# Patient Record
Sex: Female | Born: 1945
Health system: Southern US, Community
[De-identification: ages and names within clinical notes are randomized; demographics above are authoritative.]

## PROBLEM LIST (undated history)

## (undated) DIAGNOSIS — R519 Headache, unspecified: Secondary | ICD-10-CM

## (undated) DIAGNOSIS — M109 Gout, unspecified: Secondary | ICD-10-CM

## (undated) DIAGNOSIS — D649 Anemia, unspecified: Secondary | ICD-10-CM

## (undated) DIAGNOSIS — R51 Headache: Secondary | ICD-10-CM

## (undated) DIAGNOSIS — F319 Bipolar disorder, unspecified: Secondary | ICD-10-CM

## (undated) DIAGNOSIS — R0600 Dyspnea, unspecified: Secondary | ICD-10-CM

## (undated) DIAGNOSIS — G4700 Insomnia, unspecified: Secondary | ICD-10-CM

## (undated) DIAGNOSIS — H04123 Dry eye syndrome of bilateral lacrimal glands: Secondary | ICD-10-CM

## (undated) DIAGNOSIS — M1990 Unspecified osteoarthritis, unspecified site: Secondary | ICD-10-CM

## (undated) DIAGNOSIS — N8110 Cystocele, unspecified: Secondary | ICD-10-CM

## (undated) DIAGNOSIS — M48062 Spinal stenosis, lumbar region with neurogenic claudication: Secondary | ICD-10-CM

## (undated) DIAGNOSIS — F329 Major depressive disorder, single episode, unspecified: Secondary | ICD-10-CM

## (undated) DIAGNOSIS — J309 Allergic rhinitis, unspecified: Secondary | ICD-10-CM

## (undated) DIAGNOSIS — Z9682 Presence of neurostimulator: Secondary | ICD-10-CM

## (undated) DIAGNOSIS — I10 Essential (primary) hypertension: Secondary | ICD-10-CM

## (undated) DIAGNOSIS — J189 Pneumonia, unspecified organism: Secondary | ICD-10-CM

## (undated) DIAGNOSIS — Z8709 Personal history of other diseases of the respiratory system: Secondary | ICD-10-CM

## (undated) DIAGNOSIS — M8580 Other specified disorders of bone density and structure, unspecified site: Secondary | ICD-10-CM

## (undated) DIAGNOSIS — J45909 Unspecified asthma, uncomplicated: Secondary | ICD-10-CM

## (undated) DIAGNOSIS — I2510 Atherosclerotic heart disease of native coronary artery without angina pectoris: Secondary | ICD-10-CM

## (undated) DIAGNOSIS — Z62819 Personal history of unspecified abuse in childhood: Secondary | ICD-10-CM

## (undated) DIAGNOSIS — M47816 Spondylosis without myelopathy or radiculopathy, lumbar region: Secondary | ICD-10-CM

## (undated) DIAGNOSIS — N390 Urinary tract infection, site not specified: Secondary | ICD-10-CM

## (undated) DIAGNOSIS — M4310 Spondylolisthesis, site unspecified: Secondary | ICD-10-CM

## (undated) DIAGNOSIS — G2581 Restless legs syndrome: Secondary | ICD-10-CM

## (undated) DIAGNOSIS — N6019 Diffuse cystic mastopathy of unspecified breast: Secondary | ICD-10-CM

## (undated) DIAGNOSIS — K219 Gastro-esophageal reflux disease without esophagitis: Secondary | ICD-10-CM

## (undated) DIAGNOSIS — Z201 Contact with and (suspected) exposure to tuberculosis: Secondary | ICD-10-CM

## (undated) DIAGNOSIS — J849 Interstitial pulmonary disease, unspecified: Secondary | ICD-10-CM

## (undated) DIAGNOSIS — Z8719 Personal history of other diseases of the digestive system: Secondary | ICD-10-CM

## (undated) DIAGNOSIS — G9341 Metabolic encephalopathy: Secondary | ICD-10-CM

## (undated) DIAGNOSIS — G8918 Other acute postprocedural pain: Secondary | ICD-10-CM

## (undated) DIAGNOSIS — F419 Anxiety disorder, unspecified: Secondary | ICD-10-CM

## (undated) DIAGNOSIS — N952 Postmenopausal atrophic vaginitis: Secondary | ICD-10-CM

## (undated) DIAGNOSIS — E785 Hyperlipidemia, unspecified: Secondary | ICD-10-CM

## (undated) DIAGNOSIS — J449 Chronic obstructive pulmonary disease, unspecified: Secondary | ICD-10-CM

## (undated) DIAGNOSIS — M51369 Other intervertebral disc degeneration, lumbar region without mention of lumbar back pain or lower extremity pain: Secondary | ICD-10-CM

## (undated) DIAGNOSIS — N814 Uterovaginal prolapse, unspecified: Secondary | ICD-10-CM

## (undated) DIAGNOSIS — R1312 Dysphagia, oropharyngeal phase: Secondary | ICD-10-CM

## (undated) DIAGNOSIS — E039 Hypothyroidism, unspecified: Secondary | ICD-10-CM

## (undated) DIAGNOSIS — E46 Unspecified protein-calorie malnutrition: Secondary | ICD-10-CM

## (undated) DIAGNOSIS — C801 Malignant (primary) neoplasm, unspecified: Secondary | ICD-10-CM

## (undated) DIAGNOSIS — F32A Depression, unspecified: Secondary | ICD-10-CM

## (undated) DIAGNOSIS — E8809 Other disorders of plasma-protein metabolism, not elsewhere classified: Secondary | ICD-10-CM

## (undated) HISTORY — PX: JOINT REPLACEMENT: SHX530

## (undated) HISTORY — DX: Personal history of unspecified abuse in childhood: Z62.819

## (undated) HISTORY — DX: Personal history of other diseases of the respiratory system: Z87.09

## (undated) HISTORY — DX: Cystocele, unspecified: N81.10

## (undated) HISTORY — DX: Hyperlipidemia, unspecified: E78.5

## (undated) HISTORY — DX: Gout, unspecified: M10.9

## (undated) HISTORY — PX: SPINAL CORD STIMULATOR IMPLANT: SHX2422

## (undated) HISTORY — DX: Contact with and (suspected) exposure to tuberculosis: Z20.1

## (undated) HISTORY — PX: KNEE SURGERY: SHX244

## (undated) HISTORY — PX: BREAST RECONSTRUCTION: SHX9

## (undated) HISTORY — DX: Other acute postprocedural pain: G89.18

## (undated) HISTORY — DX: Urinary tract infection, site not specified: N39.0

## (undated) HISTORY — DX: Essential (primary) hypertension: I10

## (undated) HISTORY — DX: Restless legs syndrome: G25.81

## (undated) HISTORY — DX: Malignant (primary) neoplasm, unspecified: C80.1

---

## 1973-01-28 HISTORY — PX: VAGINAL HYSTERECTOMY: SUR661

## 1976-01-29 HISTORY — PX: AUGMENTATION MAMMAPLASTY: SUR837

## 1976-01-29 HISTORY — PX: OOPHORECTOMY: SHX86

## 1976-01-29 HISTORY — PX: MASTECTOMY PARTIAL / LUMPECTOMY: SUR851

## 1987-01-29 HISTORY — PX: CHOLECYSTECTOMY: SHX55

## 1988-01-29 HISTORY — PX: ELBOW ARTHROPLASTY: SHX928

## 1997-11-02 ENCOUNTER — Ambulatory Visit (HOSPITAL_COMMUNITY): Admission: RE | Admit: 1997-11-02 | Discharge: 1997-11-02 | Payer: Self-pay | Admitting: Internal Medicine

## 1999-01-29 HISTORY — PX: INCONTINENCE SURGERY: SHX676

## 1999-11-07 ENCOUNTER — Ambulatory Visit (HOSPITAL_COMMUNITY): Admission: RE | Admit: 1999-11-07 | Discharge: 1999-11-07 | Payer: Self-pay | Admitting: Internal Medicine

## 1999-11-07 ENCOUNTER — Encounter: Payer: Self-pay | Admitting: Internal Medicine

## 2000-11-19 ENCOUNTER — Ambulatory Visit (HOSPITAL_COMMUNITY): Admission: RE | Admit: 2000-11-19 | Discharge: 2000-11-19 | Payer: Self-pay | Admitting: Internal Medicine

## 2000-11-19 ENCOUNTER — Encounter: Payer: Self-pay | Admitting: Internal Medicine

## 2002-01-08 ENCOUNTER — Ambulatory Visit (HOSPITAL_COMMUNITY): Admission: RE | Admit: 2002-01-08 | Discharge: 2002-01-08 | Payer: Self-pay | Admitting: Internal Medicine

## 2002-01-08 ENCOUNTER — Encounter: Payer: Self-pay | Admitting: Internal Medicine

## 2003-08-11 ENCOUNTER — Ambulatory Visit (HOSPITAL_COMMUNITY): Admission: RE | Admit: 2003-08-11 | Discharge: 2003-08-11 | Payer: Self-pay | Admitting: Internal Medicine

## 2004-03-14 ENCOUNTER — Ambulatory Visit (HOSPITAL_COMMUNITY): Admission: RE | Admit: 2004-03-14 | Discharge: 2004-03-14 | Payer: Self-pay | Admitting: Internal Medicine

## 2004-03-29 ENCOUNTER — Ambulatory Visit: Payer: Self-pay | Admitting: Gastroenterology

## 2004-04-11 ENCOUNTER — Ambulatory Visit: Payer: Self-pay | Admitting: Gastroenterology

## 2005-04-05 ENCOUNTER — Ambulatory Visit (HOSPITAL_COMMUNITY): Admission: RE | Admit: 2005-04-05 | Discharge: 2005-04-05 | Payer: Self-pay | Admitting: Internal Medicine

## 2005-06-19 ENCOUNTER — Ambulatory Visit: Payer: Self-pay

## 2005-07-26 ENCOUNTER — Ambulatory Visit: Payer: Self-pay | Admitting: Family

## 2005-10-31 ENCOUNTER — Encounter: Admission: RE | Admit: 2005-10-31 | Discharge: 2005-10-31 | Payer: Self-pay | Admitting: Neurosurgery

## 2005-11-08 ENCOUNTER — Ambulatory Visit (HOSPITAL_COMMUNITY): Admission: RE | Admit: 2005-11-08 | Discharge: 2005-11-09 | Payer: Self-pay | Admitting: Neurosurgery

## 2006-03-10 ENCOUNTER — Ambulatory Visit: Payer: Self-pay | Admitting: Pain Medicine

## 2006-03-11 ENCOUNTER — Ambulatory Visit: Payer: Self-pay | Admitting: Pain Medicine

## 2006-03-25 ENCOUNTER — Ambulatory Visit: Payer: Self-pay | Admitting: Physician Assistant

## 2006-05-09 ENCOUNTER — Ambulatory Visit (HOSPITAL_COMMUNITY): Admission: RE | Admit: 2006-05-09 | Discharge: 2006-05-09 | Payer: Self-pay | Admitting: Family Medicine

## 2006-12-16 ENCOUNTER — Ambulatory Visit: Payer: Self-pay | Admitting: Pain Medicine

## 2006-12-31 ENCOUNTER — Ambulatory Visit: Payer: Self-pay | Admitting: Pain Medicine

## 2007-01-05 ENCOUNTER — Ambulatory Visit: Payer: Self-pay | Admitting: Pain Medicine

## 2007-01-08 ENCOUNTER — Ambulatory Visit: Payer: Self-pay | Admitting: Pain Medicine

## 2007-01-09 ENCOUNTER — Ambulatory Visit: Payer: Self-pay | Admitting: Pain Medicine

## 2007-01-29 HISTORY — PX: RECTOCELE REPAIR: SHX761

## 2007-02-02 ENCOUNTER — Ambulatory Visit: Payer: Self-pay | Admitting: Physician Assistant

## 2007-03-18 ENCOUNTER — Ambulatory Visit: Payer: Self-pay | Admitting: Physician Assistant

## 2007-03-31 ENCOUNTER — Ambulatory Visit: Payer: Self-pay | Admitting: Pain Medicine

## 2007-04-14 ENCOUNTER — Ambulatory Visit: Payer: Self-pay | Admitting: Physician Assistant

## 2007-08-11 ENCOUNTER — Ambulatory Visit: Payer: Self-pay | Admitting: Family Medicine

## 2007-08-17 ENCOUNTER — Emergency Department: Payer: Self-pay | Admitting: Emergency Medicine

## 2007-08-17 ENCOUNTER — Other Ambulatory Visit: Payer: Self-pay

## 2007-09-02 ENCOUNTER — Ambulatory Visit: Payer: Self-pay | Admitting: Family Medicine

## 2007-09-09 ENCOUNTER — Ambulatory Visit: Payer: Self-pay | Admitting: Obstetrics & Gynecology

## 2007-10-06 ENCOUNTER — Ambulatory Visit: Payer: Self-pay | Admitting: Gynecology

## 2007-10-21 ENCOUNTER — Ambulatory Visit: Payer: Self-pay | Admitting: Gynecology

## 2007-10-27 ENCOUNTER — Ambulatory Visit: Payer: Self-pay | Admitting: Obstetrics & Gynecology

## 2007-10-27 ENCOUNTER — Inpatient Hospital Stay (HOSPITAL_COMMUNITY): Admission: RE | Admit: 2007-10-27 | Discharge: 2007-10-28 | Payer: Self-pay | Admitting: Obstetrics & Gynecology

## 2007-11-17 ENCOUNTER — Ambulatory Visit: Payer: Self-pay | Admitting: Obstetrics & Gynecology

## 2007-12-22 ENCOUNTER — Ambulatory Visit: Payer: Self-pay | Admitting: Obstetrics & Gynecology

## 2008-04-04 ENCOUNTER — Ambulatory Visit: Payer: Self-pay | Admitting: Pain Medicine

## 2008-04-19 ENCOUNTER — Ambulatory Visit: Payer: Self-pay | Admitting: Pain Medicine

## 2008-05-05 ENCOUNTER — Ambulatory Visit: Payer: Self-pay | Admitting: Physician Assistant

## 2008-06-09 ENCOUNTER — Ambulatory Visit: Payer: Self-pay | Admitting: Physician Assistant

## 2008-06-30 ENCOUNTER — Ambulatory Visit: Payer: Self-pay | Admitting: Pain Medicine

## 2008-08-03 ENCOUNTER — Ambulatory Visit: Payer: Self-pay | Admitting: Physician Assistant

## 2008-08-16 ENCOUNTER — Ambulatory Visit: Payer: Self-pay | Admitting: Family Medicine

## 2008-08-18 ENCOUNTER — Ambulatory Visit: Payer: Self-pay | Admitting: Physician Assistant

## 2008-08-29 ENCOUNTER — Ambulatory Visit: Payer: Self-pay | Admitting: Family Medicine

## 2008-09-06 ENCOUNTER — Ambulatory Visit: Payer: Self-pay | Admitting: Physician Assistant

## 2008-09-15 ENCOUNTER — Ambulatory Visit: Payer: Self-pay | Admitting: Physician Assistant

## 2008-09-21 ENCOUNTER — Ambulatory Visit: Payer: Self-pay | Admitting: Family Medicine

## 2008-10-18 ENCOUNTER — Ambulatory Visit: Payer: Self-pay | Admitting: Pain Medicine

## 2008-11-03 ENCOUNTER — Ambulatory Visit: Payer: Self-pay | Admitting: Physician Assistant

## 2009-02-09 ENCOUNTER — Ambulatory Visit: Payer: Self-pay | Admitting: Physician Assistant

## 2009-03-03 ENCOUNTER — Encounter (INDEPENDENT_AMBULATORY_CARE_PROVIDER_SITE_OTHER): Payer: Self-pay | Admitting: *Deleted

## 2009-05-16 ENCOUNTER — Ambulatory Visit: Payer: Self-pay | Admitting: Pain Medicine

## 2009-06-09 ENCOUNTER — Ambulatory Visit: Payer: Self-pay | Admitting: Pain Medicine

## 2009-07-10 ENCOUNTER — Ambulatory Visit: Payer: Self-pay | Admitting: Pain Medicine

## 2009-07-18 ENCOUNTER — Ambulatory Visit: Payer: Self-pay | Admitting: Pain Medicine

## 2009-08-09 ENCOUNTER — Ambulatory Visit: Payer: Self-pay | Admitting: Pain Medicine

## 2009-08-22 ENCOUNTER — Ambulatory Visit: Payer: Self-pay | Admitting: Pain Medicine

## 2009-09-01 ENCOUNTER — Ambulatory Visit: Payer: Self-pay | Admitting: Family Medicine

## 2009-09-18 ENCOUNTER — Ambulatory Visit: Payer: Self-pay | Admitting: Pain Medicine

## 2009-10-26 ENCOUNTER — Ambulatory Visit: Payer: Self-pay | Admitting: Pain Medicine

## 2009-11-01 ENCOUNTER — Telehealth: Payer: Self-pay | Admitting: Gastroenterology

## 2009-11-06 ENCOUNTER — Ambulatory Visit: Payer: Self-pay | Admitting: Pain Medicine

## 2009-11-14 ENCOUNTER — Ambulatory Visit: Payer: Self-pay | Admitting: Pain Medicine

## 2009-12-25 ENCOUNTER — Ambulatory Visit: Payer: Self-pay | Admitting: Pain Medicine

## 2009-12-28 ENCOUNTER — Ambulatory Visit: Payer: Self-pay | Admitting: Pain Medicine

## 2010-02-05 ENCOUNTER — Ambulatory Visit: Payer: Self-pay | Admitting: Pain Medicine

## 2010-03-01 NOTE — Letter (Signed)
Summary: Colonoscopy Letter  [facility]  [address] [place], [place] [address]   Phone: [phone]  Fax: [phone]      March 03, 2009 MRN: [id number]   JOEE IOVINE [address] [place], [place]  [address]   Dear Ms. Grieves,   According to your medical record, it is time for you to schedule a Colonoscopy. The American Cancer Society recommends this procedure as a method to detect early colon cancer. Patients with a family history of colon cancer, or a personal history of colon polyps or inflammatory bowel disease are at increased risk.  This letter has beeen generated based on the recommendations made at the time of your procedure. If you feel that in your particular situation this may no longer apply, please contact our office.  Please call our office at [phone] to schedule this appointment or to update your records at your earliest convenience.  Thank you for cooperating with [place] to provide you with the very best care possible.   Sincerely,  Vania Rea. Jarold Motto, M.D.  [facility] [phone]

## 2010-03-01 NOTE — Progress Notes (Signed)
Summary: Schedule Colonoscopy  Phone Note Outgoing Call Call back at Home Phone [phone]   Call placed by: Harlow Mares CMA Duncan Dull),  November 01, 2009 11:12 AM Call placed to: Patient Summary of Call: patient is aware she needs a colonoscopy but she just had back surgery and she took down the number to call back and schedule her colonoscopy.  Initial call taken by: Harlow Mares CMA (AAMA),  November 01, 2009 11:13 AM

## 2010-04-23 ENCOUNTER — Ambulatory Visit: Payer: Self-pay | Admitting: Pain Medicine

## 2010-05-02 ENCOUNTER — Ambulatory Visit: Payer: Self-pay | Admitting: Pain Medicine

## 2010-05-29 ENCOUNTER — Ambulatory Visit: Payer: Self-pay | Admitting: Pain Medicine

## 2010-06-12 NOTE — Op Note (Signed)
[name], [name] NO.:  [id number]   MEDICAL RECORD NO.:  [id number]          PATIENT TYPE:  POB   LOCATION:  [facility]                          FACILITY:  [facility]   PHYSICIAN:  Norton Blizzard, MD    DATE OF BIRTH:  03-10-1945   DATE OF PROCEDURE:  10/27/2007  DATE OF DISCHARGE:                               OPERATIVE REPORT   PREOPERATIVE DIAGNOSIS:  Symptomatic rectocele.   POSTOPERATIVE DIAGNOSIS:  Symptomatic rectocele.   PROCEDURE:  Posterior colporrhaphy.   SURGEON:  Norton Blizzard, MD   ASSISTANT:  Allie Bossier, MD   ANESTHESIA:  General.   IV FLUIDS:  2000 mL of lactated Ringer's.   ESTIMATED BLOOD LOSS:  50 mL   URINE OUTPUT:  Minimal.   INDICATIONS:  The patient is a 65 year old gravida 2, para 2 status post  total hysterectomy and TVT who presented with symptomatic rectocele.  On  examination, the patient was shown to have grade 2 distal rectocele.  The patient was counseled regarding the need for posterior repair.  Before surgery, the risks of operation including bleeding, infection,  injury to rectum or surrounding organs, possible need for additional  procedures, risks of graft erosion or rejection or infection if graft  was used and possible recurrence of the rectocele were also discussed  with the patient and written informed consent was obtained.   FINDINGS:  Grade 2 rectocele, otherwise normal vaginal mucosa, well-  healed vaginal cuff, no cystocele noted, strong perineal body, which  alleviated the need for a perineorrhaphy.   SPECIMENS:  None.   COMPLICATIONS:  None.   PROCEDURE DETAILS:  The patient received preoperative IV antibiotics  prior to the procedure.  She was then taken to the operating room where  general anesthesia was administered, and the patient was placed in a  dorsal lithotomy position.  She was then prepped and draped in a sterile  manner.  Attention was then turned to the patient's vagina where Allis  clamps  were placed on both sides of the hymen.  A solution using 30 mL  of 1% lidocaine with epinephrine mixed with 30 mL of normal saline was  then injected below the vaginal mucosa to aide with hydrodissection and  hemostasis.  At this point, a transverse incision was made into the  vaginal mucosa at the level of the hymen.  Metzenbaum scissors were then  used to dissect underneath the vaginal epithelium going past the defect  of the rectocele towards the vaginal cuff apex.  The vaginal epithelium  was opened in the midline and extended to that area superior to the  defect.  The vaginal epithelium was dissected bilaterally away from the  underlying fibromuscularis layer until the levator muscles were reached  on both sides.  The fibromuscularis layer wall was then plicated in the  midline with interrupted 3-0 Vicryl sutures.  This plication was started  at the apex and progressed towards the introitus.  Rectal exam was done,  and there was no residual weakness, evidence of rectal injury or suture  in the rectal mucosa.  The vaginal epithelium was then  trimmed about 5-7  mm on both sides, and the vaginal epithelium was then reapproximated  with a running interlocking 2-0 Vicryl stitch.  The patient did not need  a peritoneopathy, so this was not done.  There was excellent hemostasis  at the end of the case.  The vagina was then irrigated copiously with  normal saline,  a vaginal packing that was infused with estradiol cream  was placed, and the  Foley catheter that was placed in preoperatively  was kept in place  and will remain in place until tomorrow.  The patient  tolerated the procedure well.  Sponge, instrument, and needle counts  were correct x 3.  She was taken to the recovery room awake, extubated,  in a stable condition.      Norton Blizzard, MD  Electronically Signed     UAD/MEDQ  D:  10/27/2007  T:  10/28/2007  Job:  [id number]
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
## 2010-06-12 NOTE — Assessment & Plan Note (Signed)
NAME:  ILAISAANE, MARTS NO.:  [id number]   MEDICAL RECORD NO.:  [id number]          PATIENT TYPE:  POB   LOCATION:  [facility]         FACILITY:  [facility]   PHYSICIAN:  Ginger Carne, MD DATE OF BIRTH:  06-24-1945   DATE OF SERVICE:  10/21/2007                                  CLINIC NOTE   Ms. Mayon returns today for followup visit.  She is scheduled to  have asymptomatic rectocele repair at in the near future.  She had  reported to my nurse that at time she has difficulty holding stool after  she eats certain types of foods including [place] food when she has pure  diarrhea.  She has difficulty in making it to the restroom in time.  Typically, she has no probable holding solid stool, flatus, or slight  diarrhea.  The patient reports no other specific symptoms related to the  lower extremities including paresthesias, weakness, or other complaints.   SALIENT PHYSICAL FINDINGS:  S2, S3, S4 reflexes intact.  There is good  rectal tone when the patient is asked to squeeze her rectal muscles and  the patient has good resting tone when the patient is asked not to  voluntarily contract her external sphincter muscle.   IMPRESSION AND PLAN:  At this point, I believe the patient does not have  primary or secondary fecal incontinence and we will proceed with the  rectocele repair as already scheduled.           ______________________________  Ginger Carne, MD     SHB/MEDQ  D:  10/21/2007  T:  10/21/2007  Job:  [id number]

## 2010-06-12 NOTE — Discharge Summary (Signed)
[name], [name] NO.:  [id number]   MEDICAL RECORD NO.:  [id number]          PATIENT TYPE:  INP   LOCATION:  9315                          [facility]ACILITY[facility]   PHYSICIAN:  Norton Blizzard, MD    DATE OF BIRTH:  1945/12/03   DATE OF ADMISSION:  10/27/2007  DATE OF DISCHARGE:  10/28/2007                               DISCHARGE SUMMARY   ADMISSION DIAGNOSIS:  Symptomatic rectocele.   DISCHARGE DIAGNOSIS:  Status post posterior colporrhaphy.   PROCEDURE:  Posterior colporrhaphy done on October 27, 2007.   BRIEF HOSPITAL COURSE:  The patient is a 65 year old gravida 2, para 2  status post total hysterectomy and bladder sling who presented with a  symptomatic rectocele.  The patient  opted for surgical management.  She  underwent an uncomplicated posterior colporrhaphy on October 27, 2007.  For further details of this operation, please refer to the separate  dictated operative report.  The patient was kept overnight for  observation.  On postoperative day #1, the patient was voiding  spontaneously and had minimal bleeding from the operative site.  She was  tolerating regular diet, passing flatus, and ambulating without  difficulty.  The patient was deemed stable for discharge to home.   DISCHARGE CONDITION:  Stable.   DISCHARGE MEDICATIONS:  1. Percocet 5/325 mg 1-2 tabs p.o. q.6h. p.r.n. pain.  2. Colace 100 mg p.o. b.i.d. p.r.n. constipation.  3. Ibuprofen 600 mg p.o. q.6h. p.r.n. pain.  The patient is also told to resume her various home medications.   DISCHARGE INSTRUCTIONS:  The patient was told to avoid anything in the  vagina for 6 weeks.  She was also told to avoid heavy lifting more than  anything heavier than 15 pounds for the next 6 weeks.  The patient is to  not drive while she is on Percocet.  She is to increase her activity  slowly.  The patient was also told to avoid constipating foods and to  take her Senna and Colace as prescribed to help  her to have soft stools  and avoid straining during bowel movements.  The patient was told that  she could shower but to avoid having baths or long soaks in the bathtub  for the next month.  She was advised to call for increased bleeding,  fevers, abnormal vaginal drainage, or any other concerning symptoms.   FOLLOWUP APPOINTMENTS:  The patient will call the [facility] to  make a followup appointment in 4 weeks.     Norton Blizzard, MD  Electronically Signed    UAD/MEDQ  D:  10/28/2007  T:  10/28/2007  Job:  [id number]

## 2010-06-12 NOTE — Assessment & Plan Note (Signed)
NAME:  [name], [name] NO.:  [id number]   MEDICAL RECORD NO.:  [id number]          PATIENT TYPE:  POB   LOCATION:  [facility]         FACILITY:  [facility]   PHYSICIAN:  Johnella Moloney, MD        DATE OF BIRTH:  Jun 12, 1945   DATE OF SERVICE:  12/22/2007                                  CLINIC NOTE   The patient is a 65 year old gravida 2, para 2 status post posterior  colporrhaphy for symptomatic rectocele on October 27, 2007.  She is  here for her postoperative evaluation.  The patient was last seen in  November 17, 2007, at which point, she reported having normal bowel  movements and no difficulty holding stool or making it to the restroom  on time.  At this visit, she reports no other symptoms.  She does want  to know if it is safe to resume sexual intercourse.  She is using the  estrogen cream as prescribed.   PHYSICAL EXAMINATION:  The patient is afebrile.  Vital signs are stable.  She is in no apparent distress.  On pelvic examination, the patient is  noted to have normal external female genitalia.  Pink, well-rugated  vagina.  No fissures were visualized on the rectocele repair site and it  is well healed.  No abnormal drain is seen.  There is not good posterior  support appreciated on exams and no defects in the repair area.   IMPRESSION AND PLAN:  The patient is a 65 year old here for  postoperative check.  She was told that based on her examination that it  is okay for her to resume sexual intercourse.  However, we recommended  continuing the Premarin cream, as it has restored rugae to her vaginal  area and caused rejuvenation of her vaginal vault.  She was also told to  use lubrication when she is having sexual intercourse.  She was told to  call or come to the emergency room if she has any uncontrollable pain or  any other symptoms for which she feels she needs evaluation.  The  patient is not scheduled to come back until July 2010 for her annual  examination, and at that visit her annual mammogram will also be  ordered.           ______________________________  Johnella Moloney, MD     UD/MEDQ  D:  12/22/2007  T:  12/23/2007  Job:  [id number]

## 2010-06-12 NOTE — Assessment & Plan Note (Signed)
NAME:  DALIS, BEERS NO.:  [id number]   MEDICAL RECORD NO.:  [id number]          PATIENT TYPE:  POB   LOCATION:  [facility]         FACILITY:  [facility]   PHYSICIAN:  Johnella Moloney, MD        DATE OF BIRTH:  May 15, 1945   DATE OF SERVICE:  11/17/2007                                  CLINIC NOTE   The patient is a 65 year old gravida 2, para 2 status post posterior  colporrhaphy in October 27, 2007, for symptomatic rectocele.  The  patient had an uncomplicated postoperative course.  Since the surgery,  she reports having normal bowel movements and no difficulty holding  stool or difficulty making it to the restroom on time.  She reports no  other symptoms apart from some brown vaginal discharge.  She is using  the estrogen cream as prescribed.  The patient has no other concerns or  complaints.   PHYSICAL EXAMINATION:  VITAL SIGNS:  Blood pressure is 127/85, pulse 82,  weight 165 pounds, height 5 feet 2 inches.  GENERAL:  No apparent distress.  ABDOMEN:  Soft, nontender, nondistended.  PELVIS:  Normal external female genitalia.  Speculum examination pink,  well-rugated vagina.  Sutures still noted to be in place on the  rectocele repair, a well-healing rectocele repair.  No abnormal drainage  seen.  Good posterior support appreciated on examination.  No defects in  the repair area palpated.   IMPRESSION AND PLAN:  The patient is a 65 year old here for her  postoperative check.  She already reports that her bowel movements are  much better than before the surgery.  She does not have any secondary  fecal incontinence.  The patient will continue to be observed and she  will come back in about 6 weeks for reevaluation of her symptoms.           ______________________________  Johnella Moloney, MD     UD/MEDQ  D:  11/17/2007  T:  11/18/2007  Job:  [id number]

## 2010-06-12 NOTE — Assessment & Plan Note (Signed)
NAME:  [name], [name] NO.:  [id number]   MEDICAL RECORD NO.:  [id number]          PATIENT TYPE:  POB   LOCATION:  [facility]         FACILITY:  [facility]   PHYSICIAN:  Ginger Carne, MD DATE OF BIRTH:  [birth year]   DATE OF SERVICE:                                  CLINIC NOTE   Ms. Mikaelian is a 65 year old multiparous female who is here following  evaluation by Dr. Silas Flood on September 09, 2007, because of the symptomatic  bulging of her vagina.  She has had a previous hysterectomy and a TVT  repair for genuine urinary stress incontinence with attendant anterior  colporrhaphy.  The patient states that she needs to splint when she has  a bowel movement.  She has been taking stool softeners, but still finds  having bowels to be difficult to express.  She complains of pain with  her bowel movements in addition to discomfort with bulging.  Her past  medical and surgical history is well documented on Dr. Jasmine December note from  September 09, 2007.   SALIENT PHYSICAL FINDINGS:  External genitalia, vulva, and vagina  reveals a third-degree distal rectocele which appears from the mid  portion of the posterior vagina to the introitus suggesting level 2 and  3 defect.  The anterior compartment is well supported as is the cuff.  The patient has good tone on voluntary contraction as well as resting  tone of the rectum.  She has no evidence for urinary stress incontinence  on coughing.  Rectovaginal exam was confirmatory of said findings.   IMPRESSION:  Third-degree rectocele.   PLAN:  The patient will undergo a posterior colporrhaphy with possible  graft insertion.  Ashby Dawes of said procedure discussed in detail including  risks involving hemorrhage, postoperative infection, graft erosion,  rejection, or infection if utilized, and possible recurrence.           ______________________________  Ginger Carne, MD     SHB/MEDQ  D:  10/06/2007  T:  10/07/2007  Job:   [id number]

## 2010-06-15 NOTE — Op Note (Signed)
NAME:  Sharon, [name] NO.:  [id number]   MEDICAL RECORD NO.:  [id number]          PATIENT TYPE:  AMB   LOCATION:  SDS                          FACILITY:  [facility]   PHYSICIAN:  Donalee Citrin, M.D.        DATE OF BIRTH:  April 13, 1945   DATE OF PROCEDURE:  11/08/2005  DATE OF DISCHARGE:                                 OPERATIVE REPORT   PREOPERATIVE DIAGNOSIS:  Right L5 radiculopathy from what appears to be  synovial cyst L4-5 right.   POSTOPERATIVE DIAGNOSIS:  Right L5 radiculopathy from what appears to be  synovial cyst L4-5 right.   OPERATION PERFORMED:  Decompressive lumbar laminectomy, L4-5 right with  microscopic dissection of a right L4 and L5 nerve roots and microscopic  resection of synovial cyst.   SURGEON:  Donalee Citrin, M.D.   ASSISTANT:  Tia Alert, MD   ANESTHESIA:  General endotracheal.   INDICATIONS FOR PROCEDURE:  The patient is a very pleasant 65 year old  female who has had longstanding back and right leg pain radiating down the  back of her thigh to top of her food and big toe consistent with L5 nerve  root pattern.  Preoperative imaging showed what appeared to be either  osteophyte or synovial cyst coming off the medial aspect of the facet  displacing the L5 up to the L4 nerve root.  The patient failed all forms of  conservative treatment and was recommended laminectomy and excision of  synovial cyst.  Risks and benefits of the operation were explained to the  patient.  She understands and agreed to proceed forward.   DESCRIPTION OF PROCEDURE:  The patient was brought to the operating room was  induced under general anesthesia, placed prone on Wilson frame, back prepped  and draped in the usual sterile fashion.  Preoperative imaging localized the  L4-5 disk space.  After infiltration of 10 mL of lidocaine with epinephrine,  a midline incision was made and Bovie electrocautery was used to take down  subcutaneous tissue.  Subperiosteal  dissection was carried out to lamina of  L4 and L5 on the right.  Intraoperative x-ray confirmed location of the  appropriate level at L4-5 and then using a high speed drill, the inferior  aspect of L4, medial facet complex superior aspect of L5 was drilled down.  Then using 2 and 3 mm Kerrison punches, virtually complete decompressive  laminectomy at L4 was performed on the right side and medial facetectomy the  cyst was immediately palpated with a 4 Penfield displacement of thecal sac  and proximal L5 nerve root medially.  Plane was developed from the dura  along the medial border of the cyst.  The L5 pedicle was identified.  The L5  nerve root was dissected off the pedicle and working from inferior  superiorly, the undersurface of the facet was underbitten and the cyst was  excised.  There was noted to be no free fluid.  It appeared to be all  degenerated cottage cheese texture.  This was marked up all the way to the  undersurface of the axilla of the 4 root  so the laminotomy had to be  extended superiorly and the proximal L4 nerve root was identified and the L4  pedicle was identified.  Marked hypertrophied ligament overlying the L4  nerve root and remnants of the superior aspect of synovial cyst were all  dissected with a blunt nerve hook overlying the L4 nerve root and the L4  nerve root was decompressed out its foramen to further allow excision of the  synovial cyst. At the end of the cystectomy there was no further pressure on  either the 4 or the 5 root.  The thecal sac was relaxed.  The epidural veins  were coagulated, the disk was noted to be in normal position.  The wound was  then copiously irrigated and meticulous hemostasis was maintained.  Gelfoam  was overlaid on top of the dura.  The wound was  closed in layers with interrupted Vicryl and the skin was closed with  running 4-0 subcuticular.  Benzoin and Steri-Strips were applied.  The  patient went to the recovery room in  stable condition.  At the end of the  case all needle counts, sponge counts correct.           ______________________________  Donalee Citrin, M.D.     GC/MEDQ  D:  11/08/2005  T:  11/11/2005  Job:  [id number]

## 2010-06-18 ENCOUNTER — Ambulatory Visit: Payer: Self-pay | Admitting: Pain Medicine

## 2010-06-19 ENCOUNTER — Ambulatory Visit: Payer: Self-pay | Admitting: Pain Medicine

## 2010-06-22 ENCOUNTER — Ambulatory Visit: Payer: Self-pay | Admitting: Family Medicine

## 2010-07-05 ENCOUNTER — Ambulatory Visit: Payer: Self-pay | Admitting: Pain Medicine

## 2010-07-16 ENCOUNTER — Ambulatory Visit: Payer: Self-pay | Admitting: Pain Medicine

## 2010-07-19 ENCOUNTER — Ambulatory Visit: Payer: Self-pay | Admitting: Pain Medicine

## 2010-08-06 ENCOUNTER — Ambulatory Visit: Payer: Self-pay | Admitting: Pain Medicine

## 2010-08-16 ENCOUNTER — Ambulatory Visit: Payer: Self-pay | Admitting: Pain Medicine

## 2010-09-11 ENCOUNTER — Ambulatory Visit: Payer: Self-pay | Admitting: Family Medicine

## 2010-09-17 ENCOUNTER — Ambulatory Visit: Payer: Self-pay | Admitting: Pain Medicine

## 2010-10-29 ENCOUNTER — Ambulatory Visit: Payer: Self-pay | Admitting: Pain Medicine

## 2010-10-29 LAB — DIFFERENTIAL
Basophils Absolute: 0
Basophils Relative: 1
Eosinophils Absolute: 0.2
Eosinophils Relative: 3
Monocytes Relative: 8

## 2010-10-29 LAB — COMPREHENSIVE METABOLIC PANEL
Alkaline Phosphatase: 72
BUN: 12
CO2: 31
Calcium: 9.1
Chloride: 99
Creatinine, Ser: 0.67
GFR calc Af Amer: >60
Glucose, Bld: 82
Potassium: 3.3 — ABNORMAL LOW
Total Bilirubin: 0.4

## 2010-10-29 LAB — CBC
HCT: 33.8 — ABNORMAL LOW
MCHC: 34
Platelets: 227
RBC: 3.78 — ABNORMAL LOW
RDW: 12.9
WBC: 12 — ABNORMAL HIGH

## 2010-11-01 ENCOUNTER — Ambulatory Visit: Payer: Self-pay | Admitting: Pain Medicine

## 2010-11-13 ENCOUNTER — Ambulatory Visit: Payer: Self-pay | Admitting: Family Medicine

## 2010-11-15 ENCOUNTER — Encounter: Payer: Self-pay | Admitting: Gastroenterology

## 2010-11-19 ENCOUNTER — Ambulatory Visit: Payer: Self-pay | Admitting: Pain Medicine

## 2010-11-26 ENCOUNTER — Ambulatory Visit (AMBULATORY_SURGERY_CENTER): Payer: No Typology Code available for payment source

## 2010-11-26 VITALS — Ht 62.0 in | Wt 175.0 lb

## 2010-11-26 DIAGNOSIS — Z1211 Encounter for screening for malignant neoplasm of colon: Secondary | ICD-10-CM

## 2010-11-26 DIAGNOSIS — Z800 Family history of malignant neoplasm of digestive organs: Secondary | ICD-10-CM

## 2010-11-26 MED ORDER — PEG-KCL-NACL-NASULF-NA ASC-C 100 G PO SOLR: 1.0000 | Freq: Once | ORAL | Status: DC

## 2010-12-10 ENCOUNTER — Other Ambulatory Visit: Payer: Self-pay | Admitting: Gastroenterology

## 2010-12-10 ENCOUNTER — Ambulatory Visit (AMBULATORY_SURGERY_CENTER): Payer: No Typology Code available for payment source | Admitting: Gastroenterology

## 2010-12-10 ENCOUNTER — Encounter: Payer: Self-pay | Admitting: Gastroenterology

## 2010-12-10 DIAGNOSIS — D126 Benign neoplasm of colon, unspecified: Secondary | ICD-10-CM

## 2010-12-10 DIAGNOSIS — D1330 Benign neoplasm of unspecified part of small intestine: Secondary | ICD-10-CM

## 2010-12-10 DIAGNOSIS — Z800 Family history of malignant neoplasm of digestive organs: Secondary | ICD-10-CM

## 2010-12-10 DIAGNOSIS — Z1211 Encounter for screening for malignant neoplasm of colon: Secondary | ICD-10-CM

## 2010-12-10 MED ORDER — SODIUM CHLORIDE 0.9 % IV SOLN: 500.0000 mL | INTRAVENOUS | Status: DC

## 2010-12-10 NOTE — Progress Notes (Signed)
Verbal order given by Dr.Patterson for patient to hold her aspirin and all aspirin products for 2 weeks. Patient notified by MD and myself before discharge. Pt and pt's husband understands.

## 2010-12-10 NOTE — Progress Notes (Signed)
All meds titrated per md with procedure. Pt very anxious prior to procedure and didn't seem to relax through out entire procedure. Vitals stable. ewm

## 2010-12-10 NOTE — Patient Instructions (Signed)
PLEASE FOLLOW DISCHARGE INSTRUCTIONS GIVEN TODAY, SEE HANDOUTS. COLON POLYPS REMOVED TODAY AND SENT TO LAB. YOU WILL RECEIVE RESULT LETTER IN YOUR MAIL IN 1-2 WEEKS. HOLD ALL ASPIRIN, ASPIRIN CONTAINING PRODUCTS, ADVIL, ALEVE, ARTHRITIS MEDICATIONS FOR 2 WEEKS.  REPEAT COLONOSCOPY IN 1 YEAR. RESUME CURRENT MEDICATIONS EXCEPT ASPIRIN. CALL us WITH ANY QUESTIONS OR CONCERNS. WE WILL CALL YOU TOMORROW TO CHECK ON YOU. THANK YOU!

## 2010-12-11 ENCOUNTER — Telehealth: Payer: Self-pay | Admitting: *Deleted

## 2010-12-11 ENCOUNTER — Telehealth: Payer: Self-pay | Admitting: Internal Medicine

## 2010-12-11 NOTE — Telephone Encounter (Signed)
Left message to call us if necessary. 

## 2010-12-11 NOTE — Telephone Encounter (Signed)
Pt's husband called to state he is concerned about her mental status after procedure yesterday. He states he has a hard time describing the change, but she seems "off" to him.  He used the word "agitated" and "not herself".    I spoke with her by phone tonight.  She is alert and oriented x 4.  Is denying pain or other complaints.  She was able to drive today (her grandchild to school).    I spoke with husband again and advised that if he is worried then he should take her to the ED for eval.  I told him the safest move would be ED eval now. He voiced understanding and may take her to [facility] tonight for evaluation.  I will alert Dr. Jarold Motto and Graciella Freer, as the office will likely check in with patient tomorrow.

## 2010-12-11 NOTE — Telephone Encounter (Signed)
Follow up Call- Patient questions:  Do you have a fever, pain , or abdominal swelling? no Pain Score  0 *  Have you tolerated food without any problems? yes  Have you been able to return to your normal activities? yes  Do you have any questions about your discharge instructions: Diet   no Medications  no Follow up visit  no  Do you have questions or concerns about your Care? yes  Actions: * If pain score is 4 or above: No action needed, pain <4.   

## 2010-12-12 NOTE — Telephone Encounter (Signed)
NOTED.WE WILL CHECK LATER TODAY AND ADVISE ER REFERRAL IF INDICATED.Marland KitchenMarland Kitchen

## 2010-12-12 NOTE — Telephone Encounter (Signed)
Late entry prior to Dr Norval Gable note at 4:33pm. Spoke with pt's husband about 4pm and mentally, he stated she was better than last night. He reported she had the stool with a small amount of blood when wiping yesterday and hasn't had any stool or blood today. She did eat today and did ok. She has a little abdominal discomfort at times above the belly button. Instructed husband to call for questions to doc on call and if the pain worsened or she had BRB in the toilet to take pt to the ER. I will call and check on the pt tomorrow; husband stated understanding.

## 2010-12-12 NOTE — Telephone Encounter (Signed)
lmom at home and mobile numbers for pt and/or her husband to call back. There is no other number listed for husband.

## 2010-12-12 NOTE — Telephone Encounter (Signed)
Pt had a COLON on 12/10/10 with multiple polyp removal; poor prep and poor sedation. Per note below, husband was concerned with her mental status. Spoke with pt who sounded a&o who c/o having abdominal pain above her belly button. She also c/o increased gas. She doesn't remember much from yesterday, but reports she had a small stool this am and blood was present. She stated she has been bleeding since the procedure and implied that it was a large amount. She gave me her husband's number and he's still not sure of her mental status. She cooked herself an egg a few minutes ago and forgot to turn the stove off. He doesn't think she's bleeding that much and stated the RN who called- [facility] post procedure- stated to expect a little bleeding. Pt reports the blood is BRB. Should pt be seen with pain and bleeding? Please advise. Thanks.

## 2010-12-13 NOTE — Telephone Encounter (Signed)
Spoke with pt's husband who stated his wife is back to normal, denies pain and had a BM today w/o any bleeding. Informed husband to call for questions or problems.

## 2010-12-14 ENCOUNTER — Encounter: Payer: Self-pay | Admitting: Gastroenterology

## 2011-01-30 ENCOUNTER — Ambulatory Visit: Payer: Self-pay | Admitting: Pain Medicine

## 2011-01-31 ENCOUNTER — Ambulatory Visit: Payer: Self-pay | Admitting: Pain Medicine

## 2011-02-11 ENCOUNTER — Ambulatory Visit: Payer: Self-pay | Admitting: Pain Medicine

## 2011-04-16 DIAGNOSIS — R946 Abnormal results of thyroid function studies: Secondary | ICD-10-CM | POA: Diagnosis not present

## 2011-04-30 ENCOUNTER — Ambulatory Visit: Payer: Self-pay

## 2011-05-09 ENCOUNTER — Ambulatory Visit: Payer: Self-pay | Admitting: Pain Medicine

## 2011-05-29 ENCOUNTER — Ambulatory Visit: Payer: Self-pay | Admitting: Family Medicine

## 2011-06-10 ENCOUNTER — Ambulatory Visit: Payer: Self-pay | Admitting: Pain Medicine

## 2011-06-13 ENCOUNTER — Ambulatory Visit: Payer: Self-pay | Admitting: Pain Medicine

## 2011-07-23 ENCOUNTER — Ambulatory Visit: Payer: Self-pay | Admitting: Pain Medicine

## 2011-07-25 ENCOUNTER — Ambulatory Visit: Payer: Self-pay | Admitting: Pain Medicine

## 2011-08-12 ENCOUNTER — Ambulatory Visit: Payer: Self-pay | Admitting: Pain Medicine

## 2011-09-13 ENCOUNTER — Ambulatory Visit: Payer: Self-pay | Admitting: Family Medicine

## 2011-09-24 ENCOUNTER — Ambulatory Visit: Payer: Self-pay | Admitting: Pain Medicine

## 2011-10-01 ENCOUNTER — Ambulatory Visit: Payer: Self-pay | Admitting: Pain Medicine

## 2011-11-22 ENCOUNTER — Encounter: Payer: Self-pay | Admitting: Gastroenterology

## 2011-12-18 ENCOUNTER — Ambulatory Visit: Payer: Self-pay | Admitting: Pain Medicine

## 2012-01-13 ENCOUNTER — Ambulatory Visit: Payer: Self-pay | Admitting: Pain Medicine

## 2012-01-25 ENCOUNTER — Emergency Department: Payer: Self-pay | Admitting: Unknown Physician Specialty

## 2012-02-25 ENCOUNTER — Telehealth: Payer: Self-pay | Admitting: Gastroenterology

## 2012-02-25 NOTE — Telephone Encounter (Signed)
Patient is transferring care once she finds someone closer to home.

## 2012-04-21 ENCOUNTER — Ambulatory Visit: Payer: Self-pay | Admitting: Pain Medicine

## 2012-04-27 ENCOUNTER — Ambulatory Visit: Payer: Self-pay | Admitting: Pain Medicine

## 2012-04-27 LAB — CBC WITH DIFFERENTIAL/PLATELET
Eosinophil #: 0 10*3/uL (ref 0.0–0.7)
HCT: 40.1 % (ref 35.0–47.0)
MCV: 89 fL (ref 80–100)
Monocyte #: 0.6 x10 3/mm (ref 0.2–0.9)
Monocyte %: 6.5 %
Neutrophil #: 6.7 10*3/uL — ABNORMAL HIGH (ref 1.4–6.5)
Neutrophil %: 68 %
Platelet: 312 10*3/uL (ref 150–440)
RBC: 4.54 10*6/uL (ref 3.80–5.20)

## 2012-04-27 LAB — SEDIMENTATION RATE: Erythrocyte Sed Rate: 5 mm/hr (ref 0–30)

## 2012-05-11 ENCOUNTER — Ambulatory Visit: Payer: Self-pay | Admitting: Pain Medicine

## 2012-05-14 ENCOUNTER — Ambulatory Visit: Payer: Self-pay | Admitting: Pain Medicine

## 2012-05-18 ENCOUNTER — Ambulatory Visit: Payer: Self-pay | Admitting: Unknown Physician Specialty

## 2012-05-18 ENCOUNTER — Ambulatory Visit: Payer: Self-pay | Admitting: Pain Medicine

## 2012-05-21 ENCOUNTER — Ambulatory Visit: Payer: Self-pay | Admitting: Pain Medicine

## 2012-06-04 ENCOUNTER — Ambulatory Visit: Payer: Self-pay | Admitting: Pain Medicine

## 2012-06-12 ENCOUNTER — Ambulatory Visit: Payer: Self-pay | Admitting: Pain Medicine

## 2012-06-12 ENCOUNTER — Other Ambulatory Visit: Payer: Self-pay | Admitting: Pain Medicine

## 2012-06-12 LAB — MAGNESIUM: Magnesium: 1.7 mg/dL — ABNORMAL LOW

## 2012-06-23 ENCOUNTER — Ambulatory Visit: Payer: Self-pay | Admitting: Pain Medicine

## 2012-07-07 ENCOUNTER — Ambulatory Visit: Payer: Self-pay | Admitting: Pain Medicine

## 2012-07-14 ENCOUNTER — Ambulatory Visit: Payer: Self-pay | Admitting: Pain Medicine

## 2012-07-21 ENCOUNTER — Ambulatory Visit: Payer: Self-pay | Admitting: Neurosurgery

## 2012-08-05 ENCOUNTER — Ambulatory Visit: Payer: Self-pay | Admitting: Neurosurgery

## 2012-08-07 ENCOUNTER — Ambulatory Visit: Payer: Self-pay | Admitting: Pain Medicine

## 2012-08-18 ENCOUNTER — Ambulatory Visit: Payer: Self-pay | Admitting: Pain Medicine

## 2012-10-05 ENCOUNTER — Ambulatory Visit: Payer: Self-pay | Admitting: Pain Medicine

## 2012-10-05 ENCOUNTER — Other Ambulatory Visit: Payer: Self-pay | Admitting: Pain Medicine

## 2012-10-05 LAB — MAGNESIUM: Magnesium: 1.9 mg/dL

## 2012-10-22 ENCOUNTER — Ambulatory Visit: Payer: Self-pay | Admitting: Pain Medicine

## 2012-10-30 ENCOUNTER — Ambulatory Visit: Payer: Self-pay | Admitting: Pain Medicine

## 2012-11-17 ENCOUNTER — Ambulatory Visit: Payer: Self-pay | Admitting: Family Medicine

## 2012-11-18 ENCOUNTER — Ambulatory Visit: Payer: Self-pay | Admitting: Family Medicine

## 2012-11-24 ENCOUNTER — Ambulatory Visit: Payer: Self-pay | Admitting: Pain Medicine

## 2012-12-09 ENCOUNTER — Ambulatory Visit: Payer: Self-pay | Admitting: Pain Medicine

## 2012-12-15 ENCOUNTER — Ambulatory Visit: Payer: Self-pay | Admitting: Pain Medicine

## 2013-01-04 ENCOUNTER — Ambulatory Visit: Payer: Self-pay | Admitting: Pain Medicine

## 2013-01-12 ENCOUNTER — Ambulatory Visit: Payer: Self-pay | Admitting: Pain Medicine

## 2013-01-28 DIAGNOSIS — S8010XA Contusion of unspecified lower leg, initial encounter: Secondary | ICD-10-CM | POA: Diagnosis not present

## 2013-01-28 DIAGNOSIS — W19XXXA Unspecified fall, initial encounter: Secondary | ICD-10-CM | POA: Diagnosis not present

## 2013-01-28 DIAGNOSIS — J209 Acute bronchitis, unspecified: Secondary | ICD-10-CM | POA: Diagnosis not present

## 2013-01-28 HISTORY — PX: OTHER SURGICAL HISTORY: SHX169

## 2013-02-01 DIAGNOSIS — J209 Acute bronchitis, unspecified: Secondary | ICD-10-CM | POA: Diagnosis not present

## 2013-02-01 DIAGNOSIS — Z6832 Body mass index (BMI) 32.0-32.9, adult: Secondary | ICD-10-CM | POA: Diagnosis not present

## 2013-02-01 DIAGNOSIS — J45909 Unspecified asthma, uncomplicated: Secondary | ICD-10-CM | POA: Diagnosis not present

## 2013-02-09 ENCOUNTER — Ambulatory Visit: Payer: Self-pay | Admitting: Pain Medicine

## 2013-02-09 DIAGNOSIS — F17200 Nicotine dependence, unspecified, uncomplicated: Secondary | ICD-10-CM | POA: Diagnosis not present

## 2013-02-09 DIAGNOSIS — K219 Gastro-esophageal reflux disease without esophagitis: Secondary | ICD-10-CM | POA: Diagnosis not present

## 2013-02-09 DIAGNOSIS — Z79899 Other long term (current) drug therapy: Secondary | ICD-10-CM | POA: Diagnosis not present

## 2013-02-09 DIAGNOSIS — Z7982 Long term (current) use of aspirin: Secondary | ICD-10-CM | POA: Diagnosis not present

## 2013-02-09 DIAGNOSIS — M25559 Pain in unspecified hip: Secondary | ICD-10-CM | POA: Diagnosis not present

## 2013-02-09 DIAGNOSIS — G894 Chronic pain syndrome: Secondary | ICD-10-CM | POA: Diagnosis not present

## 2013-02-09 DIAGNOSIS — M961 Postlaminectomy syndrome, not elsewhere classified: Secondary | ICD-10-CM | POA: Diagnosis not present

## 2013-02-09 DIAGNOSIS — IMO0002 Reserved for concepts with insufficient information to code with codable children: Secondary | ICD-10-CM | POA: Diagnosis not present

## 2013-02-09 DIAGNOSIS — F411 Generalized anxiety disorder: Secondary | ICD-10-CM | POA: Diagnosis not present

## 2013-02-09 DIAGNOSIS — M169 Osteoarthritis of hip, unspecified: Secondary | ICD-10-CM | POA: Diagnosis not present

## 2013-02-09 DIAGNOSIS — M5137 Other intervertebral disc degeneration, lumbosacral region: Secondary | ICD-10-CM | POA: Diagnosis not present

## 2013-02-09 DIAGNOSIS — M810 Age-related osteoporosis without current pathological fracture: Secondary | ICD-10-CM | POA: Diagnosis not present

## 2013-02-09 DIAGNOSIS — M1610 Unilateral primary osteoarthritis, unspecified hip: Secondary | ICD-10-CM | POA: Diagnosis not present

## 2013-02-09 DIAGNOSIS — G8929 Other chronic pain: Secondary | ICD-10-CM | POA: Diagnosis not present

## 2013-02-09 DIAGNOSIS — IMO0001 Reserved for inherently not codable concepts without codable children: Secondary | ICD-10-CM | POA: Diagnosis not present

## 2013-02-09 DIAGNOSIS — M461 Sacroiliitis, not elsewhere classified: Secondary | ICD-10-CM | POA: Diagnosis not present

## 2013-02-09 DIAGNOSIS — Z201 Contact with and (suspected) exposure to tuberculosis: Secondary | ICD-10-CM | POA: Diagnosis not present

## 2013-02-09 DIAGNOSIS — M4716 Other spondylosis with myelopathy, lumbar region: Secondary | ICD-10-CM | POA: Diagnosis not present

## 2013-02-09 DIAGNOSIS — G2581 Restless legs syndrome: Secondary | ICD-10-CM | POA: Diagnosis not present

## 2013-02-09 DIAGNOSIS — K589 Irritable bowel syndrome without diarrhea: Secondary | ICD-10-CM | POA: Diagnosis not present

## 2013-02-09 DIAGNOSIS — F3289 Other specified depressive episodes: Secondary | ICD-10-CM | POA: Diagnosis not present

## 2013-02-09 DIAGNOSIS — M76899 Other specified enthesopathies of unspecified lower limb, excluding foot: Secondary | ICD-10-CM | POA: Diagnosis not present

## 2013-02-09 DIAGNOSIS — M5126 Other intervertebral disc displacement, lumbar region: Secondary | ICD-10-CM | POA: Diagnosis not present

## 2013-02-09 DIAGNOSIS — I10 Essential (primary) hypertension: Secondary | ICD-10-CM | POA: Diagnosis not present

## 2013-02-09 DIAGNOSIS — F410 Panic disorder [episodic paroxysmal anxiety] without agoraphobia: Secondary | ICD-10-CM | POA: Diagnosis not present

## 2013-02-09 DIAGNOSIS — K449 Diaphragmatic hernia without obstruction or gangrene: Secondary | ICD-10-CM | POA: Diagnosis not present

## 2013-02-09 DIAGNOSIS — G9332 Myalgic encephalomyelitis/chronic fatigue syndrome: Secondary | ICD-10-CM | POA: Diagnosis not present

## 2013-02-09 DIAGNOSIS — G4700 Insomnia, unspecified: Secondary | ICD-10-CM | POA: Diagnosis not present

## 2013-02-09 DIAGNOSIS — F329 Major depressive disorder, single episode, unspecified: Secondary | ICD-10-CM | POA: Diagnosis not present

## 2013-02-09 DIAGNOSIS — J449 Chronic obstructive pulmonary disease, unspecified: Secondary | ICD-10-CM | POA: Diagnosis not present

## 2013-02-09 DIAGNOSIS — R5382 Chronic fatigue, unspecified: Secondary | ICD-10-CM | POA: Diagnosis not present

## 2013-02-09 DIAGNOSIS — Z9889 Other specified postprocedural states: Secondary | ICD-10-CM | POA: Diagnosis not present

## 2013-02-19 DIAGNOSIS — F4542 Pain disorder with related psychological factors: Secondary | ICD-10-CM | POA: Diagnosis not present

## 2013-02-19 DIAGNOSIS — F4323 Adjustment disorder with mixed anxiety and depressed mood: Secondary | ICD-10-CM | POA: Diagnosis not present

## 2013-02-22 ENCOUNTER — Ambulatory Visit: Payer: Self-pay | Admitting: Pain Medicine

## 2013-02-22 DIAGNOSIS — M1610 Unilateral primary osteoarthritis, unspecified hip: Secondary | ICD-10-CM | POA: Diagnosis not present

## 2013-02-22 DIAGNOSIS — M779 Enthesopathy, unspecified: Secondary | ICD-10-CM | POA: Diagnosis not present

## 2013-02-22 DIAGNOSIS — F4542 Pain disorder with related psychological factors: Secondary | ICD-10-CM | POA: Diagnosis not present

## 2013-02-22 DIAGNOSIS — G894 Chronic pain syndrome: Secondary | ICD-10-CM | POA: Diagnosis not present

## 2013-02-22 DIAGNOSIS — M109 Gout, unspecified: Secondary | ICD-10-CM | POA: Diagnosis not present

## 2013-02-22 DIAGNOSIS — M5126 Other intervertebral disc displacement, lumbar region: Secondary | ICD-10-CM | POA: Diagnosis not present

## 2013-02-22 DIAGNOSIS — M169 Osteoarthritis of hip, unspecified: Secondary | ICD-10-CM | POA: Diagnosis not present

## 2013-02-22 DIAGNOSIS — IMO0002 Reserved for concepts with insufficient information to code with codable children: Secondary | ICD-10-CM | POA: Diagnosis not present

## 2013-02-22 DIAGNOSIS — F4323 Adjustment disorder with mixed anxiety and depressed mood: Secondary | ICD-10-CM | POA: Diagnosis not present

## 2013-02-22 DIAGNOSIS — M25559 Pain in unspecified hip: Secondary | ICD-10-CM | POA: Diagnosis not present

## 2013-02-22 LAB — SYNOVIAL FLUID, CRYSTAL: Crystals, Joint Fluid: NONE SEEN

## 2013-03-03 DIAGNOSIS — M5030 Other cervical disc degeneration, unspecified cervical region: Secondary | ICD-10-CM | POA: Diagnosis not present

## 2013-03-03 DIAGNOSIS — M5137 Other intervertebral disc degeneration, lumbosacral region: Secondary | ICD-10-CM | POA: Diagnosis not present

## 2013-03-03 DIAGNOSIS — M999 Biomechanical lesion, unspecified: Secondary | ICD-10-CM | POA: Diagnosis not present

## 2013-03-03 DIAGNOSIS — M5430 Sciatica, unspecified side: Secondary | ICD-10-CM | POA: Diagnosis not present

## 2013-03-03 DIAGNOSIS — M9981 Other biomechanical lesions of cervical region: Secondary | ICD-10-CM | POA: Diagnosis not present

## 2013-03-09 DIAGNOSIS — J45909 Unspecified asthma, uncomplicated: Secondary | ICD-10-CM | POA: Diagnosis not present

## 2013-03-09 DIAGNOSIS — R059 Cough, unspecified: Secondary | ICD-10-CM | POA: Diagnosis not present

## 2013-03-09 DIAGNOSIS — R05 Cough: Secondary | ICD-10-CM | POA: Diagnosis not present

## 2013-03-31 ENCOUNTER — Ambulatory Visit: Payer: Self-pay | Admitting: Pain Medicine

## 2013-03-31 DIAGNOSIS — K219 Gastro-esophageal reflux disease without esophagitis: Secondary | ICD-10-CM | POA: Diagnosis not present

## 2013-03-31 DIAGNOSIS — Z9889 Other specified postprocedural states: Secondary | ICD-10-CM | POA: Diagnosis not present

## 2013-03-31 DIAGNOSIS — J449 Chronic obstructive pulmonary disease, unspecified: Secondary | ICD-10-CM | POA: Diagnosis not present

## 2013-03-31 DIAGNOSIS — G894 Chronic pain syndrome: Secondary | ICD-10-CM | POA: Diagnosis not present

## 2013-03-31 DIAGNOSIS — G8929 Other chronic pain: Secondary | ICD-10-CM | POA: Diagnosis not present

## 2013-03-31 DIAGNOSIS — M169 Osteoarthritis of hip, unspecified: Secondary | ICD-10-CM | POA: Diagnosis not present

## 2013-03-31 DIAGNOSIS — F3289 Other specified depressive episodes: Secondary | ICD-10-CM | POA: Diagnosis not present

## 2013-03-31 DIAGNOSIS — IMO0002 Reserved for concepts with insufficient information to code with codable children: Secondary | ICD-10-CM | POA: Diagnosis not present

## 2013-03-31 DIAGNOSIS — F411 Generalized anxiety disorder: Secondary | ICD-10-CM | POA: Diagnosis not present

## 2013-03-31 DIAGNOSIS — M5126 Other intervertebral disc displacement, lumbar region: Secondary | ICD-10-CM | POA: Diagnosis not present

## 2013-03-31 DIAGNOSIS — F329 Major depressive disorder, single episode, unspecified: Secondary | ICD-10-CM | POA: Diagnosis not present

## 2013-03-31 DIAGNOSIS — Z8709 Personal history of other diseases of the respiratory system: Secondary | ICD-10-CM | POA: Diagnosis not present

## 2013-03-31 DIAGNOSIS — Z7982 Long term (current) use of aspirin: Secondary | ICD-10-CM | POA: Diagnosis not present

## 2013-03-31 DIAGNOSIS — I10 Essential (primary) hypertension: Secondary | ICD-10-CM | POA: Diagnosis not present

## 2013-03-31 DIAGNOSIS — Z79899 Other long term (current) drug therapy: Secondary | ICD-10-CM | POA: Diagnosis not present

## 2013-03-31 DIAGNOSIS — M25559 Pain in unspecified hip: Secondary | ICD-10-CM | POA: Diagnosis not present

## 2013-03-31 DIAGNOSIS — M4716 Other spondylosis with myelopathy, lumbar region: Secondary | ICD-10-CM | POA: Diagnosis not present

## 2013-03-31 DIAGNOSIS — G4700 Insomnia, unspecified: Secondary | ICD-10-CM | POA: Diagnosis not present

## 2013-03-31 DIAGNOSIS — M461 Sacroiliitis, not elsewhere classified: Secondary | ICD-10-CM | POA: Diagnosis not present

## 2013-03-31 DIAGNOSIS — G2581 Restless legs syndrome: Secondary | ICD-10-CM | POA: Diagnosis not present

## 2013-03-31 DIAGNOSIS — M1610 Unilateral primary osteoarthritis, unspecified hip: Secondary | ICD-10-CM | POA: Diagnosis not present

## 2013-03-31 DIAGNOSIS — G9332 Myalgic encephalomyelitis/chronic fatigue syndrome: Secondary | ICD-10-CM | POA: Diagnosis not present

## 2013-03-31 DIAGNOSIS — Z87891 Personal history of nicotine dependence: Secondary | ICD-10-CM | POA: Diagnosis not present

## 2013-03-31 DIAGNOSIS — M961 Postlaminectomy syndrome, not elsewhere classified: Secondary | ICD-10-CM | POA: Diagnosis not present

## 2013-03-31 DIAGNOSIS — F410 Panic disorder [episodic paroxysmal anxiety] without agoraphobia: Secondary | ICD-10-CM | POA: Diagnosis not present

## 2013-03-31 DIAGNOSIS — Z5181 Encounter for therapeutic drug level monitoring: Secondary | ICD-10-CM | POA: Diagnosis not present

## 2013-03-31 DIAGNOSIS — K589 Irritable bowel syndrome without diarrhea: Secondary | ICD-10-CM | POA: Diagnosis not present

## 2013-03-31 DIAGNOSIS — IMO0001 Reserved for inherently not codable concepts without codable children: Secondary | ICD-10-CM | POA: Diagnosis not present

## 2013-03-31 DIAGNOSIS — M810 Age-related osteoporosis without current pathological fracture: Secondary | ICD-10-CM | POA: Diagnosis not present

## 2013-03-31 DIAGNOSIS — Z201 Contact with and (suspected) exposure to tuberculosis: Secondary | ICD-10-CM | POA: Diagnosis not present

## 2013-03-31 DIAGNOSIS — R5382 Chronic fatigue, unspecified: Secondary | ICD-10-CM | POA: Diagnosis not present

## 2013-03-31 DIAGNOSIS — M5137 Other intervertebral disc degeneration, lumbosacral region: Secondary | ICD-10-CM | POA: Diagnosis not present

## 2013-03-31 DIAGNOSIS — K449 Diaphragmatic hernia without obstruction or gangrene: Secondary | ICD-10-CM | POA: Diagnosis not present

## 2013-04-12 DIAGNOSIS — Z79899 Other long term (current) drug therapy: Secondary | ICD-10-CM | POA: Diagnosis not present

## 2013-04-12 DIAGNOSIS — E782 Mixed hyperlipidemia: Secondary | ICD-10-CM | POA: Diagnosis not present

## 2013-04-12 DIAGNOSIS — Z1331 Encounter for screening for depression: Secondary | ICD-10-CM | POA: Diagnosis not present

## 2013-04-12 DIAGNOSIS — Z9181 History of falling: Secondary | ICD-10-CM | POA: Diagnosis not present

## 2013-04-12 DIAGNOSIS — F341 Dysthymic disorder: Secondary | ICD-10-CM | POA: Diagnosis not present

## 2013-04-12 DIAGNOSIS — I10 Essential (primary) hypertension: Secondary | ICD-10-CM | POA: Diagnosis not present

## 2013-04-12 DIAGNOSIS — R609 Edema, unspecified: Secondary | ICD-10-CM | POA: Diagnosis not present

## 2013-04-20 ENCOUNTER — Ambulatory Visit: Payer: Self-pay | Admitting: Pain Medicine

## 2013-04-20 DIAGNOSIS — M779 Enthesopathy, unspecified: Secondary | ICD-10-CM | POA: Diagnosis not present

## 2013-04-20 DIAGNOSIS — IMO0002 Reserved for concepts with insufficient information to code with codable children: Secondary | ICD-10-CM | POA: Diagnosis not present

## 2013-04-20 DIAGNOSIS — G894 Chronic pain syndrome: Secondary | ICD-10-CM | POA: Diagnosis not present

## 2013-04-26 ENCOUNTER — Ambulatory Visit: Payer: Self-pay | Admitting: Pain Medicine

## 2013-04-26 DIAGNOSIS — F411 Generalized anxiety disorder: Secondary | ICD-10-CM | POA: Diagnosis not present

## 2013-04-26 DIAGNOSIS — K589 Irritable bowel syndrome without diarrhea: Secondary | ICD-10-CM | POA: Diagnosis not present

## 2013-04-26 DIAGNOSIS — M810 Age-related osteoporosis without current pathological fracture: Secondary | ICD-10-CM | POA: Diagnosis not present

## 2013-04-26 DIAGNOSIS — IMO0001 Reserved for inherently not codable concepts without codable children: Secondary | ICD-10-CM | POA: Diagnosis not present

## 2013-04-26 DIAGNOSIS — F410 Panic disorder [episodic paroxysmal anxiety] without agoraphobia: Secondary | ICD-10-CM | POA: Diagnosis not present

## 2013-04-26 DIAGNOSIS — M899 Disorder of bone, unspecified: Secondary | ICD-10-CM | POA: Diagnosis not present

## 2013-04-26 DIAGNOSIS — G2581 Restless legs syndrome: Secondary | ICD-10-CM | POA: Diagnosis not present

## 2013-04-26 DIAGNOSIS — F3289 Other specified depressive episodes: Secondary | ICD-10-CM | POA: Diagnosis not present

## 2013-04-26 DIAGNOSIS — R5382 Chronic fatigue, unspecified: Secondary | ICD-10-CM | POA: Diagnosis not present

## 2013-04-26 DIAGNOSIS — K449 Diaphragmatic hernia without obstruction or gangrene: Secondary | ICD-10-CM | POA: Diagnosis not present

## 2013-04-26 DIAGNOSIS — F17200 Nicotine dependence, unspecified, uncomplicated: Secondary | ICD-10-CM | POA: Diagnosis not present

## 2013-04-26 DIAGNOSIS — M949 Disorder of cartilage, unspecified: Secondary | ICD-10-CM | POA: Diagnosis not present

## 2013-04-26 DIAGNOSIS — G8929 Other chronic pain: Secondary | ICD-10-CM | POA: Diagnosis not present

## 2013-04-26 DIAGNOSIS — G9332 Myalgic encephalomyelitis/chronic fatigue syndrome: Secondary | ICD-10-CM | POA: Diagnosis not present

## 2013-04-26 DIAGNOSIS — I10 Essential (primary) hypertension: Secondary | ICD-10-CM | POA: Diagnosis not present

## 2013-04-26 DIAGNOSIS — Z201 Contact with and (suspected) exposure to tuberculosis: Secondary | ICD-10-CM | POA: Diagnosis not present

## 2013-04-26 DIAGNOSIS — M76899 Other specified enthesopathies of unspecified lower limb, excluding foot: Secondary | ICD-10-CM | POA: Diagnosis not present

## 2013-04-26 DIAGNOSIS — Z9889 Other specified postprocedural states: Secondary | ICD-10-CM | POA: Diagnosis not present

## 2013-04-26 DIAGNOSIS — Z7982 Long term (current) use of aspirin: Secondary | ICD-10-CM | POA: Diagnosis not present

## 2013-04-26 DIAGNOSIS — J449 Chronic obstructive pulmonary disease, unspecified: Secondary | ICD-10-CM | POA: Diagnosis not present

## 2013-04-26 DIAGNOSIS — M461 Sacroiliitis, not elsewhere classified: Secondary | ICD-10-CM | POA: Diagnosis not present

## 2013-04-26 DIAGNOSIS — Z79899 Other long term (current) drug therapy: Secondary | ICD-10-CM | POA: Diagnosis not present

## 2013-04-26 DIAGNOSIS — M169 Osteoarthritis of hip, unspecified: Secondary | ICD-10-CM | POA: Diagnosis not present

## 2013-04-26 DIAGNOSIS — M961 Postlaminectomy syndrome, not elsewhere classified: Secondary | ICD-10-CM | POA: Diagnosis not present

## 2013-04-26 DIAGNOSIS — K219 Gastro-esophageal reflux disease without esophagitis: Secondary | ICD-10-CM | POA: Diagnosis not present

## 2013-04-26 DIAGNOSIS — F329 Major depressive disorder, single episode, unspecified: Secondary | ICD-10-CM | POA: Diagnosis not present

## 2013-04-26 DIAGNOSIS — M5137 Other intervertebral disc degeneration, lumbosacral region: Secondary | ICD-10-CM | POA: Diagnosis not present

## 2013-04-26 DIAGNOSIS — M4716 Other spondylosis with myelopathy, lumbar region: Secondary | ICD-10-CM | POA: Diagnosis not present

## 2013-04-26 DIAGNOSIS — M1610 Unilateral primary osteoarthritis, unspecified hip: Secondary | ICD-10-CM | POA: Diagnosis not present

## 2013-04-27 ENCOUNTER — Ambulatory Visit: Payer: Self-pay | Admitting: Pain Medicine

## 2013-04-27 DIAGNOSIS — R5382 Chronic fatigue, unspecified: Secondary | ICD-10-CM | POA: Diagnosis not present

## 2013-04-27 DIAGNOSIS — J449 Chronic obstructive pulmonary disease, unspecified: Secondary | ICD-10-CM | POA: Diagnosis not present

## 2013-04-27 DIAGNOSIS — M5126 Other intervertebral disc displacement, lumbar region: Secondary | ICD-10-CM | POA: Diagnosis not present

## 2013-04-27 DIAGNOSIS — Z7982 Long term (current) use of aspirin: Secondary | ICD-10-CM | POA: Diagnosis not present

## 2013-04-27 DIAGNOSIS — IMO0002 Reserved for concepts with insufficient information to code with codable children: Secondary | ICD-10-CM | POA: Diagnosis not present

## 2013-04-27 DIAGNOSIS — Z87891 Personal history of nicotine dependence: Secondary | ICD-10-CM | POA: Diagnosis not present

## 2013-04-27 DIAGNOSIS — F411 Generalized anxiety disorder: Secondary | ICD-10-CM | POA: Diagnosis not present

## 2013-04-27 DIAGNOSIS — M5137 Other intervertebral disc degeneration, lumbosacral region: Secondary | ICD-10-CM | POA: Diagnosis not present

## 2013-04-27 DIAGNOSIS — K219 Gastro-esophageal reflux disease without esophagitis: Secondary | ICD-10-CM | POA: Diagnosis not present

## 2013-04-27 DIAGNOSIS — M961 Postlaminectomy syndrome, not elsewhere classified: Secondary | ICD-10-CM | POA: Diagnosis not present

## 2013-04-27 DIAGNOSIS — K449 Diaphragmatic hernia without obstruction or gangrene: Secondary | ICD-10-CM | POA: Diagnosis not present

## 2013-04-27 DIAGNOSIS — I10 Essential (primary) hypertension: Secondary | ICD-10-CM | POA: Diagnosis not present

## 2013-04-27 DIAGNOSIS — M1610 Unilateral primary osteoarthritis, unspecified hip: Secondary | ICD-10-CM | POA: Diagnosis not present

## 2013-04-27 DIAGNOSIS — F329 Major depressive disorder, single episode, unspecified: Secondary | ICD-10-CM | POA: Diagnosis not present

## 2013-04-27 DIAGNOSIS — M25559 Pain in unspecified hip: Secondary | ICD-10-CM | POA: Diagnosis not present

## 2013-04-27 DIAGNOSIS — G8929 Other chronic pain: Secondary | ICD-10-CM | POA: Diagnosis not present

## 2013-04-27 DIAGNOSIS — G4700 Insomnia, unspecified: Secondary | ICD-10-CM | POA: Diagnosis not present

## 2013-04-27 DIAGNOSIS — G9332 Myalgic encephalomyelitis/chronic fatigue syndrome: Secondary | ICD-10-CM | POA: Diagnosis not present

## 2013-04-27 DIAGNOSIS — F410 Panic disorder [episodic paroxysmal anxiety] without agoraphobia: Secondary | ICD-10-CM | POA: Diagnosis not present

## 2013-04-27 DIAGNOSIS — IMO0001 Reserved for inherently not codable concepts without codable children: Secondary | ICD-10-CM | POA: Diagnosis not present

## 2013-04-27 DIAGNOSIS — M4716 Other spondylosis with myelopathy, lumbar region: Secondary | ICD-10-CM | POA: Diagnosis not present

## 2013-04-27 DIAGNOSIS — Z8709 Personal history of other diseases of the respiratory system: Secondary | ICD-10-CM | POA: Diagnosis not present

## 2013-04-27 DIAGNOSIS — F3289 Other specified depressive episodes: Secondary | ICD-10-CM | POA: Diagnosis not present

## 2013-04-27 DIAGNOSIS — M461 Sacroiliitis, not elsewhere classified: Secondary | ICD-10-CM | POA: Diagnosis not present

## 2013-04-27 DIAGNOSIS — Z201 Contact with and (suspected) exposure to tuberculosis: Secondary | ICD-10-CM | POA: Diagnosis not present

## 2013-04-27 DIAGNOSIS — M76899 Other specified enthesopathies of unspecified lower limb, excluding foot: Secondary | ICD-10-CM | POA: Diagnosis not present

## 2013-04-27 DIAGNOSIS — K589 Irritable bowel syndrome without diarrhea: Secondary | ICD-10-CM | POA: Diagnosis not present

## 2013-04-27 DIAGNOSIS — G2581 Restless legs syndrome: Secondary | ICD-10-CM | POA: Diagnosis not present

## 2013-04-27 DIAGNOSIS — M169 Osteoarthritis of hip, unspecified: Secondary | ICD-10-CM | POA: Diagnosis not present

## 2013-04-27 DIAGNOSIS — Z9889 Other specified postprocedural states: Secondary | ICD-10-CM | POA: Diagnosis not present

## 2013-05-17 ENCOUNTER — Ambulatory Visit: Payer: Self-pay | Admitting: Pain Medicine

## 2013-05-17 DIAGNOSIS — I119 Hypertensive heart disease without heart failure: Secondary | ICD-10-CM | POA: Diagnosis not present

## 2013-05-17 DIAGNOSIS — I10 Essential (primary) hypertension: Secondary | ICD-10-CM | POA: Diagnosis not present

## 2013-05-17 DIAGNOSIS — Z01810 Encounter for preprocedural cardiovascular examination: Secondary | ICD-10-CM | POA: Diagnosis not present

## 2013-05-25 ENCOUNTER — Ambulatory Visit: Payer: Self-pay | Admitting: Pain Medicine

## 2013-05-25 DIAGNOSIS — G894 Chronic pain syndrome: Secondary | ICD-10-CM | POA: Diagnosis not present

## 2013-05-25 DIAGNOSIS — R5382 Chronic fatigue, unspecified: Secondary | ICD-10-CM | POA: Diagnosis not present

## 2013-05-25 DIAGNOSIS — M169 Osteoarthritis of hip, unspecified: Secondary | ICD-10-CM | POA: Diagnosis not present

## 2013-05-25 DIAGNOSIS — M47817 Spondylosis without myelopathy or radiculopathy, lumbosacral region: Secondary | ICD-10-CM | POA: Diagnosis not present

## 2013-05-25 DIAGNOSIS — IMO0001 Reserved for inherently not codable concepts without codable children: Secondary | ICD-10-CM | POA: Diagnosis not present

## 2013-05-25 DIAGNOSIS — Z888 Allergy status to other drugs, medicaments and biological substances status: Secondary | ICD-10-CM | POA: Diagnosis not present

## 2013-05-25 DIAGNOSIS — F3289 Other specified depressive episodes: Secondary | ICD-10-CM | POA: Diagnosis not present

## 2013-05-25 DIAGNOSIS — M779 Enthesopathy, unspecified: Secondary | ICD-10-CM | POA: Diagnosis not present

## 2013-05-25 DIAGNOSIS — G9332 Myalgic encephalomyelitis/chronic fatigue syndrome: Secondary | ICD-10-CM | POA: Diagnosis not present

## 2013-05-25 DIAGNOSIS — M810 Age-related osteoporosis without current pathological fracture: Secondary | ICD-10-CM | POA: Diagnosis not present

## 2013-05-25 DIAGNOSIS — M76899 Other specified enthesopathies of unspecified lower limb, excluding foot: Secondary | ICD-10-CM | POA: Diagnosis not present

## 2013-05-25 DIAGNOSIS — M519 Unspecified thoracic, thoracolumbar and lumbosacral intervertebral disc disorder: Secondary | ICD-10-CM | POA: Diagnosis not present

## 2013-05-25 DIAGNOSIS — K589 Irritable bowel syndrome without diarrhea: Secondary | ICD-10-CM | POA: Diagnosis not present

## 2013-05-25 DIAGNOSIS — Z7982 Long term (current) use of aspirin: Secondary | ICD-10-CM | POA: Diagnosis not present

## 2013-05-25 DIAGNOSIS — F411 Generalized anxiety disorder: Secondary | ICD-10-CM | POA: Diagnosis not present

## 2013-05-25 DIAGNOSIS — K219 Gastro-esophageal reflux disease without esophagitis: Secondary | ICD-10-CM | POA: Diagnosis not present

## 2013-05-25 DIAGNOSIS — Z79899 Other long term (current) drug therapy: Secondary | ICD-10-CM | POA: Diagnosis not present

## 2013-05-25 DIAGNOSIS — M1610 Unilateral primary osteoarthritis, unspecified hip: Secondary | ICD-10-CM | POA: Diagnosis not present

## 2013-05-25 DIAGNOSIS — M79609 Pain in unspecified limb: Secondary | ICD-10-CM | POA: Diagnosis not present

## 2013-05-25 DIAGNOSIS — Z880 Allergy status to penicillin: Secondary | ICD-10-CM | POA: Diagnosis not present

## 2013-05-25 DIAGNOSIS — IMO0002 Reserved for concepts with insufficient information to code with codable children: Secondary | ICD-10-CM | POA: Diagnosis not present

## 2013-05-25 DIAGNOSIS — Z6830 Body mass index (BMI) 30.0-30.9, adult: Secondary | ICD-10-CM | POA: Diagnosis not present

## 2013-05-25 DIAGNOSIS — M961 Postlaminectomy syndrome, not elsewhere classified: Secondary | ICD-10-CM | POA: Diagnosis not present

## 2013-05-25 DIAGNOSIS — G2581 Restless legs syndrome: Secondary | ICD-10-CM | POA: Diagnosis not present

## 2013-05-25 DIAGNOSIS — F329 Major depressive disorder, single episode, unspecified: Secondary | ICD-10-CM | POA: Diagnosis not present

## 2013-05-25 DIAGNOSIS — E669 Obesity, unspecified: Secondary | ICD-10-CM | POA: Diagnosis not present

## 2013-05-25 DIAGNOSIS — M461 Sacroiliitis, not elsewhere classified: Secondary | ICD-10-CM | POA: Diagnosis not present

## 2013-05-25 DIAGNOSIS — F410 Panic disorder [episodic paroxysmal anxiety] without agoraphobia: Secondary | ICD-10-CM | POA: Diagnosis not present

## 2013-06-03 ENCOUNTER — Ambulatory Visit: Payer: Self-pay | Admitting: Pain Medicine

## 2013-06-03 DIAGNOSIS — M461 Sacroiliitis, not elsewhere classified: Secondary | ICD-10-CM | POA: Diagnosis not present

## 2013-06-03 DIAGNOSIS — R5382 Chronic fatigue, unspecified: Secondary | ICD-10-CM | POA: Diagnosis not present

## 2013-06-03 DIAGNOSIS — J449 Chronic obstructive pulmonary disease, unspecified: Secondary | ICD-10-CM | POA: Diagnosis not present

## 2013-06-03 DIAGNOSIS — Z87891 Personal history of nicotine dependence: Secondary | ICD-10-CM | POA: Diagnosis not present

## 2013-06-03 DIAGNOSIS — M76899 Other specified enthesopathies of unspecified lower limb, excluding foot: Secondary | ICD-10-CM | POA: Diagnosis not present

## 2013-06-03 DIAGNOSIS — F3289 Other specified depressive episodes: Secondary | ICD-10-CM | POA: Diagnosis not present

## 2013-06-03 DIAGNOSIS — F410 Panic disorder [episodic paroxysmal anxiety] without agoraphobia: Secondary | ICD-10-CM | POA: Diagnosis not present

## 2013-06-03 DIAGNOSIS — IMO0001 Reserved for inherently not codable concepts without codable children: Secondary | ICD-10-CM | POA: Diagnosis not present

## 2013-06-03 DIAGNOSIS — Z9889 Other specified postprocedural states: Secondary | ICD-10-CM | POA: Diagnosis not present

## 2013-06-03 DIAGNOSIS — M1610 Unilateral primary osteoarthritis, unspecified hip: Secondary | ICD-10-CM | POA: Diagnosis not present

## 2013-06-03 DIAGNOSIS — Z79899 Other long term (current) drug therapy: Secondary | ICD-10-CM | POA: Diagnosis not present

## 2013-06-03 DIAGNOSIS — F411 Generalized anxiety disorder: Secondary | ICD-10-CM | POA: Diagnosis not present

## 2013-06-03 DIAGNOSIS — M961 Postlaminectomy syndrome, not elsewhere classified: Secondary | ICD-10-CM | POA: Diagnosis not present

## 2013-06-03 DIAGNOSIS — M5137 Other intervertebral disc degeneration, lumbosacral region: Secondary | ICD-10-CM | POA: Diagnosis not present

## 2013-06-03 DIAGNOSIS — Z8709 Personal history of other diseases of the respiratory system: Secondary | ICD-10-CM | POA: Diagnosis not present

## 2013-06-03 DIAGNOSIS — M810 Age-related osteoporosis without current pathological fracture: Secondary | ICD-10-CM | POA: Diagnosis not present

## 2013-06-03 DIAGNOSIS — G4700 Insomnia, unspecified: Secondary | ICD-10-CM | POA: Diagnosis not present

## 2013-06-03 DIAGNOSIS — I10 Essential (primary) hypertension: Secondary | ICD-10-CM | POA: Diagnosis not present

## 2013-06-03 DIAGNOSIS — K589 Irritable bowel syndrome without diarrhea: Secondary | ICD-10-CM | POA: Diagnosis not present

## 2013-06-03 DIAGNOSIS — F329 Major depressive disorder, single episode, unspecified: Secondary | ICD-10-CM | POA: Diagnosis not present

## 2013-06-03 DIAGNOSIS — M169 Osteoarthritis of hip, unspecified: Secondary | ICD-10-CM | POA: Diagnosis not present

## 2013-06-03 DIAGNOSIS — G9332 Myalgic encephalomyelitis/chronic fatigue syndrome: Secondary | ICD-10-CM | POA: Diagnosis not present

## 2013-06-03 DIAGNOSIS — M4716 Other spondylosis with myelopathy, lumbar region: Secondary | ICD-10-CM | POA: Diagnosis not present

## 2013-06-03 DIAGNOSIS — Z201 Contact with and (suspected) exposure to tuberculosis: Secondary | ICD-10-CM | POA: Diagnosis not present

## 2013-06-03 DIAGNOSIS — G2581 Restless legs syndrome: Secondary | ICD-10-CM | POA: Diagnosis not present

## 2013-06-03 DIAGNOSIS — Z7982 Long term (current) use of aspirin: Secondary | ICD-10-CM | POA: Diagnosis not present

## 2013-06-03 DIAGNOSIS — G8929 Other chronic pain: Secondary | ICD-10-CM | POA: Diagnosis not present

## 2013-06-18 ENCOUNTER — Ambulatory Visit: Payer: Self-pay | Admitting: Pain Medicine

## 2013-06-18 DIAGNOSIS — I10 Essential (primary) hypertension: Secondary | ICD-10-CM | POA: Diagnosis not present

## 2013-06-18 DIAGNOSIS — K219 Gastro-esophageal reflux disease without esophagitis: Secondary | ICD-10-CM | POA: Diagnosis not present

## 2013-06-18 DIAGNOSIS — M5137 Other intervertebral disc degeneration, lumbosacral region: Secondary | ICD-10-CM | POA: Diagnosis not present

## 2013-06-18 DIAGNOSIS — Z87891 Personal history of nicotine dependence: Secondary | ICD-10-CM | POA: Diagnosis not present

## 2013-06-18 DIAGNOSIS — IMO0002 Reserved for concepts with insufficient information to code with codable children: Secondary | ICD-10-CM | POA: Diagnosis not present

## 2013-06-18 DIAGNOSIS — G9332 Myalgic encephalomyelitis/chronic fatigue syndrome: Secondary | ICD-10-CM | POA: Diagnosis not present

## 2013-06-18 DIAGNOSIS — IMO0001 Reserved for inherently not codable concepts without codable children: Secondary | ICD-10-CM | POA: Diagnosis not present

## 2013-06-18 DIAGNOSIS — F3289 Other specified depressive episodes: Secondary | ICD-10-CM | POA: Diagnosis not present

## 2013-06-18 DIAGNOSIS — M169 Osteoarthritis of hip, unspecified: Secondary | ICD-10-CM | POA: Diagnosis not present

## 2013-06-18 DIAGNOSIS — Z7982 Long term (current) use of aspirin: Secondary | ICD-10-CM | POA: Diagnosis not present

## 2013-06-18 DIAGNOSIS — G4700 Insomnia, unspecified: Secondary | ICD-10-CM | POA: Diagnosis not present

## 2013-06-18 DIAGNOSIS — M961 Postlaminectomy syndrome, not elsewhere classified: Secondary | ICD-10-CM | POA: Diagnosis not present

## 2013-06-18 DIAGNOSIS — G8929 Other chronic pain: Secondary | ICD-10-CM | POA: Diagnosis not present

## 2013-06-18 DIAGNOSIS — G2581 Restless legs syndrome: Secondary | ICD-10-CM | POA: Diagnosis not present

## 2013-06-18 DIAGNOSIS — M4716 Other spondylosis with myelopathy, lumbar region: Secondary | ICD-10-CM | POA: Diagnosis not present

## 2013-06-18 DIAGNOSIS — F329 Major depressive disorder, single episode, unspecified: Secondary | ICD-10-CM | POA: Diagnosis not present

## 2013-06-18 DIAGNOSIS — Z201 Contact with and (suspected) exposure to tuberculosis: Secondary | ICD-10-CM | POA: Diagnosis not present

## 2013-06-18 DIAGNOSIS — J449 Chronic obstructive pulmonary disease, unspecified: Secondary | ICD-10-CM | POA: Diagnosis not present

## 2013-06-18 DIAGNOSIS — M461 Sacroiliitis, not elsewhere classified: Secondary | ICD-10-CM | POA: Diagnosis not present

## 2013-06-18 DIAGNOSIS — Z8709 Personal history of other diseases of the respiratory system: Secondary | ICD-10-CM | POA: Diagnosis not present

## 2013-06-18 DIAGNOSIS — M76899 Other specified enthesopathies of unspecified lower limb, excluding foot: Secondary | ICD-10-CM | POA: Diagnosis not present

## 2013-06-18 DIAGNOSIS — F411 Generalized anxiety disorder: Secondary | ICD-10-CM | POA: Diagnosis not present

## 2013-06-18 DIAGNOSIS — Z9889 Other specified postprocedural states: Secondary | ICD-10-CM | POA: Diagnosis not present

## 2013-06-18 DIAGNOSIS — K589 Irritable bowel syndrome without diarrhea: Secondary | ICD-10-CM | POA: Diagnosis not present

## 2013-06-18 DIAGNOSIS — R5382 Chronic fatigue, unspecified: Secondary | ICD-10-CM | POA: Diagnosis not present

## 2013-06-18 DIAGNOSIS — M1610 Unilateral primary osteoarthritis, unspecified hip: Secondary | ICD-10-CM | POA: Diagnosis not present

## 2013-06-18 DIAGNOSIS — F410 Panic disorder [episodic paroxysmal anxiety] without agoraphobia: Secondary | ICD-10-CM | POA: Diagnosis not present

## 2013-06-18 DIAGNOSIS — M25559 Pain in unspecified hip: Secondary | ICD-10-CM | POA: Diagnosis not present

## 2013-06-18 DIAGNOSIS — M5126 Other intervertebral disc displacement, lumbar region: Secondary | ICD-10-CM | POA: Diagnosis not present

## 2013-07-12 DIAGNOSIS — J45909 Unspecified asthma, uncomplicated: Secondary | ICD-10-CM | POA: Diagnosis not present

## 2013-07-23 ENCOUNTER — Ambulatory Visit: Payer: Self-pay | Admitting: Pain Medicine

## 2013-07-23 DIAGNOSIS — K449 Diaphragmatic hernia without obstruction or gangrene: Secondary | ICD-10-CM | POA: Diagnosis not present

## 2013-07-23 DIAGNOSIS — J449 Chronic obstructive pulmonary disease, unspecified: Secondary | ICD-10-CM | POA: Diagnosis not present

## 2013-07-23 DIAGNOSIS — Z79899 Other long term (current) drug therapy: Secondary | ICD-10-CM | POA: Diagnosis not present

## 2013-07-23 DIAGNOSIS — M76899 Other specified enthesopathies of unspecified lower limb, excluding foot: Secondary | ICD-10-CM | POA: Diagnosis not present

## 2013-07-23 DIAGNOSIS — F329 Major depressive disorder, single episode, unspecified: Secondary | ICD-10-CM | POA: Diagnosis not present

## 2013-07-23 DIAGNOSIS — M25559 Pain in unspecified hip: Secondary | ICD-10-CM | POA: Diagnosis not present

## 2013-07-23 DIAGNOSIS — F410 Panic disorder [episodic paroxysmal anxiety] without agoraphobia: Secondary | ICD-10-CM | POA: Diagnosis not present

## 2013-07-23 DIAGNOSIS — M810 Age-related osteoporosis without current pathological fracture: Secondary | ICD-10-CM | POA: Diagnosis not present

## 2013-07-23 DIAGNOSIS — IMO0002 Reserved for concepts with insufficient information to code with codable children: Secondary | ICD-10-CM | POA: Diagnosis not present

## 2013-07-23 DIAGNOSIS — M461 Sacroiliitis, not elsewhere classified: Secondary | ICD-10-CM | POA: Diagnosis not present

## 2013-07-23 DIAGNOSIS — M961 Postlaminectomy syndrome, not elsewhere classified: Secondary | ICD-10-CM | POA: Diagnosis not present

## 2013-07-23 DIAGNOSIS — M5137 Other intervertebral disc degeneration, lumbosacral region: Secondary | ICD-10-CM | POA: Diagnosis not present

## 2013-07-23 DIAGNOSIS — Z201 Contact with and (suspected) exposure to tuberculosis: Secondary | ICD-10-CM | POA: Diagnosis not present

## 2013-07-23 DIAGNOSIS — F411 Generalized anxiety disorder: Secondary | ICD-10-CM | POA: Diagnosis not present

## 2013-07-23 DIAGNOSIS — M1610 Unilateral primary osteoarthritis, unspecified hip: Secondary | ICD-10-CM | POA: Diagnosis not present

## 2013-07-23 DIAGNOSIS — G4700 Insomnia, unspecified: Secondary | ICD-10-CM | POA: Diagnosis not present

## 2013-07-23 DIAGNOSIS — G8929 Other chronic pain: Secondary | ICD-10-CM | POA: Diagnosis not present

## 2013-07-23 DIAGNOSIS — M169 Osteoarthritis of hip, unspecified: Secondary | ICD-10-CM | POA: Diagnosis not present

## 2013-07-23 DIAGNOSIS — K219 Gastro-esophageal reflux disease without esophagitis: Secondary | ICD-10-CM | POA: Diagnosis not present

## 2013-07-23 DIAGNOSIS — IMO0001 Reserved for inherently not codable concepts without codable children: Secondary | ICD-10-CM | POA: Diagnosis not present

## 2013-07-23 DIAGNOSIS — M5126 Other intervertebral disc displacement, lumbar region: Secondary | ICD-10-CM | POA: Diagnosis not present

## 2013-07-23 DIAGNOSIS — Z7982 Long term (current) use of aspirin: Secondary | ICD-10-CM | POA: Diagnosis not present

## 2013-07-23 DIAGNOSIS — F3289 Other specified depressive episodes: Secondary | ICD-10-CM | POA: Diagnosis not present

## 2013-07-23 DIAGNOSIS — M4716 Other spondylosis with myelopathy, lumbar region: Secondary | ICD-10-CM | POA: Diagnosis not present

## 2013-07-23 DIAGNOSIS — Z9889 Other specified postprocedural states: Secondary | ICD-10-CM | POA: Diagnosis not present

## 2013-07-23 DIAGNOSIS — G2581 Restless legs syndrome: Secondary | ICD-10-CM | POA: Diagnosis not present

## 2013-07-23 DIAGNOSIS — R5382 Chronic fatigue, unspecified: Secondary | ICD-10-CM | POA: Diagnosis not present

## 2013-07-23 DIAGNOSIS — I10 Essential (primary) hypertension: Secondary | ICD-10-CM | POA: Diagnosis not present

## 2013-07-23 DIAGNOSIS — G9332 Myalgic encephalomyelitis/chronic fatigue syndrome: Secondary | ICD-10-CM | POA: Diagnosis not present

## 2013-07-27 ENCOUNTER — Ambulatory Visit: Payer: Self-pay | Admitting: Pain Medicine

## 2013-07-27 DIAGNOSIS — M76899 Other specified enthesopathies of unspecified lower limb, excluding foot: Secondary | ICD-10-CM | POA: Diagnosis not present

## 2013-07-27 DIAGNOSIS — IMO0001 Reserved for inherently not codable concepts without codable children: Secondary | ICD-10-CM | POA: Diagnosis not present

## 2013-08-09 ENCOUNTER — Ambulatory Visit: Payer: Self-pay | Admitting: Pain Medicine

## 2013-08-09 DIAGNOSIS — Z201 Contact with and (suspected) exposure to tuberculosis: Secondary | ICD-10-CM | POA: Diagnosis not present

## 2013-08-09 DIAGNOSIS — IMO0002 Reserved for concepts with insufficient information to code with codable children: Secondary | ICD-10-CM | POA: Diagnosis not present

## 2013-08-09 DIAGNOSIS — G2581 Restless legs syndrome: Secondary | ICD-10-CM | POA: Diagnosis not present

## 2013-08-09 DIAGNOSIS — J449 Chronic obstructive pulmonary disease, unspecified: Secondary | ICD-10-CM | POA: Diagnosis not present

## 2013-08-09 DIAGNOSIS — F411 Generalized anxiety disorder: Secondary | ICD-10-CM | POA: Diagnosis not present

## 2013-08-09 DIAGNOSIS — Z7982 Long term (current) use of aspirin: Secondary | ICD-10-CM | POA: Diagnosis not present

## 2013-08-09 DIAGNOSIS — F410 Panic disorder [episodic paroxysmal anxiety] without agoraphobia: Secondary | ICD-10-CM | POA: Diagnosis not present

## 2013-08-09 DIAGNOSIS — Z9889 Other specified postprocedural states: Secondary | ICD-10-CM | POA: Diagnosis not present

## 2013-08-09 DIAGNOSIS — K219 Gastro-esophageal reflux disease without esophagitis: Secondary | ICD-10-CM | POA: Diagnosis not present

## 2013-08-09 DIAGNOSIS — F3289 Other specified depressive episodes: Secondary | ICD-10-CM | POA: Diagnosis not present

## 2013-08-09 DIAGNOSIS — M47817 Spondylosis without myelopathy or radiculopathy, lumbosacral region: Secondary | ICD-10-CM | POA: Diagnosis not present

## 2013-08-09 DIAGNOSIS — R5382 Chronic fatigue, unspecified: Secondary | ICD-10-CM | POA: Diagnosis not present

## 2013-08-09 DIAGNOSIS — M1610 Unilateral primary osteoarthritis, unspecified hip: Secondary | ICD-10-CM | POA: Diagnosis not present

## 2013-08-09 DIAGNOSIS — G9332 Myalgic encephalomyelitis/chronic fatigue syndrome: Secondary | ICD-10-CM | POA: Diagnosis not present

## 2013-08-09 DIAGNOSIS — IMO0001 Reserved for inherently not codable concepts without codable children: Secondary | ICD-10-CM | POA: Diagnosis not present

## 2013-08-09 DIAGNOSIS — M76899 Other specified enthesopathies of unspecified lower limb, excluding foot: Secondary | ICD-10-CM | POA: Diagnosis not present

## 2013-08-09 DIAGNOSIS — M5137 Other intervertebral disc degeneration, lumbosacral region: Secondary | ICD-10-CM | POA: Diagnosis not present

## 2013-08-09 DIAGNOSIS — G4700 Insomnia, unspecified: Secondary | ICD-10-CM | POA: Diagnosis not present

## 2013-08-09 DIAGNOSIS — G8929 Other chronic pain: Secondary | ICD-10-CM | POA: Diagnosis not present

## 2013-08-09 DIAGNOSIS — M169 Osteoarthritis of hip, unspecified: Secondary | ICD-10-CM | POA: Diagnosis not present

## 2013-08-09 DIAGNOSIS — M810 Age-related osteoporosis without current pathological fracture: Secondary | ICD-10-CM | POA: Diagnosis not present

## 2013-08-09 DIAGNOSIS — M4716 Other spondylosis with myelopathy, lumbar region: Secondary | ICD-10-CM | POA: Diagnosis not present

## 2013-08-09 DIAGNOSIS — M961 Postlaminectomy syndrome, not elsewhere classified: Secondary | ICD-10-CM | POA: Diagnosis not present

## 2013-08-09 DIAGNOSIS — F329 Major depressive disorder, single episode, unspecified: Secondary | ICD-10-CM | POA: Diagnosis not present

## 2013-08-09 DIAGNOSIS — Z79899 Other long term (current) drug therapy: Secondary | ICD-10-CM | POA: Diagnosis not present

## 2013-08-09 DIAGNOSIS — G894 Chronic pain syndrome: Secondary | ICD-10-CM | POA: Diagnosis not present

## 2013-08-09 DIAGNOSIS — K449 Diaphragmatic hernia without obstruction or gangrene: Secondary | ICD-10-CM | POA: Diagnosis not present

## 2013-08-09 DIAGNOSIS — M461 Sacroiliitis, not elsewhere classified: Secondary | ICD-10-CM | POA: Diagnosis not present

## 2013-08-09 DIAGNOSIS — I10 Essential (primary) hypertension: Secondary | ICD-10-CM | POA: Diagnosis not present

## 2013-08-09 DIAGNOSIS — F17200 Nicotine dependence, unspecified, uncomplicated: Secondary | ICD-10-CM | POA: Diagnosis not present

## 2013-09-08 ENCOUNTER — Encounter: Payer: Self-pay | Admitting: Obstetrics & Gynecology

## 2013-09-08 ENCOUNTER — Ambulatory Visit (INDEPENDENT_AMBULATORY_CARE_PROVIDER_SITE_OTHER): Payer: Medicare Other | Admitting: Obstetrics & Gynecology

## 2013-09-08 VITALS — BP 136/71 | HR 60 | Ht 61.0 in | Wt 157.6 lb

## 2013-09-08 DIAGNOSIS — N9489 Other specified conditions associated with female genital organs and menstrual cycle: Secondary | ICD-10-CM

## 2013-09-08 DIAGNOSIS — N816 Rectocele: Secondary | ICD-10-CM

## 2013-09-08 DIAGNOSIS — N898 Other specified noninflammatory disorders of vagina: Secondary | ICD-10-CM

## 2013-09-08 MED ORDER — ESTROGENS, CONJUGATED 0.625 MG/GM VA CREA: 1.0000 | TOPICAL_CREAM | Freq: Every day | VAGINAL | Status: DC

## 2013-09-08 NOTE — Patient Instructions (Signed)
Return to clinic for any scheduled appointments or for any gynecologic concerns as needed.   

## 2013-09-08 NOTE — Progress Notes (Signed)
   CLINIC ENCOUNTER NOTE  History:  68 y.o. F here today for evaluation of rectocele, vulvovaginal atrophy and vaginal discharge. Patient underwent TVH several years ago, but had TVT in 2001 and rectocele repair in 2009.  Reports having impacted stool especially when constipated, worries that her rectocele may have returned.  No pressure or other symptoms.  Also reports vaginal dryness and pain with intercourse, has used Premarin cream for several months but recently ran out. She desires refill.  Also reports vaginal discharge, worried about TVT mesh erosion.  The following portions of the patient's history were reviewed and updated as appropriate: allergies, current medications, past family history, past medical history, past social history, past surgical history and problem list. Normal mammograms every October at [facility].  Review of Systems:  Pertinent items are noted in HPI.  Objective:  Physical Exam BP 136/71  Pulse 60  Ht 5\' 1"  (1.549 m)  Wt 157 lb 9.6 oz (71.487 kg)  BMI 29.79 kg/m2 Gen: NAD Abd: Soft, nontender and nondistended Pelvic: Normal appearing external genitalia. Mild-moderate vulvovaginal atrophy. Good bladder support, no TVT mesh erosion noted.  No significant cystocele or rectocele noted even on Valsalva; hard stool palpated in rectum. Scant clear vaginal discharge seen, wet prep sample obtained.  Assessment & Plan:  Premarin prescribed Recommended OTC stool softners Follow up wet prep  Proper vulvar hygiene emphasized: discussed avoidance of perfumed soaps, detergents, lotions and any type of douches; in addition to wearing cotton underwear and no underwear at night.   If rectocele syumptoms persist, will need referral to [facility]. Patient will call if she desires this referrral.   Verita Schneiders, MD, [facility] Attending [place] for [place], [place]

## 2013-09-13 LAB — WET PREP, GENITAL
Clue Cells Wet Prep HPF POC: NONE SEEN
Trich, Wet Prep: NONE SEEN
WBC WET PREP: NONE SEEN
YEAST WET PREP: NONE SEEN

## 2013-09-23 ENCOUNTER — Ambulatory Visit: Payer: Self-pay | Admitting: Pain Medicine

## 2013-09-23 DIAGNOSIS — M76899 Other specified enthesopathies of unspecified lower limb, excluding foot: Secondary | ICD-10-CM | POA: Diagnosis not present

## 2013-09-23 DIAGNOSIS — M25559 Pain in unspecified hip: Secondary | ICD-10-CM | POA: Diagnosis not present

## 2013-10-12 DIAGNOSIS — H2510 Age-related nuclear cataract, unspecified eye: Secondary | ICD-10-CM | POA: Diagnosis not present

## 2013-10-12 DIAGNOSIS — H353190 Nonexudative age-related macular degeneration, unspecified eye, stage unspecified: Secondary | ICD-10-CM | POA: Diagnosis not present

## 2013-10-13 ENCOUNTER — Ambulatory Visit: Payer: Self-pay | Admitting: Pain Medicine

## 2013-10-13 DIAGNOSIS — M47817 Spondylosis without myelopathy or radiculopathy, lumbosacral region: Secondary | ICD-10-CM | POA: Diagnosis not present

## 2013-10-13 DIAGNOSIS — G894 Chronic pain syndrome: Secondary | ICD-10-CM | POA: Diagnosis not present

## 2013-10-13 DIAGNOSIS — M76899 Other specified enthesopathies of unspecified lower limb, excluding foot: Secondary | ICD-10-CM | POA: Diagnosis not present

## 2013-10-13 DIAGNOSIS — IMO0002 Reserved for concepts with insufficient information to code with codable children: Secondary | ICD-10-CM | POA: Diagnosis not present

## 2013-10-14 DIAGNOSIS — F341 Dysthymic disorder: Secondary | ICD-10-CM | POA: Diagnosis not present

## 2013-10-14 DIAGNOSIS — E782 Mixed hyperlipidemia: Secondary | ICD-10-CM | POA: Diagnosis not present

## 2013-10-14 DIAGNOSIS — Z1331 Encounter for screening for depression: Secondary | ICD-10-CM | POA: Diagnosis not present

## 2013-10-14 DIAGNOSIS — R609 Edema, unspecified: Secondary | ICD-10-CM | POA: Diagnosis not present

## 2013-10-14 DIAGNOSIS — I10 Essential (primary) hypertension: Secondary | ICD-10-CM | POA: Diagnosis not present

## 2013-10-14 DIAGNOSIS — G2581 Restless legs syndrome: Secondary | ICD-10-CM | POA: Diagnosis not present

## 2013-10-14 DIAGNOSIS — Z79899 Other long term (current) drug therapy: Secondary | ICD-10-CM | POA: Diagnosis not present

## 2013-10-20 DIAGNOSIS — L821 Other seborrheic keratosis: Secondary | ICD-10-CM | POA: Diagnosis not present

## 2013-10-29 DIAGNOSIS — L821 Other seborrheic keratosis: Secondary | ICD-10-CM | POA: Diagnosis not present

## 2013-10-29 DIAGNOSIS — Z4802 Encounter for removal of sutures: Secondary | ICD-10-CM | POA: Diagnosis not present

## 2013-10-29 DIAGNOSIS — J45901 Unspecified asthma with (acute) exacerbation: Secondary | ICD-10-CM | POA: Diagnosis not present

## 2013-11-12 DIAGNOSIS — J4520 Mild intermittent asthma, uncomplicated: Secondary | ICD-10-CM | POA: Diagnosis not present

## 2013-11-12 DIAGNOSIS — Z23 Encounter for immunization: Secondary | ICD-10-CM | POA: Diagnosis not present

## 2013-11-15 DIAGNOSIS — Z886 Allergy status to analgesic agent status: Secondary | ICD-10-CM | POA: Diagnosis not present

## 2013-11-15 DIAGNOSIS — I10 Essential (primary) hypertension: Secondary | ICD-10-CM | POA: Diagnosis not present

## 2013-11-15 DIAGNOSIS — S61216A Laceration without foreign body of right little finger without damage to nail, initial encounter: Secondary | ICD-10-CM | POA: Diagnosis not present

## 2013-11-15 DIAGNOSIS — J449 Chronic obstructive pulmonary disease, unspecified: Secondary | ICD-10-CM | POA: Diagnosis not present

## 2013-11-15 DIAGNOSIS — Z885 Allergy status to narcotic agent status: Secondary | ICD-10-CM | POA: Diagnosis not present

## 2013-11-15 DIAGNOSIS — Z87891 Personal history of nicotine dependence: Secondary | ICD-10-CM | POA: Diagnosis not present

## 2013-12-08 ENCOUNTER — Ambulatory Visit: Payer: Self-pay | Admitting: Pain Medicine

## 2013-12-08 DIAGNOSIS — M545 Low back pain: Secondary | ICD-10-CM | POA: Diagnosis not present

## 2013-12-08 DIAGNOSIS — G894 Chronic pain syndrome: Secondary | ICD-10-CM | POA: Diagnosis not present

## 2013-12-08 DIAGNOSIS — M5416 Radiculopathy, lumbar region: Secondary | ICD-10-CM | POA: Diagnosis not present

## 2013-12-08 DIAGNOSIS — M25552 Pain in left hip: Secondary | ICD-10-CM | POA: Diagnosis not present

## 2013-12-08 DIAGNOSIS — M47817 Spondylosis without myelopathy or radiculopathy, lumbosacral region: Secondary | ICD-10-CM | POA: Diagnosis not present

## 2013-12-08 DIAGNOSIS — M79606 Pain in leg, unspecified: Secondary | ICD-10-CM | POA: Diagnosis not present

## 2014-01-05 DIAGNOSIS — J069 Acute upper respiratory infection, unspecified: Secondary | ICD-10-CM | POA: Diagnosis not present

## 2014-01-12 DIAGNOSIS — Z79891 Long term (current) use of opiate analgesic: Secondary | ICD-10-CM | POA: Diagnosis not present

## 2014-01-12 DIAGNOSIS — M79605 Pain in left leg: Secondary | ICD-10-CM | POA: Diagnosis not present

## 2014-01-12 DIAGNOSIS — M5410 Radiculopathy, site unspecified: Secondary | ICD-10-CM | POA: Diagnosis not present

## 2014-01-12 DIAGNOSIS — G894 Chronic pain syndrome: Secondary | ICD-10-CM | POA: Diagnosis not present

## 2014-01-12 DIAGNOSIS — M47896 Other spondylosis, lumbar region: Secondary | ICD-10-CM | POA: Diagnosis not present

## 2014-01-12 DIAGNOSIS — G5792 Unspecified mononeuropathy of left lower limb: Secondary | ICD-10-CM | POA: Diagnosis not present

## 2014-01-12 DIAGNOSIS — M545 Low back pain: Secondary | ICD-10-CM | POA: Diagnosis not present

## 2014-01-12 DIAGNOSIS — M5416 Radiculopathy, lumbar region: Secondary | ICD-10-CM | POA: Diagnosis not present

## 2014-01-14 ENCOUNTER — Ambulatory Visit: Payer: Self-pay | Admitting: Pain Medicine

## 2014-01-14 DIAGNOSIS — M5416 Radiculopathy, lumbar region: Secondary | ICD-10-CM | POA: Diagnosis not present

## 2014-01-14 DIAGNOSIS — M545 Low back pain: Secondary | ICD-10-CM | POA: Diagnosis not present

## 2014-01-14 DIAGNOSIS — M5136 Other intervertebral disc degeneration, lumbar region: Secondary | ICD-10-CM | POA: Diagnosis not present

## 2014-01-14 DIAGNOSIS — M5116 Intervertebral disc disorders with radiculopathy, lumbar region: Secondary | ICD-10-CM | POA: Diagnosis not present

## 2014-02-03 DIAGNOSIS — J449 Chronic obstructive pulmonary disease, unspecified: Secondary | ICD-10-CM | POA: Diagnosis not present

## 2014-02-03 DIAGNOSIS — J019 Acute sinusitis, unspecified: Secondary | ICD-10-CM | POA: Diagnosis not present

## 2014-02-16 ENCOUNTER — Ambulatory Visit: Payer: Self-pay | Admitting: Family Medicine

## 2014-02-16 DIAGNOSIS — Z1231 Encounter for screening mammogram for malignant neoplasm of breast: Secondary | ICD-10-CM | POA: Diagnosis not present

## 2014-03-04 ENCOUNTER — Ambulatory Visit: Payer: Self-pay | Admitting: Pain Medicine

## 2014-03-04 DIAGNOSIS — M5416 Radiculopathy, lumbar region: Secondary | ICD-10-CM | POA: Diagnosis not present

## 2014-03-04 DIAGNOSIS — M545 Low back pain: Secondary | ICD-10-CM | POA: Diagnosis not present

## 2014-04-11 DIAGNOSIS — Z6828 Body mass index (BMI) 28.0-28.9, adult: Secondary | ICD-10-CM | POA: Diagnosis not present

## 2014-04-11 DIAGNOSIS — J309 Allergic rhinitis, unspecified: Secondary | ICD-10-CM | POA: Diagnosis not present

## 2014-04-14 DIAGNOSIS — M545 Low back pain: Secondary | ICD-10-CM | POA: Diagnosis not present

## 2014-04-14 DIAGNOSIS — M79605 Pain in left leg: Secondary | ICD-10-CM | POA: Diagnosis not present

## 2014-04-14 DIAGNOSIS — G894 Chronic pain syndrome: Secondary | ICD-10-CM | POA: Diagnosis not present

## 2014-04-14 DIAGNOSIS — Z79899 Other long term (current) drug therapy: Secondary | ICD-10-CM | POA: Diagnosis not present

## 2014-04-14 DIAGNOSIS — F419 Anxiety disorder, unspecified: Secondary | ICD-10-CM | POA: Diagnosis not present

## 2014-04-18 DIAGNOSIS — Z23 Encounter for immunization: Secondary | ICD-10-CM | POA: Diagnosis not present

## 2014-04-18 DIAGNOSIS — F418 Other specified anxiety disorders: Secondary | ICD-10-CM | POA: Diagnosis not present

## 2014-04-18 DIAGNOSIS — J019 Acute sinusitis, unspecified: Secondary | ICD-10-CM | POA: Diagnosis not present

## 2014-04-18 DIAGNOSIS — Z79899 Other long term (current) drug therapy: Secondary | ICD-10-CM | POA: Diagnosis not present

## 2014-04-18 DIAGNOSIS — R609 Edema, unspecified: Secondary | ICD-10-CM | POA: Diagnosis not present

## 2014-04-18 DIAGNOSIS — I10 Essential (primary) hypertension: Secondary | ICD-10-CM | POA: Diagnosis not present

## 2014-04-18 DIAGNOSIS — G4700 Insomnia, unspecified: Secondary | ICD-10-CM | POA: Diagnosis not present

## 2014-04-18 DIAGNOSIS — E782 Mixed hyperlipidemia: Secondary | ICD-10-CM | POA: Diagnosis not present

## 2014-04-25 DIAGNOSIS — J310 Chronic rhinitis: Secondary | ICD-10-CM | POA: Diagnosis not present

## 2014-04-25 DIAGNOSIS — J4520 Mild intermittent asthma, uncomplicated: Secondary | ICD-10-CM | POA: Diagnosis not present

## 2014-04-25 DIAGNOSIS — J209 Acute bronchitis, unspecified: Secondary | ICD-10-CM | POA: Diagnosis not present

## 2014-05-21 NOTE — Op Note (Signed)
PATIENT NAME:  [name], [name] MR#:  [id number] DATE OF BIRTH:  02-16-1945  DATE OF PROCEDURE:  05/25/2013  Location: Operating Room Referring Physician:  Kary Kos, M.D. Procedure by: Kathlen Brunswick. Dossie Arbour, M.D.  Note: This is the case of a 69 year old white female patient who comes into the same-day surgery today for placement of a bilateral lumbar spinal cord stimulator under fluoroscopic guidance and IV sedation. 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    Procedure(s):  1. Permanent implantation of Lumbar Epidural, Double Percutaneous Neurostimulator Leads (16 electrode array). 2. Implantation of Epidural Neurostimulator Generator. 3. Fluoroscopic Needle Guidance 4. Intraoperative Analysis and Programming. 5. Postoperative Analysis and Programming. 6. Moderate Conscious Sedation by the [facility] Anesthesia Team.  Surgeon: Kathlen Brunswick. Dossie Arbour, M.D. Side of implant: Bilateral Top electrode tip level: Upper endplate of T8, bilaterally. Diagnostic Indications: Chronic Lumbosacral Radiculopathy/Radiculitis. Failed Back Surgery Syndrome.  Position: Prone.  Prepping solution: DuraPrep Area prepped: The thoracolumbar and sacral areas, were prepped with a broad-spectrum topical antiseptic microbicide. Target area: Lumbar Epidural Space, around the T8-9 vertebral body, for the electrode tip. Insertion site is the L2-3 intervertebral space. Level entered: L2-3 Number of attempts: one  Infection Control: Standard Universal Precautions taken (Respiratory Hygiene/Cough Etiquette; Mouth, nose, eye protection; Hand Hygiene; Personal protective equipment (PPE); safe injection practices; and use of masks and disposable sterile surgical gloves) as recommended by the Department of [place] for Disease Control and Prevention (CDC).  Safety Measures: Allergies were reviewed. Appropriate  site, procedure, and patient were confirmed by following the Joint Commission's Universal Protocol (UP.01.01.01). The patient was asked to confirm marked site and procedure, before commencing. The patient was asked about blood thinners, or active infections, both of which were denied. No attempt was made at seeking any paresthesias. Aspiration looking for blood return was conducted prior to injecting. At no point did we inject any substances, as a needle was being advanced.  Pre-procedure Assessment:  A medical history and physical exam were obtained. Relevant documentation was reviewed and verified. Prior to the procedure, the patient was provided with an Audio CD, as well as written information on the procedure, including side-effects, and possible complications. Under the influence of no sedatives, a verbal, as well as a written informed consent were obtained, after having provided information on the risks and possible complications. To fulfill our ethical and legal obligations, as recommended by the American Medical Association's Code of Ethics, we have provided information to the patient about our clinical impression; the nature and purpose of an available treatment or procedure; the risks and benefits of an available treatment or procedure; alternatives; the risk and benefits of the alternative treatment or procedure; and the risks and benefits of not receiving or undergoing a treatment or procedure. The patient was provided information about the risks and possible complications associated with the procedure. These include, but not limited to, failure to achieve desired goals, infection, bleeding, organ or nerve damage, allergic reactions, paralysis, and death. In addition, the patient was informed that Medicine is not an exact science; therefore, there is also the possibility of unforeseen risks and possible complications that may result in a catastrophic outcome. The patient indicated having understood  very clearly.  We have given the patient no guarantees and we have made no promises. Ample time was given to the patient to ask questions, all of which were answered, to the patient's satisfaction, before proceeding. The  patient understands that by signing our informed consent form, they understand and accept the risks and the fact that it is impossible to predict all possible complications. Baseline vital signs were taken and the medical assessment was completed. Verification of the correct person, correct site (including marking of site), and correct procedure were performed and confirmed by the patient. Baseline vital signs were taken and the initial assessment was completed. Verification of the correct person, correct site (including marking of site), and correct procedure were performed and confirmed by the patient, in the form of a "Time Out".  Monitoring: The patient was monitored in the usual manner, using NIBPM, ECG, and pulse oximetry.  IV Access:  An IV access was obtained and secured.  Analgesia:  Moderate (Conscious) Intravenous sedation: Consent was obtained before administering any sedation. Availability of a responsible, adult driver, and NPO status confirmed. Meaningful verbal contact was maintained, with the patient at all times during the procedure. ASA Sedation Guidelines followed. For specifics on pharmacological type and quantity of sedation, please see nursing chart.  Prophylactic Antibiotics:  Clindamycin 600 mg IVPB.  Local Anesthesia: Lidocaine 1%. The skin over the procedure site were infiltrated using a 3 ml Luer-Lok syringe with a 0.5 inch, 25-G needle. Deeper tissues were infiltrated using a 3.0 inch, 22-G spinal needle, under fluoroscopic guidance.  Fluoroscopy: The patient was taken to the operative suite, where the patient was placed in position for the procedure, over the fluoroscopy compatible table. Fluoroscopy was manipulated, using "Tunnel Vision Technique", to  obtain the best possible view of the target area, on the affected side. Parallax error was corrected before commencing the procedure. Gabor Racz's "Direction-Depth-Direction" technique was used to introduce the procedural needle under continuous pulsed fluoroscopic guidance. Once the target was reached, antero-posterior and lateral fluoroscopic views were taken to confirm needle placement in two planes. Fluoroscopy time: Please see the patient's chart for details.  Description of the procedure: The procedure site was prepped using a broad-spectrum topical antiseptic. The area was then draped in the usual and standard manner. "Time-out" was performed as per JC Universal Protocol (UP.01.01.01).   A midline incision was made over the spinous processes, and hemostasis attained. The procedural needle was then introduced through the incision using Gabor Racz's "Direction-Depth-Direction" technique, under pulsed fluoroscopic guidance. No attempt was made at seeking a paresthesia. The paramidline approach was used to enter the posterior lumbar epidural space at a 30 degree angle, using "Loss-of-resistance Technique" with 3 ml of PF-NaCl (0.9% NSS) + 0.5 ml of air, in a 5 ml glass syringe, using a "loss-of-bounce technique", at the desired level. Correct needle placement was confirmed in the  antero-posterior and lateral fluoroscopic views. The epidural lead was gently introduced under real-time fluoroscopy, constantly assessing for pain or paresthesias, until the tip was observed to be at the target level, on the side ipsilateral to the pain. The placement of the second lead was accomplished in a similar fashion. Once the target was thought to have been reached, antero-posterior and lateral fluoroscopic views were taken to confirm electrode placement in two planes. Placement was tested until a comfortable stimulation pattern was observed over the usual painful area. Once the patient had assured [place] that the stimulation  was in the correct pattern, and distribution, we proceeded to remove the 15-G "Tuohy" epidural needle(s). This was done while observing the electrode tip(s) under real-time fluoroscopy to prevent movement. The patient was sedated again, and 1% lidocaine was infiltrated into the buttocks area, in order to  create the generator pocket. The extension was tunneled and connected to the generator. An impedance check was conducted after connecting all sections of the system. Once hemostasis was confirmed, both wounds were closed with Vicryl 2-0 after cleaning them with a solution containing 50:50 hydrogen peroxide and Betadine. Surgical staples were used to close the skin. The wounds were covered with sterile transparent bio-occlusive dressings, to easily assess any evidence of infection in the future.  The patient tolerated the entire procedure well. A repeat set of vitals were taken after the procedure and the patient was kept under observation until discharge criteria was met. The patient was provided with discharge instructions, including a section on how to identify potential problems. Should any problems arise concerning this procedure, the patient was given instructions to immediately contact us, without hesitation. The neurostimulator representative and I, both provided the patient with our Business cards containing our contact telephone numbers, and instructed the patient to contact either one of [place], at any time, should there be any problems or questions. In any case, we plan to contact the patient by telephone for a follow-up status report regarding this interventional procedure.  EBL: 20 ml  Complications: No heme; no paresthesias.  Disposition: Return to clinics in 10-11 days for removal of staples and postoperative evaluation.  Additional Comments/Plan: None.  Equipment used:   1.  Medtronic, Model Number [id number]; Lot Number [id number] (Lead Kit). 2.  Medtronic Lead Kit, Model Number [id number]; Lot  Number [id number]. 3.  Medtronic Generator, Model Number [id number]; Serial Number [id number].  Disclaimer: Medicine is not an Chief Strategy Officer. The only guarantee in medicine is that nothing is guaranteed. It is important to note that the decision to proceed with this intervention was based on the information collected from the patient. The Data and conclusions were drawn from the patient's questionnaire, the interview, and the physical examination. Because the information was provided in large part by the patient, it cannot be guaranteed that it has not been purposely or unconsciously manipulated. Every effort has been made to obtain as much relevant data as possible for this evaluation. It is important to note that the conclusions that lead to this procedure are derived in large part from the available data. Always take into account that the treatment will also be dependent on availability of resources and existing treatment guidelines, considered by other Pain Management Practitioners as being common knowledge and practice, at this time. For Medico-Legal purposes, it is also important to point out that variations in procedural techniques and pharmacological choices are the acceptable norm. The indications, contraindications, technique, and results of the above procedure should only be interpreted and judged by a Board-Certified Interventional Pain Specialist with extensive familiarity and expertise in the same exact procedure and technique, doing otherwise would be inappropriate and unethical.     ____________________________ Kathlen Brunswick. Dossie Arbour, MD fan:dmm D: 05/25/2013 19:22:25 ET T: 05/25/2013 20:14:12 ET JOB#: [id number]  cc: Shila Kruczek A. Dossie Arbour, MD, <Dictator> Gaspar Cola MD ELECTRONICALLY SIGNED 05/26/2013 12:36

## 2014-05-25 DIAGNOSIS — M47816 Spondylosis without myelopathy or radiculopathy, lumbar region: Secondary | ICD-10-CM | POA: Diagnosis not present

## 2014-05-25 DIAGNOSIS — Z79891 Long term (current) use of opiate analgesic: Secondary | ICD-10-CM | POA: Diagnosis not present

## 2014-05-25 DIAGNOSIS — G8929 Other chronic pain: Secondary | ICD-10-CM | POA: Diagnosis not present

## 2014-05-25 DIAGNOSIS — F329 Major depressive disorder, single episode, unspecified: Secondary | ICD-10-CM | POA: Diagnosis not present

## 2014-05-25 DIAGNOSIS — M488X6 Other specified spondylopathies, lumbar region: Secondary | ICD-10-CM | POA: Diagnosis not present

## 2014-05-25 DIAGNOSIS — M545 Low back pain: Secondary | ICD-10-CM | POA: Diagnosis not present

## 2014-06-01 DIAGNOSIS — R252 Cramp and spasm: Secondary | ICD-10-CM | POA: Diagnosis not present

## 2014-06-01 DIAGNOSIS — S9002XA Contusion of left ankle, initial encounter: Secondary | ICD-10-CM | POA: Diagnosis not present

## 2014-06-01 DIAGNOSIS — R300 Dysuria: Secondary | ICD-10-CM | POA: Diagnosis not present

## 2014-06-01 DIAGNOSIS — J309 Allergic rhinitis, unspecified: Secondary | ICD-10-CM | POA: Diagnosis not present

## 2014-06-01 DIAGNOSIS — Z6828 Body mass index (BMI) 28.0-28.9, adult: Secondary | ICD-10-CM | POA: Diagnosis not present

## 2014-07-08 DIAGNOSIS — Z6828 Body mass index (BMI) 28.0-28.9, adult: Secondary | ICD-10-CM | POA: Diagnosis not present

## 2014-07-08 DIAGNOSIS — L988 Other specified disorders of the skin and subcutaneous tissue: Secondary | ICD-10-CM | POA: Diagnosis not present

## 2014-07-08 DIAGNOSIS — F419 Anxiety disorder, unspecified: Secondary | ICD-10-CM | POA: Diagnosis not present

## 2014-08-18 DIAGNOSIS — L820 Inflamed seborrheic keratosis: Secondary | ICD-10-CM | POA: Diagnosis not present

## 2014-08-18 DIAGNOSIS — L821 Other seborrheic keratosis: Secondary | ICD-10-CM | POA: Diagnosis not present

## 2014-08-18 DIAGNOSIS — L578 Other skin changes due to chronic exposure to nonionizing radiation: Secondary | ICD-10-CM | POA: Diagnosis not present

## 2014-08-18 DIAGNOSIS — R208 Other disturbances of skin sensation: Secondary | ICD-10-CM | POA: Diagnosis not present

## 2014-08-24 DIAGNOSIS — Z79891 Long term (current) use of opiate analgesic: Secondary | ICD-10-CM | POA: Diagnosis not present

## 2014-08-26 ENCOUNTER — Ambulatory Visit: Payer: Self-pay | Admitting: Family Medicine

## 2014-08-30 DIAGNOSIS — J4520 Mild intermittent asthma, uncomplicated: Secondary | ICD-10-CM | POA: Diagnosis not present

## 2014-08-30 DIAGNOSIS — R0602 Shortness of breath: Secondary | ICD-10-CM | POA: Diagnosis not present

## 2014-09-15 DIAGNOSIS — Z9181 History of falling: Secondary | ICD-10-CM | POA: Diagnosis not present

## 2014-09-15 DIAGNOSIS — S99921A Unspecified injury of right foot, initial encounter: Secondary | ICD-10-CM | POA: Diagnosis not present

## 2014-09-15 DIAGNOSIS — M2580 Other specified joint disorders, unspecified joint: Secondary | ICD-10-CM | POA: Diagnosis not present

## 2014-09-15 DIAGNOSIS — M79674 Pain in right toe(s): Secondary | ICD-10-CM | POA: Diagnosis not present

## 2014-09-15 DIAGNOSIS — M7989 Other specified soft tissue disorders: Secondary | ICD-10-CM | POA: Diagnosis not present

## 2014-09-15 DIAGNOSIS — Z6826 Body mass index (BMI) 26.0-26.9, adult: Secondary | ICD-10-CM | POA: Diagnosis not present

## 2014-10-07 DIAGNOSIS — Z23 Encounter for immunization: Secondary | ICD-10-CM | POA: Diagnosis not present

## 2014-10-11 DIAGNOSIS — Z6826 Body mass index (BMI) 26.0-26.9, adult: Secondary | ICD-10-CM | POA: Diagnosis not present

## 2014-10-11 DIAGNOSIS — G43909 Migraine, unspecified, not intractable, without status migrainosus: Secondary | ICD-10-CM | POA: Diagnosis not present

## 2014-10-11 DIAGNOSIS — J309 Allergic rhinitis, unspecified: Secondary | ICD-10-CM | POA: Diagnosis not present

## 2014-10-13 ENCOUNTER — Encounter: Payer: Self-pay | Admitting: Podiatry

## 2014-10-13 ENCOUNTER — Telehealth: Payer: Self-pay | Admitting: *Deleted

## 2014-10-13 ENCOUNTER — Ambulatory Visit (INDEPENDENT_AMBULATORY_CARE_PROVIDER_SITE_OTHER): Payer: Medicare Other

## 2014-10-13 ENCOUNTER — Ambulatory Visit (INDEPENDENT_AMBULATORY_CARE_PROVIDER_SITE_OTHER): Payer: Medicare Other | Admitting: Podiatry

## 2014-10-13 DIAGNOSIS — M2580 Other specified joint disorders, unspecified joint: Secondary | ICD-10-CM | POA: Diagnosis not present

## 2014-10-13 DIAGNOSIS — R52 Pain, unspecified: Secondary | ICD-10-CM

## 2014-10-13 DIAGNOSIS — M779 Enthesopathy, unspecified: Secondary | ICD-10-CM

## 2014-10-13 NOTE — Telephone Encounter (Signed)
Pt states the pad Dr. Jacqualyn Posey cut, was cut upside down and won't stick.  I told pt the [place] office would be open 10/17/2014 and an assistant would be available during business hours to cut the pads.  Pt agreed.

## 2014-10-13 NOTE — Progress Notes (Signed)
   Subjective:    Patient ID: [name], female    DOB: 04/02/1945, 69 y.o.   MRN: [id number]  HPI  69 year old female presents the office today with pain to the ball of her right foot which has been ongoing for approximately 2 weeks. She states that she has noticed a swelling to the bottom of her foot in the ball of the big toe joint. She denies any redness. She gets some occasional numbness to her big toe and possibly to her second toe as well. She denies any history of injury or trauma. The pain came on gradually. She has seen her primary care physician for this and was referred to [place] or further evaluation. No other complaints at this time.   Review of Systems  Respiratory: Positive for cough, shortness of breath and wheezing.   Musculoskeletal: Positive for back pain.       Muscle pain  Skin:       Thick scars  Neurological: Positive for weakness.  Hematological: Bruises/bleeds easily.       Slow to heal  All other systems reviewed and are negative.      Objective:   Physical Exam AAO x3, NAD DP/PT pulses palpable bilaterally, CRT less than 3 seconds Protective sensation intact with Simms Weinstein monofilament, vibratory sensation intact, Achilles tendon reflex intact. There is some subjective numbness of the hallux and second toe however overall sensation intact. There is tenderness palpation on the medial sesamoid of the right foot. There is a small fluid-filled bursa type mass underlying the medial sesamoid. There is no scissoring erythema or increase in warmth. There is no pain with MTPJ range of motion. There is no specific area pinpoint bony tenderness. No pain along the second digit or with MTPJ range of motion.  No  other areas of tenderness to bilateral lower extremities. MMT 5/5, ROM WNL.  No open lesions or pre-ulcerative lesions.  No overlying edema, erythema, increase in warmth to bilateral lower extremities.  No pain with calf compression, swelling, warmth,  erythema bilaterally.      Assessment & Plan:  69 year old female with sesamoiditis, bursitis right foot -X-rays were obtained and reviewed with the patient.  -Treatment options discussed including all alternatives, risks, and complications -I discussed steroid injection on area of tenderness including risks and applications for which she understands verbally consents to. Under sterile conditions a total 1.2 mL of a mixture of Kenalog 10, 0.5% Marcaine plain and 2% lidocaine plain was infiltrated into the area of maximal tenderness on the plantar aspect of the right medial sesamoid. She tolerated injection well without any couple occasions. -Offloading pads were dispensed. -Anti-inflammatories as needed -Discussed with her that she'll likely benefit from custom orthotics. We'll discuss this further next appointment. -Follow-up in 4 weeks or sooner if any problems arise. In the meantime, encouraged to call the office with any questions, concerns, change in symptoms. Follow-up with PCP for other issues mentioned in the ROS.   Celesta Gentile, DPM

## 2014-10-17 DIAGNOSIS — H2513 Age-related nuclear cataract, bilateral: Secondary | ICD-10-CM | POA: Diagnosis not present

## 2014-10-17 DIAGNOSIS — H3531 Nonexudative age-related macular degeneration: Secondary | ICD-10-CM | POA: Diagnosis not present

## 2014-10-19 DIAGNOSIS — G4700 Insomnia, unspecified: Secondary | ICD-10-CM | POA: Diagnosis not present

## 2014-10-19 DIAGNOSIS — M8580 Other specified disorders of bone density and structure, unspecified site: Secondary | ICD-10-CM | POA: Diagnosis not present

## 2014-10-19 DIAGNOSIS — I10 Essential (primary) hypertension: Secondary | ICD-10-CM | POA: Diagnosis not present

## 2014-10-19 DIAGNOSIS — R946 Abnormal results of thyroid function studies: Secondary | ICD-10-CM | POA: Diagnosis not present

## 2014-10-19 DIAGNOSIS — R5383 Other fatigue: Secondary | ICD-10-CM | POA: Diagnosis not present

## 2014-10-19 DIAGNOSIS — E782 Mixed hyperlipidemia: Secondary | ICD-10-CM | POA: Diagnosis not present

## 2014-10-19 DIAGNOSIS — F418 Other specified anxiety disorders: Secondary | ICD-10-CM | POA: Diagnosis not present

## 2014-10-19 DIAGNOSIS — R609 Edema, unspecified: Secondary | ICD-10-CM | POA: Diagnosis not present

## 2014-10-19 DIAGNOSIS — R7989 Other specified abnormal findings of blood chemistry: Secondary | ICD-10-CM | POA: Diagnosis not present

## 2014-10-19 DIAGNOSIS — Z6826 Body mass index (BMI) 26.0-26.9, adult: Secondary | ICD-10-CM | POA: Diagnosis not present

## 2014-10-20 DIAGNOSIS — L820 Inflamed seborrheic keratosis: Secondary | ICD-10-CM | POA: Diagnosis not present

## 2014-10-20 DIAGNOSIS — L821 Other seborrheic keratosis: Secondary | ICD-10-CM | POA: Diagnosis not present

## 2014-10-21 DIAGNOSIS — M79673 Pain in unspecified foot: Secondary | ICD-10-CM

## 2014-10-31 ENCOUNTER — Other Ambulatory Visit: Payer: Self-pay | Admitting: Family Medicine

## 2014-10-31 DIAGNOSIS — E2839 Other primary ovarian failure: Secondary | ICD-10-CM

## 2014-11-10 ENCOUNTER — Ambulatory Visit: Payer: Medicare Other | Admitting: Podiatry

## 2014-11-15 ENCOUNTER — Ambulatory Visit (INDEPENDENT_AMBULATORY_CARE_PROVIDER_SITE_OTHER): Payer: Medicare Other | Admitting: Podiatry

## 2014-11-15 ENCOUNTER — Encounter: Payer: Self-pay | Admitting: Podiatry

## 2014-11-15 VITALS — BP 107/51 | HR 59 | Resp 18

## 2014-11-15 DIAGNOSIS — M779 Enthesopathy, unspecified: Secondary | ICD-10-CM | POA: Diagnosis not present

## 2014-11-15 DIAGNOSIS — M2580 Other specified joint disorders, unspecified joint: Secondary | ICD-10-CM | POA: Diagnosis not present

## 2014-11-19 NOTE — Progress Notes (Signed)
Patient ID: [name], female   DOB: 1945/04/19, 69 y.o.   MRN: [id number]  Subjective: Patient presents the office they for follow-up evaluation of right foot pain, sesamoiditis, bursitis. She states that after last appointment injection helped but did not last long. She states the swelling has decreased underlying the big toe joint. She denies any redness. She continues to have pain when she walks on a daily basis although she does feels an improvement. No recent injury or trauma. No other complaints at this time.   Objective: AAO 3, NAD DP/PT pulses palpable 2/4, CRT less than 3 seconds Protective sensation intact with Derrel Nip monofilament There is continuation of tenderness to the sesamoid complex and the right foot, mostly over the medial sesamoid. There is a faint amount of edema underlying this area although it appears to be significant improved compared to last a pointed. There is no overlying erythema or increase in warmth. There is no pain with MPJ range of motion. There is no pedal the metatarsal. There is no other areas of tenderness of bilateral lower extremities. No other areas of edema, erythema, increase in warmth. No pain with calf compression, swelling, warmth, erythema.  Assessment: 69 year old female resolving sesamoiditis right foot  Plan: -Treatment options discussed including all alternatives, risks, and complications -At this point she would like to have another steroid injection. I discussed risks, complications of repeated injection for which she understands verbally consents. Under sterile conditions a total 1.5 mL of a mixture of Kenalog 10, 0.5% Marcaine plain, and 2% lidocaine plain was infiltrated around the area of maximal alternatives without Complications. Post injection care was discussed. -Offloading pads dispensed -I discussed with her possible custom orthotics. She wishes to hold off on that at this time. -Follow-up if symptoms are not  resolving in 4 weeks or sooner if any problems are to arise. Call any questions or concerns in the meantime.  Celesta Gentile, DPM

## 2014-11-22 ENCOUNTER — Ambulatory Visit
Admission: RE | Admit: 2014-11-22 | Discharge: 2014-11-22 | Disposition: A | Discharge status: Home / Self Care | Payer: Medicare Other | Source: Ambulatory Visit | Attending: Family Medicine | Admitting: Family Medicine

## 2014-11-22 DIAGNOSIS — E2839 Other primary ovarian failure: Secondary | ICD-10-CM | POA: Diagnosis not present

## 2014-11-22 DIAGNOSIS — M85852 Other specified disorders of bone density and structure, left thigh: Secondary | ICD-10-CM | POA: Insufficient documentation

## 2014-11-22 DIAGNOSIS — M85831 Other specified disorders of bone density and structure, right forearm: Secondary | ICD-10-CM | POA: Insufficient documentation

## 2014-11-22 DIAGNOSIS — Z780 Asymptomatic menopausal state: Secondary | ICD-10-CM | POA: Diagnosis not present

## 2014-11-28 ENCOUNTER — Other Ambulatory Visit: Payer: Self-pay | Admitting: Pain Medicine

## 2014-11-28 ENCOUNTER — Ambulatory Visit: Payer: Medicare Other | Attending: Pain Medicine | Admitting: Pain Medicine

## 2014-11-28 ENCOUNTER — Encounter: Payer: Self-pay | Admitting: Pain Medicine

## 2014-11-28 VITALS — BP 125/92 | HR 64 | Temp 98.1°F | Resp 18 | Ht 61.0 in | Wt 135.0 lb

## 2014-11-28 DIAGNOSIS — K589 Irritable bowel syndrome without diarrhea: Secondary | ICD-10-CM

## 2014-11-28 DIAGNOSIS — M549 Dorsalgia, unspecified: Secondary | ICD-10-CM | POA: Insufficient documentation

## 2014-11-28 DIAGNOSIS — Z79891 Long term (current) use of opiate analgesic: Secondary | ICD-10-CM | POA: Diagnosis not present

## 2014-11-28 DIAGNOSIS — E669 Obesity, unspecified: Secondary | ICD-10-CM | POA: Diagnosis not present

## 2014-11-28 DIAGNOSIS — Z79899 Other long term (current) drug therapy: Secondary | ICD-10-CM | POA: Diagnosis not present

## 2014-11-28 DIAGNOSIS — M1288 Other specific arthropathies, not elsewhere classified, other specified site: Secondary | ICD-10-CM | POA: Insufficient documentation

## 2014-11-28 DIAGNOSIS — M545 Low back pain, unspecified: Secondary | ICD-10-CM

## 2014-11-28 DIAGNOSIS — M533 Sacrococcygeal disorders, not elsewhere classified: Secondary | ICD-10-CM

## 2014-11-28 DIAGNOSIS — K219 Gastro-esophageal reflux disease without esophagitis: Secondary | ICD-10-CM | POA: Diagnosis not present

## 2014-11-28 DIAGNOSIS — M810 Age-related osteoporosis without current pathological fracture: Secondary | ICD-10-CM

## 2014-11-28 DIAGNOSIS — M7062 Trochanteric bursitis, left hip: Secondary | ICD-10-CM | POA: Diagnosis not present

## 2014-11-28 DIAGNOSIS — J45909 Unspecified asthma, uncomplicated: Secondary | ICD-10-CM | POA: Insufficient documentation

## 2014-11-28 DIAGNOSIS — M961 Postlaminectomy syndrome, not elsewhere classified: Secondary | ICD-10-CM | POA: Diagnosis not present

## 2014-11-28 DIAGNOSIS — M539 Dorsopathy, unspecified: Secondary | ICD-10-CM

## 2014-11-28 DIAGNOSIS — Z8659 Personal history of other mental and behavioral disorders: Secondary | ICD-10-CM

## 2014-11-28 DIAGNOSIS — M47816 Spondylosis without myelopathy or radiculopathy, lumbar region: Secondary | ICD-10-CM

## 2014-11-28 DIAGNOSIS — I10 Essential (primary) hypertension: Secondary | ICD-10-CM | POA: Diagnosis not present

## 2014-11-28 DIAGNOSIS — Z87898 Personal history of other specified conditions: Secondary | ICD-10-CM

## 2014-11-28 DIAGNOSIS — Z969 Presence of functional implant, unspecified: Secondary | ICD-10-CM

## 2014-11-28 DIAGNOSIS — M797 Fibromyalgia: Secondary | ICD-10-CM

## 2014-11-28 DIAGNOSIS — J4520 Mild intermittent asthma, uncomplicated: Secondary | ICD-10-CM

## 2014-11-28 DIAGNOSIS — Z8669 Personal history of other diseases of the nervous system and sense organs: Secondary | ICD-10-CM

## 2014-11-28 DIAGNOSIS — M4726 Other spondylosis with radiculopathy, lumbar region: Secondary | ICD-10-CM

## 2014-11-28 DIAGNOSIS — M5416 Radiculopathy, lumbar region: Secondary | ICD-10-CM

## 2014-11-28 DIAGNOSIS — Z8709 Personal history of other diseases of the respiratory system: Secondary | ICD-10-CM

## 2014-11-28 DIAGNOSIS — K449 Diaphragmatic hernia without obstruction or gangrene: Secondary | ICD-10-CM

## 2014-11-28 DIAGNOSIS — G8929 Other chronic pain: Secondary | ICD-10-CM

## 2014-11-28 DIAGNOSIS — J449 Chronic obstructive pulmonary disease, unspecified: Secondary | ICD-10-CM | POA: Diagnosis not present

## 2014-11-28 DIAGNOSIS — M8580 Other specified disorders of bone density and structure, unspecified site: Secondary | ICD-10-CM | POA: Diagnosis not present

## 2014-11-28 DIAGNOSIS — Z201 Contact with and (suspected) exposure to tuberculosis: Secondary | ICD-10-CM

## 2014-11-28 DIAGNOSIS — F411 Generalized anxiety disorder: Secondary | ICD-10-CM

## 2014-11-28 DIAGNOSIS — G894 Chronic pain syndrome: Secondary | ICD-10-CM

## 2014-11-28 DIAGNOSIS — Z62819 Personal history of unspecified abuse in childhood: Secondary | ICD-10-CM

## 2014-11-28 DIAGNOSIS — M48061 Spinal stenosis, lumbar region without neurogenic claudication: Secondary | ICD-10-CM

## 2014-11-28 DIAGNOSIS — M47896 Other spondylosis, lumbar region: Secondary | ICD-10-CM

## 2014-11-28 DIAGNOSIS — G4700 Insomnia, unspecified: Secondary | ICD-10-CM | POA: Diagnosis not present

## 2014-11-28 DIAGNOSIS — F1120 Opioid dependence, uncomplicated: Secondary | ICD-10-CM

## 2014-11-28 DIAGNOSIS — M25559 Pain in unspecified hip: Secondary | ICD-10-CM

## 2014-11-28 DIAGNOSIS — M4806 Spinal stenosis, lumbar region: Secondary | ICD-10-CM

## 2014-11-28 DIAGNOSIS — G2581 Restless legs syndrome: Secondary | ICD-10-CM | POA: Diagnosis not present

## 2014-11-28 DIAGNOSIS — Z5181 Encounter for therapeutic drug level monitoring: Secondary | ICD-10-CM

## 2014-11-28 DIAGNOSIS — F1190 Opioid use, unspecified, uncomplicated: Secondary | ICD-10-CM | POA: Diagnosis not present

## 2014-11-28 DIAGNOSIS — M79605 Pain in left leg: Secondary | ICD-10-CM

## 2014-11-28 MED ORDER — OXYCODONE HCL 5 MG PO TABS: 5.0000 mg | ORAL_TABLET | Freq: Three times a day (TID) | ORAL | Status: DC | PRN

## 2014-11-28 MED ORDER — GABAPENTIN 300 MG PO CAPS: 300.0000 mg | ORAL_CAPSULE | Freq: Two times a day (BID) | ORAL | Status: DC

## 2014-11-28 MED ORDER — TIZANIDINE HCL 4 MG PO TABS: 4.0000 mg | ORAL_TABLET | Freq: Three times a day (TID) | ORAL | Status: DC | PRN

## 2014-11-28 NOTE — Progress Notes (Signed)
Safety precautions to be maintained throughout the outpatient stay will include: orient to surroundings, keep bed in low position, maintain call bell within reach at all times, provide assistance with transfer out of bed and ambulation.  Tizanidine 1/90 filled 10/02/14 Gabapentin 180/180/ filled 11/26/14 Oxycodone 5mg    39/90 filled 11/06/14
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
## 2014-11-29 ENCOUNTER — Encounter: Payer: Self-pay | Admitting: Pain Medicine

## 2014-11-29 DIAGNOSIS — K449 Diaphragmatic hernia without obstruction or gangrene: Secondary | ICD-10-CM | POA: Insufficient documentation

## 2014-11-29 DIAGNOSIS — K219 Gastro-esophageal reflux disease without esophagitis: Secondary | ICD-10-CM | POA: Insufficient documentation

## 2014-11-29 DIAGNOSIS — I10 Essential (primary) hypertension: Secondary | ICD-10-CM | POA: Insufficient documentation

## 2014-11-29 DIAGNOSIS — J45909 Unspecified asthma, uncomplicated: Secondary | ICD-10-CM | POA: Insufficient documentation

## 2014-11-29 DIAGNOSIS — Z201 Contact with and (suspected) exposure to tuberculosis: Secondary | ICD-10-CM | POA: Insufficient documentation

## 2014-11-29 DIAGNOSIS — G8929 Other chronic pain: Secondary | ICD-10-CM | POA: Insufficient documentation

## 2014-11-29 DIAGNOSIS — Z8669 Personal history of other diseases of the nervous system and sense organs: Secondary | ICD-10-CM | POA: Insufficient documentation

## 2014-11-29 DIAGNOSIS — M797 Fibromyalgia: Secondary | ICD-10-CM | POA: Insufficient documentation

## 2014-11-29 DIAGNOSIS — M47816 Spondylosis without myelopathy or radiculopathy, lumbar region: Secondary | ICD-10-CM | POA: Insufficient documentation

## 2014-11-29 DIAGNOSIS — M5416 Radiculopathy, lumbar region: Secondary | ICD-10-CM

## 2014-11-29 DIAGNOSIS — M25559 Pain in unspecified hip: Secondary | ICD-10-CM

## 2014-11-29 DIAGNOSIS — Z8659 Personal history of other mental and behavioral disorders: Secondary | ICD-10-CM | POA: Insufficient documentation

## 2014-11-29 DIAGNOSIS — Z62819 Personal history of unspecified abuse in childhood: Secondary | ICD-10-CM

## 2014-11-29 DIAGNOSIS — Z969 Presence of functional implant, unspecified: Secondary | ICD-10-CM | POA: Insufficient documentation

## 2014-11-29 DIAGNOSIS — M8580 Other specified disorders of bone density and structure, unspecified site: Secondary | ICD-10-CM | POA: Insufficient documentation

## 2014-11-29 DIAGNOSIS — Z8709 Personal history of other diseases of the respiratory system: Secondary | ICD-10-CM

## 2014-11-29 DIAGNOSIS — E669 Obesity, unspecified: Secondary | ICD-10-CM | POA: Insufficient documentation

## 2014-11-29 DIAGNOSIS — M533 Sacrococcygeal disorders, not elsewhere classified: Secondary | ICD-10-CM

## 2014-11-29 DIAGNOSIS — M79605 Pain in left leg: Secondary | ICD-10-CM | POA: Insufficient documentation

## 2014-11-29 DIAGNOSIS — M7062 Trochanteric bursitis, left hip: Secondary | ICD-10-CM | POA: Insufficient documentation

## 2014-11-29 DIAGNOSIS — M545 Low back pain: Secondary | ICD-10-CM

## 2014-11-29 DIAGNOSIS — K589 Irritable bowel syndrome without diarrhea: Secondary | ICD-10-CM | POA: Insufficient documentation

## 2014-11-29 DIAGNOSIS — M810 Age-related osteoporosis without current pathological fracture: Secondary | ICD-10-CM | POA: Insufficient documentation

## 2014-11-29 DIAGNOSIS — F1120 Opioid dependence, uncomplicated: Secondary | ICD-10-CM | POA: Insufficient documentation

## 2014-11-29 DIAGNOSIS — G2581 Restless legs syndrome: Secondary | ICD-10-CM | POA: Insufficient documentation

## 2014-11-29 DIAGNOSIS — M961 Postlaminectomy syndrome, not elsewhere classified: Secondary | ICD-10-CM | POA: Insufficient documentation

## 2014-11-29 DIAGNOSIS — Z5181 Encounter for therapeutic drug level monitoring: Secondary | ICD-10-CM | POA: Insufficient documentation

## 2014-11-29 DIAGNOSIS — M48061 Spinal stenosis, lumbar region without neurogenic claudication: Secondary | ICD-10-CM | POA: Insufficient documentation

## 2014-11-29 DIAGNOSIS — J449 Chronic obstructive pulmonary disease, unspecified: Secondary | ICD-10-CM | POA: Insufficient documentation

## 2014-11-29 DIAGNOSIS — F411 Generalized anxiety disorder: Secondary | ICD-10-CM | POA: Insufficient documentation

## 2014-11-29 DIAGNOSIS — Z87898 Personal history of other specified conditions: Secondary | ICD-10-CM | POA: Insufficient documentation

## 2014-11-29 DIAGNOSIS — G4700 Insomnia, unspecified: Secondary | ICD-10-CM | POA: Insufficient documentation

## 2014-11-29 HISTORY — DX: Personal history of other diseases of the respiratory system: Z87.09

## 2014-11-29 HISTORY — DX: Contact with and (suspected) exposure to tuberculosis: Z20.1

## 2014-11-29 HISTORY — DX: Personal history of unspecified abuse in childhood: Z62.819

## 2014-12-03 LAB — TOXASSURE SELECT 13 (MW), URINE: PDF: 0

## 2014-12-13 ENCOUNTER — Ambulatory Visit (INDEPENDENT_AMBULATORY_CARE_PROVIDER_SITE_OTHER): Payer: Medicare Other | Admitting: Podiatry

## 2014-12-13 ENCOUNTER — Encounter: Payer: Self-pay | Admitting: Podiatry

## 2014-12-13 VITALS — BP 105/44 | HR 81 | Resp 18

## 2014-12-13 DIAGNOSIS — M2580 Other specified joint disorders, unspecified joint: Secondary | ICD-10-CM | POA: Diagnosis not present

## 2014-12-13 DIAGNOSIS — M79673 Pain in unspecified foot: Secondary | ICD-10-CM

## 2014-12-13 NOTE — Progress Notes (Signed)
Patient ID: [name], female   DOB: [birth year], 69 y.o.   MRN: [id number]  Subjective: 69 year old female presents the office for follow up with vibration sesamoiditis of right foot. She states that her symptoms are greatly improved. She gets occasional discomfort at times although it does appear to be improved. She denies any swelling or redness from the area and the swelling has resolved. No recent injury or trauma. No redness overlying the area. No other complaints at this time in no acute changes since last appointment.  Objective:  AAO 3, NAD DP/PT pulses palpable 2/4, CRT less than 3 seconds Protective sensation appears to be intact with Derrel Nip monofilament The cuspid be prominences sesamoids on the right foot however this time there is no overlying edema, erythema, increase in warmth. There is very mild tenderness to palpation overlying the sesamoids however despaired be greatly improved compared to what it was. There is no pain with MPJ range of motion. There is no other areas of tenderness to bilateral lower extremities. There is no pain with calf compression, swelling, warmth, erythema.  Assessment: 69 year old female with resolving sesamoiditis right foot  Plan: -Treatment options discussed including all alternatives, risks, and complications -At this time the swelling has greatly improved and therefore we'll hold off on any further steroid injections. Discussed with her orthotics and shoe gear changes. Continue offloading pads. -Follow up with symptoms recur worsen. Meantime call the office with any questions, concerns, change in symptoms.  Celesta Gentile, DPM

## 2014-12-19 ENCOUNTER — Other Ambulatory Visit: Payer: Self-pay | Admitting: Pain Medicine

## 2014-12-27 ENCOUNTER — Other Ambulatory Visit: Payer: Self-pay | Admitting: Pain Medicine

## 2014-12-30 ENCOUNTER — Ambulatory Visit: Payer: Self-pay | Admitting: Family Medicine

## 2015-01-03 NOTE — Telephone Encounter (Signed)
Please call and assess to see what the issue is. Come back to me and report of findings. 

## 2015-01-13 DIAGNOSIS — S2020XA Contusion of thorax, unspecified, initial encounter: Secondary | ICD-10-CM | POA: Diagnosis not present

## 2015-01-13 DIAGNOSIS — Z6825 Body mass index (BMI) 25.0-25.9, adult: Secondary | ICD-10-CM | POA: Diagnosis not present

## 2015-01-13 DIAGNOSIS — J45901 Unspecified asthma with (acute) exacerbation: Secondary | ICD-10-CM | POA: Diagnosis not present

## 2015-01-27 DIAGNOSIS — R05 Cough: Secondary | ICD-10-CM | POA: Diagnosis not present

## 2015-01-27 DIAGNOSIS — J4520 Mild intermittent asthma, uncomplicated: Secondary | ICD-10-CM | POA: Diagnosis not present

## 2015-02-27 ENCOUNTER — Encounter (INDEPENDENT_AMBULATORY_CARE_PROVIDER_SITE_OTHER): Payer: Self-pay

## 2015-02-27 ENCOUNTER — Ambulatory Visit: Payer: Medicare Other | Attending: Pain Medicine | Admitting: Pain Medicine

## 2015-02-27 ENCOUNTER — Encounter: Payer: Self-pay | Admitting: Pain Medicine

## 2015-02-27 ENCOUNTER — Other Ambulatory Visit: Payer: Self-pay

## 2015-02-27 VITALS — BP 118/61 | HR 63 | Temp 97.7°F | Resp 16 | Ht 61.0 in | Wt 134.0 lb

## 2015-02-27 DIAGNOSIS — Z87440 Personal history of urinary (tract) infections: Secondary | ICD-10-CM | POA: Diagnosis not present

## 2015-02-27 DIAGNOSIS — I10 Essential (primary) hypertension: Secondary | ICD-10-CM | POA: Insufficient documentation

## 2015-02-27 DIAGNOSIS — K219 Gastro-esophageal reflux disease without esophagitis: Secondary | ICD-10-CM | POA: Insufficient documentation

## 2015-02-27 DIAGNOSIS — G8929 Other chronic pain: Secondary | ICD-10-CM | POA: Insufficient documentation

## 2015-02-27 DIAGNOSIS — M961 Postlaminectomy syndrome, not elsewhere classified: Secondary | ICD-10-CM

## 2015-02-27 DIAGNOSIS — F1120 Opioid dependence, uncomplicated: Secondary | ICD-10-CM | POA: Insufficient documentation

## 2015-02-27 DIAGNOSIS — M539 Dorsopathy, unspecified: Secondary | ICD-10-CM | POA: Diagnosis not present

## 2015-02-27 DIAGNOSIS — M8580 Other specified disorders of bone density and structure, unspecified site: Secondary | ICD-10-CM | POA: Insufficient documentation

## 2015-02-27 DIAGNOSIS — M47816 Spondylosis without myelopathy or radiculopathy, lumbar region: Secondary | ICD-10-CM

## 2015-02-27 DIAGNOSIS — E669 Obesity, unspecified: Secondary | ICD-10-CM | POA: Diagnosis not present

## 2015-02-27 DIAGNOSIS — J45909 Unspecified asthma, uncomplicated: Secondary | ICD-10-CM | POA: Diagnosis not present

## 2015-02-27 DIAGNOSIS — Z87891 Personal history of nicotine dependence: Secondary | ICD-10-CM | POA: Insufficient documentation

## 2015-02-27 DIAGNOSIS — M4806 Spinal stenosis, lumbar region: Secondary | ICD-10-CM | POA: Insufficient documentation

## 2015-02-27 DIAGNOSIS — Z5181 Encounter for therapeutic drug level monitoring: Secondary | ICD-10-CM

## 2015-02-27 DIAGNOSIS — J449 Chronic obstructive pulmonary disease, unspecified: Secondary | ICD-10-CM | POA: Insufficient documentation

## 2015-02-27 DIAGNOSIS — F411 Generalized anxiety disorder: Secondary | ICD-10-CM | POA: Diagnosis not present

## 2015-02-27 DIAGNOSIS — M810 Age-related osteoporosis without current pathological fracture: Secondary | ICD-10-CM | POA: Diagnosis not present

## 2015-02-27 DIAGNOSIS — M797 Fibromyalgia: Secondary | ICD-10-CM | POA: Insufficient documentation

## 2015-02-27 DIAGNOSIS — G2581 Restless legs syndrome: Secondary | ICD-10-CM | POA: Insufficient documentation

## 2015-02-27 DIAGNOSIS — M7062 Trochanteric bursitis, left hip: Secondary | ICD-10-CM | POA: Insufficient documentation

## 2015-02-27 DIAGNOSIS — Z79891 Long term (current) use of opiate analgesic: Secondary | ICD-10-CM | POA: Diagnosis not present

## 2015-02-27 DIAGNOSIS — Z201 Contact with and (suspected) exposure to tuberculosis: Secondary | ICD-10-CM | POA: Diagnosis not present

## 2015-02-27 DIAGNOSIS — M545 Low back pain: Secondary | ICD-10-CM

## 2015-02-27 DIAGNOSIS — M549 Dorsalgia, unspecified: Secondary | ICD-10-CM | POA: Diagnosis present

## 2015-02-27 MED ORDER — OXYCODONE HCL 5 MG PO TABS: 5.0000 mg | ORAL_TABLET | Freq: Three times a day (TID) | ORAL | Status: DC | PRN

## 2015-02-27 MED ORDER — GABAPENTIN 300 MG PO CAPS: 300.0000 mg | ORAL_CAPSULE | Freq: Two times a day (BID) | ORAL | Status: DC

## 2015-02-27 MED ORDER — TIZANIDINE HCL 4 MG PO TABS: 4.0000 mg | ORAL_TABLET | Freq: Three times a day (TID) | ORAL | Status: DC | PRN
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
## 2015-02-27 NOTE — Progress Notes (Signed)
Safety precautions to be maintained throughout the outpatient stay will include: orient to surroundings, keep bed in low position, maintain call bell within reach at all times, provide assistance with transfer out of bed and ambulation.  Pills remaining  27/90 Tizanidine 4mg  27/90 filled 12/27/14                           56/90 Oxycodone 5mg  56/90 filled 02/09/15 Here today for Med refill/Eval

## 2015-03-01 LAB — MICROSCOPIC EXAMINATION: Casts: NONE SEEN /lpf

## 2015-03-05 LAB — UA/M W/RFLX CULTURE, ROUTINE

## 2015-03-05 LAB — URINE CULTURE, REFLEX

## 2015-03-06 LAB — TOXASSURE SELECT 13 (MW), URINE: PDF: 0

## 2015-03-07 ENCOUNTER — Ambulatory Visit: Payer: Medicare Other | Attending: Pain Medicine | Admitting: Pain Medicine

## 2015-03-07 ENCOUNTER — Encounter: Payer: Self-pay | Admitting: Pain Medicine

## 2015-03-07 VITALS — BP 111/64 | HR 62 | Temp 98.3°F | Resp 20 | Ht 61.0 in | Wt 134.0 lb

## 2015-03-07 DIAGNOSIS — M4806 Spinal stenosis, lumbar region: Secondary | ICD-10-CM | POA: Diagnosis not present

## 2015-03-07 DIAGNOSIS — M79604 Pain in right leg: Secondary | ICD-10-CM | POA: Insufficient documentation

## 2015-03-07 DIAGNOSIS — M8580 Other specified disorders of bone density and structure, unspecified site: Secondary | ICD-10-CM | POA: Diagnosis not present

## 2015-03-07 DIAGNOSIS — I10 Essential (primary) hypertension: Secondary | ICD-10-CM | POA: Insufficient documentation

## 2015-03-07 DIAGNOSIS — K449 Diaphragmatic hernia without obstruction or gangrene: Secondary | ICD-10-CM | POA: Insufficient documentation

## 2015-03-07 DIAGNOSIS — M79605 Pain in left leg: Secondary | ICD-10-CM | POA: Diagnosis not present

## 2015-03-07 DIAGNOSIS — Z9889 Other specified postprocedural states: Secondary | ICD-10-CM | POA: Diagnosis not present

## 2015-03-07 DIAGNOSIS — M7062 Trochanteric bursitis, left hip: Secondary | ICD-10-CM | POA: Insufficient documentation

## 2015-03-07 DIAGNOSIS — K589 Irritable bowel syndrome without diarrhea: Secondary | ICD-10-CM | POA: Insufficient documentation

## 2015-03-07 DIAGNOSIS — K219 Gastro-esophageal reflux disease without esophagitis: Secondary | ICD-10-CM | POA: Diagnosis not present

## 2015-03-07 DIAGNOSIS — J449 Chronic obstructive pulmonary disease, unspecified: Secondary | ICD-10-CM | POA: Diagnosis not present

## 2015-03-07 DIAGNOSIS — J45909 Unspecified asthma, uncomplicated: Secondary | ICD-10-CM | POA: Diagnosis not present

## 2015-03-07 DIAGNOSIS — M47816 Spondylosis without myelopathy or radiculopathy, lumbar region: Secondary | ICD-10-CM | POA: Diagnosis not present

## 2015-03-07 DIAGNOSIS — M545 Low back pain, unspecified: Secondary | ICD-10-CM

## 2015-03-07 DIAGNOSIS — G8929 Other chronic pain: Secondary | ICD-10-CM | POA: Diagnosis not present

## 2015-03-07 DIAGNOSIS — M961 Postlaminectomy syndrome, not elsewhere classified: Secondary | ICD-10-CM | POA: Insufficient documentation

## 2015-03-07 DIAGNOSIS — E669 Obesity, unspecified: Secondary | ICD-10-CM | POA: Diagnosis not present

## 2015-03-07 DIAGNOSIS — F411 Generalized anxiety disorder: Secondary | ICD-10-CM | POA: Diagnosis not present

## 2015-03-07 DIAGNOSIS — F1190 Opioid use, unspecified, uncomplicated: Secondary | ICD-10-CM | POA: Insufficient documentation

## 2015-03-07 DIAGNOSIS — G2581 Restless legs syndrome: Secondary | ICD-10-CM | POA: Diagnosis not present

## 2015-03-07 DIAGNOSIS — M797 Fibromyalgia: Secondary | ICD-10-CM | POA: Diagnosis not present

## 2015-03-07 DIAGNOSIS — M549 Dorsalgia, unspecified: Secondary | ICD-10-CM | POA: Diagnosis present

## 2015-03-07 MED ORDER — FENTANYL CITRATE (PF) 100 MCG/2ML IJ SOLN
INTRAMUSCULAR | Status: AC
  Filled 2015-03-07: qty 2

## 2015-03-07 MED ORDER — TRIAMCINOLONE ACETONIDE 40 MG/ML IJ SUSP
INTRAMUSCULAR | Status: AC
  Administered 2015-03-07: 09:00:00
  Filled 2015-03-07: qty 1

## 2015-03-07 MED ORDER — MIDAZOLAM HCL 5 MG/5ML IJ SOLN
INTRAMUSCULAR | Status: AC
  Filled 2015-03-07: qty 5

## 2015-03-07 MED ORDER — TRIAMCINOLONE ACETONIDE 40 MG/ML IJ SUSP
40.0000 mg | Freq: Once | INTRAMUSCULAR | Status: DC
Start: 2015-03-07 — End: 2015-03-27

## 2015-03-07 MED ORDER — ROPIVACAINE HCL 2 MG/ML IJ SOLN: 9.0000 mL | Freq: Once | INTRAMUSCULAR | Status: DC

## 2015-03-07 MED ORDER — ROPIVACAINE HCL 2 MG/ML IJ SOLN
INTRAMUSCULAR | Status: AC
  Administered 2015-03-07: 09:00:00
  Filled 2015-03-07: qty 10

## 2015-03-07 MED ORDER — LIDOCAINE HCL (PF) 1 % IJ SOLN: 10.0000 mL | Freq: Once | INTRAMUSCULAR | Status: DC

## 2015-03-07 MED ORDER — TRIAMCINOLONE ACETONIDE 40 MG/ML IJ SUSP
INTRAMUSCULAR | Status: AC
Start: 2015-03-07 — End: 2015-03-07
  Administered 2015-03-07: 09:00:00
  Filled 2015-03-07: qty 1

## 2015-03-07 NOTE — Patient Instructions (Signed)
Pain Management Discharge Instructions  General Discharge Instructions :  If you need to reach your doctor call: Monday-Friday 8:00 am - 4:00 pm at [phone] or toll free [phone].  After clinic hours [phone] to have operator reach doctor.  Bring all of your medication bottles to all your appointments in the pain clinic.  To cancel or reschedule your appointment with Pain Management please remember to call 24 hours in advance to avoid a fee.  Refer to the educational materials which you have been given on: General Risks, I had my Procedure. Discharge Instructions, Post Sedation.  Post Procedure Instructions:  The drugs you were given will stay in your system until tomorrow, so for the next 24 hours you should not drive, make any legal decisions or drink any alcoholic beverages.  You may eat anything you prefer, but it is better to start with liquids then soups and crackers, and gradually work up to solid foods.  Please notify your doctor immediately if you have any unusual bleeding, trouble breathing or pain that is not related to your normal pain.  Depending on the type of procedure that was done, some parts of your body may feel week and/or numb.  This usually clears up by tonight or the next day.  Walk with the use of an assistive device or accompanied by an adult for the 24 hours.  You may use ice on the affected area for the first 24 hours.  Put ice in a Ziploc bag and cover with a towel and place against area 15 minutes on 15 minutes off.  You may switch to heat after 24 hours.Facet Joint Block The facet joints connect the bones of the spine (vertebrae). They make it possible for you to bend, twist, and make other movements with your spine. They also prevent you from overbending, overtwisting, and making other excessive movements.  A facet joint block is a procedure where a numbing medicine (anesthetic) is injected into a facet joint. Often, a type of anti-inflammatory  medicine called a steroid is also injected. A facet joint block may be done for two reasons:   Diagnosis. A facet joint block may be done as a test to see whether neck or back pain is caused by a worn-down or infected facet joint. If the pain gets better after a facet joint block, it means the pain is probably coming from the facet joint. If the pain does not get better, it means the pain is probably not coming from the facet joint.   Therapy. A facet joint block may be done to relieve neck or back pain caused by a facet joint. A facet joint block is only done as a therapy if the pain does not improve with medicine, exercise programs, physical therapy, and other forms of pain management. LET [facility] CARE PROVIDER KNOW ABOUT:   Any allergies you have.   All medicines you are taking, including vitamins, herbs, eyedrops, and over-the-counter medicines and creams.   Previous problems you or members of your family have had with the use of anesthetics.   Any blood disorders you have had.   Other health problems you have. RISKS AND COMPLICATIONS Generally, having a facet joint block is safe. However, as with any procedure, complications can occur. Possible complications associated with having a facet joint block include:   Bleeding.   Injury to a nerve near the injection site.   Pain at the injection site.   Weakness or numbness in areas controlled by nerves near  the injection site.   Infection.   Temporary fluid retention.   Allergic reaction to anesthetics or medicines used during the procedure. BEFORE THE PROCEDURE   Follow your health care provider's instructions if you are taking dietary supplements or medicines. You may need to stop taking them or reduce your dosage.   Do not take any new dietary supplements or medicines without asking your health care provider first.   Follow your health care provider's instructions about eating and drinking before the  procedure. You may need to stop eating and drinking several hours before the procedure.   Arrange to have an adult drive you home after the procedure. PROCEDURE  You may need to remove your clothing and dress in an open-back gown so that your health care provider can access your spine.   The procedure will be done while you are lying on an X-ray table. Most of the time you will be asked to lie on your stomach, but you may be asked to lie in a different position if an injection will be made in your neck.   Special machines will be used to monitor your oxygen levels, heart rate, and blood pressure.   If an injection will be made in your neck, an intravenous (IV) tube will be inserted into one of your veins. Fluids and medicine will flow directly into your body through the IV tube.   The area over the facet joint where the injection will be made will be cleaned with an antiseptic soap. The surrounding skin will be covered with sterile drapes.   An anesthetic will be applied to your skin to make the injection area numb. You may feel a temporary stinging or burning sensation.   A video X-ray machine will be used to locate the joint. A contrast dye may be injected into the facet joint area to help with locating the joint.   When the joint is located, an anesthetic medicine will be injected into the joint through the needle.   Your health care provider will ask you whether you feel pain relief. If you do feel relief, a steroid may be injected to provide pain relief for a longer period of time. If you do not feel relief or feel only partial relief, additional injections of an anesthetic may be made in other facet joints.   The needle will be removed, the skin will be cleansed, and bandages will be applied.  AFTER THE PROCEDURE   You will be observed for 15-30 minutes before being allowed to go home. Do not drive. Have an adult drive you or take a taxi or public transportation instead.    If you feel pain relief, the pain will return in several hours or days when the anesthetic wears off.   You may feel pain relief 2-14 days after the procedure. The amount of time this relief lasts varies from person to person.   It is normal to feel some tenderness over the injected area(s) for 2 days following the procedure.   If you have diabetes, you may have a temporary increase in blood sugar.   This information is not intended to replace advice given to you by your health care provider. Make sure you discuss any questions you have with your health care provider.   Document Released: 06/05/2006 Document Revised: 02/04/2014 Document Reviewed: 11/04/2011 Elsevier Interactive Patient Education 2016 [employer] GENERAL RISKS AND COMPLICATIONS  What are the risk, side effects and possible complications? Generally speaking, most procedures are   safe.  However, with any procedure there are risks, side effects, and the possibility of complications.  The risks and complications are dependent upon the sites that are lesioned, or the type of nerve block to be performed.  The closer the procedure is to the spine, the more serious the risks are.  Great care is taken when placing the radio frequency needles, block needles or lesioning probes, but sometimes complications can occur.  Infection: Any time there is an injection through the skin, there is a risk of infection.  This is why sterile conditions are used for these blocks.  There are four possible types of infection.  Localized skin infection.  Central Nervous System Infection-This can be in the form of Meningitis, which can be deadly.  Epidural Infections-This can be in the form of an epidural abscess, which can cause pressure inside of the spine, causing compression of the spinal cord with subsequent paralysis. This would require an emergency surgery to decompress, and there are no guarantees that the patient would recover from the  paralysis.  Discitis-This is an infection of the intervertebral discs.  It occurs in about 1% of discography procedures.  It is difficult to treat and it may lead to surgery.        2. Pain: the needles have to go through skin and soft tissues, will cause soreness.       3. Damage to internal structures:  The nerves to be lesioned may be near blood vessels or    other nerves which can be potentially damaged.       4. Bleeding: Bleeding is more common if the patient is taking blood thinners such as  aspirin, Coumadin, Ticiid, Plavix, etc., or if he/she have some genetic predisposition  such as hemophilia. Bleeding into the spinal canal can cause compression of the spinal  cord with subsequent paralysis.  This would require an emergency surgery to  decompress and there are no guarantees that the patient would recover from the  paralysis.       5. Pneumothorax:  Puncturing of a lung is a possibility, every time a needle is introduced in  the area of the chest or upper back.  Pneumothorax refers to free air around the  collapsed lung(s), inside of the thoracic cavity (chest cavity).  Another two possible  complications related to a similar event would include: Hemothorax and Chylothorax.   These are variations of the Pneumothorax, where instead of air around the collapsed  lung(s), you may have blood or chyle, respectively.       6. Spinal headaches: They may occur with any procedures in the area of the spine.       7. Persistent CSF (Cerebro-Spinal Fluid) leakage: This is a rare problem, but may occur  with prolonged intrathecal or epidural catheters either due to the formation of a fistulous  track or a dural tear.       8. Nerve damage: By working so close to the spinal cord, there is always a possibility of  nerve damage, which could be as serious as a permanent spinal cord injury with  paralysis.       9. Death:  Although rare, severe deadly allergic reactions known as "Anaphylactic  reaction" can occur  to any of the medications used.      10. Worsening of the symptoms:  We can always make thing worse.  What are the chances of something like this happening? Chances of any of this occuring are extremely   low.  By statistics, you have more of a chance of getting killed in a motor vehicle accident: while driving to the hospital than any of the above occurring .  Nevertheless, you should be aware that they are possibilities.  In general, it is similar to taking a shower.  Everybody knows that you can slip, hit your head and get killed.  Does that mean that you should not shower again?  Nevertheless always keep in mind that statistics do not mean anything if you happen to be on the wrong side of them.  Even if a procedure has a 1 (one) in a 1,000,000 (million) chance of going wrong, it you happen to be that one..Also, keep in mind that by statistics, you have more of a chance of having something go wrong when taking medications.  Who should not have this procedure? If you are on a blood thinning medication (e.g. Coumadin, Plavix, see list of "Blood Thinners"), or if you have an active infection going on, you should not have the procedure.  If you are taking any blood thinners, please inform your physician.  How should I prepare for this procedure?  Do not eat or drink anything at least six hours prior to the procedure.  Bring a driver with you .  It cannot be a taxi.  Come accompanied by an adult that can drive you back, and that is strong enough to help you if your legs get weak or numb from the local anesthetic.  Take all of your medicines the morning of the procedure with just enough water to swallow them.  If you have diabetes, make sure that you are scheduled to have your procedure done first thing in the morning, whenever possible.  If you have diabetes, take only half of your insulin dose and notify our nurse that you have done so as soon as you arrive at the clinic.  If you are diabetic,  but only take blood sugar pills (oral hypoglycemic), then do not take them on the morning of your procedure.  You may take them after you have had the procedure.  Do not take aspirin or any aspirin-containing medications, at least eleven (11) days prior to the procedure.  They may prolong bleeding.  Wear loose fitting clothing that may be easy to take off and that you would not mind if it got stained with Betadine or blood.  Do not wear any jewelry or perfume  Remove any nail coloring.  It will interfere with some of our monitoring equipment.  NOTE: Remember that this is not meant to be interpreted as a complete list of all possible complications.  Unforeseen problems may occur.  BLOOD THINNERS The following drugs contain aspirin or other products, which can cause increased bleeding during surgery and should not be taken for 2 weeks prior to and 1 week after surgery.  If you should need take something for relief of minor pain, you may take acetaminophen which is found in Tylenol,m Datril, Anacin-3 and Panadol. It is not blood thinner. The products listed below are.  Do not take any of the products listed below in addition to any listed on your instruction sheet.  A.P.C or A.P.C with Codeine Codeine Phosphate Capsules #3 Ibuprofen Ridaura  ABC compound Congesprin Imuran rimadil  Advil Cope Indocin Robaxisal  Alka-Seltzer Effervescent Pain Reliever and Antacid Coricidin or Coricidin-D  Indomethacin Rufen  Alka-Seltzer plus Cold Medicine Cosprin Ketoprofen S-A-C Tablets  Anacin Analgesic Tablets or Capsules Coumadin Korlgesic Salflex    Anacin Extra Strength Analgesic tablets or capsules CP-2 Tablets Lanoril Salicylate  Anaprox Cuprimine Capsules Levenox Salocol  Anexsia-D Dalteparin Magan Salsalate  Anodynos Darvon compound Magnesium Salicylate Sine-off  Ansaid Dasin Capsules Magsal Sodium Salicylate  Anturane Depen Capsules Marnal Soma  APF Arthritis pain formula Dewitt's Pills Measurin  Stanback  Argesic Dia-Gesic Meclofenamic Sulfinpyrazone  Arthritis Bayer Timed Release Aspirin Diclofenac Meclomen Sulindac  Arthritis pain formula Anacin Dicumarol Medipren Supac  Analgesic (Safety coated) Arthralgen Diffunasal Mefanamic Suprofen  Arthritis Strength Bufferin Dihydrocodeine Mepro Compound Suprol  Arthropan liquid Dopirydamole Methcarbomol with Aspirin Synalgos  ASA tablets/Enseals Disalcid Micrainin Tagament  Ascriptin Doan's Midol Talwin  Ascriptin A/D Dolene Mobidin Tanderil  Ascriptin Extra Strength Dolobid Moblgesic Ticlid  Ascriptin with Codeine Doloprin or Doloprin with Codeine Momentum Tolectin  Asperbuf Duoprin Mono-gesic Trendar  Aspergum Duradyne Motrin or Motrin IB Triminicin  Aspirin plain, buffered or enteric coated Durasal Myochrisine Trigesic  Aspirin Suppositories Easprin Nalfon Trillsate  Aspirin with Codeine Ecotrin Regular or Extra Strength Naprosyn Uracel  Atromid-S Efficin Naproxen Ursinus  Auranofin Capsules Elmiron Neocylate Vanquish  Axotal Emagrin Norgesic Verin  Azathioprine Empirin or Empirin with Codeine Normiflo Vitamin E  Azolid Emprazil Nuprin Voltaren  Bayer Aspirin plain, buffered or children's or timed BC Tablets or powders Encaprin Orgaran Warfarin Sodium  Buff-a-Comp Enoxaparin Orudis Zorpin  Buff-a-Comp with Codeine Equegesic Os-Cal-Gesic   Buffaprin Excedrin plain, buffered or Extra Strength Oxalid   Bufferin Arthritis Strength Feldene Oxphenbutazone   Bufferin plain or Extra Strength Feldene Capsules Oxycodone with Aspirin   Bufferin with Codeine Fenoprofen Fenoprofen Pabalate or Pabalate-SF   Buffets II Flogesic Panagesic   Buffinol plain or Extra Strength Florinal or Florinal with Codeine Panwarfarin   Buf-Tabs Flurbiprofen Penicillamine   Butalbital Compound Four-way cold tablets Penicillin   Butazolidin Fragmin Pepto-Bismol   Carbenicillin Geminisyn Percodan   Carna Arthritis Reliever Geopen Persantine   Carprofen Gold's  salt Persistin   Chloramphenicol Goody's Phenylbutazone   Chloromycetin Haltrain Piroxlcam   Clmetidine heparin Plaquenil   Cllnoril Hyco-pap Ponstel   Clofibrate Hydroxy chloroquine Propoxyphen         Before stopping any of these medications, be sure to consult the physician who ordered them.  Some, such as Coumadin (Warfarin) are ordered to prevent or treat serious conditions such as "deep thrombosis", "pumonary embolisms", and other heart problems.  The amount of time that you may need off of the medication may also vary with the medication and the reason for which you were taking it.  If you are taking any of these medications, please make sure you notify your pain physician before you undergo any procedures.          
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
## 2015-03-07 NOTE — Progress Notes (Signed)
Safety precautions to be maintained throughout the outpatient stay will include: orient to surroundings, keep bed in low position, maintain call bell within reach at all times, provide assistance with transfer out of bed and ambulation.  

## 2015-03-08 ENCOUNTER — Telehealth: Payer: Self-pay | Admitting: *Deleted

## 2015-03-08 NOTE — Telephone Encounter (Signed)
Spoke with patient verbalizes no complications from procedure on yesterday.

## 2015-03-27 ENCOUNTER — Encounter: Payer: Self-pay | Admitting: Pain Medicine

## 2015-03-27 ENCOUNTER — Ambulatory Visit: Payer: Medicare Other | Attending: Pain Medicine | Admitting: Pain Medicine

## 2015-03-27 VITALS — BP 113/95 | HR 61 | Temp 98.1°F | Resp 16 | Ht 61.0 in | Wt 134.0 lb

## 2015-03-27 DIAGNOSIS — J449 Chronic obstructive pulmonary disease, unspecified: Secondary | ICD-10-CM | POA: Insufficient documentation

## 2015-03-27 DIAGNOSIS — M545 Low back pain, unspecified: Secondary | ICD-10-CM

## 2015-03-27 DIAGNOSIS — G2581 Restless legs syndrome: Secondary | ICD-10-CM | POA: Insufficient documentation

## 2015-03-27 DIAGNOSIS — F1190 Opioid use, unspecified, uncomplicated: Secondary | ICD-10-CM | POA: Diagnosis not present

## 2015-03-27 DIAGNOSIS — Z79891 Long term (current) use of opiate analgesic: Secondary | ICD-10-CM | POA: Diagnosis not present

## 2015-03-27 DIAGNOSIS — G8929 Other chronic pain: Secondary | ICD-10-CM | POA: Diagnosis not present

## 2015-03-27 DIAGNOSIS — J45909 Unspecified asthma, uncomplicated: Secondary | ICD-10-CM | POA: Diagnosis not present

## 2015-03-27 DIAGNOSIS — M961 Postlaminectomy syndrome, not elsewhere classified: Secondary | ICD-10-CM | POA: Insufficient documentation

## 2015-03-27 DIAGNOSIS — M533 Sacrococcygeal disorders, not elsewhere classified: Secondary | ICD-10-CM | POA: Diagnosis not present

## 2015-03-27 DIAGNOSIS — K589 Irritable bowel syndrome without diarrhea: Secondary | ICD-10-CM | POA: Insufficient documentation

## 2015-03-27 DIAGNOSIS — F411 Generalized anxiety disorder: Secondary | ICD-10-CM | POA: Diagnosis not present

## 2015-03-27 DIAGNOSIS — M8580 Other specified disorders of bone density and structure, unspecified site: Secondary | ICD-10-CM | POA: Diagnosis not present

## 2015-03-27 DIAGNOSIS — E669 Obesity, unspecified: Secondary | ICD-10-CM | POA: Insufficient documentation

## 2015-03-27 DIAGNOSIS — M7062 Trochanteric bursitis, left hip: Secondary | ICD-10-CM | POA: Diagnosis not present

## 2015-03-27 DIAGNOSIS — K219 Gastro-esophageal reflux disease without esophagitis: Secondary | ICD-10-CM | POA: Insufficient documentation

## 2015-03-27 DIAGNOSIS — M4806 Spinal stenosis, lumbar region: Secondary | ICD-10-CM | POA: Insufficient documentation

## 2015-03-27 DIAGNOSIS — I10 Essential (primary) hypertension: Secondary | ICD-10-CM | POA: Diagnosis not present

## 2015-03-27 DIAGNOSIS — M47816 Spondylosis without myelopathy or radiculopathy, lumbar region: Secondary | ICD-10-CM

## 2015-03-27 DIAGNOSIS — M797 Fibromyalgia: Secondary | ICD-10-CM | POA: Diagnosis not present

## 2015-03-27 DIAGNOSIS — Z201 Contact with and (suspected) exposure to tuberculosis: Secondary | ICD-10-CM | POA: Insufficient documentation

## 2015-03-27 DIAGNOSIS — E785 Hyperlipidemia, unspecified: Secondary | ICD-10-CM | POA: Diagnosis not present

## 2015-03-27 DIAGNOSIS — Z87891 Personal history of nicotine dependence: Secondary | ICD-10-CM | POA: Diagnosis not present
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
## 2015-03-27 NOTE — Progress Notes (Signed)
Safety precautions to be maintained throughout the outpatient stay will include: orient to surroundings, keep bed in low position, maintain call bell within reach at all times, provide assistance with transfer out of bed and ambulation.  Here today for follow up from procedure- lower back pain is better- pt is having trouble with mid back pain when bending

## 2015-04-10 DIAGNOSIS — M47814 Spondylosis without myelopathy or radiculopathy, thoracic region: Secondary | ICD-10-CM | POA: Diagnosis not present

## 2015-04-10 DIAGNOSIS — N390 Urinary tract infection, site not specified: Secondary | ICD-10-CM | POA: Diagnosis not present

## 2015-04-10 DIAGNOSIS — R05 Cough: Secondary | ICD-10-CM | POA: Diagnosis not present

## 2015-04-10 DIAGNOSIS — R0602 Shortness of breath: Secondary | ICD-10-CM | POA: Diagnosis not present

## 2015-04-10 DIAGNOSIS — Z6826 Body mass index (BMI) 26.0-26.9, adult: Secondary | ICD-10-CM | POA: Diagnosis not present

## 2015-04-10 DIAGNOSIS — M549 Dorsalgia, unspecified: Secondary | ICD-10-CM | POA: Diagnosis not present

## 2015-04-10 DIAGNOSIS — D72829 Elevated white blood cell count, unspecified: Secondary | ICD-10-CM | POA: Diagnosis not present

## 2015-04-20 DIAGNOSIS — F419 Anxiety disorder, unspecified: Secondary | ICD-10-CM | POA: Diagnosis not present

## 2015-04-20 DIAGNOSIS — I10 Essential (primary) hypertension: Secondary | ICD-10-CM | POA: Diagnosis not present

## 2015-04-20 DIAGNOSIS — G2581 Restless legs syndrome: Secondary | ICD-10-CM | POA: Diagnosis not present

## 2015-04-20 DIAGNOSIS — E782 Mixed hyperlipidemia: Secondary | ICD-10-CM | POA: Diagnosis not present

## 2015-04-20 DIAGNOSIS — Z6826 Body mass index (BMI) 26.0-26.9, adult: Secondary | ICD-10-CM | POA: Diagnosis not present

## 2015-04-20 DIAGNOSIS — E0590 Thyrotoxicosis, unspecified without thyrotoxic crisis or storm: Secondary | ICD-10-CM | POA: Diagnosis not present

## 2015-04-20 DIAGNOSIS — N390 Urinary tract infection, site not specified: Secondary | ICD-10-CM | POA: Diagnosis not present

## 2015-04-20 DIAGNOSIS — E663 Overweight: Secondary | ICD-10-CM | POA: Diagnosis not present

## 2015-04-20 DIAGNOSIS — G4700 Insomnia, unspecified: Secondary | ICD-10-CM | POA: Diagnosis not present

## 2015-04-20 DIAGNOSIS — Z1231 Encounter for screening mammogram for malignant neoplasm of breast: Secondary | ICD-10-CM | POA: Diagnosis not present

## 2015-04-20 DIAGNOSIS — R609 Edema, unspecified: Secondary | ICD-10-CM | POA: Diagnosis not present

## 2015-04-21 ENCOUNTER — Other Ambulatory Visit: Payer: Self-pay | Admitting: Family Medicine

## 2015-04-21 DIAGNOSIS — Z1231 Encounter for screening mammogram for malignant neoplasm of breast: Secondary | ICD-10-CM

## 2015-05-04 ENCOUNTER — Ambulatory Visit: Payer: Medicare Other | Attending: Pain Medicine | Admitting: Pain Medicine

## 2015-05-04 ENCOUNTER — Encounter: Payer: Self-pay | Admitting: Pain Medicine

## 2015-05-04 VITALS — BP 109/65 | HR 57 | Temp 96.2°F | Resp 20 | Ht 61.0 in | Wt 140.0 lb

## 2015-05-04 DIAGNOSIS — M961 Postlaminectomy syndrome, not elsewhere classified: Secondary | ICD-10-CM | POA: Insufficient documentation

## 2015-05-04 DIAGNOSIS — R5382 Chronic fatigue, unspecified: Secondary | ICD-10-CM | POA: Diagnosis not present

## 2015-05-04 DIAGNOSIS — E669 Obesity, unspecified: Secondary | ICD-10-CM | POA: Diagnosis not present

## 2015-05-04 DIAGNOSIS — K219 Gastro-esophageal reflux disease without esophagitis: Secondary | ICD-10-CM | POA: Diagnosis not present

## 2015-05-04 DIAGNOSIS — M47816 Spondylosis without myelopathy or radiculopathy, lumbar region: Secondary | ICD-10-CM | POA: Diagnosis not present

## 2015-05-04 DIAGNOSIS — I10 Essential (primary) hypertension: Secondary | ICD-10-CM | POA: Insufficient documentation

## 2015-05-04 DIAGNOSIS — Z79891 Long term (current) use of opiate analgesic: Secondary | ICD-10-CM | POA: Insufficient documentation

## 2015-05-04 DIAGNOSIS — J449 Chronic obstructive pulmonary disease, unspecified: Secondary | ICD-10-CM | POA: Diagnosis not present

## 2015-05-04 DIAGNOSIS — M545 Low back pain, unspecified: Secondary | ICD-10-CM

## 2015-05-04 DIAGNOSIS — K589 Irritable bowel syndrome without diarrhea: Secondary | ICD-10-CM | POA: Diagnosis not present

## 2015-05-04 DIAGNOSIS — Z9689 Presence of other specified functional implants: Secondary | ICD-10-CM | POA: Diagnosis not present

## 2015-05-04 DIAGNOSIS — M7062 Trochanteric bursitis, left hip: Secondary | ICD-10-CM | POA: Diagnosis not present

## 2015-05-04 DIAGNOSIS — M47896 Other spondylosis, lumbar region: Secondary | ICD-10-CM | POA: Insufficient documentation

## 2015-05-04 DIAGNOSIS — M4806 Spinal stenosis, lumbar region: Secondary | ICD-10-CM | POA: Diagnosis not present

## 2015-05-04 DIAGNOSIS — M533 Sacrococcygeal disorders, not elsewhere classified: Secondary | ICD-10-CM | POA: Insufficient documentation

## 2015-05-04 DIAGNOSIS — G8929 Other chronic pain: Secondary | ICD-10-CM | POA: Diagnosis not present

## 2015-05-04 DIAGNOSIS — F411 Generalized anxiety disorder: Secondary | ICD-10-CM | POA: Diagnosis not present

## 2015-05-04 DIAGNOSIS — M8580 Other specified disorders of bone density and structure, unspecified site: Secondary | ICD-10-CM | POA: Insufficient documentation

## 2015-05-04 DIAGNOSIS — M25551 Pain in right hip: Secondary | ICD-10-CM | POA: Diagnosis not present

## 2015-05-04 DIAGNOSIS — M25552 Pain in left hip: Secondary | ICD-10-CM | POA: Insufficient documentation

## 2015-05-04 DIAGNOSIS — K449 Diaphragmatic hernia without obstruction or gangrene: Secondary | ICD-10-CM | POA: Insufficient documentation

## 2015-05-04 DIAGNOSIS — M810 Age-related osteoporosis without current pathological fracture: Secondary | ICD-10-CM | POA: Insufficient documentation

## 2015-05-04 DIAGNOSIS — G2581 Restless legs syndrome: Secondary | ICD-10-CM | POA: Diagnosis not present

## 2015-05-04 DIAGNOSIS — M797 Fibromyalgia: Secondary | ICD-10-CM | POA: Insufficient documentation

## 2015-05-04 DIAGNOSIS — J45909 Unspecified asthma, uncomplicated: Secondary | ICD-10-CM | POA: Insufficient documentation

## 2015-05-04 DIAGNOSIS — M549 Dorsalgia, unspecified: Secondary | ICD-10-CM | POA: Diagnosis present

## 2015-05-04 MED ORDER — ROPIVACAINE HCL 2 MG/ML IJ SOLN: 9.0000 mL | Freq: Once | INTRAMUSCULAR | Status: DC

## 2015-05-04 MED ORDER — TRIAMCINOLONE ACETONIDE 40 MG/ML IJ SUSP
INTRAMUSCULAR | Status: AC
  Administered 2015-05-04: 10:00:00
  Filled 2015-05-04: qty 2

## 2015-05-04 MED ORDER — LIDOCAINE HCL (PF) 1 % IJ SOLN: 10.0000 mL | Freq: Once | INTRAMUSCULAR | Status: DC

## 2015-05-04 MED ORDER — FENTANYL CITRATE (PF) 100 MCG/2ML IJ SOLN
INTRAMUSCULAR | Status: AC
  Administered 2015-05-04: 50 ug via INTRAVENOUS
  Filled 2015-05-04: qty 2

## 2015-05-04 MED ORDER — ROPIVACAINE HCL 2 MG/ML IJ SOLN
INTRAMUSCULAR | Status: AC
  Administered 2015-05-04: 10:00:00
  Filled 2015-05-04: qty 10

## 2015-05-04 MED ORDER — ROPIVACAINE HCL 2 MG/ML IJ SOLN
INTRAMUSCULAR | Status: AC
  Administered 2015-05-04: 10:00:00
  Filled 2015-05-04: qty 20

## 2015-05-04 MED ORDER — TRIAMCINOLONE ACETONIDE 40 MG/ML IJ SUSP: 40.0000 mg | Freq: Once | INTRAMUSCULAR | Status: DC

## 2015-05-04 MED ORDER — LACTATED RINGERS IV SOLN: 1000.0000 mL | INTRAVENOUS | Status: AC

## 2015-05-04 MED ORDER — MIDAZOLAM HCL 5 MG/5ML IJ SOLN: 5.0000 mg | INTRAMUSCULAR | Status: DC

## 2015-05-04 MED ORDER — TRIAMCINOLONE ACETONIDE 40 MG/ML IJ SUSP
INTRAMUSCULAR | Status: AC
  Administered 2015-05-04: 10:00:00
  Filled 2015-05-04: qty 1

## 2015-05-04 MED ORDER — FENTANYL CITRATE (PF) 100 MCG/2ML IJ SOLN: 100.0000 ug | INTRAMUSCULAR | Status: DC

## 2015-05-04 MED ORDER — MIDAZOLAM HCL 5 MG/5ML IJ SOLN
INTRAMUSCULAR | Status: AC
  Administered 2015-05-04: 2 mg via INTRAVENOUS
  Filled 2015-05-04: qty 5

## 2015-05-04 NOTE — Patient Instructions (Signed)
Pain Management Discharge Instructions  General Discharge Instructions :  If you need to reach your doctor call: Monday-Friday 8:00 am - 4:00 pm at [phone] or toll free [phone].  After clinic hours [phone] to have operator reach doctor.  Bring all of your medication bottles to all your appointments in the pain clinic.  To cancel or reschedule your appointment with Pain Management please remember to call 24 hours in advance to avoid a fee.  Refer to the educational materials which you have been given on: General Risks, I had my Procedure. Discharge Instructions, Post Sedation.  Post Procedure Instructions:  The drugs you were given will stay in your system until tomorrow, so for the next 24 hours you should not drive, make any legal decisions or drink any alcoholic beverages.  You may eat anything you prefer, but it is better to start with liquids then soups and crackers, and gradually work up to solid foods.  Please notify your doctor immediately if you have any unusual bleeding, trouble breathing or pain that is not related to your normal pain.  Depending on the type of procedure that was done, some parts of your body may feel week and/or numb.  This usually clears up by tonight or the next day.  Walk with the use of an assistive device or accompanied by an adult for the 24 hours.  You may use ice on the affected area for the first 24 hours.  Put ice in a Ziploc bag and cover with a towel and place against area 15 minutes on 15 minutes off.  You may switch to heat after 24 hours.GENERAL RISKS AND COMPLICATIONS  What are the risk, side effects and possible complications? Generally speaking, most procedures are safe.  However, with any procedure there are risks, side effects, and the possibility of complications.  The risks and complications are dependent upon the sites that are lesioned, or the type of nerve block to be performed.  The closer the procedure is to the spine,  the more serious the risks are.  Great care is taken when placing the radio frequency needles, block needles or lesioning probes, but sometimes complications can occur. 1. Infection: Any time there is an injection through the skin, there is a risk of infection.  This is why sterile conditions are used for these blocks.  There are four possible types of infection. 1. Localized skin infection. 2. Central Nervous System Infection-This can be in the form of Meningitis, which can be deadly. 3. Epidural Infections-This can be in the form of an epidural abscess, which can cause pressure inside of the spine, causing compression of the spinal cord with subsequent paralysis. This would require an emergency surgery to decompress, and there are no guarantees that the patient would recover from the paralysis. 4. Discitis-This is an infection of the intervertebral discs.  It occurs in about 1% of discography procedures.  It is difficult to treat and it may lead to surgery.        2. Pain: the needles have to go through skin and soft tissues, will cause soreness.       3. Damage to internal structures:  The nerves to be lesioned may be near blood vessels or    other nerves which can be potentially damaged.       4. Bleeding: Bleeding is more common if the patient is taking blood thinners such as  aspirin, Coumadin, Ticiid, Plavix, etc., or if he/she have some genetic predisposition  such as   hemophilia. Bleeding into the spinal canal can cause compression of the spinal  cord with subsequent paralysis.  This would require an emergency surgery to  decompress and there are no guarantees that the patient would recover from the  paralysis.       5. Pneumothorax:  Puncturing of a lung is a possibility, every time a needle is introduced in  the area of the chest or upper back.  Pneumothorax refers to free air around the  collapsed lung(s), inside of the thoracic cavity (chest cavity).  Another two possible  complications  related to a similar event would include: Hemothorax and Chylothorax.   These are variations of the Pneumothorax, where instead of air around the collapsed  lung(s), you may have blood or chyle, respectively.       6. Spinal headaches: They may occur with any procedures in the area of the spine.       7. Persistent CSF (Cerebro-Spinal Fluid) leakage: This is a rare problem, but may occur  with prolonged intrathecal or epidural catheters either due to the formation of a fistulous  track or a dural tear.       8. Nerve damage: By working so close to the spinal cord, there is always a possibility of  nerve damage, which could be as serious as a permanent spinal cord injury with  paralysis.       9. Death:  Although rare, severe deadly allergic reactions known as "Anaphylactic  reaction" can occur to any of the medications used.      10. Worsening of the symptoms:  We can always make thing worse.  What are the chances of something like this happening? Chances of any of this occuring are extremely low.  By statistics, you have more of a chance of getting killed in a motor vehicle accident: while driving to the hospital than any of the above occurring .  Nevertheless, you should be aware that they are possibilities.  In general, it is similar to taking a shower.  Everybody knows that you can slip, hit your head and get killed.  Does that mean that you should not shower again?  Nevertheless always keep in mind that statistics do not mean anything if you happen to be on the wrong side of them.  Even if a procedure has a 1 (one) in a 1,000,000 (million) chance of going wrong, it you happen to be that one..Also, keep in mind that by statistics, you have more of a chance of having something go wrong when taking medications.  Who should not have this procedure? If you are on a blood thinning medication (e.g. Coumadin, Plavix, see list of "Blood Thinners"), or if you have an active infection going on, you should not  have the procedure.  If you are taking any blood thinners, please inform your physician.  How should I prepare for this procedure?  Do not eat or drink anything at least six hours prior to the procedure.  Bring a driver with you .  It cannot be a taxi.  Come accompanied by an adult that can drive you back, and that is strong enough to help you if your legs get weak or numb from the local anesthetic.  Take all of your medicines the morning of the procedure with just enough water to swallow them.  If you have diabetes, make sure that you are scheduled to have your procedure done first thing in the morning, whenever possible.  If you have diabetes,   take only half of your insulin dose and notify our nurse that you have done so as soon as you arrive at the clinic.  If you are diabetic, but only take blood sugar pills (oral hypoglycemic), then do not take them on the morning of your procedure.  You may take them after you have had the procedure.  Do not take aspirin or any aspirin-containing medications, at least eleven (11) days prior to the procedure.  They may prolong bleeding.  Wear loose fitting clothing that may be easy to take off and that you would not mind if it got stained with Betadine or blood.  Do not wear any jewelry or perfume  Remove any nail coloring.  It will interfere with some of our monitoring equipment.  NOTE: Remember that this is not meant to be interpreted as a complete list of all possible complications.  Unforeseen problems may occur.  BLOOD THINNERS The following drugs contain aspirin or other products, which can cause increased bleeding during surgery and should not be taken for 2 weeks prior to and 1 week after surgery.  If you should need take something for relief of minor pain, you may take acetaminophen which is found in Tylenol,m Datril, Anacin-3 and Panadol. It is not blood thinner. The products listed below are.  Do not take any of the products listed below  in addition to any listed on your instruction sheet.  A.P.C or A.P.C with Codeine Codeine Phosphate Capsules #3 Ibuprofen Ridaura  ABC compound Congesprin Imuran rimadil  Advil Cope Indocin Robaxisal  Alka-Seltzer Effervescent Pain Reliever and Antacid Coricidin or Coricidin-D  Indomethacin Rufen  Alka-Seltzer plus Cold Medicine Cosprin Ketoprofen S-A-C Tablets  Anacin Analgesic Tablets or Capsules Coumadin Korlgesic Salflex  Anacin Extra Strength Analgesic tablets or capsules CP-2 Tablets Lanoril Salicylate  Anaprox Cuprimine Capsules Levenox Salocol  Anexsia-D Dalteparin Magan Salsalate  Anodynos Darvon compound Magnesium Salicylate Sine-off  Ansaid Dasin Capsules Magsal Sodium Salicylate  Anturane Depen Capsules Marnal Soma  APF Arthritis pain formula Dewitt's Pills Measurin Stanback  Argesic Dia-Gesic Meclofenamic Sulfinpyrazone  Arthritis Bayer Timed Release Aspirin Diclofenac Meclomen Sulindac  Arthritis pain formula Anacin Dicumarol Medipren Supac  Analgesic (Safety coated) Arthralgen Diffunasal Mefanamic Suprofen  Arthritis Strength Bufferin Dihydrocodeine Mepro Compound Suprol  Arthropan liquid Dopirydamole Methcarbomol with Aspirin Synalgos  ASA tablets/Enseals Disalcid Micrainin Tagament  Ascriptin Doan's Midol Talwin  Ascriptin A/D Dolene Mobidin Tanderil  Ascriptin Extra Strength Dolobid Moblgesic Ticlid  Ascriptin with Codeine Doloprin or Doloprin with Codeine Momentum Tolectin  Asperbuf Duoprin Mono-gesic Trendar  Aspergum Duradyne Motrin or Motrin IB Triminicin  Aspirin plain, buffered or enteric coated Durasal Myochrisine Trigesic  Aspirin Suppositories Easprin Nalfon Trillsate  Aspirin with Codeine Ecotrin Regular or Extra Strength Naprosyn Uracel  Atromid-S Efficin Naproxen Ursinus  Auranofin Capsules Elmiron Neocylate Vanquish  Axotal Emagrin Norgesic Verin  Azathioprine Empirin or Empirin with Codeine Normiflo Vitamin E  Azolid Emprazil Nuprin Voltaren  Bayer  Aspirin plain, buffered or children's or timed BC Tablets or powders Encaprin Orgaran Warfarin Sodium  Buff-a-Comp Enoxaparin Orudis Zorpin  Buff-a-Comp with Codeine Equegesic Os-Cal-Gesic   Buffaprin Excedrin plain, buffered or Extra Strength Oxalid   Bufferin Arthritis Strength Feldene Oxphenbutazone   Bufferin plain or Extra Strength Feldene Capsules Oxycodone with Aspirin   Bufferin with Codeine Fenoprofen Fenoprofen Pabalate or Pabalate-SF   Buffets II Flogesic Panagesic   Buffinol plain or Extra Strength Florinal or Florinal with Codeine Panwarfarin   Buf-Tabs Flurbiprofen Penicillamine   Butalbital Compound Four-way cold tablets   Penicillin   Butazolidin Fragmin Pepto-Bismol   Carbenicillin Geminisyn Percodan   Carna Arthritis Reliever Geopen Persantine   Carprofen Gold's salt Persistin   Chloramphenicol Goody's Phenylbutazone   Chloromycetin Haltrain Piroxlcam   Clmetidine heparin Plaquenil   Cllnoril Hyco-pap Ponstel   Clofibrate Hydroxy chloroquine Propoxyphen         Before stopping any of these medications, be sure to consult the physician who ordered them.  Some, such as Coumadin (Warfarin) are ordered to prevent or treat serious conditions such as "deep thrombosis", "pumonary embolisms", and other heart problems.  The amount of time that you may need off of the medication may also vary with the medication and the reason for which you were taking it.  If you are taking any of these medications, please make sure you notify your pain physician before you undergo any procedures.         Facet Joint Block, Care After Refer to this sheet in the next few weeks. These instructions provide you with information on caring for yourself after your procedure. Your health care provider may also give you more specific instructions. Your treatment has been planned according to current medical practices, but problems sometimes occur. Call your health care provider if you have any problems  or questions after your procedure. HOME CARE INSTRUCTIONS  2. Keep track of the amount of pain relief you feel and how long it lasts. 3. Limit pain medicine within the first 4-6 hours after the procedure as directed by your health care provider. 4. Resume taking dietary supplements and medicines as directed by your health care provider. 5. You may resume your regular diet. 6. Do not apply heat near or over the injection site(s) for 24 hours.  7. Do not take a bath or soak in water (such as a pool or lake) for 24 hours. 8. Do not drive for 24 hours unless approved by your health care provider. 9. Avoid strenuous activity for 24 hours. 10. Remove your bandages the morning after the procedure.  11. If the injection site is tender, applying an ice pack may relieve some tenderness. To do this: 1. Put ice in a bag. 2. Place a towel between your skin and the bag. 3. Leave the ice on for 15-20 minutes, 3-4 times a day. 12. Keep follow-up appointments as directed by your health care provider. SEEK MEDICAL CARE IF:   Your pain is not controlled by your medicines.   There is drainage from the injection site.   There is significant bleeding or swelling at the injection site.  You have diabetes and your blood sugar is above 180 mg/dL. SEEK IMMEDIATE MEDICAL CARE IF:   You develop a fever of 101F (38.3C) or greater.   You have worsening pain or swelling around the injection site.   You have red streaking around the injection site.   You develop severe pain that is not controlled by your medicines.   You develop a headache, stiff neck, nausea, or vomiting.   Your eyes become very sensitive to light.   You have weakness, paralysis, or tingling in your arms or legs that was not present before the procedure.   You develop difficulty urinating or breathing.    This information is not intended to replace advice given to you by your health care provider. Make sure you discuss any  questions you have with your health care provider.   Document Released: 01/01/2012 Document Revised: 02/04/2014 Document Reviewed: 01/01/2012 Elsevier Interactive Patient Education 2016   [employer] Facet Joint Block The facet joints connect the bones of the spine (vertebrae). They make it possible for you to bend, twist, and make other movements with your spine. They also prevent you from overbending, overtwisting, and making other excessive movements.  A facet joint block is a procedure where a numbing medicine (anesthetic) is injected into a facet joint. Often, a type of anti-inflammatory medicine called a steroid is also injected. A facet joint block may be done for two reasons:  13. Diagnosis. A facet joint block may be done as a test to see whether neck or back pain is caused by a worn-down or infected facet joint. If the pain gets better after a facet joint block, it means the pain is probably coming from the facet joint. If the pain does not get better, it means the pain is probably not coming from the facet joint.  14. Therapy. A facet joint block may be done to relieve neck or back pain caused by a facet joint. A facet joint block is only done as a therapy if the pain does not improve with medicine, exercise programs, physical therapy, and other forms of pain management. LET YOUR HEALTH CARE PROVIDER KNOW ABOUT:   Any allergies you have.   All medicines you are taking, including vitamins, herbs, eyedrops, and over-the-counter medicines and creams.   Previous problems you or members of your family have had with the use of anesthetics.   Any blood disorders you have had.   Other health problems you have. RISKS AND COMPLICATIONS Generally, having a facet joint block is safe. However, as with any procedure, complications can occur. Possible complications associated with having a facet joint block include:   Bleeding.   Injury to a nerve near the injection site.   Pain at the  injection site.   Weakness or numbness in areas controlled by nerves near the injection site.   Infection.   Temporary fluid retention.   Allergic reaction to anesthetics or medicines used during the procedure. BEFORE THE PROCEDURE   Follow your health care provider's instructions if you are taking dietary supplements or medicines. You may need to stop taking them or reduce your dosage.   Do not take any new dietary supplements or medicines without asking your health care provider first.   Follow your health care provider's instructions about eating and drinking before the procedure. You may need to stop eating and drinking several hours before the procedure.   Arrange to have an adult drive you home after the procedure. PROCEDURE 12. You may need to remove your clothing and dress in an open-back gown so that your health care provider can access your spine.  13. The procedure will be done while you are lying on an X-ray table. Most of the time you will be asked to lie on your stomach, but you may be asked to lie in a different position if an injection will be made in your neck.  14. Special machines will be used to monitor your oxygen levels, heart rate, and blood pressure.  15. If an injection will be made in your neck, an intravenous (IV) tube will be inserted into one of your veins. Fluids and medicine will flow directly into your body through the IV tube.  16. The area over the facet joint where the injection will be made will be cleaned with an antiseptic soap. The surrounding skin will be covered with sterile drapes.  17. An anesthetic will be applied to   your skin to make the injection area numb. You may feel a temporary stinging or burning sensation.  18. A video X-ray machine will be used to locate the joint. A contrast dye may be injected into the facet joint area to help with locating the joint.  19. When the joint is located, an anesthetic medicine will be injected  into the joint through the needle.  20. Your health care provider will ask you whether you feel pain relief. If you do feel relief, a steroid may be injected to provide pain relief for a longer period of time. If you do not feel relief or feel only partial relief, additional injections of an anesthetic may be made in other facet joints.  21. The needle will be removed, the skin will be cleansed, and bandages will be applied.  AFTER THE PROCEDURE   You will be observed for 15-30 minutes before being allowed to go home. Do not drive. Have an adult drive you or take a taxi or public transportation instead.   If you feel pain relief, the pain will return in several hours or days when the anesthetic wears off.   You may feel pain relief 2-14 days after the procedure. The amount of time this relief lasts varies from person to person.   It is normal to feel some tenderness over the injected area(s) for 2 days following the procedure.   If you have diabetes, you may have a temporary increase in blood sugar.   This information is not intended to replace advice given to you by your health care provider. Make sure you discuss any questions you have with your health care provider.   Document Released: 06/05/2006 Document Revised: 02/04/2014 Document Reviewed: 11/04/2011 Elsevier Interactive Patient Education 2016 [employer]  
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
## 2015-05-05 ENCOUNTER — Telehealth: Payer: Self-pay | Admitting: *Deleted

## 2015-05-05 NOTE — Telephone Encounter (Signed)
Spoke with patient re; procedure on yesterday, verbalizes no complications or concerns.  

## 2015-05-11 ENCOUNTER — Ambulatory Visit
Admission: RE | Admit: 2015-05-11 | Discharge: 2015-05-11 | Disposition: A | Discharge status: Home / Self Care | Payer: Medicare Other | Source: Ambulatory Visit | Attending: Family Medicine | Admitting: Family Medicine

## 2015-05-11 ENCOUNTER — Other Ambulatory Visit: Payer: Self-pay | Admitting: Family Medicine

## 2015-05-11 DIAGNOSIS — Z9882 Breast implant status: Secondary | ICD-10-CM | POA: Diagnosis not present

## 2015-05-11 DIAGNOSIS — Z1231 Encounter for screening mammogram for malignant neoplasm of breast: Secondary | ICD-10-CM

## 2015-05-22 ENCOUNTER — Encounter: Payer: Self-pay | Admitting: Pain Medicine

## 2015-05-22 ENCOUNTER — Ambulatory Visit: Payer: Medicare Other | Attending: Pain Medicine | Admitting: Pain Medicine

## 2015-05-22 VITALS — BP 118/58 | HR 66 | Temp 98.1°F | Resp 16 | Ht 61.0 in | Wt 140.0 lb

## 2015-05-22 DIAGNOSIS — G8929 Other chronic pain: Secondary | ICD-10-CM | POA: Insufficient documentation

## 2015-05-22 DIAGNOSIS — M549 Dorsalgia, unspecified: Secondary | ICD-10-CM | POA: Diagnosis present

## 2015-05-22 DIAGNOSIS — E669 Obesity, unspecified: Secondary | ICD-10-CM | POA: Diagnosis not present

## 2015-05-22 DIAGNOSIS — M545 Low back pain, unspecified: Secondary | ICD-10-CM

## 2015-05-22 DIAGNOSIS — I10 Essential (primary) hypertension: Secondary | ICD-10-CM | POA: Diagnosis not present

## 2015-05-22 DIAGNOSIS — J449 Chronic obstructive pulmonary disease, unspecified: Secondary | ICD-10-CM | POA: Insufficient documentation

## 2015-05-22 DIAGNOSIS — Z87891 Personal history of nicotine dependence: Secondary | ICD-10-CM | POA: Insufficient documentation

## 2015-05-22 DIAGNOSIS — Z5181 Encounter for therapeutic drug level monitoring: Secondary | ICD-10-CM | POA: Diagnosis not present

## 2015-05-22 DIAGNOSIS — R5382 Chronic fatigue, unspecified: Secondary | ICD-10-CM | POA: Diagnosis not present

## 2015-05-22 DIAGNOSIS — M4806 Spinal stenosis, lumbar region: Secondary | ICD-10-CM | POA: Insufficient documentation

## 2015-05-22 DIAGNOSIS — Z9689 Presence of other specified functional implants: Secondary | ICD-10-CM | POA: Insufficient documentation

## 2015-05-22 DIAGNOSIS — K219 Gastro-esophageal reflux disease without esophagitis: Secondary | ICD-10-CM | POA: Insufficient documentation

## 2015-05-22 DIAGNOSIS — M25552 Pain in left hip: Secondary | ICD-10-CM | POA: Diagnosis not present

## 2015-05-22 DIAGNOSIS — M961 Postlaminectomy syndrome, not elsewhere classified: Secondary | ICD-10-CM | POA: Diagnosis not present

## 2015-05-22 DIAGNOSIS — K449 Diaphragmatic hernia without obstruction or gangrene: Secondary | ICD-10-CM | POA: Diagnosis not present

## 2015-05-22 DIAGNOSIS — G2581 Restless legs syndrome: Secondary | ICD-10-CM | POA: Insufficient documentation

## 2015-05-22 DIAGNOSIS — M8580 Other specified disorders of bone density and structure, unspecified site: Secondary | ICD-10-CM | POA: Diagnosis not present

## 2015-05-22 DIAGNOSIS — M7918 Myalgia, other site: Secondary | ICD-10-CM

## 2015-05-22 DIAGNOSIS — J45909 Unspecified asthma, uncomplicated: Secondary | ICD-10-CM | POA: Diagnosis not present

## 2015-05-22 DIAGNOSIS — M25551 Pain in right hip: Secondary | ICD-10-CM | POA: Insufficient documentation

## 2015-05-22 DIAGNOSIS — Z201 Contact with and (suspected) exposure to tuberculosis: Secondary | ICD-10-CM | POA: Insufficient documentation

## 2015-05-22 DIAGNOSIS — M797 Fibromyalgia: Secondary | ICD-10-CM | POA: Diagnosis not present

## 2015-05-22 DIAGNOSIS — K589 Irritable bowel syndrome without diarrhea: Secondary | ICD-10-CM | POA: Insufficient documentation

## 2015-05-22 DIAGNOSIS — G4700 Insomnia, unspecified: Secondary | ICD-10-CM | POA: Insufficient documentation

## 2015-05-22 DIAGNOSIS — Z79891 Long term (current) use of opiate analgesic: Secondary | ICD-10-CM | POA: Diagnosis not present

## 2015-05-22 DIAGNOSIS — M62830 Muscle spasm of back: Secondary | ICD-10-CM | POA: Diagnosis not present

## 2015-05-22 DIAGNOSIS — M47816 Spondylosis without myelopathy or radiculopathy, lumbar region: Secondary | ICD-10-CM

## 2015-05-22 DIAGNOSIS — M791 Myalgia: Secondary | ICD-10-CM

## 2015-05-22 DIAGNOSIS — M7062 Trochanteric bursitis, left hip: Secondary | ICD-10-CM | POA: Diagnosis not present

## 2015-05-22 DIAGNOSIS — M79606 Pain in leg, unspecified: Secondary | ICD-10-CM | POA: Diagnosis present

## 2015-05-22 DIAGNOSIS — F410 Panic disorder [episodic paroxysmal anxiety] without agoraphobia: Secondary | ICD-10-CM | POA: Insufficient documentation

## 2015-05-22 DIAGNOSIS — F411 Generalized anxiety disorder: Secondary | ICD-10-CM | POA: Insufficient documentation

## 2015-05-22 DIAGNOSIS — M792 Neuralgia and neuritis, unspecified: Secondary | ICD-10-CM

## 2015-05-22 MED ORDER — OXYCODONE HCL 5 MG PO TABS: 5.0000 mg | ORAL_TABLET | Freq: Three times a day (TID) | ORAL | Status: DC | PRN

## 2015-05-22 MED ORDER — GABAPENTIN 300 MG PO CAPS: 300.0000 mg | ORAL_CAPSULE | Freq: Two times a day (BID) | ORAL | Status: DC

## 2015-05-22 MED ORDER — TIZANIDINE HCL 4 MG PO TABS: 4.0000 mg | ORAL_TABLET | Freq: Three times a day (TID) | ORAL | Status: DC | PRN

## 2015-05-22 NOTE — Progress Notes (Signed)
Oxycodone pill count # 48/90   Filled 04-10-15

## 2015-05-22 NOTE — Assessment & Plan Note (Signed)
This has been confirmed to come from the lumbar facet joints through diagnostic bilateral lumbar facet blocks.

## 2015-05-22 NOTE — Assessment & Plan Note (Signed)
That a bilateral lumbar facet syndrome has been confirmed through diagnostic lumbar facet blocks. This last one a can provided the patient with 100% relief of the pain for the duration of local anesthetics. At this point, we will go ahead and plan on doing radiofrequency ablation of the lumbar facets. We'll start with the left side first since this is the worse.

## 2015-05-22 NOTE — Progress Notes (Signed)
Safety precautions to be maintained throughout the outpatient stay will include: orient to surroundings, keep bed in low position, maintain call bell within reach at all times, provide assistance with transfer out of bed and ambulation. Pill count is oxycodone 5 mg tablets filled 04/10/2015
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
## 2015-05-22 NOTE — Patient Instructions (Signed)
GENERAL RISKS AND COMPLICATIONS  What are the risk, side effects and possible complications? Generally speaking, most procedures are safe.  However, with any procedure there are risks, side effects, and the possibility of complications.  The risks and complications are dependent upon the sites that are lesioned, or the type of nerve block to be performed.  The closer the procedure is to the spine, the more serious the risks are.  Great care is taken when placing the radio frequency needles, block needles or lesioning probes, but sometimes complications can occur. 1. Infection: Any time there is an injection through the skin, there is a risk of infection.  This is why sterile conditions are used for these blocks.  There are four possible types of infection. 1. Localized skin infection. 2. Central Nervous System Infection-This can be in the form of Meningitis, which can be deadly. 3. Epidural Infections-This can be in the form of an epidural abscess, which can cause pressure inside of the spine, causing compression of the spinal cord with subsequent paralysis. This would require an emergency surgery to decompress, and there are no guarantees that the patient would recover from the paralysis. 4. Discitis-This is an infection of the intervertebral discs.  It occurs in about 1% of discography procedures.  It is difficult to treat and it may lead to surgery.        2. Pain: the needles have to go through skin and soft tissues, will cause soreness.       3. Damage to internal structures:  The nerves to be lesioned may be near blood vessels or    other nerves which can be potentially damaged.       4. Bleeding: Bleeding is more common if the patient is taking blood thinners such as  aspirin, Coumadin, Ticiid, Plavix, etc., or if he/she have some genetic predisposition  such as hemophilia. Bleeding into the spinal canal can cause compression of the spinal  cord with subsequent paralysis.  This would require an  emergency surgery to  decompress and there are no guarantees that the patient would recover from the  paralysis.       5. Pneumothorax:  Puncturing of a lung is a possibility, every time a needle is introduced in  the area of the chest or upper back.  Pneumothorax refers to free air around the  collapsed lung(s), inside of the thoracic cavity (chest cavity).  Another two possible  complications related to a similar event would include: Hemothorax and Chylothorax.   These are variations of the Pneumothorax, where instead of air around the collapsed  lung(s), you may have blood or chyle, respectively.       6. Spinal headaches: They may occur with any procedures in the area of the spine.       7. Persistent CSF (Cerebro-Spinal Fluid) leakage: This is a rare problem, but may occur  with prolonged intrathecal or epidural catheters either due to the formation of a fistulous  track or a dural tear.       8. Nerve damage: By working so close to the spinal cord, there is always a possibility of  nerve damage, which could be as serious as a permanent spinal cord injury with  paralysis.       9. Death:  Although rare, severe deadly allergic reactions known as "Anaphylactic  reaction" can occur to any of the medications used.      10. Worsening of the symptoms:  We can always make thing worse.    What are the chances of something like this happening? Chances of any of this occuring are extremely low.  By statistics, you have more of a chance of getting killed in a motor vehicle accident: while driving to the hospital than any of the above occurring .  Nevertheless, you should be aware that they are possibilities.  In general, it is similar to taking a shower.  Everybody knows that you can slip, hit your head and get killed.  Does that mean that you should not shower again?  Nevertheless always keep in mind that statistics do not mean anything if you happen to be on the wrong side of them.  Even if a procedure has a 1  (one) in a 1,000,000 (million) chance of going wrong, it you happen to be that one..Also, keep in mind that by statistics, you have more of a chance of having something go wrong when taking medications.  Who should not have this procedure? If you are on a blood thinning medication (e.g. Coumadin, Plavix, see list of "Blood Thinners"), or if you have an active infection going on, you should not have the procedure.  If you are taking any blood thinners, please inform your physician.  How should I prepare for this procedure?  Do not eat or drink anything at least six hours prior to the procedure.  Bring a driver with you .  It cannot be a taxi.  Come accompanied by an adult that can drive you back, and that is strong enough to help you if your legs get weak or numb from the local anesthetic.  Take all of your medicines the morning of the procedure with just enough water to swallow them.  If you have diabetes, make sure that you are scheduled to have your procedure done first thing in the morning, whenever possible.  If you have diabetes, take only half of your insulin dose and notify our nurse that you have done so as soon as you arrive at the clinic.  If you are diabetic, but only take blood sugar pills (oral hypoglycemic), then do not take them on the morning of your procedure.  You may take them after you have had the procedure.  Do not take aspirin or any aspirin-containing medications, at least eleven (11) days prior to the procedure.  They may prolong bleeding.  Wear loose fitting clothing that may be easy to take off and that you would not mind if it got stained with Betadine or blood.  Do not wear any jewelry or perfume  Remove any nail coloring.  It will interfere with some of our monitoring equipment.  NOTE: Remember that this is not meant to be interpreted as a complete list of all possible complications.  Unforeseen problems may occur.  BLOOD THINNERS The following drugs  contain aspirin or other products, which can cause increased bleeding during surgery and should not be taken for 2 weeks prior to and 1 week after surgery.  If you should need take something for relief of minor pain, you may take acetaminophen which is found in Tylenol,m Datril, Anacin-3 and Panadol. It is not blood thinner. The products listed below are.  Do not take any of the products listed below in addition to any listed on your instruction sheet.  A.P.C or A.P.C with Codeine Codeine Phosphate Capsules #3 Ibuprofen Ridaura  ABC compound Congesprin Imuran rimadil  Advil Cope Indocin Robaxisal  Alka-Seltzer Effervescent Pain Reliever and Antacid Coricidin or Coricidin-D  Indomethacin Rufen    Alka-Seltzer plus Cold Medicine Cosprin Ketoprofen S-A-C Tablets  Anacin Analgesic Tablets or Capsules Coumadin Korlgesic Salflex  Anacin Extra Strength Analgesic tablets or capsules CP-2 Tablets Lanoril Salicylate  Anaprox Cuprimine Capsules Levenox Salocol  Anexsia-D Dalteparin Magan Salsalate  Anodynos Darvon compound Magnesium Salicylate Sine-off  Ansaid Dasin Capsules Magsal Sodium Salicylate  Anturane Depen Capsules Marnal Soma  APF Arthritis pain formula Dewitt's Pills Measurin Stanback  Argesic Dia-Gesic Meclofenamic Sulfinpyrazone  Arthritis Bayer Timed Release Aspirin Diclofenac Meclomen Sulindac  Arthritis pain formula Anacin Dicumarol Medipren Supac  Analgesic (Safety coated) Arthralgen Diffunasal Mefanamic Suprofen  Arthritis Strength Bufferin Dihydrocodeine Mepro Compound Suprol  Arthropan liquid Dopirydamole Methcarbomol with Aspirin Synalgos  ASA tablets/Enseals Disalcid Micrainin Tagament  Ascriptin Doan's Midol Talwin  Ascriptin A/D Dolene Mobidin Tanderil  Ascriptin Extra Strength Dolobid Moblgesic Ticlid  Ascriptin with Codeine Doloprin or Doloprin with Codeine Momentum Tolectin  Asperbuf Duoprin Mono-gesic Trendar  Aspergum Duradyne Motrin or Motrin IB Triminicin  Aspirin  plain, buffered or enteric coated Durasal Myochrisine Trigesic  Aspirin Suppositories Easprin Nalfon Trillsate  Aspirin with Codeine Ecotrin Regular or Extra Strength Naprosyn Uracel  Atromid-S Efficin Naproxen Ursinus  Auranofin Capsules Elmiron Neocylate Vanquish  Axotal Emagrin Norgesic Verin  Azathioprine Empirin or Empirin with Codeine Normiflo Vitamin E  Azolid Emprazil Nuprin Voltaren  Bayer Aspirin plain, buffered or children's or timed BC Tablets or powders Encaprin Orgaran Warfarin Sodium  Buff-a-Comp Enoxaparin Orudis Zorpin  Buff-a-Comp with Codeine Equegesic Os-Cal-Gesic   Buffaprin Excedrin plain, buffered or Extra Strength Oxalid   Bufferin Arthritis Strength Feldene Oxphenbutazone   Bufferin plain or Extra Strength Feldene Capsules Oxycodone with Aspirin   Bufferin with Codeine Fenoprofen Fenoprofen Pabalate or Pabalate-SF   Buffets II Flogesic Panagesic   Buffinol plain or Extra Strength Florinal or Florinal with Codeine Panwarfarin   Buf-Tabs Flurbiprofen Penicillamine   Butalbital Compound Four-way cold tablets Penicillin   Butazolidin Fragmin Pepto-Bismol   Carbenicillin Geminisyn Percodan   Carna Arthritis Reliever Geopen Persantine   Carprofen Gold's salt Persistin   Chloramphenicol Goody's Phenylbutazone   Chloromycetin Haltrain Piroxlcam   Clmetidine heparin Plaquenil   Cllnoril Hyco-pap Ponstel   Clofibrate Hydroxy chloroquine Propoxyphen         Before stopping any of these medications, be sure to consult the physician who ordered them.  Some, such as Coumadin (Warfarin) are ordered to prevent or treat serious conditions such as "deep thrombosis", "pumonary embolisms", and other heart problems.  The amount of time that you may need off of the medication may also vary with the medication and the reason for which you were taking it.  If you are taking any of these medications, please make sure you notify your pain physician before you undergo any  procedures.         Radiofrequency Lesioning Radiofrequency lesioning is a procedure that is performed to relieve pain. The procedure is often used for back, neck, or arm pain. Radiofrequency lesioning involves the use of a machine that creates radio waves to make heat. During the procedure, the heat is applied to the nerve that carries the pain signal. The heat damages the nerve and interferes with the pain signal. Pain relief usually lasts for 6 months to 1 year. LET YOUR HEALTH CARE PROVIDER KNOW ABOUT: 2. Any allergies you have. 3. All medicines you are taking, including vitamins, herbs, eye drops, creams, and over-the-counter medicines. 4. Previous problems you or members of your family have had with the use of anesthetics. 5. Any blood disorders you have.   6. Previous surgeries you have had. 7. Any medical conditions you have. 8. Whether you are pregnant or may be pregnant. RISKS AND COMPLICATIONS Generally, this is a safe procedure. However, problems may occur, including:  Pain or soreness at the injection site.  Infection at the injection site.  Damage to nerves or blood vessels. BEFORE THE PROCEDURE  Ask your health care provider about:  Changing or stopping your regular medicines. This is especially important if you are taking diabetes medicines or blood thinners.  Taking medicines such as aspirin and ibuprofen. These medicines can thin your blood. Do not take these medicines before your procedure if your health care provider instructs you not to.  Follow instructions from your health care provider about eating or drinking restrictions.  Plan to have someone take you home after the procedure.  If you go home right after the procedure, plan to have someone with you for 24 hours. PROCEDURE  You will be given one or more of the following:  A medicine to help you relax (sedative).  A medicine to numb the area (local anesthetic).  You will be awake during the  procedure. You will need to be able to talk with the health care provider during the procedure.  With the help of a type of X-ray (fluoroscopy), the health care provider will insert a radiofrequency needle into the area to be treated.  Next, a wire that carries the radio waves (electrode) will be put through the radiofrequency needle. An electrical pulse will be sent through the electrode to verify the correct nerve. You will feel a tingling sensation, and you may have muscle twitching.  Then, the tissue that is around the needle tip will be heated by an electric current that is passed using the radiofrequency machine. This will numb the nerves.  A bandage (dressing) will be put on the insertion area after the procedure is done. The procedure may vary among health care providers and hospitals. AFTER THE PROCEDURE 12. Your blood pressure, heart rate, breathing rate, and blood oxygen level will be monitored often until the medicines you were given have worn off. 13. Return to your normal activities as directed by your health care provider.   This information is not intended to replace advice given to you by your health care provider. Make sure you discuss any questions you have with your health care provider.   Document Released: 09/12/2010 Document Revised: 10/05/2014 Document Reviewed: 02/21/2014 [employer] Interactive Patient Education 2016 [employer]  

## 2015-05-27 LAB — TOXASSURE SELECT 13 (MW), URINE: PDF: 0

## 2015-06-28 DIAGNOSIS — Z6827 Body mass index (BMI) 27.0-27.9, adult: Secondary | ICD-10-CM | POA: Diagnosis not present

## 2015-06-28 DIAGNOSIS — F419 Anxiety disorder, unspecified: Secondary | ICD-10-CM | POA: Diagnosis not present

## 2015-06-28 DIAGNOSIS — J181 Lobar pneumonia, unspecified organism: Secondary | ICD-10-CM | POA: Diagnosis not present

## 2015-06-30 ENCOUNTER — Emergency Department: Payer: Medicare Other

## 2015-06-30 ENCOUNTER — Emergency Department
Admission: EM | Admit: 2015-06-30 | Discharge: 2015-06-30 | Disposition: A | Discharge status: Home / Self Care | Payer: Medicare Other | Attending: Emergency Medicine | Admitting: Emergency Medicine

## 2015-06-30 ENCOUNTER — Encounter: Payer: Self-pay | Admitting: Emergency Medicine

## 2015-06-30 DIAGNOSIS — Z79899 Other long term (current) drug therapy: Secondary | ICD-10-CM | POA: Diagnosis not present

## 2015-06-30 DIAGNOSIS — J45909 Unspecified asthma, uncomplicated: Secondary | ICD-10-CM | POA: Diagnosis not present

## 2015-06-30 DIAGNOSIS — E785 Hyperlipidemia, unspecified: Secondary | ICD-10-CM | POA: Diagnosis not present

## 2015-06-30 DIAGNOSIS — E669 Obesity, unspecified: Secondary | ICD-10-CM | POA: Insufficient documentation

## 2015-06-30 DIAGNOSIS — J189 Pneumonia, unspecified organism: Secondary | ICD-10-CM | POA: Insufficient documentation

## 2015-06-30 DIAGNOSIS — Z7982 Long term (current) use of aspirin: Secondary | ICD-10-CM | POA: Insufficient documentation

## 2015-06-30 DIAGNOSIS — Z87891 Personal history of nicotine dependence: Secondary | ICD-10-CM | POA: Insufficient documentation

## 2015-06-30 DIAGNOSIS — M810 Age-related osteoporosis without current pathological fracture: Secondary | ICD-10-CM | POA: Diagnosis not present

## 2015-06-30 DIAGNOSIS — R079 Chest pain, unspecified: Secondary | ICD-10-CM | POA: Diagnosis not present

## 2015-06-30 DIAGNOSIS — R0602 Shortness of breath: Secondary | ICD-10-CM | POA: Diagnosis present

## 2015-06-30 DIAGNOSIS — I10 Essential (primary) hypertension: Secondary | ICD-10-CM | POA: Diagnosis not present

## 2015-06-30 DIAGNOSIS — J449 Chronic obstructive pulmonary disease, unspecified: Secondary | ICD-10-CM | POA: Diagnosis not present

## 2015-06-30 LAB — CBC
HEMATOCRIT: 38.7 % (ref 35.0–47.0)
HEMOGLOBIN: 12.8 g/dL (ref 12.0–16.0)
MCH: 29.6 pg (ref 26.0–34.0)
MCHC: 33.1 g/dL (ref 32.0–36.0)
MCV: 89.7 fL (ref 80.0–100.0)
Platelets: 232 10*3/uL (ref 150–440)
RBC: 4.32 MIL/uL (ref 3.80–5.20)
RDW: 13.6 % (ref 11.5–14.5)
WBC: 8.1 10*3/uL (ref 3.6–11.0)

## 2015-06-30 LAB — BASIC METABOLIC PANEL
ANION GAP: 10 (ref 5–15)
BUN: 16 mg/dL (ref 6–20)
CHLORIDE: 99 mmol/L — AB (ref 101–111)
CO2: 30 mmol/L (ref 22–32)
Calcium: 9.2 mg/dL (ref 8.9–10.3)
Creatinine, Ser: 0.96 mg/dL (ref 0.44–1.00)
GFR calc Af Amer: >60 mL/min (ref >60)
GFR calc non Af Amer: 59 mL/min — ABNORMAL LOW (ref >60)
GLUCOSE: 94 mg/dL (ref 65–99)
POTASSIUM: 3.8 mmol/L (ref 3.5–5.1)
Sodium: 139 mmol/L (ref 135–145)

## 2015-06-30 LAB — TROPONIN I: Troponin I: <0.03 ng/mL (ref <0.031)
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
## 2015-06-30 NOTE — ED Notes (Signed)
Patient comes from home via [place] with her husband c/o chest pain with inspiration with weakness. Seen by PCP on Wednesday morning and placed on doxycycline 100mg  bid for 10 days without resolution of her symptoms.

## 2015-06-30 NOTE — Discharge Instructions (Signed)
We believe that your symptoms are caused today by pneumonia, an infection in your lung(s).  Please take the full course of antibiotics as prescribed and drink plenty of fluids.    Follow up with your doctor next week.  If you develop any new or worsening symptoms, including but not limited to fever in spite of taking over-the-counter ibuprofen and/or Tylenol, persistent vomiting, worsening shortness of breath, or other symptoms that concern you, please return to the Emergency Department immediately.    Pneumonia Pneumonia is an infection of the lungs.  CAUSES Pneumonia may be caused by bacteria or a virus. Usually, these infections are caused by breathing infectious particles into the lungs (respiratory tract). SIGNS AND SYMPTOMS   Cough.  Fever.  Chest pain.  Increased rate of breathing.  Wheezing.  Mucus production. DIAGNOSIS  If you have the common symptoms of pneumonia, your health care provider will typically confirm the diagnosis with a chest X-ray. The X-ray will show an abnormality in the lung (pulmonary infiltrate) if you have pneumonia. Other tests of your blood, urine, or sputum may be done to find the specific cause of your pneumonia. Your health care provider may also do tests (blood gases or pulse oximetry) to see how well your lungs are working. TREATMENT  Some forms of pneumonia may be spread to other people when you cough or sneeze. You may be asked to wear a mask before and during your exam. Pneumonia that is caused by bacteria is treated with antibiotic medicine. Pneumonia that is caused by the influenza virus may be treated with an antiviral medicine. Most other viral infections must run their course. These infections will not respond to antibiotics.  HOME CARE INSTRUCTIONS   Cough suppressants may be used if you are losing too much rest. However, coughing protects you by clearing your lungs. You should avoid using cough suppressants if you can.  Your health care  provider may have prescribed medicine if he or she thinks your pneumonia is caused by bacteria or influenza. Finish your medicine even if you start to feel better.  Your health care provider may also prescribe an expectorant. This loosens the mucus to be coughed up.  Take medicines only as directed by your health care provider.  Do not smoke. Smoking is a common cause of bronchitis and can contribute to pneumonia. If you are a smoker and continue to smoke, your cough may last several weeks after your pneumonia has cleared.  A cold steam vaporizer or humidifier in your room or home may help loosen mucus.  Coughing is often worse at night. Sleeping in a semi-upright position in a recliner or using a couple pillows under your head will help with this.  Get rest as you feel it is needed. Your body will usually let you know when you need to rest. PREVENTION A pneumococcal shot (vaccine) is available to prevent a common bacterial cause of pneumonia. This is usually suggested for:  People over 69 years old.  Patients on chemotherapy.  People with chronic lung problems, such as bronchitis or emphysema.  People with immune system problems. If you are over 65 or have a high risk condition, you may receive the pneumococcal vaccine if you have not received it before. In some countries, a routine influenza vaccine is also recommended. This vaccine can help prevent some cases of pneumonia.You may be offered the influenza vaccine as part of your care. If you smoke, it is time to quit. You may receive instructions on how  to stop smoking. Your health care provider can provide medicines and counseling to help you quit. SEEK MEDICAL CARE IF: You have a fever. SEEK IMMEDIATE MEDICAL CARE IF:   Your illness becomes worse. This is especially true if you are elderly or weakened from any other disease.  You cannot control your cough with suppressants and are losing sleep.  You begin coughing up  blood.  You develop pain which is getting worse or is uncontrolled with medicines.  Any of the symptoms which initially brought you in for treatment are getting worse rather than better.  You develop shortness of breath or chest pain. MAKE SURE YOU:   Understand these instructions.  Will watch your condition.  Will get help right away if you are not doing well or get worse. Document Released: 01/14/2005 Document Revised: 05/31/2013 Document Reviewed: 04/05/2010 [facility] Patient Information 2015 [place], [place]. This information is not intended to replace advice given to you by your health care provider. Make sure you discuss any questions you have with your health care provider.

## 2015-07-13 ENCOUNTER — Encounter: Payer: Self-pay | Admitting: Podiatry

## 2015-07-13 ENCOUNTER — Ambulatory Visit (INDEPENDENT_AMBULATORY_CARE_PROVIDER_SITE_OTHER): Payer: Medicare Other | Admitting: Podiatry

## 2015-07-13 VITALS — BP 122/63 | HR 63 | Resp 18

## 2015-07-13 DIAGNOSIS — M779 Enthesopathy, unspecified: Secondary | ICD-10-CM | POA: Diagnosis not present

## 2015-07-13 DIAGNOSIS — M2580 Other specified joint disorders, unspecified joint: Secondary | ICD-10-CM

## 2015-07-13 DIAGNOSIS — R52 Pain, unspecified: Secondary | ICD-10-CM

## 2015-07-16 NOTE — Progress Notes (Signed)
Patient ID: [name], female   DOB: 08-08-1945, 70 y.o.   MRN: [id number]  Subjective: 70 year old female presents the office today for concerns of reoccurring pain to the right foot on the same area that she had previously. The pain is on the bottom of the big toe joint for which she points to around the sesamoid area. She states that over the last couple days and started become swollen and red and very painful with pressure. After last appointment with me the pain completely resolved his been worsening over the last month or 2. No recent injury or trauma. Denies any systemic complaints such as fevers, chills, nausea, vomiting. No acute changes since last appointment, and no other complaints at this time.   Objective: AAO x3, NAD DP/PT pulses palpable bilaterally, CRT less than 3 seconds There is tenderness, right foot second metatarsal 1 on the sesamoid complex. There is no pain vibratory sensation. There is also discomfort just proximal to the sesamoids on the medial band of plantar fascia. There is no pain within the arch of the foot. No other areas of tenderness. There is no pain with MPJ range of motion. There is no edema or increase in warmth to bilateral lower extremities otherwise.  No open lesions or pre-ulcerative lesions.  No pain with calf compression, swelling, warmth, erythema  Assessment: Reoccurrence of sesamoiditis, tendinitis right foot   Plan: -All treatment options discussed with the patient including all alternatives, risks, complications.  -At this time she is requesting steroid injection. Under sterile conditions a mixture of Kenalog as well as local anesthetic was infiltrated into the area of maximal tenderness without complications. Post injection care was discussed. Offloading pads were dispensed. Ice. Anti-inflammatories. -Follow-up as scheduled or sooner if needed. -Patient encouraged to call the office with any questions, concerns, change in symptoms.    Celesta Gentile, DPM

## 2015-07-24 DIAGNOSIS — H8110 Benign paroxysmal vertigo, unspecified ear: Secondary | ICD-10-CM | POA: Diagnosis not present

## 2015-07-24 DIAGNOSIS — Z6827 Body mass index (BMI) 27.0-27.9, adult: Secondary | ICD-10-CM | POA: Diagnosis not present

## 2015-07-24 DIAGNOSIS — L282 Other prurigo: Secondary | ICD-10-CM | POA: Diagnosis not present

## 2015-07-31 ENCOUNTER — Encounter: Payer: Medicare Other | Admitting: Pain Medicine

## 2015-08-03 ENCOUNTER — Encounter: Payer: Self-pay | Admitting: Pain Medicine

## 2015-08-03 ENCOUNTER — Ambulatory Visit: Payer: Medicare Other | Attending: Pain Medicine | Admitting: Pain Medicine

## 2015-08-03 ENCOUNTER — Encounter (INDEPENDENT_AMBULATORY_CARE_PROVIDER_SITE_OTHER): Payer: Self-pay

## 2015-08-03 VITALS — BP 131/54 | HR 62 | Temp 98.0°F | Resp 18 | Ht 61.0 in | Wt 146.0 lb

## 2015-08-03 DIAGNOSIS — Z5181 Encounter for therapeutic drug level monitoring: Secondary | ICD-10-CM | POA: Diagnosis not present

## 2015-08-03 DIAGNOSIS — E669 Obesity, unspecified: Secondary | ICD-10-CM | POA: Diagnosis not present

## 2015-08-03 DIAGNOSIS — M5126 Other intervertebral disc displacement, lumbar region: Secondary | ICD-10-CM | POA: Diagnosis not present

## 2015-08-03 DIAGNOSIS — F1190 Opioid use, unspecified, uncomplicated: Secondary | ICD-10-CM | POA: Diagnosis not present

## 2015-08-03 DIAGNOSIS — Z87891 Personal history of nicotine dependence: Secondary | ICD-10-CM | POA: Insufficient documentation

## 2015-08-03 DIAGNOSIS — M79605 Pain in left leg: Secondary | ICD-10-CM

## 2015-08-03 DIAGNOSIS — F411 Generalized anxiety disorder: Secondary | ICD-10-CM | POA: Insufficient documentation

## 2015-08-03 DIAGNOSIS — M5127 Other intervertebral disc displacement, lumbosacral region: Secondary | ICD-10-CM | POA: Insufficient documentation

## 2015-08-03 DIAGNOSIS — M792 Neuralgia and neuritis, unspecified: Secondary | ICD-10-CM

## 2015-08-03 DIAGNOSIS — M791 Myalgia: Secondary | ICD-10-CM

## 2015-08-03 DIAGNOSIS — M62830 Muscle spasm of back: Secondary | ICD-10-CM | POA: Diagnosis not present

## 2015-08-03 DIAGNOSIS — M4806 Spinal stenosis, lumbar region: Secondary | ICD-10-CM | POA: Diagnosis not present

## 2015-08-03 DIAGNOSIS — M7918 Myalgia, other site: Secondary | ICD-10-CM

## 2015-08-03 DIAGNOSIS — K219 Gastro-esophageal reflux disease without esophagitis: Secondary | ICD-10-CM | POA: Insufficient documentation

## 2015-08-03 DIAGNOSIS — G8929 Other chronic pain: Secondary | ICD-10-CM | POA: Diagnosis not present

## 2015-08-03 DIAGNOSIS — M48061 Spinal stenosis, lumbar region without neurogenic claudication: Secondary | ICD-10-CM

## 2015-08-03 DIAGNOSIS — M533 Sacrococcygeal disorders, not elsewhere classified: Secondary | ICD-10-CM | POA: Diagnosis not present

## 2015-08-03 DIAGNOSIS — M8580 Other specified disorders of bone density and structure, unspecified site: Secondary | ICD-10-CM | POA: Diagnosis not present

## 2015-08-03 DIAGNOSIS — J449 Chronic obstructive pulmonary disease, unspecified: Secondary | ICD-10-CM | POA: Diagnosis not present

## 2015-08-03 DIAGNOSIS — M961 Postlaminectomy syndrome, not elsewhere classified: Secondary | ICD-10-CM | POA: Insufficient documentation

## 2015-08-03 DIAGNOSIS — I700 Atherosclerosis of aorta: Secondary | ICD-10-CM | POA: Diagnosis not present

## 2015-08-03 DIAGNOSIS — M545 Low back pain, unspecified: Secondary | ICD-10-CM

## 2015-08-03 DIAGNOSIS — K589 Irritable bowel syndrome without diarrhea: Secondary | ICD-10-CM | POA: Diagnosis not present

## 2015-08-03 DIAGNOSIS — M5416 Radiculopathy, lumbar region: Secondary | ICD-10-CM

## 2015-08-03 DIAGNOSIS — K449 Diaphragmatic hernia without obstruction or gangrene: Secondary | ICD-10-CM | POA: Diagnosis not present

## 2015-08-03 DIAGNOSIS — M797 Fibromyalgia: Secondary | ICD-10-CM

## 2015-08-03 DIAGNOSIS — Z7982 Long term (current) use of aspirin: Secondary | ICD-10-CM | POA: Insufficient documentation

## 2015-08-03 DIAGNOSIS — M25559 Pain in unspecified hip: Secondary | ICD-10-CM | POA: Insufficient documentation

## 2015-08-03 DIAGNOSIS — Z79891 Long term (current) use of opiate analgesic: Secondary | ICD-10-CM | POA: Diagnosis not present

## 2015-08-03 DIAGNOSIS — G2581 Restless legs syndrome: Secondary | ICD-10-CM | POA: Insufficient documentation

## 2015-08-03 DIAGNOSIS — M47896 Other spondylosis, lumbar region: Secondary | ICD-10-CM | POA: Diagnosis not present

## 2015-08-03 DIAGNOSIS — I10 Essential (primary) hypertension: Secondary | ICD-10-CM | POA: Diagnosis not present

## 2015-08-03 DIAGNOSIS — M549 Dorsalgia, unspecified: Secondary | ICD-10-CM | POA: Diagnosis present

## 2015-08-03 DIAGNOSIS — M47816 Spondylosis without myelopathy or radiculopathy, lumbar region: Secondary | ICD-10-CM

## 2015-08-03 MED ORDER — OXYCODONE HCL 5 MG PO TABS: 5.0000 mg | ORAL_TABLET | Freq: Three times a day (TID) | ORAL | Status: DC | PRN

## 2015-08-03 MED ORDER — TIZANIDINE HCL 4 MG PO TABS: 4.0000 mg | ORAL_TABLET | Freq: Three times a day (TID) | ORAL | Status: DC | PRN

## 2015-08-03 MED ORDER — GABAPENTIN 300 MG PO CAPS: 300.0000 mg | ORAL_CAPSULE | Freq: Two times a day (BID) | ORAL | Status: DC

## 2015-08-03 NOTE — Progress Notes (Signed)
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 Transforaminal epidural without steroid   Chronic sacroiliac joint pain (Left) (Chronic)   Relevant Medications   tiZANidine (  ZANAFLEX) 4 MG tablet   oxyCODONE (OXY IR/ROXICODONE) 5 MG immediate release tablet   oxyCODONE (OXY IR/ROXICODONE) 5 MG immediate release tablet   oxyCODONE (OXY IR/ROXICODONE) 5 MG immediate release tablet   Other Relevant Orders   SACROILIAC JOINT INJECTINS   Fibromyalgia (Chronic)   Relevant Medications   gabapentin (NEURONTIN) 300 MG capsule   Lower extremity pain (Left) (Chronic)   Relevant Orders   LUMBAR EPIDURAL STEROID INJECTION   Lumbar Transforaminal epidural without steroid   Lumbar facet syndrome (Location of Primary Source of Pain) (Bilateral) (L>R) (Chronic)   Relevant Medications   tiZANidine (ZANAFLEX) 4 MG tablet   oxyCODONE (OXY IR/ROXICODONE) 5 MG immediate release tablet   oxyCODONE (OXY IR/ROXICODONE) 5 MG immediate release tablet   oxyCODONE (OXY IR/ROXICODONE) 5 MG  immediate release tablet   Other Relevant Orders   Radiofrequency,Lumbar   Lumbar foraminal stenosis (bilateral L4-5) (Chronic)   Relevant Orders   Lumbar Transforaminal epidural without steroid   Lumbar spinal stenosis (L4-5 and L1-2) (Chronic)   Relevant Orders   LUMBAR EPIDURAL STEROID INJECTION   Lumbar spondylosis (Chronic)   Relevant Medications   tiZANidine (ZANAFLEX) 4 MG tablet   oxyCODONE (OXY IR/ROXICODONE) 5 MG immediate release tablet   oxyCODONE (OXY IR/ROXICODONE) 5 MG immediate release tablet   oxyCODONE (OXY IR/ROXICODONE) 5 MG immediate release tablet   Other Relevant Orders   Radiofrequency,Lumbar   Muscle spasm of back (Chronic)   Relevant Medications   tiZANidine (ZANAFLEX) 4 MG tablet   Musculoskeletal pain (Chronic)   Relevant Medications   tiZANidine (ZANAFLEX) 4 MG tablet   Neurogenic pain (Chronic)   Relevant Medications   gabapentin (NEURONTIN) 300 MG capsule     Medium   Encounter for therapeutic drug level monitoring   Long term current use of opiate analgesic (Chronic)   Opiate use (22.5 MME/day) (Chronic)       Pharmacotherapy (Medications Ordered): Meds ordered this encounter  Medications  . gabapentin (NEURONTIN) 300 MG capsule    Sig: Take 1 capsule (300 mg total) by mouth 2 (two) times daily.    Dispense:  60 capsule    Refill:  2    Do not place this medication, or any other prescription from our practice, on "Automatic Refill". Patient may have prescription filled one day early if pharmacy is closed on scheduled refill date.  Marland Kitchen tiZANidine (ZANAFLEX) 4 MG tablet    Sig: Take 1 tablet (4 mg total) by mouth every 8 (eight) hours as needed for muscle spasms.    Dispense:  90 tablet    Refill:  2  . oxyCODONE (OXY IR/ROXICODONE) 5 MG immediate release tablet    Sig: Take 1 tablet (5 mg total) by mouth every 8 (eight) hours as needed for severe pain.    Dispense:  90 tablet    Refill:  0    Do not place this medication, or any other  prescription from our practice, on "Automatic Refill". Patient may have prescription filled one day early if pharmacy is closed on scheduled refill date. Do not fill until: 08/27/15 To last until: 09/26/15  . oxyCODONE (OXY IR/ROXICODONE) 5 MG immediate release tablet    Sig: Take 1 tablet (5 mg total) by mouth every 8 (eight) hours as needed for severe pain.    Dispense:  90 tablet    Refill:  0    Do not place this medication, or any other prescription from our practice, on "Automatic Refill". Patient may have prescription filled  one day early if pharmacy is closed on scheduled refill date. Do not fill until: 09/26/15 To last until: 10/26/15  . oxyCODONE (OXY IR/ROXICODONE) 5 MG immediate release tablet    Sig: Take 1 tablet (5 mg total) by mouth every 8 (eight) hours as needed for severe pain.    Dispense:  90 tablet    Refill:  0    Do not place this medication, or any other prescription from our practice, on "Automatic Refill". Patient may have prescription filled one day early if pharmacy is closed on scheduled refill date. Do not fill until: 10/26/15 To last until: 11/25/15    [facility] & Procedure Ordered: Orders Placed This Encounter  Procedures  . HIP INJECTION  . Radiofrequency,Lumbar  . SACROILIAC JOINT INJECTINS  . LUMBAR EPIDURAL STEROID INJECTION  . Lumbar Transforaminal epidural without steroid    Imaging Ordered: None  Interventional Therapies: Scheduled: None at this point.   Considering:  1. Diagnostic bilateral intra-articular hip injection. 2. Bilateral lumbar facet radiofrequency ablation.  3. Palliative bilateral lumbar facet block. 4. Diagnostic left sacroiliac joint block. 5. Palliative L1-2 vs L4-4 LESI. 6. Palliative bilateral L4-5 TFESI.   PRN Procedures:  1. Diagnostic bilateral intra-articular hip injection. 2. Bilateral lumbar facet radiofrequency ablation.  3. Palliative bilateral lumbar facet block. 4. Diagnostic left sacroiliac  joint block. 5. Palliative L1-2 vs L4-4 LESI. 6. Palliative bilateral L4-5 TFESI.       Referral(s) or Consult(s): None at this time.  New Prescriptions   No medications on file    Medications administered during this visit: Ms. Stakes had no medications administered during this visit.  Requested PM Follow-up: Return in 3 months (on 11/01/2015) for (3-Mo), Med-Mgmt, (PRN) Procedure.  Future Appointments Date Time Provider [place]  08/10/2015 9:00 AM Trula Slade, DPM [facility]  11/01/2015 8:40 AM Milinda Pointer, MD [facility] None    Primary Care Physician: Leonides Sake, MD Location: [facility] Note by: Kathlen Brunswick. Dossie Arbour, M.D, DABA, DABAPM, DABPM, DABIPP, FIPP  Pain Score Disclaimer: We use the NRS-11 scale. This is a self-reported, subjective measurement of pain severity with only modest accuracy. It is used primarily to identify changes within a particular patient. It must be understood that outpatient pain scales are significantly less accurate that those used for research, where they can be applied under ideal controlled circumstances with minimal exposure to variables. In reality, the score is likely to be a combination of pain intensity and pain affect, where pain affect describes the degree of emotional arousal or changes in action readiness caused by the sensory experience of pain. Factors such as social and work situation, setting, emotional state, anxiety levels, expectation, and prior pain experience may influence pain perception and show large inter-individual differences that may also be affected by time variables.  Patient instructions provided during this appointment: There are no Patient Instructions on file for this visit.

## 2015-08-03 NOTE — Progress Notes (Signed)
Safety precautions to be maintained throughout the outpatient stay will include: orient to surroundings, keep bed in low position, maintain call bell within reach at all times, provide assistance with transfer out of bed and ambulation.  Oxycodone 5 mg #13 out of 90 remaining. Filled 07-08-15. States has 10-12 Oxycodone in bathroom at home.

## 2015-08-07 DIAGNOSIS — J4520 Mild intermittent asthma, uncomplicated: Secondary | ICD-10-CM | POA: Diagnosis not present

## 2015-08-07 DIAGNOSIS — R918 Other nonspecific abnormal finding of lung field: Secondary | ICD-10-CM | POA: Diagnosis not present

## 2015-08-10 ENCOUNTER — Ambulatory Visit: Payer: Medicare Other | Admitting: Podiatry

## 2015-08-15 ENCOUNTER — Encounter: Payer: Self-pay | Admitting: Podiatry

## 2015-08-15 ENCOUNTER — Ambulatory Visit (INDEPENDENT_AMBULATORY_CARE_PROVIDER_SITE_OTHER): Payer: Medicare Other | Admitting: Podiatry

## 2015-08-15 DIAGNOSIS — M205X1 Other deformities of toe(s) (acquired), right foot: Secondary | ICD-10-CM

## 2015-08-15 DIAGNOSIS — M2041 Other hammer toe(s) (acquired), right foot: Secondary | ICD-10-CM | POA: Diagnosis not present

## 2015-08-15 DIAGNOSIS — L988 Other specified disorders of the skin and subcutaneous tissue: Secondary | ICD-10-CM

## 2015-08-15 DIAGNOSIS — M2580 Other specified joint disorders, unspecified joint: Secondary | ICD-10-CM | POA: Diagnosis not present

## 2015-08-15 DIAGNOSIS — R238 Other skin changes: Secondary | ICD-10-CM

## 2015-08-15 DIAGNOSIS — M779 Enthesopathy, unspecified: Secondary | ICD-10-CM | POA: Diagnosis not present

## 2015-08-24 NOTE — Progress Notes (Signed)
Patient ID: [name], female   DOB: 01-19-1946, 70 y.o.   MRN: [id number]  Subjective: 70 year old female presents the office today for follow-up evaluation of right foot sesamoiditis. She feels that the pain has improved although it does continue somewhat. The pain has moved somewhat and she points just proximal to the area of the sesamoids. She also states that she has a blister on her right fifth toe and third toe has a bump on it as well. She feels this is new. No other concerns this time in no acute changes otherwise.   Objective: AAO x3, NAD DP/PT pulses palpable bilaterally, CRT less than 3 seconds There is tenderness along the medial band of the plantar fascia proximal sesamoids. Tendon appears to be intact. No area pinpoint bony tenderness and vibratory sensation. His atrophy of the fat pad within the submetatarsal area. Dried bulla on the right dorsal fifth toe which is starting to callus over. There is no drainage. No surrounding erythema, ascending saline disc, flexors, crepitus, malodor. 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 No other areas of tenderness bilaterally. No pain with calf compression, swelling, warmth, erythema  Assessment: Somewhat as/tendinitis right foot, resolving bulla fifth toe, mallet toe right third toe  Plan: -All treatment options discussed with the patient including all alternatives, risks, complications.  -At this time her pain has migrated somewhat approximate. Discuss repeat steroid injection to this area as her symptoms are improving. I understand risks and complications of the repeat steroid injection she wishes to proceed. Under sterile conditions a small amount of Kenalog as well as local anesthetic was infiltrated into the area of maximal tenderness without couple complications. Post injection care was discussed. Continue offloading pads. Discussed shoe gear modifications. -Continue Neosporin along the bulla. Monitor for  infection. -Offloading pads for mallet toe. -Follow-up as scheduled or sooner if needed. Call if questions concerns meantime.  Celesta Gentile, DPM

## 2015-09-26 ENCOUNTER — Ambulatory Visit: Payer: Medicare Other | Admitting: Podiatry

## 2015-09-29 DIAGNOSIS — N390 Urinary tract infection, site not specified: Secondary | ICD-10-CM | POA: Diagnosis not present

## 2015-09-29 DIAGNOSIS — Z6826 Body mass index (BMI) 26.0-26.9, adult: Secondary | ICD-10-CM | POA: Diagnosis not present

## 2015-10-24 DIAGNOSIS — I10 Essential (primary) hypertension: Secondary | ICD-10-CM | POA: Diagnosis not present

## 2015-10-24 DIAGNOSIS — G2581 Restless legs syndrome: Secondary | ICD-10-CM | POA: Diagnosis not present

## 2015-10-24 DIAGNOSIS — Z23 Encounter for immunization: Secondary | ICD-10-CM | POA: Diagnosis not present

## 2015-10-24 DIAGNOSIS — E782 Mixed hyperlipidemia: Secondary | ICD-10-CM | POA: Diagnosis not present

## 2015-10-24 DIAGNOSIS — H8110 Benign paroxysmal vertigo, unspecified ear: Secondary | ICD-10-CM | POA: Diagnosis not present

## 2015-10-24 DIAGNOSIS — G4700 Insomnia, unspecified: Secondary | ICD-10-CM | POA: Diagnosis not present

## 2015-10-24 DIAGNOSIS — F418 Other specified anxiety disorders: Secondary | ICD-10-CM | POA: Diagnosis not present

## 2015-10-24 DIAGNOSIS — E0590 Thyrotoxicosis, unspecified without thyrotoxic crisis or storm: Secondary | ICD-10-CM | POA: Diagnosis not present

## 2015-10-24 DIAGNOSIS — Z6826 Body mass index (BMI) 26.0-26.9, adult: Secondary | ICD-10-CM | POA: Diagnosis not present

## 2015-10-24 DIAGNOSIS — R609 Edema, unspecified: Secondary | ICD-10-CM | POA: Diagnosis not present

## 2015-11-01 ENCOUNTER — Encounter: Payer: Self-pay | Admitting: Pain Medicine

## 2015-11-01 ENCOUNTER — Ambulatory Visit: Payer: Medicare Other | Attending: Pain Medicine | Admitting: Pain Medicine

## 2015-11-01 VITALS — BP 107/92 | HR 66 | Temp 98.0°F | Resp 18 | Ht 61.0 in | Wt 140.0 lb

## 2015-11-01 DIAGNOSIS — Z7951 Long term (current) use of inhaled steroids: Secondary | ICD-10-CM | POA: Diagnosis not present

## 2015-11-01 DIAGNOSIS — Z888 Allergy status to other drugs, medicaments and biological substances status: Secondary | ICD-10-CM | POA: Diagnosis not present

## 2015-11-01 DIAGNOSIS — R5382 Chronic fatigue, unspecified: Secondary | ICD-10-CM | POA: Diagnosis not present

## 2015-11-01 DIAGNOSIS — Z8249 Family history of ischemic heart disease and other diseases of the circulatory system: Secondary | ICD-10-CM | POA: Diagnosis not present

## 2015-11-01 DIAGNOSIS — Z7982 Long term (current) use of aspirin: Secondary | ICD-10-CM | POA: Diagnosis not present

## 2015-11-01 DIAGNOSIS — Z87891 Personal history of nicotine dependence: Secondary | ICD-10-CM | POA: Diagnosis not present

## 2015-11-01 DIAGNOSIS — G894 Chronic pain syndrome: Secondary | ICD-10-CM | POA: Diagnosis not present

## 2015-11-01 DIAGNOSIS — I10 Essential (primary) hypertension: Secondary | ICD-10-CM | POA: Insufficient documentation

## 2015-11-01 DIAGNOSIS — M1288 Other specific arthropathies, not elsewhere classified, other specified site: Secondary | ICD-10-CM | POA: Diagnosis not present

## 2015-11-01 DIAGNOSIS — G2581 Restless legs syndrome: Secondary | ICD-10-CM | POA: Diagnosis not present

## 2015-11-01 DIAGNOSIS — Z79891 Long term (current) use of opiate analgesic: Secondary | ICD-10-CM | POA: Diagnosis not present

## 2015-11-01 DIAGNOSIS — Z885 Allergy status to narcotic agent status: Secondary | ICD-10-CM | POA: Diagnosis not present

## 2015-11-01 DIAGNOSIS — M810 Age-related osteoporosis without current pathological fracture: Secondary | ICD-10-CM | POA: Insufficient documentation

## 2015-11-01 DIAGNOSIS — Z79899 Other long term (current) drug therapy: Secondary | ICD-10-CM | POA: Diagnosis not present

## 2015-11-01 DIAGNOSIS — M47816 Spondylosis without myelopathy or radiculopathy, lumbar region: Secondary | ICD-10-CM | POA: Insufficient documentation

## 2015-11-01 DIAGNOSIS — Z880 Allergy status to penicillin: Secondary | ICD-10-CM | POA: Diagnosis not present

## 2015-11-01 DIAGNOSIS — G8929 Other chronic pain: Secondary | ICD-10-CM | POA: Insufficient documentation

## 2015-11-01 DIAGNOSIS — G4700 Insomnia, unspecified: Secondary | ICD-10-CM | POA: Insufficient documentation

## 2015-11-01 DIAGNOSIS — F1190 Opioid use, unspecified, uncomplicated: Secondary | ICD-10-CM

## 2015-11-01 DIAGNOSIS — M62830 Muscle spasm of back: Secondary | ICD-10-CM | POA: Insufficient documentation

## 2015-11-01 DIAGNOSIS — F411 Generalized anxiety disorder: Secondary | ICD-10-CM | POA: Insufficient documentation

## 2015-11-01 DIAGNOSIS — M961 Postlaminectomy syndrome, not elsewhere classified: Secondary | ICD-10-CM | POA: Diagnosis not present

## 2015-11-01 DIAGNOSIS — K589 Irritable bowel syndrome without diarrhea: Secondary | ICD-10-CM | POA: Insufficient documentation

## 2015-11-01 DIAGNOSIS — M7918 Myalgia, other site: Secondary | ICD-10-CM

## 2015-11-01 DIAGNOSIS — J449 Chronic obstructive pulmonary disease, unspecified: Secondary | ICD-10-CM | POA: Diagnosis not present

## 2015-11-01 DIAGNOSIS — M792 Neuralgia and neuritis, unspecified: Secondary | ICD-10-CM

## 2015-11-01 DIAGNOSIS — E785 Hyperlipidemia, unspecified: Secondary | ICD-10-CM | POA: Insufficient documentation

## 2015-11-01 DIAGNOSIS — Z7983 Long term (current) use of bisphosphonates: Secondary | ICD-10-CM | POA: Insufficient documentation

## 2015-11-01 DIAGNOSIS — M545 Low back pain, unspecified: Secondary | ICD-10-CM

## 2015-11-01 DIAGNOSIS — K219 Gastro-esophageal reflux disease without esophagitis: Secondary | ICD-10-CM | POA: Insufficient documentation

## 2015-11-01 DIAGNOSIS — M797 Fibromyalgia: Secondary | ICD-10-CM | POA: Diagnosis not present

## 2015-11-01 DIAGNOSIS — M791 Myalgia: Secondary | ICD-10-CM

## 2015-11-01 MED ORDER — OXYCODONE HCL 5 MG PO TABS: 5.0000 mg | ORAL_TABLET | Freq: Three times a day (TID) | ORAL | 0 refills | Status: DC | PRN

## 2015-11-01 MED ORDER — TIZANIDINE HCL 4 MG PO TABS: 4.0000 mg | ORAL_TABLET | Freq: Three times a day (TID) | ORAL | 2 refills | Status: DC | PRN

## 2015-11-01 MED ORDER — GABAPENTIN 300 MG PO CAPS: 300.0000 mg | ORAL_CAPSULE | Freq: Two times a day (BID) | ORAL | 2 refills | Status: DC

## 2015-11-01 NOTE — Progress Notes (Signed)
Safety precautions to be maintained throughout the outpatient stay will include: orient to surroundings, keep bed in low position, maintain call bell within reach at all times, provide assistance with transfer out of bed and ambulation.  Pills remaining oxycodone 5mg  10/90 remaining filled 10/05/15  Medication refill today
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
## 2015-11-01 NOTE — Patient Instructions (Signed)
Radiofrequency Lesioning Radiofrequency lesioning is a procedure that is performed to relieve pain. The procedure is often used for back, neck, or arm pain. Radiofrequency lesioning involves the use of a machine that creates radio waves to make heat. During the procedure, the heat is applied to the nerve that carries the pain signal. The heat damages the nerve and interferes with the pain signal. Pain relief usually lasts for 6 months to 1 year. LET YOUR HEALTH CARE PROVIDER KNOW ABOUT:  Any allergies you have.  All medicines you are taking, including vitamins, herbs, eye drops, creams, and over-the-counter medicines.  Previous problems you or members of your family have had with the use of anesthetics.  Any blood disorders you have.  Previous surgeries you have had.  Any medical conditions you have.  Whether you are pregnant or may be pregnant. RISKS AND COMPLICATIONS Generally, this is a safe procedure. However, problems may occur, including:  Pain or soreness at the injection site.  Infection at the injection site.  Damage to nerves or blood vessels. BEFORE THE PROCEDURE  Ask your health care provider about:  Changing or stopping your regular medicines. This is especially important if you are taking diabetes medicines or blood thinners.  Taking medicines such as aspirin and ibuprofen. These medicines can thin your blood. Do not take these medicines before your procedure if your health care provider instructs you not to.  Follow instructions from your health care provider about eating or drinking restrictions.  Plan to have someone take you home after the procedure.  If you go home right after the procedure, plan to have someone with you for 24 hours. PROCEDURE  You will be given one or more of the following:  A medicine to help you relax (sedative).  A medicine to numb the area (local anesthetic).  You will be awake during the procedure. You will need to be able to  talk with the health care provider during the procedure.  With the help of a type of X-ray (fluoroscopy), the health care provider will insert a radiofrequency needle into the area to be treated.  Next, a wire that carries the radio waves (electrode) will be put through the radiofrequency needle. An electrical pulse will be sent through the electrode to verify the correct nerve. You will feel a tingling sensation, and you may have muscle twitching.  Then, the tissue that is around the needle tip will be heated by an electric current that is passed using the radiofrequency machine. This will numb the nerves.  A bandage (dressing) will be put on the insertion area after the procedure is done. The procedure may vary among health care providers and hospitals. AFTER THE PROCEDURE  Your blood pressure, heart rate, breathing rate, and blood oxygen level will be monitored often until the medicines you were given have worn off.  Return to your normal activities as directed by your health care provider.   This information is not intended to replace advice given to you by your health care provider. Make sure you discuss any questions you have with your health care provider.   Document Released: 09/12/2010 Document Revised: 10/05/2014 Document Reviewed: 02/21/2014 Elsevier Interactive Patient Education 2016 [employer]  

## 2015-11-14 ENCOUNTER — Telehealth: Payer: Self-pay

## 2015-11-14 NOTE — Telephone Encounter (Signed)
Patient wants to speak to Angie regarding her RF pre auth

## 2015-11-16 ENCOUNTER — Ambulatory Visit: Payer: Medicare Other | Admitting: Pain Medicine

## 2015-11-23 ENCOUNTER — Ambulatory Visit
Admission: RE | Admit: 2015-11-23 | Discharge: 2015-11-23 | Disposition: A | Discharge status: Home / Self Care | Payer: Medicare Other | Source: Ambulatory Visit | Attending: Pain Medicine | Admitting: Pain Medicine

## 2015-11-23 ENCOUNTER — Encounter: Payer: Self-pay | Admitting: Pain Medicine

## 2015-11-23 ENCOUNTER — Ambulatory Visit (HOSPITAL_BASED_OUTPATIENT_CLINIC_OR_DEPARTMENT_OTHER): Payer: Medicare Other | Admitting: Pain Medicine

## 2015-11-23 VITALS — BP 161/75 | HR 62 | Temp 98.0°F | Resp 16 | Ht 61.0 in | Wt 137.0 lb

## 2015-11-23 DIAGNOSIS — G8929 Other chronic pain: Secondary | ICD-10-CM

## 2015-11-23 DIAGNOSIS — M47816 Spondylosis without myelopathy or radiculopathy, lumbar region: Secondary | ICD-10-CM

## 2015-11-23 DIAGNOSIS — G8918 Other acute postprocedural pain: Secondary | ICD-10-CM

## 2015-11-23 DIAGNOSIS — M1288 Other specific arthropathies, not elsewhere classified, other specified site: Secondary | ICD-10-CM | POA: Diagnosis not present

## 2015-11-23 DIAGNOSIS — M545 Low back pain, unspecified: Secondary | ICD-10-CM

## 2015-11-23 HISTORY — DX: Other acute postprocedural pain: G89.18

## 2015-11-23 MED ORDER — LIDOCAINE HCL (PF) 1 % IJ SOLN: 10.0000 mL | Freq: Once | INTRAMUSCULAR | Status: DC

## 2015-11-23 MED ORDER — DIPHENHYDRAMINE HCL 50 MG/ML IJ SOLN
INTRAMUSCULAR | Status: AC
  Filled 2015-11-23: qty 1

## 2015-11-23 MED ORDER — TRIAMCINOLONE ACETONIDE 40 MG/ML IJ SUSP: 40.0000 mg | Freq: Once | INTRAMUSCULAR | Status: DC

## 2015-11-23 MED ORDER — ROPIVACAINE HCL 2 MG/ML IJ SOLN: 9.0000 mL | Freq: Once | INTRAMUSCULAR | Status: DC

## 2015-11-23 MED ORDER — OXYCODONE HCL 5 MG PO TABS: 5.0000 mg | ORAL_TABLET | Freq: Four times a day (QID) | ORAL | 0 refills | Status: DC | PRN

## 2015-11-23 MED ORDER — LIDOCAINE HCL (PF) 1 % IJ SOLN
INTRAMUSCULAR | Status: AC
  Administered 2015-11-23: 13:00:00
  Filled 2015-11-23: qty 5

## 2015-11-23 MED ORDER — MIDAZOLAM HCL 5 MG/5ML IJ SOLN
INTRAMUSCULAR | Status: AC
  Filled 2015-11-23: qty 5

## 2015-11-23 MED ORDER — LACTATED RINGERS IV SOLN: 1000.0000 mL | Freq: Once | INTRAVENOUS | Status: DC

## 2015-11-23 MED ORDER — DIPHENHYDRAMINE HCL 50 MG/ML IJ SOLN
12.5000 mg | INTRAMUSCULAR | Status: DC | PRN
  Administered 2015-11-23: 12.5 mg via INTRAVENOUS

## 2015-11-23 MED ORDER — ROPIVACAINE HCL 2 MG/ML IJ SOLN
INTRAMUSCULAR | Status: AC
  Administered 2015-11-23: 13:00:00
  Filled 2015-11-23: qty 10

## 2015-11-23 MED ORDER — TRIAMCINOLONE ACETONIDE 40 MG/ML IJ SUSP
INTRAMUSCULAR | Status: AC
  Administered 2015-11-23: 13:00:00
  Filled 2015-11-23: qty 1

## 2015-11-23 MED ORDER — FENTANYL CITRATE (PF) 100 MCG/2ML IJ SOLN
INTRAMUSCULAR | Status: AC
  Administered 2015-11-23: 100 ug
  Filled 2015-11-23: qty 2

## 2015-11-23 NOTE — Patient Instructions (Signed)
Facet Joint Block The facet joints connect the bones of the spine (vertebrae). They make it possible for you to bend, twist, and make other movements with your spine. They also prevent you from overbending, overtwisting, and making other excessive movements.  A facet joint block is a procedure where a numbing medicine (anesthetic) is injected into a facet joint. Often, a type of anti-inflammatory medicine called a steroid is also injected. A facet joint block may be done for two reasons:   Diagnosis. A facet joint block may be done as a test to see whether neck or back pain is caused by a worn-down or infected facet joint. If the pain gets better after a facet joint block, it means the pain is probably coming from the facet joint. If the pain does not get better, it means the pain is probably not coming from the facet joint.   Therapy. A facet joint block may be done to relieve neck or back pain caused by a facet joint. A facet joint block is only done as a therapy if the pain does not improve with medicine, exercise programs, physical therapy, and other forms of pain management. LET YOUR HEALTH CARE PROVIDER KNOW ABOUT:   Any allergies you have.   All medicines you are taking, including vitamins, herbs, eyedrops, and over-the-counter medicines and creams.   Previous problems you or members of your family have had with the use of anesthetics.   Any blood disorders you have had.   Other health problems you have. RISKS AND COMPLICATIONS Generally, having a facet joint block is safe. However, as with any procedure, complications can occur. Possible complications associated with having a facet joint block include:   Bleeding.   Injury to a nerve near the injection site.   Pain at the injection site.   Weakness or numbness in areas controlled by nerves near the injection site.   Infection.   Temporary fluid retention.   Allergic reaction to anesthetics or medicines used during  the procedure. BEFORE THE PROCEDURE   Follow your health care provider's instructions if you are taking dietary supplements or medicines. You may need to stop taking them or reduce your dosage.   Do not take any new dietary supplements or medicines without asking your health care provider first.   Follow your health care provider's instructions about eating and drinking before the procedure. You may need to stop eating and drinking several hours before the procedure.   Arrange to have an adult drive you home after the procedure. PROCEDURE  You may need to remove your clothing and dress in an open-back gown so that your health care provider can access your spine.   The procedure will be done while you are lying on an X-ray table. Most of the time you will be asked to lie on your stomach, but you may be asked to lie in a different position if an injection will be made in your neck.   Special machines will be used to monitor your oxygen levels, heart rate, and blood pressure.   If an injection will be made in your neck, an intravenous (IV) tube will be inserted into one of your veins. Fluids and medicine will flow directly into your body through the IV tube.   The area over the facet joint where the injection will be made will be cleaned with an antiseptic soap. The surrounding skin will be covered with sterile drapes.   An anesthetic will be applied to your skin   to make the injection area numb. You may feel a temporary stinging or burning sensation.   A video X-ray machine will be used to locate the joint. A contrast dye may be injected into the facet joint area to help with locating the joint.   When the joint is located, an anesthetic medicine will be injected into the joint through the needle.   Your health care provider will ask you whether you feel pain relief. If you do feel relief, a steroid may be injected to provide pain relief for a longer period of time. If you do not  feel relief or feel only partial relief, additional injections of an anesthetic may be made in other facet joints.   The needle will be removed, the skin will be cleansed, and bandages will be applied.  AFTER THE PROCEDURE   You will be observed for 15-30 minutes before being allowed to go home. Do not drive. Have an adult drive you or take a taxi or public transportation instead.   If you feel pain relief, the pain will return in several hours or days when the anesthetic wears off.   You may feel pain relief 2-14 days after the procedure. The amount of time this relief lasts varies from person to person.   It is normal to feel some tenderness over the injected area(s) for 2 days following the procedure.   If you have diabetes, you may have a temporary increase in blood sugar.   This information is not intended to replace advice given to you by your health care provider. Make sure you discuss any questions you have with your health care provider.   Document Released: 06/05/2006 Document Revised: 02/04/2014 Document Reviewed: 11/04/2011 Elsevier Interactive Patient Education 2016 [employer]. Radiofrequency Lesioning Radiofrequency lesioning is a procedure that is performed to relieve pain. The procedure is often used for back, neck, or arm pain. Radiofrequency lesioning involves the use of a machine that creates radio waves to make heat. During the procedure, the heat is applied to the nerve that carries the pain signal. The heat damages the nerve and interferes with the pain signal. Pain relief usually lasts for 6 months to 1 year. LET [facility] CARE PROVIDER KNOW ABOUT:  Any allergies you have.  All medicines you are taking, including vitamins, herbs, eye drops, creams, and over-the-counter medicines.  Previous problems you or members of your family have had with the use of anesthetics.  Any blood disorders you have.  Previous surgeries you have had.  Any medical  conditions you have.  Whether you are pregnant or may be pregnant. RISKS AND COMPLICATIONS Generally, this is a safe procedure. However, problems may occur, including:  Pain or soreness at the injection site.  Infection at the injection site.  Damage to nerves or blood vessels. BEFORE THE PROCEDURE  Ask your health care provider about:  Changing or stopping your regular medicines. This is especially important if you are taking diabetes medicines or blood thinners.  Taking medicines such as aspirin and ibuprofen. These medicines can thin your blood. Do not take these medicines before your procedure if your health care provider instructs you not to.  Follow instructions from your health care provider about eating or drinking restrictions.  Plan to have someone take you home after the procedure.  If you go home right after the procedure, plan to have someone with you for 24 hours. PROCEDURE  You will be given one or more of the following:  A  medicine to help you relax (sedative).  A medicine to numb the area (local anesthetic).  You will be awake during the procedure. You will need to be able to talk with the health care provider during the procedure.  With the help of a type of X-ray (fluoroscopy), the health care provider will insert a radiofrequency needle into the area to be treated.  Next, a wire that carries the radio waves (electrode) will be put through the radiofrequency needle. An electrical pulse will be sent through the electrode to verify the correct nerve. You will feel a tingling sensation, and you may have muscle twitching.  Then, the tissue that is around the needle tip will be heated by an electric current that is passed using the radiofrequency machine. This will numb the nerves.  A bandage (dressing) will be put on the insertion area after the procedure is done. The procedure may vary among health care providers and hospitals. AFTER THE PROCEDURE  Your  blood pressure, heart rate, breathing rate, and blood oxygen level will be monitored often until the medicines you were given have worn off.  Return to your normal activities as directed by your health care provider.   This information is not intended to replace advice given to you by your health care provider. Make sure you discuss any questions you have with your health care provider.   Document Released: 09/12/2010 Document Revised: 10/05/2014 Document Reviewed: 02/21/2014 Elsevier Interactive Patient Education 2016 [employer] Pain Management Discharge Instructions  General Discharge Instructions :  If you need to reach your doctor call: Monday-Friday 8:00 am - 4:00 pm at [phone] or toll free [phone].  After clinic hours [phone] to have operator reach doctor.  Bring all of your medication bottles to all your appointments in the pain clinic.  To cancel or reschedule your appointment with Pain Management please remember to call 24 hours in advance to avoid a fee.  Refer to the educational materials which you have been given on: General Risks, I had my Procedure. Discharge Instructions, Post Sedation.  Post Procedure Instructions:  The drugs you were given will stay in your system until tomorrow, so for the next 24 hours you should not drive, make any legal decisions or drink any alcoholic beverages.  You may eat anything you prefer, but it is better to start with liquids then soups and crackers, and gradually work up to solid foods.  Please notify your doctor immediately if you have any unusual bleeding, trouble breathing or pain that is not related to your normal pain.  Depending on the type of procedure that was done, some parts of your body may feel week and/or numb.  This usually clears up by tonight or the next day.  Walk with the use of an assistive device or accompanied by an adult for the 24 hours.  You may use ice on the affected area for the first 24  hours.  Put ice in a Ziploc bag and cover with a towel and place against area 15 minutes on 15 minutes off.  You may switch to heat after 24 hours.

## 2015-11-23 NOTE — Progress Notes (Signed)
Patient's Name: [name]  MRN: [id number]  Referring Provider: Milinda Pointer, MD  DOB: [birth year]  PCP: Leonides Sake, MD  DOS: 11/23/2015  Note by: Kathlen Brunswick. Dossie Arbour, MD  Service setting: Ambulatory outpatient  Location: [facility] (AMB) Pain Management Facility  Visit type: Procedure  Specialty: Interventional Pain Management  Patient type: Established   Primary Reason for Visit: Interventional Pain Management Treatment. CC: Back Pain (low, left is worse than right)  Procedure:  Anesthesia, Analgesia, Anxiolysis:  Type: Therapeutic Medial Branch Facet Radiofrequency Ablation Region: Lumbar Level: L2, L3, L4, L5, & S1 Medial Branch Level(s) Laterality: Left-Sided  Type: Local Anesthesia with Moderate (Conscious) Sedation Local Anesthetic: Lidocaine 1% Route: Intravenous (IV) IV Access: Secured Sedation: Meaningful verbal contact was maintained at all times during the procedure  Indication(s): Analgesia and Anxiety  Indications: 1. Lumbar facet syndrome (Location of Primary Source of Pain) (Bilateral) (L>R)   2. Chronic low back pain (Location of Primary Source of Pain) (Bilateral) (L>R)   3. Lumbar spondylosis   4. Acute postoperative pain    [name] has either failed to respond, was unable to tolerate, or simply did not get enough benefit from other more conservative therapies including, but not limited to: 1. Over-the-counter medications 2. Anti-inflammatory medications 3. Muscle relaxants 4. Membrane stabilizers 5. Opioids 6. Physical therapy 7. Modalities (Heat, ice, etc.) 8. Invasive techniques such as nerve blocks. [name] has attained more than 50% relief of the pain from a series of diagnostic injections conducted in separate occasions.  Pain Score: Pre-procedure: 2 /10 Post-procedure: 0-No pain/10  Pre-Procedure Assessment:  [name] is a 70 y.o. (year old), female patient, seen today for interventional treatment. She  has a past  surgical history that includes Vaginal hysterectomy (1975); Incontinence surgery (2001); Oophorectomy (1978); Elbow Arthroplasty (1990); Cholecystectomy (1989); Rectocele repair (2009); back implant (2015); Augmentation mammaplasty (Bilateral, 1978); and Mastectomy partial / lumpectomy (Bilateral, 1978 ).. Her primarily concern today is the Back Pain (low, left is worse than right) The primary encounter diagnosis was Lumbar facet syndrome (Location of Primary Source of Pain) (Bilateral) (L>R). Diagnoses of Chronic low back pain (Location of Primary Source of Pain) (Bilateral) (L>R), Lumbar spondylosis, and Acute postoperative pain were also pertinent to this visit.  Pain Location: Back Pain Descriptors / Indicators: Aching, Constant, Dull Pain Frequency: Constant  Date of Last Visit: 11/01/15 Service Provided on Last Visit: Med Refill, Evaluation  Coagulation Parameters Lab Results  Component Value Date   PLT 232 06/30/2015   Verification of the correct person, correct site (including marking of site), and correct procedure were performed and confirmed by the patient.  Consent: Before the procedure and under the influence of no sedative(s), amnesic(s), or anxiolytics, the patient was informed of the treatment options, risks and possible complications. To fulfill our ethical and legal obligations, as recommended by the American Medical Association's Code of Ethics, I have informed the patient of my clinical impression; the nature and purpose of the treatment or procedure; the risks, benefits, and possible complications of the intervention; the alternatives, including doing nothing; the risk(s) and benefit(s) of the alternative treatment(s) or procedure(s); and the risk(s) and benefit(s) of doing nothing. The patient was provided information about the general risks and possible complications associated with the procedure. These may include, but are not limited to: failure to achieve desired goals,  infection, bleeding, organ or nerve damage, allergic reactions, paralysis, and death. In addition, the patient was informed of those risks and complications associated to Spine-related procedures,  such as failure to decrease pain; infection (i.e.: Meningitis, epidural or intraspinal abscess); bleeding (i.e.: epidural hematoma, subarachnoid hemorrhage, or any other type of intraspinal or peri-dural bleeding); organ or nerve damage (i.e.: Any type of peripheral nerve, nerve root, or spinal cord injury) with subsequent damage to sensory, motor, and/or autonomic systems, resulting in permanent pain, numbness, and/or weakness of one or several areas of the body; allergic reactions; (i.e.: anaphylactic reaction); and/or death. Furthermore, the patient was informed of those risks and complications associated with the medications. These include, but are not limited to: allergic reactions (i.e.: anaphylactic or anaphylactoid reaction(s)); adrenal axis suppression; blood sugar elevation that in diabetics may result in ketoacidosis or comma; water retention that in patients with history of congestive heart failure may result in shortness of breath, pulmonary edema, and decompensation with resultant heart failure; weight gain; swelling or edema; medication-induced neural toxicity; particulate matter embolism and blood vessel occlusion with resultant organ, and/or nervous system infarction; and/or aseptic necrosis of one or more joints. Finally, the patient was informed that Medicine is not an exact science; therefore, there is also the possibility of unforeseen or unpredictable risks and/or possible complications that may result in a catastrophic outcome. The patient indicated having understood very clearly. We have given the patient no guarantees and we have made no promises. Enough time was given to the patient to ask questions, all of which were answered to the patient's satisfaction. [name] has indicated that she  wanted to continue with the procedure.  Consent Attestation: I, the ordering provider, attest that I have discussed with the patient the benefits, risks, side-effects, alternatives, likelihood of achieving goals, and potential problems during recovery for the procedure that I have provided informed consent.  Pre-Procedure Preparation:  Safety Precautions: Allergies reviewed. The patient was asked about blood thinners, or active infections, both of which were denied. The patient was asked to confirm the procedure and laterality, before marking the site, and again before commencing the procedure. Appropriate site, procedure, and patient were confirmed by following the Joint Commission's Universal Protocol (UP.01.01.01), in the form of a "Time Out". The patient was asked to participate by confirming the accuracy of the "Time Out" information. Patient was assessed for positional comfort and pressure points before starting the procedure. Allergies: She is allergic to fentanyl; midazolam hcl; morphine; niacin and related; and penicillins. Allergy Precautions: None required Infection Control Precautions: Sterile technique used. Standard Universal Precautions were taken as recommended by the Department of [facility] for Disease Control and Prevention (CDC). Standard pre-surgical skin prep was conducted. Respiratory hygiene and cough etiquette was practiced. Hand hygiene observed. Safe injection practices and needle disposal techniques followed. SDV (single dose vial) medications used. Medications properly checked for expiration dates and contaminants. Personal protective equipment (PPE) used as per protocol. Monitoring:  As per clinic protocol. Vitals:   11/23/15 1300 11/23/15 1310 11/23/15 1320 11/23/15 1329  BP: (!) 149/66 (!) 155/71 (!) 170/82 (!) 161/75  Pulse: 62 62 (!) 58 62  Resp: 10 13 14 16   Temp:      TempSrc:      SpO2: 100% 99% 99% 100%  Weight:      Height:      Calculated BMI:  Body mass index is 25.89 kg/m. Time-out: "Time-out" completed before starting procedure, as per protocol.  Description of Procedure Process:   Time-out: "Time-out" completed before starting procedure, as per protocol. Position: Prone Target Area: For Lumbar Facet blocks, the target is the groove formed by the  junction of the transverse process and superior articular process. For the L5 dorsal ramus, the target is the notch between superior articular process and sacral ala. For the S1 dorsal ramus, the target is the superior and lateral edge of the posterior S1 Sacral foramen. Approach: Paraspinal approach. Area Prepped: Entire Posterior Lumbosacral Region Prepping solution: ChloraPrep (2% chlorhexidine gluconate and 70% isopropyl alcohol) Safety Precautions: Aspiration looking for blood return was conducted prior to all injections. At no point did we inject any substances, as a needle was being advanced. No attempts were made at seeking any paresthesias. Safe injection practices and needle disposal techniques used. Medications properly checked for expiration dates. SDV (single dose vial) medications used. Description of the Procedure: Protocol guidelines were followed. The patient was placed in position over the fluoroscopy table. The target area was identified and the area prepped in the usual manner. Skin desensitized using vapocoolant spray. Skin & deeper tissues infiltrated with local anesthetic. Appropriate amount of time allowed to pass for local anesthetics to take effect. Radiofrequency needles were introduced to the area of the medial branch at the junction of the superior articular process and transverse process using fluoroscopy. Using the [employer], sensory stimulation using 50 Hz was used to locate & identify the nerve, making sure that the needle was positioned such that there was no sensory stimulation below 0.3 V or above 0.7 V. Stimulation using 2 Hz was used to  evaluate the motor component. Care was taken not to lesion any nerves that demonstrated motor stimulation of the lower extremities at an output of less than 2.5 times that of the sensory threshold, or a maximum of 2.0 V. Once satisfactory placement of the needles was achieved, the above solution was slowly injected after negative aspiration. After waiting for at least 2 minutes, the ablation was performed at 80 degrees C for 60 seconds.The needles were then removed and the area cleansed, making sure to leave some of the prepping solution back to take advantage of its long term bactericidal properties. EBL: None Materials & Medications Used:  Needle(s) Used: Teflon-Coated Radiofrequency Needles  Imaging Guidance (Spinal):  Type of Imaging Technique: Fluoroscopy Guidance (Spinal) Indication(s): Assistance in needle guidance and placement for procedures requiring needle placement in or near specific anatomical locations not easily accessible without such assistance. Exposure Time: Please see nurses notes. Contrast: None used. Fluoroscopic Guidance: I was personally present during the use of fluoroscopy. "Tunnel Vision Technique" used to obtain the best possible view of the target area. Parallax error corrected before commencing the procedure. "Direction-depth-direction" technique used to introduce the needle under continuous pulsed fluoroscopy. Once target was reached, antero-posterior, oblique, and lateral fluoroscopic projection used confirm needle placement in all planes. Images permanently stored in EMR. Interpretation: No contrast injected. I personally interpreted the imaging intraoperatively. Adequate needle placement confirmed in multiple planes. Permanent images saved into the patient's record.  Antibiotic Prophylaxis:  Indication(s): No indications identified. Type:  Antibiotics Given (last 72 hours)    None      Post-operative Assessment:  Complications: No immediate post-treatment  complications observed by team, or reported by patient. Disposition: The patient tolerated the entire procedure well. A repeat set of vitals were taken after the procedure and the patient was kept under observation following institutional policy, for this type of procedure. Post-procedural neurological assessment was performed, showing return to baseline, prior to discharge. The patient was provided with post-procedure discharge instructions, including a section on how to identify potential problems. Should any problems arise  concerning this procedure, the patient was given instructions to immediately contact us, at any time, without hesitation. In any case, we plan to contact the patient by telephone for a follow-up status report regarding this interventional procedure. Comments:  No additional relevant information.  Plan of Care  Discharge to: Discharge home  Medications ordered for procedure: Meds ordered this encounter  Medications  . fentaNYL (SUBLIMAZE) 100 MCG/2ML injection    Sharlett Iles, Delores: cabinet override  . lactated ringers infusion 1,000 mL  . triamcinolone acetonide (KENALOG-40) injection 40 mg  . lidocaine (PF) (XYLOCAINE) 1 % injection 10 mL  . ropivacaine (PF) 2 mg/ml (0.2%) (NAROPIN) epidural 9 mL  . diphenhydrAMINE (BENADRYL) injection 12.5 mg  . ropivacaine (PF) 2 mg/ml (0.2%) (NAROPIN) 2 MG/ML epidural    TICE, KORI: cabinet override  . triamcinolone acetonide (KENALOG-40) 40 MG/ML injection    TICE, KORI: cabinet override  . midazolam (VERSED) 5 MG/5ML injection    TICE, KORI: cabinet override  . lidocaine (PF) (XYLOCAINE) 1 % injection    TICE, KORI: cabinet override  . diphenhydrAMINE (BENADRYL) 50 MG/ML injection    TICE, KORI: cabinet override  . oxyCODONE (OXY IR/ROXICODONE) 5 MG immediate release tablet    Sig: Take 1 tablet (5 mg total) by mouth every 6 (six) hours as needed for severe pain (For post-radiofrequency pain only).    Dispense:  60 tablet     Refill:  0    Do not place this medication, or any other prescription from our practice, on "Automatic Refill". Patient may have prescription filled one day early if pharmacy is closed on scheduled refill date. Do not fill until: 11/23/15 To last until: 12/08/15   Medications administered: (For more details, see medical record) We administered fentaNYL, diphenhydrAMINE, ropivacaine (PF) 2 mg/ml (0.2%), triamcinolone acetonide, and lidocaine (PF).  Imaging Ordered: No results found for this or any previous visit. New Prescriptions   OXYCODONE (OXY IR/ROXICODONE) 5 MG IMMEDIATE RELEASE TABLET    Take 1 tablet (5 mg total) by mouth every 6 (six) hours as needed for severe pain (For post-radiofrequency pain only).   Physician-requested Follow-up:  Return in about 6 weeks (around 01/04/2016) for Post-Procedure evaluation, In addition, opposite side RFA (2-6wks from now).  Future Appointments Date Time Provider [place]  01/03/2016 1:00 PM Milinda Pointer, MD [facility] None  02/13/2016 8:00 AM Milinda Pointer, MD [facility] None   Primary Care Physician: Leonides Sake, MD Location: [facility] Note by: Kathlen Brunswick. Dossie Arbour, M.D, DABA, DABAPM, DABPM, DABIPP, FIPP  Disclaimer:  Medicine is not an exact science. The only guarantee in medicine is that nothing is guaranteed. It is important to note that the decision to proceed with this intervention was based on the information collected from the patient. The Data and conclusions were drawn from the patient's questionnaire, the interview, and the physical examination. Because the information was provided in large part by the patient, it cannot be guaranteed that it has not been purposely or unconsciously manipulated. Every effort has been made to obtain as much relevant data as possible for this evaluation. It is important to note that the conclusions that lead to this procedure are derived in large part from  the available data. Always take into account that the treatment will also be dependent on availability of resources and existing treatment guidelines, considered by other Pain Management Practitioners as being common knowledge and practice, at the time of the intervention. For Medico-Legal purposes, it is also important to point  out that variation in procedural techniques and pharmacological choices are the acceptable norm. The indications, contraindications, technique, and results of the above procedure should only be interpreted and judged by a Board-Certified Interventional Pain Specialist with extensive familiarity and expertise in the same exact procedure and technique. Attempts at providing opinions without similar or greater experience and expertise than that of the treating physician will be considered as inappropriate and unethical, and shall result in a formal complaint to the state medical board and applicable specialty societies.  Instructions provided at this appointment: Patient Instructions  Facet Joint Block The facet joints connect the bones of the spine (vertebrae). They make it possible for you to bend, twist, and make other movements with your spine. They also prevent you from overbending, overtwisting, and making other excessive movements.  A facet joint block is a procedure where a numbing medicine (anesthetic) is injected into a facet joint. Often, a type of anti-inflammatory medicine called a steroid is also injected. A facet joint block may be done for two reasons:   Diagnosis. A facet joint block may be done as a test to see whether neck or back pain is caused by a worn-down or infected facet joint. If the pain gets better after a facet joint block, it means the pain is probably coming from the facet joint. If the pain does not get better, it means the pain is probably not coming from the facet joint.   Therapy. A facet joint block may be done to relieve neck or back pain caused  by a facet joint. A facet joint block is only done as a therapy if the pain does not improve with medicine, exercise programs, physical therapy, and other forms of pain management. LET [facility] CARE PROVIDER KNOW ABOUT:   Any allergies you have.   All medicines you are taking, including vitamins, herbs, eyedrops, and over-the-counter medicines and creams.   Previous problems you or members of your family have had with the use of anesthetics.   Any blood disorders you have had.   Other health problems you have. RISKS AND COMPLICATIONS Generally, having a facet joint block is safe. However, as with any procedure, complications can occur. Possible complications associated with having a facet joint block include:   Bleeding.   Injury to a nerve near the injection site.   Pain at the injection site.   Weakness or numbness in areas controlled by nerves near the injection site.   Infection.   Temporary fluid retention.   Allergic reaction to anesthetics or medicines used during the procedure. BEFORE THE PROCEDURE   Follow your health care provider's instructions if you are taking dietary supplements or medicines. You may need to stop taking them or reduce your dosage.   Do not take any new dietary supplements or medicines without asking your health care provider first.   Follow your health care provider's instructions about eating and drinking before the procedure. You may need to stop eating and drinking several hours before the procedure.   Arrange to have an adult drive you home after the procedure. PROCEDURE  You may need to remove your clothing and dress in an open-back gown so that your health care provider can access your spine.   The procedure will be done while you are lying on an X-ray table. Most of the time you will be asked to lie on your stomach, but you may be asked to lie in a different position if an injection will be made  in your neck.   Special  machines will be used to monitor your oxygen levels, heart rate, and blood pressure.   If an injection will be made in your neck, an intravenous (IV) tube will be inserted into one of your veins. Fluids and medicine will flow directly into your body through the IV tube.   The area over the facet joint where the injection will be made will be cleaned with an antiseptic soap. The surrounding skin will be covered with sterile drapes.   An anesthetic will be applied to your skin to make the injection area numb. You may feel a temporary stinging or burning sensation.   A video X-ray machine will be used to locate the joint. A contrast dye may be injected into the facet joint area to help with locating the joint.   When the joint is located, an anesthetic medicine will be injected into the joint through the needle.   Your health care provider will ask you whether you feel pain relief. If you do feel relief, a steroid may be injected to provide pain relief for a longer period of time. If you do not feel relief or feel only partial relief, additional injections of an anesthetic may be made in other facet joints.   The needle will be removed, the skin will be cleansed, and bandages will be applied.  AFTER THE PROCEDURE   You will be observed for 15-30 minutes before being allowed to go home. Do not drive. Have an adult drive you or take a taxi or public transportation instead.   If you feel pain relief, the pain will return in several hours or days when the anesthetic wears off.   You may feel pain relief 2-14 days after the procedure. The amount of time this relief lasts varies from person to person.   It is normal to feel some tenderness over the injected area(s) for 2 days following the procedure.   If you have diabetes, you may have a temporary increase in blood sugar.   This information is not intended to replace advice given to you by your health care provider. Make sure you  discuss any questions you have with your health care provider.   Document Released: 06/05/2006 Document Revised: 02/04/2014 Document Reviewed: 11/04/2011 Elsevier Interactive Patient Education 2016 [place]. Radiofrequency Lesioning Radiofrequency lesioning is a procedure that is performed to relieve pain. The procedure is often used for back, neck, or arm pain. Radiofrequency lesioning involves the use of a machine that creates radio waves to make heat. During the procedure, the heat is applied to the nerve that carries the pain signal. The heat damages the nerve and interferes with the pain signal. Pain relief usually lasts for 6 months to 1 year. LET [facility] CARE PROVIDER KNOW ABOUT:  Any allergies you have.  All medicines you are taking, including vitamins, herbs, eye drops, creams, and over-the-counter medicines.  Previous problems you or members of your family have had with the use of anesthetics.  Any blood disorders you have.  Previous surgeries you have had.  Any medical conditions you have.  Whether you are pregnant or may be pregnant. RISKS AND COMPLICATIONS Generally, this is a safe procedure. However, problems may occur, including:  Pain or soreness at the injection site.  Infection at the injection site.  Damage to nerves or blood vessels. BEFORE THE PROCEDURE  Ask your health care provider about:  Changing or stopping your regular medicines. This is especially important  if you are taking diabetes medicines or blood thinners.  Taking medicines such as aspirin and ibuprofen. These medicines can thin your blood. Do not take these medicines before your procedure if your health care provider instructs you not to.  Follow instructions from your health care provider about eating or drinking restrictions.  Plan to have someone take you home after the procedure.  If you go home right after the procedure, plan to have someone with you for 24  hours. PROCEDURE  You will be given one or more of the following:  A medicine to help you relax (sedative).  A medicine to numb the area (local anesthetic).  You will be awake during the procedure. You will need to be able to talk with the health care provider during the procedure.  With the help of a type of X-ray (fluoroscopy), the health care provider will insert a radiofrequency needle into the area to be treated.  Next, a wire that carries the radio waves (electrode) will be put through the radiofrequency needle. An electrical pulse will be sent through the electrode to verify the correct nerve. You will feel a tingling sensation, and you may have muscle twitching.  Then, the tissue that is around the needle tip will be heated by an electric current that is passed using the radiofrequency machine. This will numb the nerves.  A bandage (dressing) will be put on the insertion area after the procedure is done. The procedure may vary among health care providers and hospitals. AFTER THE PROCEDURE  Your blood pressure, heart rate, breathing rate, and blood oxygen level will be monitored often until the medicines you were given have worn off.  Return to your normal activities as directed by your health care provider.   This information is not intended to replace advice given to you by your health care provider. Make sure you discuss any questions you have with your health care provider.   Document Released: 09/12/2010 Document Revised: 10/05/2014 Document Reviewed: 02/21/2014 Elsevier Interactive Patient Education 2016 [employer] Pain Management Discharge Instructions  General Discharge Instructions :  If you need to reach your doctor call: Monday-Friday 8:00 am - 4:00 pm at [phone] or toll free [phone].  After clinic hours [phone] to have operator reach doctor.  Bring all of your medication bottles to all your appointments in the pain clinic.  To cancel or  reschedule your appointment with Pain Management please remember to call 24 hours in advance to avoid a fee.  Refer to the educational materials which you have been given on: General Risks, I had my Procedure. Discharge Instructions, Post Sedation.  Post Procedure Instructions:  The drugs you were given will stay in your system until tomorrow, so for the next 24 hours you should not drive, make any legal decisions or drink any alcoholic beverages.  You may eat anything you prefer, but it is better to start with liquids then soups and crackers, and gradually work up to solid foods.  Please notify your doctor immediately if you have any unusual bleeding, trouble breathing or pain that is not related to your normal pain.  Depending on the type of procedure that was done, some parts of your body may feel week and/or numb.  This usually clears up by tonight or the next day.  Walk with the use of an assistive device or accompanied by an adult for the 24 hours.  You may use ice on the affected area for the first 24 hours.  Put  ice in a Ziploc bag and cover with a towel and place against area 15 minutes on 15 minutes off.  You may switch to heat after 24 hours.

## 2015-11-23 NOTE — Progress Notes (Signed)
Safety precautions to be maintained throughout the outpatient stay will include: orient to surroundings, keep bed in low position, maintain call bell within reach at all times, provide assistance with transfer out of bed and ambulation.  

## 2015-11-24 NOTE — Telephone Encounter (Signed)
States had a rough night after procedure, but expects it to be sore for a while- pt is using ice packs- 15 on 15 off. Instructed to switch to warm today- informed of the extra meds to take if needed.

## 2015-11-30 DIAGNOSIS — Z6826 Body mass index (BMI) 26.0-26.9, adult: Secondary | ICD-10-CM | POA: Diagnosis not present

## 2015-11-30 DIAGNOSIS — I959 Hypotension, unspecified: Secondary | ICD-10-CM | POA: Diagnosis not present

## 2015-11-30 DIAGNOSIS — F419 Anxiety disorder, unspecified: Secondary | ICD-10-CM | POA: Diagnosis not present

## 2015-12-01 ENCOUNTER — Telehealth: Payer: Self-pay | Admitting: *Deleted

## 2015-12-07 DIAGNOSIS — R509 Fever, unspecified: Secondary | ICD-10-CM | POA: Diagnosis not present

## 2015-12-07 DIAGNOSIS — R6889 Other general symptoms and signs: Secondary | ICD-10-CM | POA: Diagnosis not present

## 2015-12-10 DIAGNOSIS — N390 Urinary tract infection, site not specified: Secondary | ICD-10-CM | POA: Diagnosis not present

## 2015-12-10 DIAGNOSIS — S00511A Abrasion of lip, initial encounter: Secondary | ICD-10-CM | POA: Diagnosis present

## 2015-12-10 DIAGNOSIS — M549 Dorsalgia, unspecified: Secondary | ICD-10-CM | POA: Diagnosis present

## 2015-12-10 DIAGNOSIS — N3000 Acute cystitis without hematuria: Secondary | ICD-10-CM | POA: Diagnosis not present

## 2015-12-10 DIAGNOSIS — I10 Essential (primary) hypertension: Secondary | ICD-10-CM | POA: Diagnosis present

## 2015-12-10 DIAGNOSIS — N12 Tubulo-interstitial nephritis, not specified as acute or chronic: Secondary | ICD-10-CM | POA: Diagnosis present

## 2015-12-10 DIAGNOSIS — Z9181 History of falling: Secondary | ICD-10-CM | POA: Diagnosis not present

## 2015-12-10 DIAGNOSIS — M545 Low back pain: Secondary | ICD-10-CM | POA: Diagnosis not present

## 2015-12-10 DIAGNOSIS — G8929 Other chronic pain: Secondary | ICD-10-CM | POA: Diagnosis present

## 2015-12-10 DIAGNOSIS — R509 Fever, unspecified: Secondary | ICD-10-CM | POA: Diagnosis not present

## 2015-12-10 DIAGNOSIS — B9689 Other specified bacterial agents as the cause of diseases classified elsewhere: Secondary | ICD-10-CM | POA: Diagnosis not present

## 2015-12-10 DIAGNOSIS — Z8249 Family history of ischemic heart disease and other diseases of the circulatory system: Secondary | ICD-10-CM | POA: Diagnosis not present

## 2015-12-10 DIAGNOSIS — R7881 Bacteremia: Secondary | ICD-10-CM | POA: Diagnosis not present

## 2015-12-10 DIAGNOSIS — E876 Hypokalemia: Secondary | ICD-10-CM | POA: Diagnosis present

## 2015-12-10 DIAGNOSIS — S0081XA Abrasion of other part of head, initial encounter: Secondary | ICD-10-CM | POA: Diagnosis present

## 2015-12-10 DIAGNOSIS — R531 Weakness: Secondary | ICD-10-CM | POA: Diagnosis present

## 2015-12-10 DIAGNOSIS — J45909 Unspecified asthma, uncomplicated: Secondary | ICD-10-CM | POA: Diagnosis present

## 2015-12-10 DIAGNOSIS — G4700 Insomnia, unspecified: Secondary | ICD-10-CM | POA: Diagnosis present

## 2015-12-10 DIAGNOSIS — Z880 Allergy status to penicillin: Secondary | ICD-10-CM | POA: Diagnosis not present

## 2015-12-10 DIAGNOSIS — N16 Renal tubulo-interstitial disorders in diseases classified elsewhere: Secondary | ICD-10-CM | POA: Diagnosis present

## 2015-12-10 DIAGNOSIS — Z7982 Long term (current) use of aspirin: Secondary | ICD-10-CM | POA: Diagnosis not present

## 2015-12-10 DIAGNOSIS — Z452 Encounter for adjustment and management of vascular access device: Secondary | ICD-10-CM | POA: Diagnosis not present

## 2015-12-10 DIAGNOSIS — R296 Repeated falls: Secondary | ICD-10-CM | POA: Diagnosis not present

## 2015-12-10 DIAGNOSIS — A4151 Sepsis due to Escherichia coli [E. coli]: Secondary | ICD-10-CM | POA: Diagnosis present

## 2015-12-10 DIAGNOSIS — Z87891 Personal history of nicotine dependence: Secondary | ICD-10-CM | POA: Diagnosis not present

## 2015-12-15 DIAGNOSIS — A4151 Sepsis due to Escherichia coli [E. coli]: Secondary | ICD-10-CM | POA: Diagnosis not present

## 2015-12-15 DIAGNOSIS — J449 Chronic obstructive pulmonary disease, unspecified: Secondary | ICD-10-CM | POA: Diagnosis not present

## 2015-12-15 DIAGNOSIS — N12 Tubulo-interstitial nephritis, not specified as acute or chronic: Secondary | ICD-10-CM | POA: Diagnosis not present

## 2015-12-15 DIAGNOSIS — G8929 Other chronic pain: Secondary | ICD-10-CM | POA: Diagnosis not present

## 2015-12-15 DIAGNOSIS — I10 Essential (primary) hypertension: Secondary | ICD-10-CM | POA: Diagnosis not present

## 2015-12-15 DIAGNOSIS — N3000 Acute cystitis without hematuria: Secondary | ICD-10-CM | POA: Diagnosis not present

## 2015-12-16 DIAGNOSIS — J449 Chronic obstructive pulmonary disease, unspecified: Secondary | ICD-10-CM | POA: Diagnosis not present

## 2015-12-16 DIAGNOSIS — N12 Tubulo-interstitial nephritis, not specified as acute or chronic: Secondary | ICD-10-CM | POA: Diagnosis not present

## 2015-12-16 DIAGNOSIS — I10 Essential (primary) hypertension: Secondary | ICD-10-CM | POA: Diagnosis not present

## 2015-12-16 DIAGNOSIS — G8929 Other chronic pain: Secondary | ICD-10-CM | POA: Diagnosis not present

## 2015-12-16 DIAGNOSIS — N3000 Acute cystitis without hematuria: Secondary | ICD-10-CM | POA: Diagnosis not present

## 2015-12-16 DIAGNOSIS — A4151 Sepsis due to Escherichia coli [E. coli]: Secondary | ICD-10-CM | POA: Diagnosis not present

## 2015-12-18 DIAGNOSIS — A4151 Sepsis due to Escherichia coli [E. coli]: Secondary | ICD-10-CM | POA: Diagnosis not present

## 2015-12-18 DIAGNOSIS — B009 Herpesviral infection, unspecified: Secondary | ICD-10-CM | POA: Diagnosis not present

## 2015-12-18 DIAGNOSIS — Z6825 Body mass index (BMI) 25.0-25.9, adult: Secondary | ICD-10-CM | POA: Diagnosis not present

## 2015-12-20 DIAGNOSIS — N3000 Acute cystitis without hematuria: Secondary | ICD-10-CM | POA: Diagnosis not present

## 2015-12-20 DIAGNOSIS — G8929 Other chronic pain: Secondary | ICD-10-CM | POA: Diagnosis not present

## 2015-12-20 DIAGNOSIS — N12 Tubulo-interstitial nephritis, not specified as acute or chronic: Secondary | ICD-10-CM | POA: Diagnosis not present

## 2015-12-20 DIAGNOSIS — A4151 Sepsis due to Escherichia coli [E. coli]: Secondary | ICD-10-CM | POA: Diagnosis not present

## 2015-12-20 DIAGNOSIS — I10 Essential (primary) hypertension: Secondary | ICD-10-CM | POA: Diagnosis not present

## 2015-12-20 DIAGNOSIS — J449 Chronic obstructive pulmonary disease, unspecified: Secondary | ICD-10-CM | POA: Diagnosis not present

## 2015-12-27 ENCOUNTER — Ambulatory Visit: Payer: Medicare Other | Admitting: Pain Medicine

## 2015-12-27 DIAGNOSIS — N3000 Acute cystitis without hematuria: Secondary | ICD-10-CM | POA: Diagnosis not present

## 2015-12-27 DIAGNOSIS — J449 Chronic obstructive pulmonary disease, unspecified: Secondary | ICD-10-CM | POA: Diagnosis not present

## 2015-12-27 DIAGNOSIS — G8929 Other chronic pain: Secondary | ICD-10-CM | POA: Diagnosis not present

## 2015-12-27 DIAGNOSIS — N12 Tubulo-interstitial nephritis, not specified as acute or chronic: Secondary | ICD-10-CM | POA: Diagnosis not present

## 2015-12-27 DIAGNOSIS — A4151 Sepsis due to Escherichia coli [E. coli]: Secondary | ICD-10-CM | POA: Diagnosis not present

## 2015-12-27 DIAGNOSIS — I10 Essential (primary) hypertension: Secondary | ICD-10-CM | POA: Diagnosis not present

## 2016-01-01 DIAGNOSIS — E782 Mixed hyperlipidemia: Secondary | ICD-10-CM | POA: Diagnosis not present

## 2016-01-01 DIAGNOSIS — I10 Essential (primary) hypertension: Secondary | ICD-10-CM | POA: Diagnosis not present

## 2016-01-01 DIAGNOSIS — R609 Edema, unspecified: Secondary | ICD-10-CM | POA: Diagnosis not present

## 2016-01-01 DIAGNOSIS — Z09 Encounter for follow-up examination after completed treatment for conditions other than malignant neoplasm: Secondary | ICD-10-CM | POA: Diagnosis not present

## 2016-01-01 DIAGNOSIS — F419 Anxiety disorder, unspecified: Secondary | ICD-10-CM | POA: Diagnosis not present

## 2016-01-01 DIAGNOSIS — N952 Postmenopausal atrophic vaginitis: Secondary | ICD-10-CM | POA: Diagnosis not present

## 2016-01-01 DIAGNOSIS — Z6825 Body mass index (BMI) 25.0-25.9, adult: Secondary | ICD-10-CM | POA: Diagnosis not present

## 2016-01-03 ENCOUNTER — Encounter: Payer: Self-pay | Admitting: Pain Medicine

## 2016-01-03 ENCOUNTER — Ambulatory Visit: Payer: Medicare Other | Attending: Pain Medicine | Admitting: Pain Medicine

## 2016-01-03 VITALS — BP 113/62 | HR 70 | Temp 97.7°F | Resp 16 | Ht 61.0 in | Wt 139.0 lb

## 2016-01-03 DIAGNOSIS — M47816 Spondylosis without myelopathy or radiculopathy, lumbar region: Secondary | ICD-10-CM

## 2016-01-03 DIAGNOSIS — M961 Postlaminectomy syndrome, not elsewhere classified: Secondary | ICD-10-CM | POA: Insufficient documentation

## 2016-01-03 DIAGNOSIS — J449 Chronic obstructive pulmonary disease, unspecified: Secondary | ICD-10-CM | POA: Insufficient documentation

## 2016-01-03 DIAGNOSIS — Z7982 Long term (current) use of aspirin: Secondary | ICD-10-CM | POA: Insufficient documentation

## 2016-01-03 DIAGNOSIS — M545 Low back pain, unspecified: Secondary | ICD-10-CM

## 2016-01-03 DIAGNOSIS — E669 Obesity, unspecified: Secondary | ICD-10-CM | POA: Insufficient documentation

## 2016-01-03 DIAGNOSIS — M9983 Other biomechanical lesions of lumbar region: Secondary | ICD-10-CM | POA: Diagnosis not present

## 2016-01-03 DIAGNOSIS — G2581 Restless legs syndrome: Secondary | ICD-10-CM | POA: Diagnosis not present

## 2016-01-03 DIAGNOSIS — M25552 Pain in left hip: Secondary | ICD-10-CM | POA: Diagnosis not present

## 2016-01-03 DIAGNOSIS — G4700 Insomnia, unspecified: Secondary | ICD-10-CM | POA: Insufficient documentation

## 2016-01-03 DIAGNOSIS — K219 Gastro-esophageal reflux disease without esophagitis: Secondary | ICD-10-CM | POA: Insufficient documentation

## 2016-01-03 DIAGNOSIS — R5382 Chronic fatigue, unspecified: Secondary | ICD-10-CM | POA: Diagnosis not present

## 2016-01-03 DIAGNOSIS — G894 Chronic pain syndrome: Secondary | ICD-10-CM | POA: Diagnosis not present

## 2016-01-03 DIAGNOSIS — M79604 Pain in right leg: Secondary | ICD-10-CM | POA: Diagnosis not present

## 2016-01-03 DIAGNOSIS — F411 Generalized anxiety disorder: Secondary | ICD-10-CM | POA: Insufficient documentation

## 2016-01-03 DIAGNOSIS — K449 Diaphragmatic hernia without obstruction or gangrene: Secondary | ICD-10-CM | POA: Diagnosis not present

## 2016-01-03 DIAGNOSIS — K589 Irritable bowel syndrome without diarrhea: Secondary | ICD-10-CM | POA: Diagnosis not present

## 2016-01-03 DIAGNOSIS — Z79891 Long term (current) use of opiate analgesic: Secondary | ICD-10-CM | POA: Diagnosis not present

## 2016-01-03 DIAGNOSIS — M25551 Pain in right hip: Secondary | ICD-10-CM | POA: Insufficient documentation

## 2016-01-03 DIAGNOSIS — M1288 Other specific arthropathies, not elsewhere classified, other specified site: Secondary | ICD-10-CM | POA: Diagnosis not present

## 2016-01-03 DIAGNOSIS — M48061 Spinal stenosis, lumbar region without neurogenic claudication: Secondary | ICD-10-CM

## 2016-01-03 DIAGNOSIS — M79605 Pain in left leg: Secondary | ICD-10-CM | POA: Diagnosis not present

## 2016-01-03 DIAGNOSIS — M542 Cervicalgia: Secondary | ICD-10-CM | POA: Diagnosis not present

## 2016-01-03 DIAGNOSIS — G8929 Other chronic pain: Secondary | ICD-10-CM | POA: Diagnosis not present

## 2016-01-03 DIAGNOSIS — I10 Essential (primary) hypertension: Secondary | ICD-10-CM | POA: Diagnosis not present

## 2016-01-03 DIAGNOSIS — M797 Fibromyalgia: Secondary | ICD-10-CM | POA: Diagnosis not present
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
## 2016-01-03 NOTE — Progress Notes (Signed)
Safety precautions to be maintained throughout the outpatient stay will include: orient to surroundings, keep bed in low position, maintain call bell within reach at all times, provide assistance with transfer out of bed and ambulation.  

## 2016-01-16 DIAGNOSIS — J4520 Mild intermittent asthma, uncomplicated: Secondary | ICD-10-CM | POA: Diagnosis not present

## 2016-01-24 DIAGNOSIS — Z6826 Body mass index (BMI) 26.0-26.9, adult: Secondary | ICD-10-CM | POA: Diagnosis not present

## 2016-01-24 DIAGNOSIS — R350 Frequency of micturition: Secondary | ICD-10-CM | POA: Diagnosis not present

## 2016-02-07 DIAGNOSIS — J069 Acute upper respiratory infection, unspecified: Secondary | ICD-10-CM | POA: Diagnosis not present

## 2016-02-07 DIAGNOSIS — E663 Overweight: Secondary | ICD-10-CM | POA: Diagnosis not present

## 2016-02-07 DIAGNOSIS — Z6827 Body mass index (BMI) 27.0-27.9, adult: Secondary | ICD-10-CM | POA: Diagnosis not present

## 2016-02-13 ENCOUNTER — Other Ambulatory Visit: Payer: Self-pay | Admitting: Pain Medicine

## 2016-02-13 ENCOUNTER — Encounter: Payer: Self-pay | Admitting: Pain Medicine

## 2016-02-13 ENCOUNTER — Ambulatory Visit: Payer: Medicare Other | Attending: Pain Medicine | Admitting: Pain Medicine

## 2016-02-13 VITALS — BP 141/93 | HR 59 | Temp 97.8°F | Resp 18 | Ht 61.0 in | Wt 134.0 lb

## 2016-02-13 DIAGNOSIS — Z79891 Long term (current) use of opiate analgesic: Secondary | ICD-10-CM | POA: Diagnosis not present

## 2016-02-13 DIAGNOSIS — F411 Generalized anxiety disorder: Secondary | ICD-10-CM | POA: Insufficient documentation

## 2016-02-13 DIAGNOSIS — G4700 Insomnia, unspecified: Secondary | ICD-10-CM | POA: Insufficient documentation

## 2016-02-13 DIAGNOSIS — F1190 Opioid use, unspecified, uncomplicated: Secondary | ICD-10-CM | POA: Diagnosis not present

## 2016-02-13 DIAGNOSIS — M79605 Pain in left leg: Secondary | ICD-10-CM | POA: Diagnosis not present

## 2016-02-13 DIAGNOSIS — R5382 Chronic fatigue, unspecified: Secondary | ICD-10-CM | POA: Diagnosis not present

## 2016-02-13 DIAGNOSIS — M48061 Spinal stenosis, lumbar region without neurogenic claudication: Secondary | ICD-10-CM | POA: Insufficient documentation

## 2016-02-13 DIAGNOSIS — M8580 Other specified disorders of bone density and structure, unspecified site: Secondary | ICD-10-CM | POA: Insufficient documentation

## 2016-02-13 DIAGNOSIS — E669 Obesity, unspecified: Secondary | ICD-10-CM | POA: Diagnosis not present

## 2016-02-13 DIAGNOSIS — M79604 Pain in right leg: Secondary | ICD-10-CM | POA: Insufficient documentation

## 2016-02-13 DIAGNOSIS — K219 Gastro-esophageal reflux disease without esophagitis: Secondary | ICD-10-CM | POA: Diagnosis not present

## 2016-02-13 DIAGNOSIS — K589 Irritable bowel syndrome without diarrhea: Secondary | ICD-10-CM | POA: Diagnosis not present

## 2016-02-13 DIAGNOSIS — G2581 Restless legs syndrome: Secondary | ICD-10-CM | POA: Diagnosis not present

## 2016-02-13 DIAGNOSIS — F410 Panic disorder [episodic paroxysmal anxiety] without agoraphobia: Secondary | ICD-10-CM | POA: Diagnosis not present

## 2016-02-13 DIAGNOSIS — M25551 Pain in right hip: Secondary | ICD-10-CM | POA: Diagnosis not present

## 2016-02-13 DIAGNOSIS — G894 Chronic pain syndrome: Secondary | ICD-10-CM

## 2016-02-13 DIAGNOSIS — M47816 Spondylosis without myelopathy or radiculopathy, lumbar region: Secondary | ICD-10-CM | POA: Diagnosis not present

## 2016-02-13 DIAGNOSIS — M797 Fibromyalgia: Secondary | ICD-10-CM | POA: Diagnosis not present

## 2016-02-13 DIAGNOSIS — I10 Essential (primary) hypertension: Secondary | ICD-10-CM | POA: Diagnosis not present

## 2016-02-13 DIAGNOSIS — M545 Low back pain: Secondary | ICD-10-CM | POA: Insufficient documentation

## 2016-02-13 DIAGNOSIS — M961 Postlaminectomy syndrome, not elsewhere classified: Secondary | ICD-10-CM | POA: Diagnosis not present

## 2016-02-13 DIAGNOSIS — M792 Neuralgia and neuritis, unspecified: Secondary | ICD-10-CM | POA: Diagnosis not present

## 2016-02-13 DIAGNOSIS — J449 Chronic obstructive pulmonary disease, unspecified: Secondary | ICD-10-CM | POA: Insufficient documentation

## 2016-02-13 DIAGNOSIS — M25552 Pain in left hip: Secondary | ICD-10-CM | POA: Insufficient documentation

## 2016-02-13 DIAGNOSIS — K449 Diaphragmatic hernia without obstruction or gangrene: Secondary | ICD-10-CM | POA: Diagnosis not present

## 2016-02-13 DIAGNOSIS — M7918 Myalgia, other site: Secondary | ICD-10-CM

## 2016-02-13 DIAGNOSIS — M810 Age-related osteoporosis without current pathological fracture: Secondary | ICD-10-CM | POA: Diagnosis not present

## 2016-02-13 DIAGNOSIS — M791 Myalgia: Secondary | ICD-10-CM | POA: Diagnosis not present

## 2016-02-13 DIAGNOSIS — M62830 Muscle spasm of back: Secondary | ICD-10-CM

## 2016-02-13 MED ORDER — OXYCODONE HCL 5 MG PO TABS: 5.0000 mg | ORAL_TABLET | Freq: Three times a day (TID) | ORAL | 0 refills | Status: DC | PRN

## 2016-02-13 MED ORDER — TIZANIDINE HCL 4 MG PO TABS: 4.0000 mg | ORAL_TABLET | Freq: Three times a day (TID) | ORAL | 2 refills | Status: DC | PRN

## 2016-02-13 MED ORDER — GABAPENTIN 300 MG PO CAPS: 300.0000 mg | ORAL_CAPSULE | Freq: Two times a day (BID) | ORAL | 2 refills | Status: DC

## 2016-02-13 NOTE — Progress Notes (Signed)
Nursing Pain Medication Assessment:  Safety precautions to be maintained throughout the outpatient stay will include: orient to surroundings, keep bed in low position, maintain call bell within reach at all times, provide assistance with transfer out of bed and ambulation.  Medication Inspection Compliance: Pill count conducted under aseptic conditions, in front of the patient. Neither the pills nor the bottle was removed from the patient's sight at any time. Once count was completed pills were immediately returned to the patient in their original bottle.  Medication: Oxycodone IR Pill Count: 43 of 90 pills remain Bottle Appearance: Standard pharmacy container. Clearly labeled. Filled Date: 01 / 02 / 2017 Medication last intake 02-13-16 at 0500  Has 15 tabs at home.
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
## 2016-02-13 NOTE — Patient Instructions (Addendum)
You were given 3 prescriptions for Oxycodone today. A prescripiton for Gabapentin and Tizanidine was sent to your pharmacy.

## 2016-02-21 ENCOUNTER — Ambulatory Visit: Payer: Medicare Other | Admitting: Pain Medicine

## 2016-02-21 LAB — TOXASSURE SELECT 13 (MW), URINE

## 2016-02-22 DIAGNOSIS — H2513 Age-related nuclear cataract, bilateral: Secondary | ICD-10-CM | POA: Diagnosis not present

## 2016-02-22 DIAGNOSIS — H353131 Nonexudative age-related macular degeneration, bilateral, early dry stage: Secondary | ICD-10-CM | POA: Diagnosis not present

## 2016-02-26 ENCOUNTER — Other Ambulatory Visit: Payer: Self-pay | Admitting: Family Medicine

## 2016-02-26 DIAGNOSIS — J069 Acute upper respiratory infection, unspecified: Secondary | ICD-10-CM | POA: Diagnosis not present

## 2016-02-26 DIAGNOSIS — Z1231 Encounter for screening mammogram for malignant neoplasm of breast: Secondary | ICD-10-CM | POA: Diagnosis not present

## 2016-02-26 DIAGNOSIS — E663 Overweight: Secondary | ICD-10-CM | POA: Diagnosis not present

## 2016-02-26 DIAGNOSIS — F418 Other specified anxiety disorders: Secondary | ICD-10-CM | POA: Diagnosis not present

## 2016-02-26 DIAGNOSIS — Z6827 Body mass index (BMI) 27.0-27.9, adult: Secondary | ICD-10-CM | POA: Diagnosis not present

## 2016-03-08 DIAGNOSIS — Z6828 Body mass index (BMI) 28.0-28.9, adult: Secondary | ICD-10-CM | POA: Diagnosis not present

## 2016-03-08 DIAGNOSIS — J209 Acute bronchitis, unspecified: Secondary | ICD-10-CM | POA: Diagnosis not present

## 2016-04-22 DIAGNOSIS — E663 Overweight: Secondary | ICD-10-CM | POA: Diagnosis not present

## 2016-04-22 DIAGNOSIS — Z6828 Body mass index (BMI) 28.0-28.9, adult: Secondary | ICD-10-CM | POA: Diagnosis not present

## 2016-04-22 DIAGNOSIS — R002 Palpitations: Secondary | ICD-10-CM | POA: Diagnosis not present

## 2016-04-22 DIAGNOSIS — F419 Anxiety disorder, unspecified: Secondary | ICD-10-CM | POA: Diagnosis not present

## 2016-04-22 DIAGNOSIS — I10 Essential (primary) hypertension: Secondary | ICD-10-CM | POA: Diagnosis not present

## 2016-04-22 DIAGNOSIS — E782 Mixed hyperlipidemia: Secondary | ICD-10-CM | POA: Diagnosis not present

## 2016-04-30 ENCOUNTER — Ambulatory Visit: Payer: Medicare Other | Attending: Pain Medicine | Admitting: Pain Medicine

## 2016-04-30 ENCOUNTER — Encounter: Payer: Self-pay | Admitting: Pain Medicine

## 2016-04-30 VITALS — BP 141/59 | HR 55 | Temp 97.6°F | Resp 16 | Ht 61.0 in | Wt 147.0 lb

## 2016-04-30 DIAGNOSIS — G2581 Restless legs syndrome: Secondary | ICD-10-CM | POA: Insufficient documentation

## 2016-04-30 DIAGNOSIS — M62830 Muscle spasm of back: Secondary | ICD-10-CM

## 2016-04-30 DIAGNOSIS — G4700 Insomnia, unspecified: Secondary | ICD-10-CM | POA: Insufficient documentation

## 2016-04-30 DIAGNOSIS — K589 Irritable bowel syndrome without diarrhea: Secondary | ICD-10-CM | POA: Insufficient documentation

## 2016-04-30 DIAGNOSIS — M791 Myalgia: Secondary | ICD-10-CM

## 2016-04-30 DIAGNOSIS — G894 Chronic pain syndrome: Secondary | ICD-10-CM | POA: Diagnosis present

## 2016-04-30 DIAGNOSIS — G8929 Other chronic pain: Secondary | ICD-10-CM | POA: Diagnosis not present

## 2016-04-30 DIAGNOSIS — M545 Low back pain, unspecified: Secondary | ICD-10-CM

## 2016-04-30 DIAGNOSIS — M25551 Pain in right hip: Secondary | ICD-10-CM | POA: Diagnosis not present

## 2016-04-30 DIAGNOSIS — M797 Fibromyalgia: Secondary | ICD-10-CM | POA: Diagnosis not present

## 2016-04-30 DIAGNOSIS — R5382 Chronic fatigue, unspecified: Secondary | ICD-10-CM | POA: Insufficient documentation

## 2016-04-30 DIAGNOSIS — F1120 Opioid dependence, uncomplicated: Secondary | ICD-10-CM | POA: Insufficient documentation

## 2016-04-30 DIAGNOSIS — F1190 Opioid use, unspecified, uncomplicated: Secondary | ICD-10-CM

## 2016-04-30 DIAGNOSIS — M1288 Other specific arthropathies, not elsewhere classified, other specified site: Secondary | ICD-10-CM | POA: Diagnosis not present

## 2016-04-30 DIAGNOSIS — Z79891 Long term (current) use of opiate analgesic: Secondary | ICD-10-CM | POA: Diagnosis not present

## 2016-04-30 DIAGNOSIS — E785 Hyperlipidemia, unspecified: Secondary | ICD-10-CM | POA: Diagnosis not present

## 2016-04-30 DIAGNOSIS — K219 Gastro-esophageal reflux disease without esophagitis: Secondary | ICD-10-CM | POA: Insufficient documentation

## 2016-04-30 DIAGNOSIS — N390 Urinary tract infection, site not specified: Secondary | ICD-10-CM | POA: Diagnosis not present

## 2016-04-30 DIAGNOSIS — M961 Postlaminectomy syndrome, not elsewhere classified: Secondary | ICD-10-CM | POA: Diagnosis not present

## 2016-04-30 DIAGNOSIS — G8918 Other acute postprocedural pain: Secondary | ICD-10-CM | POA: Insufficient documentation

## 2016-04-30 DIAGNOSIS — K449 Diaphragmatic hernia without obstruction or gangrene: Secondary | ICD-10-CM | POA: Diagnosis not present

## 2016-04-30 DIAGNOSIS — F411 Generalized anxiety disorder: Secondary | ICD-10-CM | POA: Insufficient documentation

## 2016-04-30 DIAGNOSIS — J449 Chronic obstructive pulmonary disease, unspecified: Secondary | ICD-10-CM | POA: Diagnosis not present

## 2016-04-30 DIAGNOSIS — Z7982 Long term (current) use of aspirin: Secondary | ICD-10-CM | POA: Insufficient documentation

## 2016-04-30 DIAGNOSIS — Z760 Encounter for issue of repeat prescription: Secondary | ICD-10-CM | POA: Insufficient documentation

## 2016-04-30 DIAGNOSIS — M47816 Spondylosis without myelopathy or radiculopathy, lumbar region: Secondary | ICD-10-CM

## 2016-04-30 DIAGNOSIS — M7918 Myalgia, other site: Secondary | ICD-10-CM

## 2016-04-30 DIAGNOSIS — E669 Obesity, unspecified: Secondary | ICD-10-CM | POA: Diagnosis not present

## 2016-04-30 DIAGNOSIS — M48061 Spinal stenosis, lumbar region without neurogenic claudication: Secondary | ICD-10-CM | POA: Diagnosis not present

## 2016-04-30 DIAGNOSIS — M48062 Spinal stenosis, lumbar region with neurogenic claudication: Secondary | ICD-10-CM | POA: Diagnosis not present

## 2016-04-30 DIAGNOSIS — I10 Essential (primary) hypertension: Secondary | ICD-10-CM | POA: Insufficient documentation

## 2016-04-30 DIAGNOSIS — M792 Neuralgia and neuritis, unspecified: Secondary | ICD-10-CM

## 2016-04-30 DIAGNOSIS — M25552 Pain in left hip: Secondary | ICD-10-CM | POA: Insufficient documentation

## 2016-04-30 DIAGNOSIS — M79605 Pain in left leg: Secondary | ICD-10-CM

## 2016-04-30 MED ORDER — OXYCODONE HCL 5 MG PO TABS: 5.0000 mg | ORAL_TABLET | Freq: Three times a day (TID) | ORAL | 0 refills | Status: DC | PRN

## 2016-04-30 MED ORDER — GABAPENTIN 300 MG PO CAPS: 300.0000 mg | ORAL_CAPSULE | Freq: Two times a day (BID) | ORAL | 2 refills | Status: DC

## 2016-04-30 MED ORDER — TIZANIDINE HCL 4 MG PO TABS: 4.0000 mg | ORAL_TABLET | Freq: Three times a day (TID) | ORAL | 2 refills | Status: DC | PRN

## 2016-04-30 NOTE — Progress Notes (Signed)
Nursing Pain Medication Assessment:  Safety precautions to be maintained throughout the outpatient stay will include: orient to surroundings, keep bed in low position, maintain call bell within reach at all times, provide assistance with transfer out of bed and ambulation.  Medication Inspection Compliance: Pill count conducted under aseptic conditions, in front of the patient. Neither the pills nor the bottle was removed from the patient's sight at any time. Once count was completed pills were immediately returned to the patient in their original bottle.  Medication: Oxycodone IR Pill/Patch Count: 80 of 90 pills remain03 Pill/Patch Appearance: Markings consistent with prescribed medication Bottle Appearance: Standard pharmacy container. Clearly labeled. Filled Date 03 / 23 / 2018 Last Medication intake:  Today

## 2016-04-30 NOTE — Progress Notes (Signed)
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 days.  Step 3: After 7 (seven) days of taking 2 (two) tablets at bedtime, increase it to 3 (three) tablets at bedtime. Stay on this dose x another 7 (seven) days.  Step 4: After 7 (seven) days of taking 3 (three) tablet at bedtime, begin taking 1 (one) tablet at noon with lunch. Stay on this dose x another 7 (seven) days.  Step 5: After 7 (seven) days of taking 3 (three) tablet at bedtime, and 1 (one) tablet at noon, then begin taking 1 (one) tablet in the afternoon with  dinner. Stay on this dose x another 7 (seven) days.  Step 6: After 7 (seven) days of taking 3 (three) tablet at bedtime, 1 (one) tablet at noon, and 1 (one) tablet in the afternoon, then begin taking 1 (one) tablet in the morning with breakfast. Stay on this dose x another 7 (seven) days. At this point you should be taking the medicine 4 (four) times a day, or about every 6 (six) hours. This daily regimen of taking the medicine 4 (four) times a day, will be maintained  from now on. You should not take any doses any sooner than every 6 (six) hours.  Step 7: After 7 (seven) days of taking 3 (three) tablet at bedtime, 1 (one) tablet at noon, 1 (one) tablet in the afternoon, and 1 (one) tablet in the morning, begin taking 2 (two) tablets at noon with lunch. Stay on this dose x another 7 (seven) days.   Step 8: After 7 (seven) days of taking 3 (three) tablet at bedtime, 2 (two) tablets at noon, 1 (one) tablet in the afternoon, and 1 (one) tablet in the morning, begin taking 2 (two) tablets in the afternoon with dinner. Stay on this dose x another 7 (seven) days.   Step 9: After 7 (seven) days of taking 3 (three) tablet at bedtime, 2 (two) tablets at noon, 2 (two) tablets in the afternoon, and 1 (one) tablet in the morning, begin taking 2 (two) tablets in the morning with breakfast. Stay on this dose x another 7 (seven) days. At this point you should be taking the medicine 4 (four) times a day, or about every 6 (six) hours. This daily regimen of taking the medicine 4 (four) times a day, will be maintained from now on. You should not take any doses any sooner than every 6 (six) hours.  Step 10: After 7 (seven) days of taking 3 (three) tablet at bedtime, 2 (two) tablets at noon, 2 (two) tablets in the afternoon, and 2 (two) tablets in the morning, begin taking 3 (three) tablets at noon with lunch. Stay on this dose x another 7 (seven) days.   Step 11: After 7 (seven) days of taking 3 (three) tablet at bedtime, 3 (three) tablets at noon, 2 (two) tablets in the afternoon, and 2 (two) tablets in the morning, begin taking 3 (three) tablets in the afternoon with dinner. Stay on this dose x another 7 (seven) days.   Step 12: After 7 (seven) days of taking 3 (three) tablet at bedtime, 3 (three) tablets at noon, 3 (three) tablets in the afternoon, and 2 (two) tablet in the morning, begin taking 3 (three) tablets in the morning with breakfast. Stay on this dose x another 7 (seven)  days. At this point you should be taking the medicine 4 (four) times a day, or about every 6 (six) hours. This daily regimen of taking the medicine 4 (four) times a day, will be maintained from now on.   Endpoint: Once you have reached the maximum dose you can tolerate without side-effects, contact your physician so as to evaluate the results of the regimen.   Questions: Feel free to contact us for any questions or problems at [phone]

## 2016-04-30 NOTE — Patient Instructions (Addendum)
Gabapentin Titration  Medication used: Gabapentin (Generic Name) or Neurontin (Brand Name) 300 mg tablets/capsules  Reasons to stop increasing the dose:  Reason 1: You get good relief of symptoms, in which case there is no need to increase the daily dose any further.    Reason 2: You develop some side effects, such as sleeping all of the time, difficulty concentrating, or becoming disoriented, in which case you need to go down on the dose, to the prior level, where you were not experiencing any side effects. Stay on that dose longer, to allow more time for your body to get use it, before attempting to increase it again.   Reasons to stop increasing the dose: Reason 1: You get good relief of symptoms, in which case there is no need to increase the daily dose any further.  Reason 2: You develop some side effects, such as sleeping all of the time, difficulty concentrating, or becoming disoriented, in which case you need to go down on the dose, to the prior level, where you were not experiencing any side effects. Stay on that dose longer, to allow more time for your body to get use it, before attempting to increase it again.  Steps to increase medication: Step 1: Start by taking 1 (one) tablet at bedtime x 7 (seven) days.  Step 2: After 7 (seven) days of taking 1 (one) tablet at bedtime, increase it to 2 (two) tablets at bedtime. Stay on this dose x 7 (seven) days.  Step 3: After 7 (seven) days of taking 2 (two) tablets at bedtime, increase it to 3 (three) tablets at bedtime. Stay on this dose x another 7 (seven) days.  Step 4: After 7 (seven) days of taking 3 (three) tablet at bedtime, begin taking 1 (one) tablet at noon with lunch. Stay on this dose x another 7 (seven) days.  Step 5: After 7 (seven) days of taking 3 (three) tablet at bedtime, and 1 (one) tablet at noon, then begin taking 1 (one) tablet in the afternoon with dinner. Stay on this dose x another 7 (seven) days.  Step 6: After 7  (seven) days of taking 3 (three) tablet at bedtime, 1 (one) tablet at noon, and 1 (one) tablet in the afternoon, then begin taking 1 (one) tablet in the morning with breakfast. Stay on this dose x another 7 (seven) days. At this point you should be taking the medicine 4 (four) times a day, or about every 6 (six) hours. This daily regimen of taking the medicine 4 (four) times a day, will be maintained from now on. You should not take any doses any sooner than every 6 (six) hours.  Step 7: After 7 (seven) days of taking 3 (three) tablet at bedtime, 1 (one) tablet at noon, 1 (one) tablet in the afternoon, and 1 (one) tablet in the morning, begin taking 2 (two) tablets at noon with lunch. Stay on this dose x another 7 (seven) days.   Step 8: After 7 (seven) days of taking 3 (three) tablet at bedtime, 2 (two) tablets at noon, 1 (one) tablet in the afternoon, and 1 (one) tablet in the morning, begin taking 2 (two) tablets in the afternoon with dinner. Stay on this dose x another 7 (seven) days.   Step 9: After 7 (seven) days of taking 3 (three) tablet at bedtime, 2 (two) tablets at noon, 2 (two) tablets in the afternoon, and 1 (one) tablet in the morning, begin taking 2 (two) tablets in the morning  with breakfast. Stay on this dose x another 7 (seven) days. At this point you should be taking the medicine 4 (four) times a day, or about every 6 (six) hours. This daily regimen of taking the medicine 4 (four) times a day, will be maintained from now on. You should not take any doses any sooner than every 6 (six) hours.  Step 10: After 7 (seven) days of taking 3 (three) tablet at bedtime, 2 (two) tablets at noon, 2 (two) tablets in the afternoon, and 2 (two) tablets in the morning, begin taking 3 (three) tablets at noon with lunch. Stay on this dose x another 7 (seven) days.   Step 11: After 7 (seven) days of taking 3 (three) tablet at bedtime, 3 (three) tablets at noon, 2 (two) tablets in the afternoon, and 2 (two)  tablets in the morning, begin taking 3 (three) tablets in the afternoon with dinner. Stay on this dose x another 7 (seven) days.   Step 12: After 7 (seven) days of taking 3 (three) tablet at bedtime, 3 (three) tablets at noon, 3 (three) tablets in the afternoon, and 2 (two) tablet in the morning, begin taking 3 (three) tablets in the morning with breakfast. Stay on this dose x another 7 (seven) days. At this point you should be taking the medicine 4 (four) times a day, or about every 6 (six) hours. This daily regimen of taking the medicine 4 (four) times a day, will be maintained from now on.   Endpoint: Once you have reached the maximum dose you can tolerate without side-effects, contact your physician so as to evaluate the results of the regimen.   Questions: Feel free to contact us for any questions or problems at [phone] RISKS AND COMPLICATIONS  What are the risk, side effects and possible complications? Generally speaking, most procedures are safe.  However, with any procedure there are risks, side effects, and the possibility of complications.  The risks and complications are dependent upon the sites that are lesioned, or the type of nerve block to be performed.  The closer the procedure is to the spine, the more serious the risks are.  Great care is taken when placing the radio frequency needles, block needles or lesioning probes, but sometimes complications can occur. 1. Infection: Any time there is an injection through the skin, there is a risk of infection.  This is why sterile conditions are used for these blocks.  There are four possible types of infection. 1. Localized skin infection. 2. Central Nervous System Infection-This can be in the form of Meningitis, which can be deadly. 3. Epidural Infections-This can be in the form of an epidural abscess, which can cause pressure inside of the spine, causing compression of the spinal cord with subsequent paralysis. This would require  an emergency surgery to decompress, and there are no guarantees that the patient would recover from the paralysis. 4. Discitis-This is an infection of the intervertebral discs.  It occurs in about 1% of discography procedures.  It is difficult to treat and it may lead to surgery.        2. Pain: the needles have to go through skin and soft tissues, will cause soreness.       3. Damage to internal structures:  The nerves to be lesioned may be near blood vessels or    other nerves which can be potentially damaged.       4. Bleeding: Bleeding is more common if the patient is taking blood  thinners such as  aspirin, Coumadin, Ticiid, Plavix, etc., or if he/she have some genetic predisposition  such as hemophilia. Bleeding into the spinal canal can cause compression of the spinal  cord with subsequent paralysis.  This would require an emergency surgery to  decompress and there are no guarantees that the patient would recover from the  paralysis.       5. Pneumothorax:  Puncturing of a lung is a possibility, every time a needle is introduced in  the area of the chest or upper back.  Pneumothorax refers to free air around the  collapsed lung(s), inside of the thoracic cavity (chest cavity).  Another two possible  complications related to a similar event would include: Hemothorax and Chylothorax.   These are variations of the Pneumothorax, where instead of air around the collapsed  lung(s), you may have blood or chyle, respectively.       6. Spinal headaches: They may occur with any procedures in the area of the spine.       7. Persistent CSF (Cerebro-Spinal Fluid) leakage: This is a rare problem, but may occur  with prolonged intrathecal or epidural catheters either due to the formation of a fistulous  track or a dural tear.       8. Nerve damage: By working so close to the spinal cord, there is always a possibility of  nerve damage, which could be as serious as a permanent spinal cord injury with  paralysis.        9. Death:  Although rare, severe deadly allergic reactions known as "Anaphylactic  reaction" can occur to any of the medications used.      10. Worsening of the symptoms:  We can always make thing worse.  What are the chances of something like this happening? Chances of any of this occuring are extremely low.  By statistics, you have more of a chance of getting killed in a motor vehicle accident: while driving to the hospital than any of the above occurring .  Nevertheless, you should be aware that they are possibilities.  In general, it is similar to taking a shower.  Everybody knows that you can slip, hit your head and get killed.  Does that mean that you should not shower again?  Nevertheless always keep in mind that statistics do not mean anything if you happen to be on the wrong side of them.  Even if a procedure has a 1 (one) in a 1,000,000 (million) chance of going wrong, it you happen to be that one..Also, keep in mind that by statistics, you have more of a chance of having something go wrong when taking medications.  Who should not have this procedure? If you are on a blood thinning medication (e.g. Coumadin, Plavix, see list of "Blood Thinners"), or if you have an active infection going on, you should not have the procedure.  If you are taking any blood thinners, please inform your physician.  How should I prepare for this procedure?  Do not eat or drink anything at least six hours prior to the procedure.  Bring a driver with you .  It cannot be a taxi.  Come accompanied by an adult that can drive you back, and that is strong enough to help you if your legs get weak or numb from the local anesthetic.  Take all of your medicines the morning of the procedure with just enough water to swallow them.  If you have diabetes, make sure that you are  scheduled to have your procedure done first thing in the morning, whenever possible.  If you have diabetes, take only half of your insulin dose  and notify our nurse that you have done so as soon as you arrive at the clinic.  If you are diabetic, but only take blood sugar pills (oral hypoglycemic), then do not take them on the morning of your procedure.  You may take them after you have had the procedure.  Do not take aspirin or any aspirin-containing medications, at least eleven (11) days prior to the procedure.  They may prolong bleeding.  Wear loose fitting clothing that may be easy to take off and that you would not mind if it got stained with Betadine or blood.  Do not wear any jewelry or perfume  Remove any nail coloring.  It will interfere with some of our monitoring equipment.  NOTE: Remember that this is not meant to be interpreted as a complete list of all possible complications.  Unforeseen problems may occur.  BLOOD THINNERS The following drugs contain aspirin or other products, which can cause increased bleeding during surgery and should not be taken for 2 weeks prior to and 1 week after surgery.  If you should need take something for relief of minor pain, you may take acetaminophen which is found in Tylenol,m Datril, Anacin-3 and Panadol. It is not blood thinner. The products listed below are.  Do not take any of the products listed below in addition to any listed on your instruction sheet.  A.P.C or A.P.C with Codeine Codeine Phosphate Capsules #3 Ibuprofen Ridaura  ABC compound Congesprin Imuran rimadil  Advil Cope Indocin Robaxisal  Alka-Seltzer Effervescent Pain Reliever and Antacid Coricidin or Coricidin-D  Indomethacin Rufen  Alka-Seltzer plus Cold Medicine Cosprin Ketoprofen S-A-C Tablets  Anacin Analgesic Tablets or Capsules Coumadin Korlgesic Salflex  Anacin Extra Strength Analgesic tablets or capsules CP-2 Tablets Lanoril Salicylate  Anaprox Cuprimine Capsules Levenox Salocol  Anexsia-D Dalteparin Magan Salsalate  Anodynos Darvon compound Magnesium Salicylate Sine-off  Ansaid Dasin Capsules Magsal Sodium  Salicylate  Anturane Depen Capsules Marnal Soma  APF Arthritis pain formula Dewitt's Pills Measurin Stanback  Argesic Dia-Gesic Meclofenamic Sulfinpyrazone  Arthritis Bayer Timed Release Aspirin Diclofenac Meclomen Sulindac  Arthritis pain formula Anacin Dicumarol Medipren Supac  Analgesic (Safety coated) Arthralgen Diffunasal Mefanamic Suprofen  Arthritis Strength Bufferin Dihydrocodeine Mepro Compound Suprol  Arthropan liquid Dopirydamole Methcarbomol with Aspirin Synalgos  ASA tablets/Enseals Disalcid Micrainin Tagament  Ascriptin Doan's Midol Talwin  Ascriptin A/D Dolene Mobidin Tanderil  Ascriptin Extra Strength Dolobid Moblgesic Ticlid  Ascriptin with Codeine Doloprin or Doloprin with Codeine Momentum Tolectin  Asperbuf Duoprin Mono-gesic Trendar  Aspergum Duradyne Motrin or Motrin IB Triminicin  Aspirin plain, buffered or enteric coated Durasal Myochrisine Trigesic  Aspirin Suppositories Easprin Nalfon Trillsate  Aspirin with Codeine Ecotrin Regular or Extra Strength Naprosyn Uracel  Atromid-S Efficin Naproxen Ursinus  Auranofin Capsules Elmiron Neocylate Vanquish  Axotal Emagrin Norgesic Verin  Azathioprine Empirin or Empirin with Codeine Normiflo Vitamin E  Azolid Emprazil Nuprin Voltaren  Bayer Aspirin plain, buffered or children's or timed BC Tablets or powders Encaprin Orgaran Warfarin Sodium  Buff-a-Comp Enoxaparin Orudis Zorpin  Buff-a-Comp with Codeine Equegesic Os-Cal-Gesic   Buffaprin Excedrin plain, buffered or Extra Strength Oxalid   Bufferin Arthritis Strength Feldene Oxphenbutazone   Bufferin plain or Extra Strength Feldene Capsules Oxycodone with Aspirin   Bufferin with Codeine Fenoprofen Fenoprofen Pabalate or Pabalate-SF   Buffets II Flogesic Panagesic   Buffinol plain or Extra Strength  Florinal or Florinal with Codeine Panwarfarin   Buf-Tabs Flurbiprofen Penicillamine   Butalbital Compound Four-way cold tablets Penicillin   Butazolidin Fragmin Pepto-Bismol    Carbenicillin Geminisyn Percodan   Carna Arthritis Reliever Geopen Persantine   Carprofen Gold's salt Persistin   Chloramphenicol Goody's Phenylbutazone   Chloromycetin Haltrain Piroxlcam   Clmetidine heparin Plaquenil   Cllnoril Hyco-pap Ponstel   Clofibrate Hydroxy chloroquine Propoxyphen         Before stopping any of these medications, be sure to consult the physician who ordered them.  Some, such as Coumadin (Warfarin) are ordered to prevent or treat serious conditions such as "deep thrombosis", "pumonary embolisms", and other heart problems.  The amount of time that you may need off of the medication may also vary with the medication and the reason for which you were taking it.  If you are taking any of these medications, please make sure you notify your pain physician before you undergo any procedures.         Radiofrequency Lesioning Radiofrequency lesioning is a procedure that is performed to relieve pain. The procedure is often used for back, neck, or arm pain. Radiofrequency lesioning involves the use of a machine that creates radio waves to make heat. During the procedure, the heat is applied to the nerve that carries the pain signal. The heat damages the nerve and interferes with the pain signal. Pain relief usually starts about 2 weeks after the procedure and lasts for 6 months to 1 year. Tell a health care provider about:  Any allergies you have.  All medicines you are taking, including vitamins, herbs, eye drops, creams, and over-the-counter medicines.  Any problems you or family members have had with anesthetic medicines.  Any blood disorders you have.  Any surgeries you have had.  Any medical conditions you have.  Whether you are pregnant or may be pregnant. What are the risks? Generally, this is a safe procedure. However, problems may occur, including:  Pain or soreness at the injection site.  Infection at the injection site.  Damage to nerves or blood  vessels. What happens before the procedure?  Ask your health care provider about:  Changing or stopping your regular medicines. This is especially important if you are taking diabetes medicines or blood thinners.  Taking medicines such as aspirin and ibuprofen. These medicines can thin your blood. Do not take these medicines before your procedure if your health care provider instructs you not to.  Follow instructions from your health care provider about eating or drinking restrictions.  Plan to have someone take you home after the procedure.  If you go home right after the procedure, plan to have someone with you for 24 hours. What happens during the procedure?  You will be given one or more of the following:  A medicine to help you relax (sedative).  A medicine to numb the area (local anesthetic).  You will be awake during the procedure. You will need to be able to talk with the health care provider during the procedure.  With the help of a type of X-ray (fluoroscopy), the health care provider will insert a radiofrequency needle into the area to be treated.  Next, a wire that carries the radio waves (electrode) will be put through the radiofrequency needle. An electrical pulse will be sent through the electrode to verify the correct nerve. You will feel a tingling sensation, and you may have muscle twitching.  Then, the tissue that is around the needle tip will  be heated by an electric current that is passed using the radiofrequency machine. This will numb the nerves.  A bandage (dressing) will be put on the insertion area after the procedure is done. The procedure may vary among health care providers and hospitals. What happens after the procedure?  Your blood pressure, heart rate, breathing rate, and blood oxygen level will be monitored often until the medicines you were given have worn off.  Return to your normal activities as directed by your health care provider. This  information is not intended to replace advice given to you by your health care provider. Make sure you discuss any questions you have with your health care provider. Document Released: 09/12/2010 Document Revised: 06/22/2015 Document Reviewed: 02/21/2014 [employer] Interactive Patient Education  2017 [employer].

## 2016-05-13 ENCOUNTER — Ambulatory Visit: Payer: Medicare Other

## 2016-05-15 DIAGNOSIS — J0100 Acute maxillary sinusitis, unspecified: Secondary | ICD-10-CM | POA: Diagnosis not present

## 2016-05-15 DIAGNOSIS — R350 Frequency of micturition: Secondary | ICD-10-CM | POA: Diagnosis not present

## 2016-05-15 DIAGNOSIS — Z6828 Body mass index (BMI) 28.0-28.9, adult: Secondary | ICD-10-CM | POA: Diagnosis not present

## 2016-05-15 DIAGNOSIS — N390 Urinary tract infection, site not specified: Secondary | ICD-10-CM | POA: Diagnosis not present

## 2016-05-27 DIAGNOSIS — G4700 Insomnia, unspecified: Secondary | ICD-10-CM | POA: Diagnosis not present

## 2016-05-27 DIAGNOSIS — R350 Frequency of micturition: Secondary | ICD-10-CM | POA: Diagnosis not present

## 2016-05-27 DIAGNOSIS — E663 Overweight: Secondary | ICD-10-CM | POA: Diagnosis not present

## 2016-05-27 DIAGNOSIS — R002 Palpitations: Secondary | ICD-10-CM | POA: Diagnosis not present

## 2016-05-27 DIAGNOSIS — E782 Mixed hyperlipidemia: Secondary | ICD-10-CM | POA: Diagnosis not present

## 2016-05-27 DIAGNOSIS — R609 Edema, unspecified: Secondary | ICD-10-CM | POA: Diagnosis not present

## 2016-05-27 DIAGNOSIS — I10 Essential (primary) hypertension: Secondary | ICD-10-CM | POA: Diagnosis not present

## 2016-05-27 DIAGNOSIS — K219 Gastro-esophageal reflux disease without esophagitis: Secondary | ICD-10-CM | POA: Diagnosis not present

## 2016-05-27 DIAGNOSIS — M8580 Other specified disorders of bone density and structure, unspecified site: Secondary | ICD-10-CM | POA: Diagnosis not present

## 2016-05-27 DIAGNOSIS — Z6829 Body mass index (BMI) 29.0-29.9, adult: Secondary | ICD-10-CM | POA: Diagnosis not present

## 2016-05-27 DIAGNOSIS — G2581 Restless legs syndrome: Secondary | ICD-10-CM | POA: Diagnosis not present

## 2016-05-29 ENCOUNTER — Ambulatory Visit
Admission: RE | Admit: 2016-05-29 | Discharge: 2016-05-29 | Disposition: A | Discharge status: Home / Self Care | Payer: Medicare Other | Source: Ambulatory Visit | Attending: Family Medicine | Admitting: Family Medicine

## 2016-05-29 DIAGNOSIS — Z1231 Encounter for screening mammogram for malignant neoplasm of breast: Secondary | ICD-10-CM | POA: Insufficient documentation

## 2016-06-06 ENCOUNTER — Ambulatory Visit
Admission: RE | Admit: 2016-06-06 | Discharge: 2016-06-06 | Disposition: A | Discharge status: Home / Self Care | Payer: Medicare Other | Source: Ambulatory Visit | Attending: Pain Medicine | Admitting: Pain Medicine

## 2016-06-06 ENCOUNTER — Encounter: Payer: Self-pay | Admitting: Pain Medicine

## 2016-06-06 ENCOUNTER — Ambulatory Visit (HOSPITAL_BASED_OUTPATIENT_CLINIC_OR_DEPARTMENT_OTHER): Payer: Medicare Other | Admitting: Pain Medicine

## 2016-06-06 VITALS — BP 155/71 | HR 50 | Temp 98.2°F | Resp 20 | Ht 61.0 in | Wt 150.0 lb

## 2016-06-06 DIAGNOSIS — M545 Low back pain, unspecified: Secondary | ICD-10-CM

## 2016-06-06 DIAGNOSIS — Z885 Allergy status to narcotic agent status: Secondary | ICD-10-CM | POA: Diagnosis not present

## 2016-06-06 DIAGNOSIS — G8929 Other chronic pain: Secondary | ICD-10-CM | POA: Diagnosis not present

## 2016-06-06 DIAGNOSIS — M47817 Spondylosis without myelopathy or radiculopathy, lumbosacral region: Secondary | ICD-10-CM

## 2016-06-06 DIAGNOSIS — M533 Sacrococcygeal disorders, not elsewhere classified: Secondary | ICD-10-CM | POA: Diagnosis not present

## 2016-06-06 DIAGNOSIS — M47816 Spondylosis without myelopathy or radiculopathy, lumbar region: Secondary | ICD-10-CM

## 2016-06-06 DIAGNOSIS — Z880 Allergy status to penicillin: Secondary | ICD-10-CM | POA: Insufficient documentation

## 2016-06-06 DIAGNOSIS — M4696 Unspecified inflammatory spondylopathy, lumbar region: Secondary | ICD-10-CM | POA: Insufficient documentation

## 2016-06-06 DIAGNOSIS — M5386 Other specified dorsopathies, lumbar region: Secondary | ICD-10-CM | POA: Diagnosis not present

## 2016-06-06 DIAGNOSIS — G8918 Other acute postprocedural pain: Secondary | ICD-10-CM

## 2016-06-06 MED ORDER — ROPIVACAINE HCL 2 MG/ML IJ SOLN: 9.0000 mL | Freq: Once | INTRAMUSCULAR | Status: DC

## 2016-06-06 MED ORDER — TRIAMCINOLONE ACETONIDE 40 MG/ML IJ SUSP
40.0000 mg | Freq: Once | INTRAMUSCULAR | Status: AC
  Administered 2016-06-06: 40 mg

## 2016-06-06 MED ORDER — TRIAMCINOLONE ACETONIDE 40 MG/ML IJ SUSP
INTRAMUSCULAR | Status: AC
  Filled 2016-06-06: qty 1

## 2016-06-06 MED ORDER — LIDOCAINE HCL (PF) 1 % IJ SOLN: 10.0000 mL | Freq: Once | INTRAMUSCULAR | Status: DC

## 2016-06-06 MED ORDER — FENTANYL CITRATE (PF) 100 MCG/2ML IJ SOLN
INTRAMUSCULAR | Status: AC
  Filled 2016-06-06: qty 2

## 2016-06-06 MED ORDER — LACTATED RINGERS IV SOLN
1000.0000 mL | Freq: Once | INTRAVENOUS | Status: AC
  Administered 2016-06-06: 1000 mL via INTRAVENOUS

## 2016-06-06 MED ORDER — MIDAZOLAM HCL 5 MG/5ML IJ SOLN: 1.0000 mg | INTRAMUSCULAR | Status: DC | PRN

## 2016-06-06 MED ORDER — FENTANYL CITRATE (PF) 100 MCG/2ML IJ SOLN
25.0000 ug | INTRAMUSCULAR | Status: DC | PRN
  Administered 2016-06-06: 100 ug via INTRAVENOUS

## 2016-06-06 MED ORDER — ROPIVACAINE HCL 2 MG/ML IJ SOLN
INTRAMUSCULAR | Status: AC
  Filled 2016-06-06: qty 10

## 2016-06-06 MED ORDER — HYDROCODONE-ACETAMINOPHEN 5-325 MG PO TABS: 1.0000 | ORAL_TABLET | Freq: Three times a day (TID) | ORAL | 0 refills | Status: DC | PRN

## 2016-06-06 NOTE — Patient Instructions (Addendum)
Post-Procedure instructions Instructions:  Apply ice: Fill a plastic sandwich bag with crushed ice. Cover it with a small towel and apply to injection site. Apply for 15 minutes then remove x 15 minutes. Repeat sequence on day of procedure, until you go to bed. The purpose is to minimize swelling and discomfort after procedure.  Apply heat: Apply heat to procedure site starting the day following the procedure. The purpose is to treat any soreness and discomfort from the procedure.  Food intake: Start with clear liquids (like water) and advance to regular food, as tolerated.   Physical activities: Keep activities to a minimum for the first 8 hours after the procedure.   Driving: If you have received any sedation, you are not allowed to drive for 24 hours after your procedure.  Blood thinner: Restart your blood thinner 6 hours after your procedure. (Only for those taking blood thinners)  Insulin: As soon as you can eat, you may resume your normal dosing schedule. (Only for those taking insulin)  Infection prevention: Keep procedure site clean and dry.  Post-procedure Pain Diary: Extremely important that this be done correctly and accurately. Recorded information will be used to determine the next step in treatment.  Pain evaluated is that of treated area only. Do not include pain from an untreated area.  Complete every hour, on the hour, for the initial 8 hours. Set an alarm to help you do this part accurately.  Do not go to sleep and have it completed later. It will not be accurate.  Follow-up appointment: Keep your follow-up appointment after the procedure. Usually 2 weeks for most procedures. (6 weeks in the case of radiofrequency.) Bring you pain diary.  Expect:  From numbing medicine (AKA: Local Anesthetics): Numbness or decrease in pain.  Onset: Full effect within 15 minutes of injected.  Duration: It will depend on the type of local anesthetic used. On the average, 1 to 8  hours.   From steroids: Decrease in swelling or inflammation. Once inflammation is improved, relief of the pain will follow.  Onset of benefits: Depends on the amount of swelling present. The more swelling, the longer it will take for the benefits to be seen.   Duration: Steroids will stay in the system x 2 weeks. Duration of benefits will depend on multiple posibilities including persistent irritating factors.  From procedure: Some discomfort is to be expected once the numbing medicine wears off. This should be minimal if ice and heat are applied as instructed. Call if:  You experience numbness and weakness that gets worse with time, as opposed to wearing off.  New onset bowel or bladder incontinence. (Spinal procedures only)  Emergency Numbers:  Durning business hours (Monday - Thursday, 8:00 AM - 4:00 PM) (Friday, 9:00 AM - 12:00 Noon): [phone]  After hours: [phone] _____________________________________________________________________________________________  ____________________________________________________________________________________________  Medication Rules  Applies to: All patients receiving prescriptions (written or electronic).  Pharmacy of record: Pharmacy where electronic prescriptions will be sent. If written prescriptions are taken to a different pharmacy, please inform the nursing staff. The pharmacy listed in the electronic medical record should be the one where you would like electronic prescriptions to be sent.  Prescription refills: Only during scheduled appointments. Applies to both, written and electronic prescriptions.  NOTE: The following applies primarily to controlled substances (Opioid Pain Medications)  Patient's responsibilities: 1. Pain Pills: Bring all pain pills to every appointment (except for procedure appointments). 2. Pill Bottles: Bring pills in original pharmacy bottle. Always bring newest   bottle. Bring bottle, even if  empty. 3. Medication refills: You are responsible for knowing and keeping track of what medications you need refilled. The day before your appointment, write a list of all prescriptions that need to be refilled. Bring that list to your appointment and give it to the admitting nurse. Prescriptions will be written only during appointments. If you forget a medication, it will not be "Called in", "Faxed", or "electronically sent". You will need to get another appointment to get these prescribed. 4. Prescription Accuracy: You are responsible for carefully inspecting your prescriptions before leaving our office. Have the discharge nurse carefully go over each prescription with you, before taking them home. Make sure that your name is accurately spelled, that your address is correct. Check the name and dose of your medication to make sure it is accurate. Check the number of pills, and the written instructions to make sure they are clear and accurate. Make sure that you are given enough medication to last until your next medication refill appointment. 5. Taking Medication: Take medication as prescribed. Never take more pills than instructed. Never take medication more frequently than prescribed. Taking less pills or less frequently is permitted and encouraged, when it comes to controlled substances (written prescriptions).  6. Inform other Doctors: Always inform, all of your healthcare providers, of all the medications you take. 7. Pain Medication from other Providers: You are not allowed to accept any additional pain medication from any other Doctor or Healthcare provider. There are two exceptions to this rule. (see below) In the event that you require additional pain medication, you are responsible for notifying us, as stated below. 8. Medication Agreement: You are responsible for carefully reading and following our Medication Agreement. This must be signed before receiving any prescriptions from our practice.  Safely store a copy of your signed Agreement. Violations to the Agreement will result in no further prescriptions. (Additional copies of our Medication Agreement are available upon request.) 9. Laws, Rules, & Regulations: All patients are expected to follow all Federal and [employer], Rules, [employer]. Ignorance of the Laws does not constitute a valid excuse.  Exceptions: There are only two exceptions to the rule of not receiving pain medications from other Healthcare Providers. 1. Exception #1 (Emergencies): In the event of an emergency (i.e.: accident requiring emergency care), you are allowed to receive additional pain medication. However, you are responsible for: As soon as you are able, call our office [phone], at any time of the day or night, and leave a message stating your name, the date and nature of the emergency, and the name and dose of the medication prescribed. In the event that your call is answered by a member of our staff, make sure to document and save the date, time, and the name of the person that took your information.  2. Exception #2 (Planned Surgery): In the event that you are scheduled by another doctor or dentist to have any type of surgery or procedure, you are allowed (for a period no longer than 30 days), to receive additional pain medication, for the acute post-op pain. However, in this case, you are responsible for picking up a copy of our "Post-op Pain Management for Surgeons" handout, and giving it to your surgeon or dentist. This document is available at our office, and does not require an appointment to obtain it. Simply go to our office during business hours (Monday-Thursday from 8:00 AM to 4:00 PM) (Friday 8:00 AM to 12:00 Noon)  or if you have a scheduled appointment with [place], prior to your surgery, and ask for it by name. In addition, you will need to provide [place] with your name, name of your surgeon, type of surgery, and date of procedure or  surgery.  _____________________________________________________________________________________________Pain Management Discharge Instructions  General Discharge Instructions :  If you need to reach your doctor call: Monday-Friday 8:00 am - 4:00 pm at [phone] or toll free [phone].  After clinic hours [phone] to have operator reach doctor.  Bring all of your medication bottles to all your appointments in the pain clinic.  To cancel or reschedule your appointment with Pain Management please remember to call 24 hours in advance to avoid a fee.  Refer to the educational materials which you have been given on: General Risks, I had my Procedure. Discharge Instructions, Post Sedation.  Post Procedure Instructions:  The drugs you were given will stay in your system until tomorrow, so for the next 24 hours you should not drive, make any legal decisions or drink any alcoholic beverages.  You may eat anything you prefer, but it is better to start with liquids then soups and crackers, and gradually work up to solid foods.  Please notify your doctor immediately if you have any unusual bleeding, trouble breathing or pain that is not related to your normal pain.  Depending on the type of procedure that was done, some parts of your body may feel week and/or numb.  This usually clears up by tonight or the next day.  Walk with the use of an assistive device or accompanied by an adult for the 24 hours.  You may use ice on the affected area for the first 24 hours.  Put ice in a Ziploc bag and cover with a towel and place against area 15 minutes on 15 minutes off.  You may switch to heat after 24 hours.

## 2016-06-06 NOTE — Progress Notes (Signed)
Safety precautions to be maintained throughout the outpatient stay will include: orient to surroundings, keep bed in low position, maintain call bell within reach at all times, provide assistance with transfer out of bed and ambulation.  

## 2016-06-06 NOTE — Progress Notes (Signed)
Patient's Name: [name]  MRN: [id number]  Referring Provider: Randel Books, FNP  DOB: November 02, 1945  PCP: Randel Books, FNP  DOS: 06/06/2016  Note by: Kathlen Brunswick. Dossie Arbour, MD  Service setting: Ambulatory outpatient  Location: [facility] (AMB) Pain Management Facility  Visit type: Procedure  Specialty: Interventional Pain Management  Patient type: Established   Primary Reason for Visit: Interventional Pain Management Treatment. CC: Back Pain (low)  Procedure:  Anesthesia, Analgesia, Anxiolysis:  Type: Palliative Medial Branch Facet Radiofrequency Ablation Region: Lumbar Level: L2, L3, L4, L5, & S1 Medial Branch Level(s) Laterality: Right-Sided  Type: Local Anesthesia with Moderate (Conscious) Sedation Local Anesthetic: Lidocaine 1% Route: Intravenous (IV) IV Access: Secured Sedation: Meaningful verbal contact was maintained at all times during the procedure  Indication(s): Analgesia and Anxiety  Indications: 1. Lumbar facet syndrome (Location of Primary Source of Pain) (Bilateral) (L>R)   2. Lumbar spondylosis   3. Chronic low back pain (Location of Primary Source of Pain) (Bilateral) (L>R)   4. Chronic sacroiliac joint pain (Left)   5. Acute postoperative pain    [name] has either failed to respond, was unable to tolerate, or simply did not get enough benefit from other more conservative therapies including, but not limited to: 1. Over-the-counter medications 2. Anti-inflammatory medications 3. Muscle relaxants 4. Membrane stabilizers 5. Opioids 6. Physical therapy 7. Modalities (Heat, ice, etc.) 8. Invasive techniques such as nerve blocks. [name] has attained more than 50% relief of the pain from a series of diagnostic injections conducted in separate occasions.  Pain Score: Pre-procedure: 0-No pain/10 Post-procedure: 0-No pain/10  Pre-op Assessment:  Previous date of service: 04/30/16 Service provided: Med Refill [name] is a 71 y.o.  (year old), female patient, seen today for interventional treatment. She  has a past surgical history that includes Vaginal hysterectomy (1975); Incontinence surgery (2001); Oophorectomy (1978); Elbow Arthroplasty (1990); Cholecystectomy (1989); Rectocele repair (2009); back implant (2015); Mastectomy partial / lumpectomy (Bilateral, 1978 ); and Augmentation mammaplasty (Bilateral, 1978). Her primarily concern today is the Back Pain (low)  Initial Vital Signs: Blood pressure (!) 163/75, pulse (!) 50, temperature 98.2 F (36.8 C), resp. rate 18, height 5\' 1"  (1.549 m), weight 150 lb (68 kg), SpO2 99 %. BMI: 28.34 kg/m  Risk Assessment: Allergies: Reviewed. She is allergic to versed [midazolam]; morphine; niacin and related; and penicillins.  Allergy Precautions: No Versed used. Coagulopathies: Reviewed. None identified.  Blood-thinner therapy: None at this time Active Infection(s): Reviewed. None identified. [name] is afebrile  Site Confirmation: [name] was asked to confirm the procedure and laterality before marking the site Procedure checklist: Completed Consent: Before the procedure and under the influence of no sedative(s), amnesic(s), or anxiolytics, the patient was informed of the treatment options, risks and possible complications. To fulfill our ethical and legal obligations, as recommended by the American Medical Association's Code of Ethics, I have informed the patient of my clinical impression; the nature and purpose of the treatment or procedure; the risks, benefits, and possible complications of the intervention; the alternatives, including doing nothing; the risk(s) and benefit(s) of the alternative treatment(s) or procedure(s); and the risk(s) and benefit(s) of doing nothing. The patient was provided information about the general risks and possible complications associated with the procedure. These may include, but are not limited to: failure to achieve desired goals,  infection, bleeding, organ or nerve damage, allergic reactions, paralysis, and death. In addition, the patient was informed of those risks and complications associated to Spine-related procedures, such  as failure to decrease pain; infection (i.e.: Meningitis, epidural or intraspinal abscess); bleeding (i.e.: epidural hematoma, subarachnoid hemorrhage, or any other type of intraspinal or peri-dural bleeding); organ or nerve damage (i.e.: Any type of peripheral nerve, nerve root, or spinal cord injury) with subsequent damage to sensory, motor, and/or autonomic systems, resulting in permanent pain, numbness, and/or weakness of one or several areas of the body; allergic reactions; (i.e.: anaphylactic reaction); and/or death. Furthermore, the patient was informed of those risks and complications associated with the medications. These include, but are not limited to: allergic reactions (i.e.: anaphylactic or anaphylactoid reaction(s)); adrenal axis suppression; blood sugar elevation that in diabetics may result in ketoacidosis or comma; water retention that in patients with history of congestive heart failure may result in shortness of breath, pulmonary edema, and decompensation with resultant heart failure; weight gain; swelling or edema; medication-induced neural toxicity; particulate matter embolism and blood vessel occlusion with resultant organ, and/or nervous system infarction; and/or aseptic necrosis of one or more joints. Finally, the patient was informed that Medicine is not an exact science; therefore, there is also the possibility of unforeseen or unpredictable risks and/or possible complications that may result in a catastrophic outcome. The patient indicated having understood very clearly. We have given the patient no guarantees and we have made no promises. Enough time was given to the patient to ask questions, all of which were answered to the patient's satisfaction. [name] has indicated that she  wanted to continue with the procedure. Attestation: I, the ordering provider, attest that I have discussed with the patient the benefits, risks, side-effects, alternatives, likelihood of achieving goals, and potential problems during recovery for the procedure that I have provided informed consent. Date: 06/06/2016; Time: 8:18 AM  Pre-Procedure Preparation:  Monitoring: As per clinic protocol. Respiration, ETCO2, SpO2, BP, heart rate and rhythm monitor placed and checked for adequate function Safety Precautions: Patient was assessed for positional comfort and pressure points before starting the procedure. Time-out: I initiated and conducted the "Time-out" before starting the procedure, as per protocol. The patient was asked to participate by confirming the accuracy of the "Time Out" information. Verification of the correct person, site, and procedure were performed and confirmed by me, the nursing staff, and the patient. "Time-out" conducted as per Joint Commission's Universal Protocol (UP.01.01.01). "Time-out" Date & Time: 06/06/2016; 0846 hrs.  Description of Procedure Process:   Position: Prone Target Area: For Lumbar Facet blocks, the target is the groove formed by the junction of the transverse process and superior articular process. For the L5 dorsal ramus, the target is the notch between superior articular process and sacral ala. For the S1 dorsal ramus, the target is the superior and lateral edge of the posterior S1 Sacral foramen. Approach: Paraspinal approach. Area Prepped: Entire Posterior Lumbosacral Region Prepping solution: Hibiclens (4.0% Chlorhexidine gluconate solution) Safety Precautions: Aspiration looking for blood return was conducted prior to all injections. At no point did we inject any substances, as a needle was being advanced. No attempts were made at seeking any paresthesias. Safe injection practices and needle disposal techniques used. Medications properly checked for  expiration dates. SDV (single dose vial) medications used. Description of the Procedure: Protocol guidelines were followed. The patient was placed in position over the fluoroscopy table. The target area was identified and the area prepped in the usual manner. Skin desensitized using vapocoolant spray. Skin & deeper tissues infiltrated with local anesthetic. Appropriate amount of time allowed to pass for local anesthetics to take effect. Radiofrequency  needles were introduced to the area of the medial branch at the junction of the superior articular process and transverse process using fluoroscopy. Using the [employer], sensory stimulation using 50 Hz was used to locate & identify the nerve, making sure that the needle was positioned such that there was no sensory stimulation below 0.3 V or above 0.7 V. Stimulation using 2 Hz was used to evaluate the motor component. Care was taken not to lesion any nerves that demonstrated motor stimulation of the lower extremities at an output of less than 2.5 times that of the sensory threshold, or a maximum of 2.0 V. Once satisfactory placement of the needles was achieved, the above solution was slowly injected after negative aspiration. After waiting for at least 2 minutes, the ablation was performed at 80 degrees C for 60 seconds.The needles were then removed and the area cleansed, making sure to leave some of the prepping solution back to take advantage of its long term bactericidal properties. Vitals:   06/06/16 0919 06/06/16 0931 06/06/16 0941 06/06/16 0951  BP: (!) 163/69 (!) 144/84 (!) 153/96 (!) 155/71  Pulse:      Resp: 14 11 17 20   Temp:      SpO2: 95% 97% 98% 100%  Weight:      Height:        Start Time: 0849 hrs. End Time: 0918 hrs. Materials & Medications:  Needle(s) Type: Teflon-coated, curved tip, Radiofrequency needle(s) Gauge: 22G Length: 10cm Medication(s): We administered lactated ringers, fentaNYL, triamcinolone  acetonide, and ropivacaine (PF) 2 mg/mL (0.2%). Please see chart orders for dosing details.  Imaging Guidance (Spinal):  Type of Imaging Technique: Fluoroscopy Guidance (Spinal) Indication(s): Assistance in needle guidance and placement for procedures requiring needle placement in or near specific anatomical locations not easily accessible without such assistance. Exposure Time: Please see nurses notes. Contrast: None used. Fluoroscopic Guidance: I was personally present during the use of fluoroscopy. "Tunnel Vision Technique" used to obtain the best possible view of the target area. Parallax error corrected before commencing the procedure. "Direction-depth-direction" technique used to introduce the needle under continuous pulsed fluoroscopy. Once target was reached, antero-posterior, oblique, and lateral fluoroscopic projection used confirm needle placement in all planes. Images permanently stored in EMR. Interpretation: No contrast injected. I personally interpreted the imaging intraoperatively. Adequate needle placement confirmed in multiple planes. Permanent images saved into the patient's record.  Antibiotic Prophylaxis:  Indication(s): None identified Antibiotic given: None  Post-operative Assessment:  EBL: None Complications: No immediate post-treatment complications observed by team, or reported by patient. Note: The patient tolerated the entire procedure well. A repeat set of vitals were taken after the procedure and the patient was kept under observation following institutional policy, for this type of procedure. Post-procedural neurological assessment was performed, showing return to baseline, prior to discharge. The patient was provided with post-procedure discharge instructions, including a section on how to identify potential problems. Should any problems arise concerning this procedure, the patient was given instructions to immediately contact us, at any time, without hesitation. In  any case, we plan to contact the patient by telephone for a follow-up status report regarding this interventional procedure. Comments:  No additional relevant information.  Plan of Care  Disposition: Discharge home  Discharge Date & Time: 06/06/2016; 0959 hrs.  Physician-requested Follow-up:  Return for post-RF eval, (6 wks), w/ MD, in addition, keep scheduled appointment, Med-Mgmt, w/ NP.  Future Appointments Date Time Provider [place]  07/18/2016 9:00 AM Milinda Pointer, MD [facility] None  07/29/2016 8:00 AM Vevelyn Francois, NP [facility] None   Medications ordered for procedure: Meds ordered this encounter  Medications  . lactated ringers infusion 1,000 mL  . DISCONTD: midazolam (VERSED) 5 MG/5ML injection 1-2 mg    Make sure Flumazenil is available in the pyxis when using this medication. If oversedation occurs, administer 0.2 mg IV over 15 sec. If after 45 sec no response, administer 0.2 mg again over 1 min; may repeat at 1 min intervals; not to exceed 4 doses (1 mg)  . fentaNYL (SUBLIMAZE) injection 25-50 mcg    Make sure Narcan is available in the pyxis when using this medication. In the event of respiratory depression (RR< 8/min): Titrate NARCAN (naloxone) in increments of 0.1 to 0.2 mg IV at 2-3 minute intervals, until desired degree of reversal.  . triamcinolone acetonide (KENALOG-40) injection 40 mg  . lidocaine (PF) (XYLOCAINE) 1 % injection 10 mL  . ropivacaine (PF) 2 mg/mL (0.2%) (NAROPIN) injection 9 mL  . HYDROcodone-acetaminophen (NORCO/VICODIN) 5-325 MG tablet    Sig: Take 1 tablet by mouth every 8 (eight) hours as needed for severe pain.    Dispense:  30 tablet    Refill:  0    For acute post-operative pain. Not to be refilled. To last until:   Medications administered: We administered lactated ringers, fentaNYL, triamcinolone acetonide, and ropivacaine (PF) 2 mg/mL (0.2%).  See the medical record for exact dosing, route, and time of  administration.  Lab-work, Procedure(s), & Referral(s) Ordered: Orders Placed This Encounter  Procedures  . Radiofrequency,Lumbar  . DG C-Arm 1-60 Min-No Report  . Informed Consent Details: Transcribe to consent form and obtain patient signature  . Provider attestation of informed consent for procedure/surgical case  . Verify informed consent  . Discharge instructions  . Follow-up   Imaging Ordered: Results for orders placed in visit on 11/23/15  DG C-Arm 1-60 Min-No Report   Narrative CLINICAL DATA: Assistance in needle guidance and placement for procedures  requiring needle placement in or near specific anatomical locations not  easily accessible without such assistance.   C-ARM 1-60 MINUTES  Fluoroscopy was utilized by the requesting physician.  No radiographic  interpretation.     New Prescriptions   HYDROCODONE-ACETAMINOPHEN (NORCO/VICODIN) 5-325 MG TABLET    Take 1 tablet by mouth every 8 (eight) hours as needed for severe pain.   Primary Care Physician: Randel Books, FNP Location: [facility] Note by: Kathlen Brunswick Dossie Arbour, M.D, DABA, DABAPM, DABPM, DABIPP, FIPP Date: 06/06/2016; Time: 10:21 AM  Disclaimer:  Medicine is not an exact science. The only guarantee in medicine is that nothing is guaranteed. It is important to note that the decision to proceed with this intervention was based on the information collected from the patient. The Data and conclusions were drawn from the patient's questionnaire, the interview, and the physical examination. Because the information was provided in large part by the patient, it cannot be guaranteed that it has not been purposely or unconsciously manipulated. Every effort has been made to obtain as much relevant data as possible for this evaluation. It is important to note that the conclusions that lead to this procedure are derived in large part from the available data. Always take into account that the  treatment will also be dependent on availability of resources and existing treatment guidelines, considered by other Pain Management Practitioners as being common knowledge and practice, at the time of the intervention. For Medico-Legal purposes, it is also important to  point out that variation in procedural techniques and pharmacological choices are the acceptable norm. The indications, contraindications, technique, and results of the above procedure should only be interpreted and judged by a Board-Certified Interventional Pain Specialist with extensive familiarity and expertise in the same exact procedure and technique.  Instructions provided at this appointment: Patient Instructions  Post-Procedure instructions Instructions:  Apply ice: Fill a plastic sandwich bag with crushed ice. Cover it with a small towel and apply to injection site. Apply for 15 minutes then remove x 15 minutes. Repeat sequence on day of procedure, until you go to bed. The purpose is to minimize swelling and discomfort after procedure.  Apply heat: Apply heat to procedure site starting the day following the procedure. The purpose is to treat any soreness and discomfort from the procedure.  Food intake: Start with clear liquids (like water) and advance to regular food, as tolerated.   Physical activities: Keep activities to a minimum for the first 8 hours after the procedure.   Driving: If you have received any sedation, you are not allowed to drive for 24 hours after your procedure.  Blood thinner: Restart your blood thinner 6 hours after your procedure. (Only for those taking blood thinners)  Insulin: As soon as you can eat, you may resume your normal dosing schedule. (Only for those taking insulin)  Infection prevention: Keep procedure site clean and dry.  Post-procedure Pain Diary: Extremely important that this be done correctly and accurately. Recorded information will be used to determine the next step in  treatment.  Pain evaluated is that of treated area only. Do not include pain from an untreated area.  Complete every hour, on the hour, for the initial 8 hours. Set an alarm to help you do this part accurately.  Do not go to sleep and have it completed later. It will not be accurate.  Follow-up appointment: Keep your follow-up appointment after the procedure. Usually 2 weeks for most procedures. (6 weeks in the case of radiofrequency.) Bring you pain diary.  Expect:  From numbing medicine (AKA: Local Anesthetics): Numbness or decrease in pain.  Onset: Full effect within 15 minutes of injected.  Duration: It will depend on the type of local anesthetic used. On the average, 1 to 8 hours.   From steroids: Decrease in swelling or inflammation. Once inflammation is improved, relief of the pain will follow.  Onset of benefits: Depends on the amount of swelling present. The more swelling, the longer it will take for the benefits to be seen.   Duration: Steroids will stay in the system x 2 weeks. Duration of benefits will depend on multiple posibilities including persistent irritating factors.  From procedure: Some discomfort is to be expected once the numbing medicine wears off. This should be minimal if ice and heat are applied as instructed. Call if:  You experience numbness and weakness that gets worse with time, as opposed to wearing off.  New onset bowel or bladder incontinence. (Spinal procedures only)  Emergency Numbers:  [facility] business hours (Monday - Thursday, 8:00 AM - 4:00 PM) (Friday, 9:00 AM - 12:00 Noon): [phone]  After hours: [phone] _____________________________________________________________________________________________  ____________________________________________________________________________________________  Medication Rules  Applies to: All patients receiving prescriptions (written or electronic).  Pharmacy of record: Pharmacy where  electronic prescriptions will be sent. If written prescriptions are taken to a different pharmacy, please inform the nursing staff. The pharmacy listed in the electronic medical record should be the one where you would like electronic prescriptions  to be sent.  Prescription refills: Only during scheduled appointments. Applies to both, written and electronic prescriptions.  NOTE: The following applies primarily to controlled substances (Opioid Pain Medications)  Patient's responsibilities: 1. Pain Pills: Bring all pain pills to every appointment (except for procedure appointments). 2. Pill Bottles: Bring pills in original pharmacy bottle. Always bring newest bottle. Bring bottle, even if empty. 3. Medication refills: You are responsible for knowing and keeping track of what medications you need refilled. The day before your appointment, write a list of all prescriptions that need to be refilled. Bring that list to your appointment and give it to the admitting nurse. Prescriptions will be written only during appointments. If you forget a medication, it will not be "Called in", "Faxed", or "electronically sent". You will need to get another appointment to get these prescribed. 4. Prescription Accuracy: You are responsible for carefully inspecting your prescriptions before leaving our office. Have the discharge nurse carefully go over each prescription with you, before taking them home. Make sure that your name is accurately spelled, that your address is correct. Check the name and dose of your medication to make sure it is accurate. Check the number of pills, and the written instructions to make sure they are clear and accurate. Make sure that you are given enough medication to last until your next medication refill appointment. 5. Taking Medication: Take medication as prescribed. Never take more pills than instructed. Never take medication more frequently than prescribed. Taking less pills or less  frequently is permitted and encouraged, when it comes to controlled substances (written prescriptions).  6. Inform other Doctors: Always inform, all of your healthcare providers, of all the medications you take. 7. Pain Medication from other Providers: You are not allowed to accept any additional pain medication from any other Doctor or Healthcare provider. There are two exceptions to this rule. (see below) In the event that you require additional pain medication, you are responsible for notifying us, as stated below. 8. Medication Agreement: You are responsible for carefully reading and following our Medication Agreement. This must be signed before receiving any prescriptions from our practice. Safely store a copy of your signed Agreement. Violations to the Agreement will result in no further prescriptions. (Additional copies of our Medication Agreement are available upon request.) 9. Laws, Rules, & Regulations: All patients are expected to follow all Federal and [employer], Rules, [employer]. Ignorance of the Laws does not constitute a valid excuse.  Exceptions: There are only two exceptions to the rule of not receiving pain medications from other Healthcare Providers. 1. Exception #1 (Emergencies): In the event of an emergency (i.e.: accident requiring emergency care), you are allowed to receive additional pain medication. However, you are responsible for: As soon as you are able, call our office [phone], at any time of the day or night, and leave a message stating your name, the date and nature of the emergency, and the name and dose of the medication prescribed. In the event that your call is answered by a member of our staff, make sure to document and save the date, time, and the name of the person that took your information.  2. Exception #2 (Planned Surgery): In the event that you are scheduled by another doctor or dentist to have any type of surgery or procedure, you are allowed  (for a period no longer than 30 days), to receive additional pain medication, for the acute post-op pain. However, in this case, you are responsible  for picking up a copy of our "Post-op Pain Management for Surgeons" handout, and giving it to your surgeon or dentist. This document is available at our office, and does not require an appointment to obtain it. Simply go to our office during business hours (Monday-Thursday from 8:00 AM to 4:00 PM) (Friday 8:00 AM to 12:00 Noon) or if you have a scheduled appointment with [place], prior to your surgery, and ask for it by name. In addition, you will need to provide [place] with your name, name of your surgeon, type of surgery, and date of procedure or surgery.  _____________________________________________________________________________________________Pain Management Discharge Instructions  General Discharge Instructions :  If you need to reach your doctor call: Monday-Friday 8:00 am - 4:00 pm at [phone] or toll free [phone].  After clinic hours [phone] to have operator reach doctor.  Bring all of your medication bottles to all your appointments in the pain clinic.  To cancel or reschedule your appointment with Pain Management please remember to call 24 hours in advance to avoid a fee.  Refer to the educational materials which you have been given on: General Risks, I had my Procedure. Discharge Instructions, Post Sedation.  Post Procedure Instructions:  The drugs you were given will stay in your system until tomorrow, so for the next 24 hours you should not drive, make any legal decisions or drink any alcoholic beverages.  You may eat anything you prefer, but it is better to start with liquids then soups and crackers, and gradually work up to solid foods.  Please notify your doctor immediately if you have any unusual bleeding, trouble breathing or pain that is not related to your normal pain.  Depending on the type of procedure that was done,  some parts of your body may feel week and/or numb.  This usually clears up by tonight or the next day.  Walk with the use of an assistive device or accompanied by an adult for the 24 hours.  You may use ice on the affected area for the first 24 hours.  Put ice in a Ziploc bag and cover with a towel and place against area 15 minutes on 15 minutes off.  You may switch to heat after 24 hours.

## 2016-06-07 ENCOUNTER — Telehealth: Payer: Self-pay

## 2016-06-07 NOTE — Telephone Encounter (Signed)
Post procedure phone call.  Patient states she is doing OK 

## 2016-06-11 ENCOUNTER — Other Ambulatory Visit: Payer: Self-pay | Admitting: Pain Medicine

## 2016-06-11 DIAGNOSIS — M792 Neuralgia and neuritis, unspecified: Secondary | ICD-10-CM

## 2016-06-11 DIAGNOSIS — M797 Fibromyalgia: Secondary | ICD-10-CM

## 2016-06-12 ENCOUNTER — Telehealth: Payer: Self-pay | Admitting: *Deleted

## 2016-06-12 ENCOUNTER — Other Ambulatory Visit: Payer: Self-pay | Admitting: Pain Medicine

## 2016-06-12 DIAGNOSIS — M792 Neuralgia and neuritis, unspecified: Secondary | ICD-10-CM

## 2016-06-12 DIAGNOSIS — M797 Fibromyalgia: Secondary | ICD-10-CM

## 2016-06-12 NOTE — Telephone Encounter (Signed)
Spoke with patient and clarified that she has her last prescription of gabapentin was for 120 capsules.  Patient states she will call pharmacy and get it straightened out.

## 2016-06-18 DIAGNOSIS — M549 Dorsalgia, unspecified: Secondary | ICD-10-CM | POA: Diagnosis not present

## 2016-06-18 DIAGNOSIS — Z7982 Long term (current) use of aspirin: Secondary | ICD-10-CM | POA: Diagnosis not present

## 2016-06-18 DIAGNOSIS — Z885 Allergy status to narcotic agent status: Secondary | ICD-10-CM | POA: Diagnosis not present

## 2016-06-18 DIAGNOSIS — S81811A Laceration without foreign body, right lower leg, initial encounter: Secondary | ICD-10-CM | POA: Diagnosis not present

## 2016-06-18 DIAGNOSIS — G8929 Other chronic pain: Secondary | ICD-10-CM | POA: Diagnosis not present

## 2016-06-18 DIAGNOSIS — Z79899 Other long term (current) drug therapy: Secondary | ICD-10-CM | POA: Diagnosis not present

## 2016-06-18 DIAGNOSIS — Z7951 Long term (current) use of inhaled steroids: Secondary | ICD-10-CM | POA: Diagnosis not present

## 2016-06-18 DIAGNOSIS — Z880 Allergy status to penicillin: Secondary | ICD-10-CM | POA: Diagnosis not present

## 2016-06-18 DIAGNOSIS — Z7983 Long term (current) use of bisphosphonates: Secondary | ICD-10-CM | POA: Diagnosis not present

## 2016-06-18 DIAGNOSIS — Z87891 Personal history of nicotine dependence: Secondary | ICD-10-CM | POA: Diagnosis not present

## 2016-06-18 DIAGNOSIS — J45909 Unspecified asthma, uncomplicated: Secondary | ICD-10-CM | POA: Diagnosis not present

## 2016-06-18 DIAGNOSIS — I10 Essential (primary) hypertension: Secondary | ICD-10-CM | POA: Diagnosis not present

## 2016-06-18 DIAGNOSIS — Z79891 Long term (current) use of opiate analgesic: Secondary | ICD-10-CM | POA: Diagnosis not present

## 2016-06-18 DIAGNOSIS — Z886 Allergy status to analgesic agent status: Secondary | ICD-10-CM | POA: Diagnosis not present

## 2016-06-18 DIAGNOSIS — Z79890 Hormone replacement therapy (postmenopausal): Secondary | ICD-10-CM | POA: Diagnosis not present

## 2016-06-18 DIAGNOSIS — Z23 Encounter for immunization: Secondary | ICD-10-CM | POA: Diagnosis not present

## 2016-06-26 DIAGNOSIS — H81399 Other peripheral vertigo, unspecified ear: Secondary | ICD-10-CM | POA: Diagnosis not present

## 2016-06-26 DIAGNOSIS — H109 Unspecified conjunctivitis: Secondary | ICD-10-CM | POA: Diagnosis not present

## 2016-06-26 DIAGNOSIS — F419 Anxiety disorder, unspecified: Secondary | ICD-10-CM | POA: Diagnosis not present

## 2016-06-28 DIAGNOSIS — I10 Essential (primary) hypertension: Secondary | ICD-10-CM | POA: Diagnosis not present

## 2016-06-28 DIAGNOSIS — Z886 Allergy status to analgesic agent status: Secondary | ICD-10-CM | POA: Diagnosis not present

## 2016-06-28 DIAGNOSIS — Z87891 Personal history of nicotine dependence: Secondary | ICD-10-CM | POA: Diagnosis not present

## 2016-06-28 DIAGNOSIS — Z880 Allergy status to penicillin: Secondary | ICD-10-CM | POA: Diagnosis not present

## 2016-06-28 DIAGNOSIS — Z4802 Encounter for removal of sutures: Secondary | ICD-10-CM | POA: Diagnosis not present

## 2016-06-28 DIAGNOSIS — Z885 Allergy status to narcotic agent status: Secondary | ICD-10-CM | POA: Diagnosis not present

## 2016-06-28 DIAGNOSIS — Z7983 Long term (current) use of bisphosphonates: Secondary | ICD-10-CM | POA: Diagnosis not present

## 2016-07-18 ENCOUNTER — Other Ambulatory Visit: Payer: Self-pay | Admitting: Pain Medicine

## 2016-07-18 ENCOUNTER — Ambulatory Visit: Payer: Medicare Other | Attending: Pain Medicine | Admitting: Pain Medicine

## 2016-07-18 ENCOUNTER — Encounter: Payer: Self-pay | Admitting: Pain Medicine

## 2016-07-18 VITALS — BP 112/63 | HR 61 | Temp 97.6°F | Resp 16 | Ht 61.0 in | Wt 149.0 lb

## 2016-07-18 DIAGNOSIS — K589 Irritable bowel syndrome without diarrhea: Secondary | ICD-10-CM | POA: Diagnosis not present

## 2016-07-18 DIAGNOSIS — Z9889 Other specified postprocedural states: Secondary | ICD-10-CM | POA: Insufficient documentation

## 2016-07-18 DIAGNOSIS — G2581 Restless legs syndrome: Secondary | ICD-10-CM | POA: Insufficient documentation

## 2016-07-18 DIAGNOSIS — E785 Hyperlipidemia, unspecified: Secondary | ICD-10-CM | POA: Insufficient documentation

## 2016-07-18 DIAGNOSIS — K219 Gastro-esophageal reflux disease without esophagitis: Secondary | ICD-10-CM | POA: Diagnosis not present

## 2016-07-18 DIAGNOSIS — F1190 Opioid use, unspecified, uncomplicated: Secondary | ICD-10-CM | POA: Diagnosis not present

## 2016-07-18 DIAGNOSIS — Z888 Allergy status to other drugs, medicaments and biological substances status: Secondary | ICD-10-CM | POA: Insufficient documentation

## 2016-07-18 DIAGNOSIS — Z885 Allergy status to narcotic agent status: Secondary | ICD-10-CM | POA: Diagnosis not present

## 2016-07-18 DIAGNOSIS — Z9013 Acquired absence of bilateral breasts and nipples: Secondary | ICD-10-CM | POA: Diagnosis not present

## 2016-07-18 DIAGNOSIS — Z969 Presence of functional implant, unspecified: Secondary | ICD-10-CM | POA: Diagnosis not present

## 2016-07-18 DIAGNOSIS — G8929 Other chronic pain: Secondary | ICD-10-CM | POA: Diagnosis not present

## 2016-07-18 DIAGNOSIS — Z9049 Acquired absence of other specified parts of digestive tract: Secondary | ICD-10-CM | POA: Insufficient documentation

## 2016-07-18 DIAGNOSIS — Z880 Allergy status to penicillin: Secondary | ICD-10-CM | POA: Insufficient documentation

## 2016-07-18 DIAGNOSIS — M545 Low back pain, unspecified: Secondary | ICD-10-CM

## 2016-07-18 DIAGNOSIS — Z8249 Family history of ischemic heart disease and other diseases of the circulatory system: Secondary | ICD-10-CM | POA: Insufficient documentation

## 2016-07-18 DIAGNOSIS — Z87440 Personal history of urinary (tract) infections: Secondary | ICD-10-CM | POA: Insufficient documentation

## 2016-07-18 DIAGNOSIS — R209 Unspecified disturbances of skin sensation: Secondary | ICD-10-CM | POA: Diagnosis not present

## 2016-07-18 DIAGNOSIS — M79605 Pain in left leg: Secondary | ICD-10-CM

## 2016-07-18 DIAGNOSIS — Z90710 Acquired absence of both cervix and uterus: Secondary | ICD-10-CM | POA: Insufficient documentation

## 2016-07-18 DIAGNOSIS — G4700 Insomnia, unspecified: Secondary | ICD-10-CM | POA: Insufficient documentation

## 2016-07-18 DIAGNOSIS — Z87891 Personal history of nicotine dependence: Secondary | ICD-10-CM | POA: Diagnosis not present

## 2016-07-18 DIAGNOSIS — Z79891 Long term (current) use of opiate analgesic: Secondary | ICD-10-CM | POA: Diagnosis not present

## 2016-07-18 DIAGNOSIS — J449 Chronic obstructive pulmonary disease, unspecified: Secondary | ICD-10-CM | POA: Insufficient documentation

## 2016-07-18 DIAGNOSIS — G894 Chronic pain syndrome: Secondary | ICD-10-CM

## 2016-07-18 DIAGNOSIS — F411 Generalized anxiety disorder: Secondary | ICD-10-CM | POA: Diagnosis not present

## 2016-07-18 DIAGNOSIS — I10 Essential (primary) hypertension: Secondary | ICD-10-CM | POA: Insufficient documentation

## 2016-07-18 DIAGNOSIS — Z7982 Long term (current) use of aspirin: Secondary | ICD-10-CM | POA: Diagnosis not present

## 2016-07-18 DIAGNOSIS — Z803 Family history of malignant neoplasm of breast: Secondary | ICD-10-CM | POA: Insufficient documentation

## 2016-07-18 DIAGNOSIS — M797 Fibromyalgia: Secondary | ICD-10-CM | POA: Diagnosis not present

## 2016-07-18 DIAGNOSIS — Z8371 Family history of colonic polyps: Secondary | ICD-10-CM | POA: Insufficient documentation

## 2016-07-18 NOTE — Progress Notes (Signed)
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, at any time of the day or night, and leave a message stating your name, the date and nature of the emergency, and the name and dose of the medication prescribed. In the event that your call is answered by a member of our staff, make sure to document and save the date, time, and the name of the person that took your information.  2. Exception #2 (Planned Surgery): In the event that you are scheduled by another doctor or dentist to have any type of surgery or procedure, you are allowed (for a period no longer than 30 days), to receive additional pain medication, for the acute post-op pain. However, in this case, you are responsible for picking up a copy of our "Post-op Pain Management for Surgeons" handout, and giving it to your surgeon or dentist. This document is available at our office, and does not require an appointment to obtain it. Simply go to our office during business hours (Monday-Thursday from 8:00 AM to 4:00  PM) (Friday 8:00 AM to 12:00 Noon) or if you have a scheduled appointment with us, prior to your surgery, and ask for it by name. In addition, you will need to provide us with your name, name of your surgeon, type of surgery, and date of procedure or surgery.  ____________________________________________________________________________________________   

## 2016-07-18 NOTE — Patient Instructions (Signed)
____________________________________________________________________________________________  Appointment Policy Summary  It is our goal and responsibility to provide the medical community with assistance in the evaluation and management of patients with chronic pain. Unfortunately our resources are limited. Because we do not have an unlimited amount of time, or available appointments, we are required to closely monitor and manage their use. The following rules exist to maximize their use:  Patient's responsibilities: 1. Punctuality: You are required to be physically present and registered in our facility at least 30 minutes before your appointment. 2. Tardiness: The cutoff is your appointment time. If you have an appointment scheduled for 10:00 AM and you arrive at 10:01, you will be required to reschedule your appointment.  3. Plan ahead: Always assume that you will encounter traffic on your way in. Plan for it. If you are dependent on a driver, make sure they understand these rules and the need to arrive early. 4. Other appointments and responsibilities: Avoid scheduling any other appointments before or after your pain clinic appointments.  5. Be prepared: Write down everything that you need to discuss with your healthcare provider and give this information to the admitting nurse. Write down the medications that you will need refilled. Bring your pills and bottles (even the empty ones), to all of your appointments, except for those where a procedure is scheduled. 6. No children or pets: Find someone to take care of them. It is not appropriate to bring them in. 7. Scheduling changes: We request "advanced notification" of any changes or cancellations. 8. Advanced notification: Defined as a time period of more than 24 hours prior to the originally scheduled appointment. This allows for the appointment to be offered to other patients. 9. Rescheduling: When a visit is rescheduled, it will require the  cancellation of the original appointment. For this reason they both fall within the category of "Cancellations".  10. Cancellations: They require advanced notification. Any cancellation less than 24 hours before the  appointment will be recorded as a "No Show". 11. No Show: Defined as an unkept appointment where the patient failed to notify or declare to the practice their intention or inability to keep the appointment.  Corrective process for repeat offenders:  1. Tardiness: Three (3) episodes of rescheduling due to late arrivals will be recorded as one (1) "No Show". 2. Cancellation or reschedule: Three (3) cancellations or rescheduling will be recorded as one (1) "No Show". 3. "No Shows": Three (3) "No Shows" within a 12 month period will result in discharge from the practice.  ____________________________________________________________________________________________   ____________________________________________________________________________________________  Medication Rules  Applies to: All patients receiving prescriptions (written or electronic).  Pharmacy of record: Pharmacy where electronic prescriptions will be sent. If written prescriptions are taken to a different pharmacy, please inform the nursing staff. The pharmacy listed in the electronic medical record should be the one where you would like electronic prescriptions to be sent.  Prescription refills: Only during scheduled appointments. Applies to both, written and electronic prescriptions.  NOTE: The following applies primarily to controlled substances (Opioid Pain Medications)  Patient's responsibilities: 1. Pain Pills: Bring all pain pills to every appointment (except for procedure appointments). 2. Pill Bottles: Bring pills in original pharmacy bottle. Always bring newest bottle. Bring bottle, even if empty. 3. Medication refills: You are responsible for knowing and keeping track of what medications you need  refilled. The day before your appointment, write a list of all prescriptions that need to be refilled. Bring that list to your appointment and give it to  the admitting nurse. Prescriptions will be written only during appointments. If you forget a medication, it will not be "Called in", "Faxed", or "electronically sent". You will need to get another appointment to get these prescribed. 4. Prescription Accuracy: You are responsible for carefully inspecting your prescriptions before leaving our office. Have the discharge nurse carefully go over each prescription with you, before taking them home. Make sure that your name is accurately spelled, that your address is correct. Check the name and dose of your medication to make sure it is accurate. Check the number of pills, and the written instructions to make sure they are clear and accurate. Make sure that you are given enough medication to last until your next medication refill appointment. 5. Taking Medication: Take medication as prescribed. Never take more pills than instructed. Never take medication more frequently than prescribed. Taking less pills or less frequently is permitted and encouraged, when it comes to controlled substances (written prescriptions).  6. Inform other Doctors: Always inform, all of your healthcare providers, of all the medications you take. 7. Pain Medication from other Providers: You are not allowed to accept any additional pain medication from any other Doctor or Healthcare provider. There are two exceptions to this rule. (see below) In the event that you require additional pain medication, you are responsible for notifying us, as stated below. 8. Medication Agreement: You are responsible for carefully reading and following our Medication Agreement. This must be signed before receiving any prescriptions from our practice. Safely store a copy of your signed Agreement. Violations to the Agreement will result in no further prescriptions.  (Additional copies of our Medication Agreement are available upon request.) 9. Laws, Rules, & Regulations: All patients are expected to follow all Federal and [employer], Rules, [employer]. Ignorance of the Laws does not constitute a valid excuse.  Exceptions: There are only two exceptions to the rule of not receiving pain medications from other Healthcare Providers. 1. Exception #1 (Emergencies): In the event of an emergency (i.e.: accident requiring emergency care), you are allowed to receive additional pain medication. However, you are responsible for: As soon as you are able, call our office [phone], at any time of the day or night, and leave a message stating your name, the date and nature of the emergency, and the name and dose of the medication prescribed. In the event that your call is answered by a member of our staff, make sure to document and save the date, time, and the name of the person that took your information.  2. Exception #2 (Planned Surgery): In the event that you are scheduled by another doctor or dentist to have any type of surgery or procedure, you are allowed (for a period no longer than 30 days), to receive additional pain medication, for the acute post-op pain. However, in this case, you are responsible for picking up a copy of our "Post-op Pain Management for Surgeons" handout, and giving it to your surgeon or dentist. This document is available at our office, and does not require an appointment to obtain it. Simply go to our office during business hours (Monday-Thursday from 8:00 AM to 4:00 PM) (Friday 8:00 AM to 12:00 Noon) or if you have a scheduled appointment with [place], prior to your surgery, and ask for it by name. In addition, you will need to provide [place] with your name, name of your surgeon, type of surgery, and date of procedure or surgery.  ____________________________________________________________________________________________

## 2016-07-18 NOTE — Progress Notes (Signed)
Safety precautions to be maintained throughout the outpatient stay will include: orient to surroundings, keep bed in low position, maintain call bell within reach at all times, provide assistance with transfer out of bed and ambulation.  

## 2016-07-23 DIAGNOSIS — N390 Urinary tract infection, site not specified: Secondary | ICD-10-CM | POA: Diagnosis not present

## 2016-07-23 LAB — COMPREHENSIVE METABOLIC PANEL
ALBUMIN: 4.2 g/dL (ref 3.6–4.8)
ALK PHOS: 38 IU/L — AB (ref 39–117)
ALT: 29 IU/L (ref 0–32)
AST: 29 IU/L (ref 0–40)
Albumin/Globulin Ratio: 1.9 (ref 1.2–2.2)
BILIRUBIN TOTAL: 0.3 mg/dL (ref 0.0–1.2)
BUN / CREAT RATIO: 23 (ref 12–28)
BUN: 18 mg/dL (ref 8–27)
CHLORIDE: 100 mmol/L (ref 96–106)
CO2: 25 mmol/L (ref 20–29)
Calcium: 9.2 mg/dL (ref 8.7–10.3)
Creatinine, Ser: 0.79 mg/dL (ref 0.57–1.00)
GFR calc Af Amer: 88 mL/min/{1.73_m2} (ref >59)
GFR calc non Af Amer: 77 mL/min/{1.73_m2} (ref >59)
GLOBULIN, TOTAL: 2.2 g/dL (ref 1.5–4.5)
GLUCOSE: 84 mg/dL (ref 65–99)
Potassium: 3.9 mmol/L (ref 3.5–5.2)
SODIUM: 141 mmol/L (ref 134–144)
Total Protein: 6.4 g/dL (ref 6.0–8.5)

## 2016-07-23 LAB — VITAMIN B12: Vitamin B-12: 360 pg/mL (ref 232–1245)

## 2016-07-23 LAB — 25-HYDROXYVITAMIN D LCMS D2+D3: 25-HYDROXY, VITAMIN D: 62 ng/mL

## 2016-07-23 LAB — MAGNESIUM: Magnesium: 2 mg/dL (ref 1.6–2.3)

## 2016-07-23 LAB — C-REACTIVE PROTEIN: CRP: 0.6 mg/L (ref 0.0–4.9)

## 2016-07-23 LAB — SEDIMENTATION RATE: SED RATE: 2 mm/h (ref 0–40)

## 2016-07-23 LAB — 25-HYDROXY VITAMIN D LCMS D2+D3
25-Hydroxy, Vitamin D-2: <1 ng/mL
25-Hydroxy, Vitamin D-3: 62 ng/mL

## 2016-07-24 LAB — TOXASSURE SELECT 13 (MW), URINE

## 2016-07-29 ENCOUNTER — Encounter: Payer: Self-pay | Admitting: Nurse Practitioner

## 2016-07-29 ENCOUNTER — Ambulatory Visit: Payer: Medicare Other | Attending: Nurse Practitioner | Admitting: Nurse Practitioner

## 2016-07-29 ENCOUNTER — Ambulatory Visit: Payer: Medicare Other | Admitting: Pain Medicine

## 2016-07-29 VITALS — BP 138/66 | HR 60 | Temp 97.7°F | Resp 18 | Ht 61.0 in | Wt 148.0 lb

## 2016-07-29 DIAGNOSIS — Z79899 Other long term (current) drug therapy: Secondary | ICD-10-CM | POA: Diagnosis not present

## 2016-07-29 DIAGNOSIS — Z7982 Long term (current) use of aspirin: Secondary | ICD-10-CM | POA: Insufficient documentation

## 2016-07-29 DIAGNOSIS — Z7951 Long term (current) use of inhaled steroids: Secondary | ICD-10-CM | POA: Diagnosis not present

## 2016-07-29 DIAGNOSIS — M533 Sacrococcygeal disorders, not elsewhere classified: Secondary | ICD-10-CM | POA: Diagnosis not present

## 2016-07-29 DIAGNOSIS — I10 Essential (primary) hypertension: Secondary | ICD-10-CM | POA: Diagnosis not present

## 2016-07-29 DIAGNOSIS — Z880 Allergy status to penicillin: Secondary | ICD-10-CM | POA: Diagnosis not present

## 2016-07-29 DIAGNOSIS — M4696 Unspecified inflammatory spondylopathy, lumbar region: Secondary | ICD-10-CM

## 2016-07-29 DIAGNOSIS — M62830 Muscle spasm of back: Secondary | ICD-10-CM | POA: Diagnosis not present

## 2016-07-29 DIAGNOSIS — Z885 Allergy status to narcotic agent status: Secondary | ICD-10-CM | POA: Diagnosis not present

## 2016-07-29 DIAGNOSIS — Z5181 Encounter for therapeutic drug level monitoring: Secondary | ICD-10-CM | POA: Insufficient documentation

## 2016-07-29 DIAGNOSIS — Z79891 Long term (current) use of opiate analgesic: Secondary | ICD-10-CM | POA: Diagnosis not present

## 2016-07-29 DIAGNOSIS — G2581 Restless legs syndrome: Secondary | ICD-10-CM | POA: Insufficient documentation

## 2016-07-29 DIAGNOSIS — M7062 Trochanteric bursitis, left hip: Secondary | ICD-10-CM | POA: Diagnosis not present

## 2016-07-29 DIAGNOSIS — M7918 Myalgia, other site: Secondary | ICD-10-CM

## 2016-07-29 DIAGNOSIS — K589 Irritable bowel syndrome without diarrhea: Secondary | ICD-10-CM | POA: Insufficient documentation

## 2016-07-29 DIAGNOSIS — M48061 Spinal stenosis, lumbar region without neurogenic claudication: Secondary | ICD-10-CM | POA: Insufficient documentation

## 2016-07-29 DIAGNOSIS — M488X6 Other specified spondylopathies, lumbar region: Secondary | ICD-10-CM | POA: Diagnosis not present

## 2016-07-29 DIAGNOSIS — M8580 Other specified disorders of bone density and structure, unspecified site: Secondary | ICD-10-CM | POA: Insufficient documentation

## 2016-07-29 DIAGNOSIS — M47816 Spondylosis without myelopathy or radiculopathy, lumbar region: Secondary | ICD-10-CM

## 2016-07-29 DIAGNOSIS — R5382 Chronic fatigue, unspecified: Secondary | ICD-10-CM | POA: Insufficient documentation

## 2016-07-29 DIAGNOSIS — M961 Postlaminectomy syndrome, not elsewhere classified: Secondary | ICD-10-CM | POA: Diagnosis not present

## 2016-07-29 DIAGNOSIS — M545 Low back pain: Secondary | ICD-10-CM | POA: Diagnosis not present

## 2016-07-29 DIAGNOSIS — M791 Myalgia: Secondary | ICD-10-CM

## 2016-07-29 DIAGNOSIS — Z87891 Personal history of nicotine dependence: Secondary | ICD-10-CM | POA: Diagnosis not present

## 2016-07-29 DIAGNOSIS — J449 Chronic obstructive pulmonary disease, unspecified: Secondary | ICD-10-CM | POA: Insufficient documentation

## 2016-07-29 DIAGNOSIS — E785 Hyperlipidemia, unspecified: Secondary | ICD-10-CM | POA: Insufficient documentation

## 2016-07-29 DIAGNOSIS — R209 Unspecified disturbances of skin sensation: Secondary | ICD-10-CM | POA: Insufficient documentation

## 2016-07-29 DIAGNOSIS — M792 Neuralgia and neuritis, unspecified: Secondary | ICD-10-CM | POA: Diagnosis not present

## 2016-07-29 DIAGNOSIS — Z7983 Long term (current) use of bisphosphonates: Secondary | ICD-10-CM | POA: Diagnosis not present

## 2016-07-29 DIAGNOSIS — G894 Chronic pain syndrome: Secondary | ICD-10-CM | POA: Insufficient documentation

## 2016-07-29 DIAGNOSIS — G8918 Other acute postprocedural pain: Secondary | ICD-10-CM | POA: Diagnosis not present

## 2016-07-29 DIAGNOSIS — K219 Gastro-esophageal reflux disease without esophagitis: Secondary | ICD-10-CM | POA: Insufficient documentation

## 2016-07-29 DIAGNOSIS — E669 Obesity, unspecified: Secondary | ICD-10-CM | POA: Insufficient documentation

## 2016-07-29 DIAGNOSIS — M797 Fibromyalgia: Secondary | ICD-10-CM | POA: Insufficient documentation

## 2016-07-29 DIAGNOSIS — Z6827 Body mass index (BMI) 27.0-27.9, adult: Secondary | ICD-10-CM | POA: Insufficient documentation

## 2016-07-29 DIAGNOSIS — F411 Generalized anxiety disorder: Secondary | ICD-10-CM | POA: Insufficient documentation

## 2016-07-29 DIAGNOSIS — G4700 Insomnia, unspecified: Secondary | ICD-10-CM | POA: Insufficient documentation

## 2016-07-29 MED ORDER — OXYCODONE HCL 5 MG PO TABS: 5.0000 mg | ORAL_TABLET | Freq: Three times a day (TID) | ORAL | 0 refills | Status: DC | PRN

## 2016-07-29 MED ORDER — TIZANIDINE HCL 4 MG PO TABS: 4.0000 mg | ORAL_TABLET | Freq: Three times a day (TID) | ORAL | 2 refills | Status: DC | PRN

## 2016-07-29 MED ORDER — GABAPENTIN 300 MG PO CAPS: 300.0000 mg | ORAL_CAPSULE | Freq: Two times a day (BID) | ORAL | 2 refills | Status: DC

## 2016-07-29 NOTE — Progress Notes (Addendum)
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 administered today: Ms. Rausch had no medications administered during this visit. Lab-work, procedure(s), and/or referral(s): No orders of the defined types were placed in this encounter.  Imaging and/or referral(s): None  Interventional therapies: Planned, scheduled, and/or pending:   Not at this time.   Considering:   Diagnostic bilateral intra-articular hip injection. Bilateral lumbar facet radiofrequency ablation.  Palliative bilateral lumbar facet block. Diagnostic left sacroiliac joint block. Palliative L1-2 vs L4-4 LESI. Palliative bilateral L4-5 TFESI.   Palliative PRN treatment(s):   Diagnostic bilateral intra-articular hip injection. Bilateral lumbar facet radiofrequency ablation.  Palliative bilateral lumbar facet block. Diagnostic left sacroiliac joint block. Palliative L1-2 vs L4-4 LESI. Palliative bilateral L4-5 TFESI.   Provider-requested follow-up: Return in about 3 months (around 10/29/2016) for MedMgmt.  Future Appointments Date Time Provider  [place]  10/28/2016 8:30 AM Vevelyn Francois, NP [facility] None   Primary Care Physician: Randel Books, FNP Location: [facility] Note by: Vevelyn Francois NP Date: 07/29/2016; Time: 10:14 AM  Pain Score Disclaimer: We use the NRS-11 scale. This is a self-reported, subjective measurement of pain severity with only modest accuracy. It is used primarily to identify changes within a particular patient. It must be understood that outpatient pain scales are significantly less accurate that those used for research, where they can be applied under ideal controlled circumstances with minimal exposure to variables. In reality, the score is likely to be a combination of pain intensity and pain affect, where pain affect describes the degree of emotional arousal or changes in  action readiness caused by the sensory experience of pain. Factors such as social and work situation, setting, emotional state, anxiety levels, expectation, and prior pain experience may influence pain perception and show large inter-individual differences that may also be affected by time variables.  Patient instructions provided during this appointment: Patient Instructions   ____________________________________________________________________________________________  Medication Rules  Applies to: All patients receiving prescriptions (written or electronic).  Pharmacy of record: Pharmacy where electronic prescriptions will be sent. If written prescriptions are taken to a different pharmacy, please inform the nursing staff. The pharmacy listed in the electronic medical record should be the one where you would like electronic prescriptions to be sent.  Prescription refills: Only during scheduled appointments. Applies to both, written and electronic prescriptions.  NOTE: The following applies primarily to controlled substances (Opioid* Pain Medications).   Patient's responsibilities: 1. Pain  Pills: Bring all pain pills to every appointment (except for procedure appointments). 2. Pill Bottles: Bring pills in original pharmacy bottle. Always bring newest bottle. Bring bottle, even if empty. 3. Medication refills: You are responsible for knowing and keeping track of what medications you need refilled. The day before your appointment, write a list of all prescriptions that need to be refilled. Bring that list to your appointment and give it to the admitting nurse. Prescriptions will be written only during appointments. If you forget a medication, it will not be "Called in", "Faxed", or "electronically sent". You will need to get another appointment to get these prescribed. 4. Prescription Accuracy: You are responsible for carefully inspecting your prescriptions before leaving our office. Have the discharge nurse carefully go over each prescription with you, before taking them home. Make sure that your name is accurately spelled, that your address is correct. Check the name and dose of your medication to make sure it is accurate. Check the number of pills, and the written instructions to make sure they are clear and accurate. Make sure that you are given enough medication to last until your next medication refill appointment. 5. Taking Medication: Take medication as prescribed. Never take more pills than instructed. Never take medication more frequently than prescribed. Taking less pills or less frequently is permitted and encouraged, when it comes to controlled substances (written prescriptions).  6. Inform other Doctors: Always inform, all of your healthcare providers, of all the medications you take. 7. Pain Medication from other Providers: You are not allowed to accept any additional pain medication from any other Doctor or Healthcare provider. There are two exceptions to this rule. (see below) In the event that you require additional pain medication, you are responsible for notifying us, as stated  below. 8. Medication Agreement: You are responsible for carefully reading and following our Medication Agreement. This must be signed before receiving any prescriptions from our practice. Safely store a copy of your signed Agreement. Violations to the Agreement will result in no further prescriptions. (Additional copies of our Medication Agreement are available upon request.) 9. Laws, Rules, & Regulations: All patients are expected to follow all Federal and [employer], Rules, [employer]. Ignorance of the Laws does not constitute a valid excuse. The use of any illegal substances is prohibited. 10. Adopted CDC guidelines & recommendations: Target dosing levels will be at or below 60 MME/day. Use of benzodiazepines** is not recommended.  Exceptions: There are only two exceptions to the rule of not receiving pain medications from other Healthcare Providers. 1. Exception #1 (Emergencies): In the event of an emergency (i.e.: accident requiring emergency care),  you are allowed to receive additional pain medication. However, you are responsible for: As soon as you are able, call our office [phone], at any time of the day or night, and leave a message stating your name, the date and nature of the emergency, and the name and dose of the medication prescribed. In the event that your call is answered by a member of our staff, make sure to document and save the date, time, and the name of the person that took your information.  2. Exception #2 (Planned Surgery): In the event that you are scheduled by another doctor or dentist to have any type of surgery or procedure, you are allowed (for a period no longer than 30 days), to receive additional pain medication, for the acute post-op pain. However, in this case, you are responsible for picking up a copy of our "Post-op Pain Management for Surgeons" handout, and giving it to your surgeon or dentist. This document is available at our office, and does not  require an appointment to obtain it. Simply go to our office during business hours (Monday-Thursday from 8:00 AM to 4:00 PM) (Friday 8:00 AM to 12:00 Noon) or if you have a scheduled appointment with [place], prior to your surgery, and ask for it by name. In addition, you will need to provide [place] with your name, name of your surgeon, type of surgery, and date of procedure or surgery.  *Opioid medications include: morphine, codeine, oxycodone, oxymorphone, hydrocodone, hydromorphone, meperidine, tramadol, tapentadol, buprenorphine, fentanyl, methadone. **Benzodiazepine medications include: diazepam (Valium), alprazolam (Xanax), clonazepam (Klonopine), lorazepam (Ativan), clorazepate (Tranxene), chlordiazepoxide (Librium), estazolam (Prosom), oxazepam (Serax), temazepam (Restoril), triazolam (Halcion)  ____________________________________________________________________________________________

## 2016-07-29 NOTE — Patient Instructions (Signed)

## 2016-07-29 NOTE — Progress Notes (Signed)
Nursing Pain Medication Assessment:  Safety precautions to be maintained throughout the outpatient stay will include: orient to surroundings, keep bed in low position, maintain call bell within reach at all times, provide assistance with transfer out of bed and ambulation.  Medication Inspection Compliance: Pill count conducted under aseptic conditions, in front of the patient. Neither the pills nor the bottle was removed from the patient's sight at any time. Once count was completed pills were immediately returned to the patient in their original bottle.  Medication: Oxycodone IR Pill/Patch Count: 47 of 90 pills remain.  States she has 10 at home. Pill/Patch Appearance: Markings consistent with prescribed medication Bottle Appearance: Standard pharmacy container. Clearly labeled. Filled Date: 06 /14 / 2018 Last Medication intake:  Today

## 2016-08-05 DIAGNOSIS — R1030 Lower abdominal pain, unspecified: Secondary | ICD-10-CM | POA: Diagnosis not present

## 2016-08-05 DIAGNOSIS — F419 Anxiety disorder, unspecified: Secondary | ICD-10-CM | POA: Diagnosis not present

## 2016-08-06 ENCOUNTER — Telehealth: Payer: Self-pay | Admitting: Pain Medicine

## 2016-08-06 DIAGNOSIS — G894 Chronic pain syndrome: Secondary | ICD-10-CM

## 2016-08-06 MED ORDER — OXYCODONE HCL 5 MG PO TABS: 5.0000 mg | ORAL_TABLET | Freq: Three times a day (TID) | ORAL | 0 refills | Status: DC | PRN

## 2016-08-06 NOTE — Telephone Encounter (Signed)
Spoke with Corky Downs, ask patient to return all her prescriptions. Attempted to call patient, messages left on both numbers listed.

## 2016-08-06 NOTE — Telephone Encounter (Signed)
Patient returned her old prescriptions, new ones given with new dates.

## 2016-08-06 NOTE — Telephone Encounter (Signed)
Patient lvmail stating she thought she had a script to be filled for July but she does not. She received scripts for Aug., Sept., and Oct. When she saw C. Edison Pace last visit but does not have script for July and will be out of meds 08-09-16. Please call patient to discuss

## 2016-09-23 DIAGNOSIS — I10 Essential (primary) hypertension: Secondary | ICD-10-CM | POA: Diagnosis not present

## 2016-09-23 DIAGNOSIS — J45901 Unspecified asthma with (acute) exacerbation: Secondary | ICD-10-CM | POA: Diagnosis not present

## 2016-09-23 DIAGNOSIS — G2581 Restless legs syndrome: Secondary | ICD-10-CM | POA: Diagnosis not present

## 2016-09-23 DIAGNOSIS — H81399 Other peripheral vertigo, unspecified ear: Secondary | ICD-10-CM | POA: Diagnosis not present

## 2016-09-23 DIAGNOSIS — E784 Other hyperlipidemia: Secondary | ICD-10-CM | POA: Diagnosis not present

## 2016-09-23 DIAGNOSIS — F419 Anxiety disorder, unspecified: Secondary | ICD-10-CM | POA: Diagnosis not present

## 2016-09-23 DIAGNOSIS — G4700 Insomnia, unspecified: Secondary | ICD-10-CM | POA: Diagnosis not present

## 2016-09-23 DIAGNOSIS — M150 Primary generalized (osteo)arthritis: Secondary | ICD-10-CM | POA: Diagnosis not present

## 2016-10-25 DIAGNOSIS — E039 Hypothyroidism, unspecified: Secondary | ICD-10-CM | POA: Diagnosis not present

## 2016-10-25 DIAGNOSIS — E784 Other hyperlipidemia: Secondary | ICD-10-CM | POA: Diagnosis not present

## 2016-10-25 DIAGNOSIS — I10 Essential (primary) hypertension: Secondary | ICD-10-CM | POA: Diagnosis not present

## 2016-10-28 ENCOUNTER — Encounter: Payer: Self-pay | Admitting: Nurse Practitioner

## 2016-10-28 ENCOUNTER — Ambulatory Visit: Payer: Medicare Other | Attending: Nurse Practitioner | Admitting: Nurse Practitioner

## 2016-10-28 VITALS — BP 134/70 | HR 66 | Temp 98.0°F | Resp 18 | Ht 61.0 in | Wt 147.0 lb

## 2016-10-28 DIAGNOSIS — M8580 Other specified disorders of bone density and structure, unspecified site: Secondary | ICD-10-CM | POA: Insufficient documentation

## 2016-10-28 DIAGNOSIS — I10 Essential (primary) hypertension: Secondary | ICD-10-CM | POA: Insufficient documentation

## 2016-10-28 DIAGNOSIS — M5416 Radiculopathy, lumbar region: Secondary | ICD-10-CM | POA: Diagnosis not present

## 2016-10-28 DIAGNOSIS — M533 Sacrococcygeal disorders, not elsewhere classified: Secondary | ICD-10-CM | POA: Diagnosis not present

## 2016-10-28 DIAGNOSIS — M7062 Trochanteric bursitis, left hip: Secondary | ICD-10-CM | POA: Diagnosis not present

## 2016-10-28 DIAGNOSIS — E669 Obesity, unspecified: Secondary | ICD-10-CM | POA: Insufficient documentation

## 2016-10-28 DIAGNOSIS — M961 Postlaminectomy syndrome, not elsewhere classified: Secondary | ICD-10-CM | POA: Diagnosis not present

## 2016-10-28 DIAGNOSIS — Z79891 Long term (current) use of opiate analgesic: Secondary | ICD-10-CM | POA: Insufficient documentation

## 2016-10-28 DIAGNOSIS — K589 Irritable bowel syndrome without diarrhea: Secondary | ICD-10-CM | POA: Diagnosis not present

## 2016-10-28 DIAGNOSIS — M546 Pain in thoracic spine: Secondary | ICD-10-CM | POA: Diagnosis present

## 2016-10-28 DIAGNOSIS — M48061 Spinal stenosis, lumbar region without neurogenic claudication: Secondary | ICD-10-CM | POA: Diagnosis not present

## 2016-10-28 DIAGNOSIS — G8929 Other chronic pain: Secondary | ICD-10-CM

## 2016-10-28 DIAGNOSIS — G2581 Restless legs syndrome: Secondary | ICD-10-CM | POA: Diagnosis not present

## 2016-10-28 DIAGNOSIS — K219 Gastro-esophageal reflux disease without esophagitis: Secondary | ICD-10-CM | POA: Diagnosis not present

## 2016-10-28 DIAGNOSIS — M4726 Other spondylosis with radiculopathy, lumbar region: Secondary | ICD-10-CM | POA: Insufficient documentation

## 2016-10-28 DIAGNOSIS — R202 Paresthesia of skin: Secondary | ICD-10-CM | POA: Diagnosis not present

## 2016-10-28 DIAGNOSIS — M7918 Myalgia, other site: Secondary | ICD-10-CM | POA: Diagnosis not present

## 2016-10-28 DIAGNOSIS — M797 Fibromyalgia: Secondary | ICD-10-CM

## 2016-10-28 DIAGNOSIS — J449 Chronic obstructive pulmonary disease, unspecified: Secondary | ICD-10-CM | POA: Insufficient documentation

## 2016-10-28 DIAGNOSIS — F411 Generalized anxiety disorder: Secondary | ICD-10-CM | POA: Insufficient documentation

## 2016-10-28 DIAGNOSIS — Z87891 Personal history of nicotine dependence: Secondary | ICD-10-CM | POA: Insufficient documentation

## 2016-10-28 DIAGNOSIS — M545 Low back pain: Secondary | ICD-10-CM | POA: Diagnosis not present

## 2016-10-28 DIAGNOSIS — M62830 Muscle spasm of back: Secondary | ICD-10-CM | POA: Diagnosis not present

## 2016-10-28 DIAGNOSIS — G894 Chronic pain syndrome: Secondary | ICD-10-CM

## 2016-10-28 DIAGNOSIS — E785 Hyperlipidemia, unspecified: Secondary | ICD-10-CM | POA: Insufficient documentation

## 2016-10-28 DIAGNOSIS — M792 Neuralgia and neuritis, unspecified: Secondary | ICD-10-CM

## 2016-10-28 DIAGNOSIS — M47816 Spondylosis without myelopathy or radiculopathy, lumbar region: Secondary | ICD-10-CM | POA: Diagnosis not present

## 2016-10-28 MED ORDER — TIZANIDINE HCL 4 MG PO TABS: 4.0000 mg | ORAL_TABLET | Freq: Three times a day (TID) | ORAL | 2 refills | Status: DC | PRN

## 2016-10-28 MED ORDER — OXYCODONE HCL 5 MG PO TABS: 5.0000 mg | ORAL_TABLET | Freq: Three times a day (TID) | ORAL | 0 refills | Status: DC | PRN

## 2016-10-28 MED ORDER — GABAPENTIN 300 MG PO CAPS: 300.0000 mg | ORAL_CAPSULE | Freq: Two times a day (BID) | ORAL | 2 refills | Status: DC

## 2016-10-28 NOTE — Patient Instructions (Addendum)
____________________________________________________________________________________________  Medication Rules  Applies to: All patients receiving prescriptions (written or electronic).  Pharmacy of record: Pharmacy where electronic prescriptions will be sent. If written prescriptions are taken to a different pharmacy, please inform the nursing staff. The pharmacy listed in the electronic medical record should be the one where you would like electronic prescriptions to be sent.  Prescription refills: Only during scheduled appointments. Applies to both, written and electronic prescriptions.  NOTE: The following applies primarily to controlled substances (Opioid* Pain Medications).   Patient's responsibilities: 1. Pain Pills: Bring all pain pills to every appointment (except for procedure appointments). 2. Pill Bottles: Bring pills in original pharmacy bottle. Always bring newest bottle. Bring bottle, even if empty. 3. Medication refills: You are responsible for knowing and keeping track of what medications you need refilled. The day before your appointment, write a list of all prescriptions that need to be refilled. Bring that list to your appointment and give it to the admitting nurse. Prescriptions will be written only during appointments. If you forget a medication, it will not be "Called in", "Faxed", or "electronically sent". You will need to get another appointment to get these prescribed. 4. Prescription Accuracy: You are responsible for carefully inspecting your prescriptions before leaving our office. Have the discharge nurse carefully go over each prescription with you, before taking them home. Make sure that your name is accurately spelled, that your address is correct. Check the name and dose of your medication to make sure it is accurate. Check the number of pills, and the written instructions to make sure they are clear and accurate. Make sure that you are given enough medication to  last until your next medication refill appointment. 5. Taking Medication: Take medication as prescribed. Never take more pills than instructed. Never take medication more frequently than prescribed. Taking less pills or less frequently is permitted and encouraged, when it comes to controlled substances (written prescriptions).  6. Inform other Doctors: Always inform, all of your healthcare providers, of all the medications you take. 7. Pain Medication from other Providers: You are not allowed to accept any additional pain medication from any other Doctor or Healthcare provider. There are two exceptions to this rule. (see below) In the event that you require additional pain medication, you are responsible for notifying us, as stated below. 8. Medication Agreement: You are responsible for carefully reading and following our Medication Agreement. This must be signed before receiving any prescriptions from our practice. Safely store a copy of your signed Agreement. Violations to the Agreement will result in no further prescriptions. (Additional copies of our Medication Agreement are available upon request.) 9. Laws, Rules, & Regulations: All patients are expected to follow all Federal and State Laws, Statutes, Rules, & Regulations. Ignorance of the Laws does not constitute a valid excuse. The use of any illegal substances is prohibited. 10. Adopted CDC guidelines & recommendations: Target dosing levels will be at or below 60 MME/day. Use of benzodiazepines** is not recommended.  Exceptions: There are only two exceptions to the rule of not receiving pain medications from other Healthcare Providers. 1. Exception #1 (Emergencies): In the event of an emergency (i.e.: accident requiring emergency care), you are allowed to receive additional pain medication. However, you are responsible for: As soon as you are able, call our office [phone], at any time of the day or night, and leave a message stating your  name, the date and nature of the emergency, and the name and dose of the medication   prescribed. In the event that your call is answered by a member of our staff, make sure to document and save the date, time, and the name of the person that took your information.  2. Exception #2 (Planned Surgery): In the event that you are scheduled by another doctor or dentist to have any type of surgery or procedure, you are allowed (for a period no longer than 30 days), to receive additional pain medication, for the acute post-op pain. However, in this case, you are responsible for picking up a copy of our "Post-op Pain Management for Surgeons" handout, and giving it to your surgeon or dentist. This document is available at our office, and does not require an appointment to obtain it. Simply go to our office during business hours (Monday-Thursday from 8:00 AM to 4:00 PM) (Friday 8:00 AM to 12:00 Noon) or if you have a scheduled appointment with us, prior to your surgery, and ask for it by name. In addition, you will need to provide us with your name, name of your surgeon, type of surgery, and date of procedure or surgery.  *Opioid medications include: morphine, codeine, oxycodone, oxymorphone, hydrocodone, hydromorphone, meperidine, tramadol, tapentadol, buprenorphine, fentanyl, methadone. **Benzodiazepine medications include: diazepam (Valium), alprazolam (Xanax), clonazepam (Klonopine), lorazepam (Ativan), clorazepate (Tranxene), chlordiazepoxide (Librium), estazolam (Prosom), oxazepam (Serax), temazepam (Restoril), triazolam (Halcion)  ____________________________________________________________________________________________ GENERAL RISKS AND COMPLICATIONS  What are the risk, side effects and possible complications? Generally speaking, most procedures are safe.  However, with any procedure there are risks, side effects, and the possibility of complications.  The risks and complications are dependent upon the sites  that are lesioned, or the type of nerve block to be performed.  The closer the procedure is to the spine, the more serious the risks are.  Great care is taken when placing the radio frequency needles, block needles or lesioning probes, but sometimes complications can occur. 1. Infection: Any time there is an injection through the skin, there is a risk of infection.  This is why sterile conditions are used for these blocks.  There are four possible types of infection. 1. Localized skin infection. 2. Central Nervous System Infection-This can be in the form of Meningitis, which can be deadly. 3. Epidural Infections-This can be in the form of an epidural abscess, which can cause pressure inside of the spine, causing compression of the spinal cord with subsequent paralysis. This would require an emergency surgery to decompress, and there are no guarantees that the patient would recover from the paralysis. 4. Discitis-This is an infection of the intervertebral discs.  It occurs in about 1% of discography procedures.  It is difficult to treat and it may lead to surgery.        2. Pain: the needles have to go through skin and soft tissues, will cause soreness.       3. Damage to internal structures:  The nerves to be lesioned may be near blood vessels or    other nerves which can be potentially damaged.       4. Bleeding: Bleeding is more common if the patient is taking blood thinners such as  aspirin, Coumadin, Ticiid, Plavix, etc., or if he/she have some genetic predisposition  such as hemophilia. Bleeding into the spinal canal can cause compression of the spinal  cord with subsequent paralysis.  This would require an emergency surgery to  decompress and there are no guarantees that the patient would recover from the  paralysis.       5.   Pneumothorax:  Puncturing of a lung is a possibility, every time a needle is introduced in  the area of the chest or upper back.  Pneumothorax refers to free air around the   collapsed lung(s), inside of the thoracic cavity (chest cavity).  Another two possible  complications related to a similar event would include: Hemothorax and Chylothorax.   These are variations of the Pneumothorax, where instead of air around the collapsed  lung(s), you may have blood or chyle, respectively.       6. Spinal headaches: They may occur with any procedures in the area of the spine.       7. Persistent CSF (Cerebro-Spinal Fluid) leakage: This is a rare problem, but may occur  with prolonged intrathecal or epidural catheters either due to the formation of a fistulous  track or a dural tear.       8. Nerve damage: By working so close to the spinal cord, there is always a possibility of  nerve damage, which could be as serious as a permanent spinal cord injury with  paralysis.       9. Death:  Although rare, severe deadly allergic reactions known as "Anaphylactic  reaction" can occur to any of the medications used.      10. Worsening of the symptoms:  We can always make thing worse.  What are the chances of something like this happening? Chances of any of this occuring are extremely low.  By statistics, you have more of a chance of getting killed in a motor vehicle accident: while driving to the hospital than any of the above occurring .  Nevertheless, you should be aware that they are possibilities.  In general, it is similar to taking a shower.  Everybody knows that you can slip, hit your head and get killed.  Does that mean that you should not shower again?  Nevertheless always keep in mind that statistics do not mean anything if you happen to be on the wrong side of them.  Even if a procedure has a 1 (one) in a 1,000,000 (million) chance of going wrong, it you happen to be that one..Also, keep in mind that by statistics, you have more of a chance of having something go wrong when taking medications.  Who should not have this procedure? If you are on a blood thinning medication (e.g.  Coumadin, Plavix, see list of "Blood Thinners"), or if you have an active infection going on, you should not have the procedure.  If you are taking any blood thinners, please inform your physician.  How should I prepare for this procedure?  Do not eat or drink anything at least six hours prior to the procedure.  Bring a driver with you .  It cannot be a taxi.  Come accompanied by an adult that can drive you back, and that is strong enough to help you if your legs get weak or numb from the local anesthetic.  Take all of your medicines the morning of the procedure with just enough water to swallow them.  If you have diabetes, make sure that you are scheduled to have your procedure done first thing in the morning, whenever possible.  If you have diabetes, take only half of your insulin dose and notify our nurse that you have done so as soon as you arrive at the clinic.  If you are diabetic, but only take blood sugar pills (oral hypoglycemic), then do not take them on the morning of your procedure.    You may take them after you have had the procedure.  Do not take aspirin or any aspirin-containing medications, at least eleven (11) days prior to the procedure.  They may prolong bleeding.  Wear loose fitting clothing that may be easy to take off and that you would not mind if it got stained with Betadine or blood.  Do not wear any jewelry or perfume  Remove any nail coloring.  It will interfere with some of our monitoring equipment.  NOTE: Remember that this is not meant to be interpreted as a complete list of all possible complications.  Unforeseen problems may occur.  BLOOD THINNERS The following drugs contain aspirin or other products, which can cause increased bleeding during surgery and should not be taken for 2 weeks prior to and 1 week after surgery.  If you should need take something for relief of minor pain, you may take acetaminophen which is found in Tylenol,m Datril, Anacin-3 and  Panadol. It is not blood thinner. The products listed below are.  Do not take any of the products listed below in addition to any listed on your instruction sheet.  A.P.C or A.P.C with Codeine Codeine Phosphate Capsules #3 Ibuprofen Ridaura  ABC compound Congesprin Imuran rimadil  Advil Cope Indocin Robaxisal  Alka-Seltzer Effervescent Pain Reliever and Antacid Coricidin or Coricidin-D  Indomethacin Rufen  Alka-Seltzer plus Cold Medicine Cosprin Ketoprofen S-A-C Tablets  Anacin Analgesic Tablets or Capsules Coumadin Korlgesic Salflex  Anacin Extra Strength Analgesic tablets or capsules CP-2 Tablets Lanoril Salicylate  Anaprox Cuprimine Capsules Levenox Salocol  Anexsia-D Dalteparin Magan Salsalate  Anodynos Darvon compound Magnesium Salicylate Sine-off  Ansaid Dasin Capsules Magsal Sodium Salicylate  Anturane Depen Capsules Marnal Soma  APF Arthritis pain formula Dewitt's Pills Measurin Stanback  Argesic Dia-Gesic Meclofenamic Sulfinpyrazone  Arthritis Bayer Timed Release Aspirin Diclofenac Meclomen Sulindac  Arthritis pain formula Anacin Dicumarol Medipren Supac  Analgesic (Safety coated) Arthralgen Diffunasal Mefanamic Suprofen  Arthritis Strength Bufferin Dihydrocodeine Mepro Compound Suprol  Arthropan liquid Dopirydamole Methcarbomol with Aspirin Synalgos  ASA tablets/Enseals Disalcid Micrainin Tagament  Ascriptin Doan's Midol Talwin  Ascriptin A/D Dolene Mobidin Tanderil  Ascriptin Extra Strength Dolobid Moblgesic Ticlid  Ascriptin with Codeine Doloprin or Doloprin with Codeine Momentum Tolectin  Asperbuf Duoprin Mono-gesic Trendar  Aspergum Duradyne Motrin or Motrin IB Triminicin  Aspirin plain, buffered or enteric coated Durasal Myochrisine Trigesic  Aspirin Suppositories Easprin Nalfon Trillsate  Aspirin with Codeine Ecotrin Regular or Extra Strength Naprosyn Uracel  Atromid-S Efficin Naproxen Ursinus  Auranofin Capsules Elmiron Neocylate Vanquish  Axotal Emagrin Norgesic  Verin  Azathioprine Empirin or Empirin with Codeine Normiflo Vitamin E  Azolid Emprazil Nuprin Voltaren  Bayer Aspirin plain, buffered or children's or timed BC Tablets or powders Encaprin Orgaran Warfarin Sodium  Buff-a-Comp Enoxaparin Orudis Zorpin  Buff-a-Comp with Codeine Equegesic Os-Cal-Gesic   Buffaprin Excedrin plain, buffered or Extra Strength Oxalid   Bufferin Arthritis Strength Feldene Oxphenbutazone   Bufferin plain or Extra Strength Feldene Capsules Oxycodone with Aspirin   Bufferin with Codeine Fenoprofen Fenoprofen Pabalate or Pabalate-SF   Buffets II Flogesic Panagesic   Buffinol plain or Extra Strength Florinal or Florinal with Codeine Panwarfarin   Buf-Tabs Flurbiprofen Penicillamine   Butalbital Compound Four-way cold tablets Penicillin   Butazolidin Fragmin Pepto-Bismol   Carbenicillin Geminisyn Percodan   Carna Arthritis Reliever Geopen Persantine   Carprofen Gold's salt Persistin   Chloramphenicol Goody's Phenylbutazone   Chloromycetin Haltrain Piroxlcam   Clmetidine heparin Plaquenil   Cllnoril Hyco-pap Ponstel   Clofibrate Hydroxy chloroquine  Propoxyphen         Before stopping any of these medications, be sure to consult the physician who ordered them.  Some, such as Coumadin (Warfarin) are ordered to prevent or treat serious conditions such as "deep thrombosis", "pumonary embolisms", and other heart problems.  The amount of time that you may need off of the medication may also vary with the medication and the reason for which you were taking it.  If you are taking any of these medications, please make sure you notify your pain physician before you undergo any procedures.         GENERAL RISKS AND COMPLICATIONS  What are the risk, side effects and possible complications? Generally speaking, most procedures are safe.  However, with any procedure there are risks, side effects, and the possibility of complications.  The risks and complications are dependent  upon the sites that are lesioned, or the type of nerve block to be performed.  The closer the procedure is to the spine, the more serious the risks are.  Great care is taken when placing the radio frequency needles, block needles or lesioning probes, but sometimes complications can occur. 2. Infection: Any time there is an injection through the skin, there is a risk of infection.  This is why sterile conditions are used for these blocks.  There are four possible types of infection. 1. Localized skin infection. 2. Central Nervous System Infection-This can be in the form of Meningitis, which can be deadly. 3. Epidural Infections-This can be in the form of an epidural abscess, which can cause pressure inside of the spine, causing compression of the spinal cord with subsequent paralysis. This would require an emergency surgery to decompress, and there are no guarantees that the patient would recover from the paralysis. 4. Discitis-This is an infection of the intervertebral discs.  It occurs in about 1% of discography procedures.  It is difficult to treat and it may lead to surgery.        2. Pain: the needles have to go through skin and soft tissues, will cause soreness.       3. Damage to internal structures:  The nerves to be lesioned may be near blood vessels or    other nerves which can be potentially damaged.       4. Bleeding: Bleeding is more common if the patient is taking blood thinners such as  aspirin, Coumadin, Ticiid, Plavix, etc., or if he/she have some genetic predisposition  such as hemophilia. Bleeding into the spinal canal can cause compression of the spinal  cord with subsequent paralysis.  This would require an emergency surgery to  decompress and there are no guarantees that the patient would recover from the  paralysis.       5. Pneumothorax:  Puncturing of a lung is a possibility, every time a needle is introduced in  the area of the chest or upper back.  Pneumothorax refers to free air  around the  collapsed lung(s), inside of the thoracic cavity (chest cavity).  Another two possible  complications related to a similar event would include: Hemothorax and Chylothorax.   These are variations of the Pneumothorax, where instead of air around the collapsed  lung(s), you may have blood or chyle, respectively.       6. Spinal headaches: They may occur with any procedures in the area of the spine.       7. Persistent CSF (Cerebro-Spinal Fluid) leakage: This is a rare problem, but may  occur  with prolonged intrathecal or epidural catheters either due to the formation of a fistulous  track or a dural tear.       8. Nerve damage: By working so close to the spinal cord, there is always a possibility of  nerve damage, which could be as serious as a permanent spinal cord injury with  paralysis.       9. Death:  Although rare, severe deadly allergic reactions known as "Anaphylactic  reaction" can occur to any of the medications used.      10. Worsening of the symptoms:  We can always make thing worse.  What are the chances of something like this happening? Chances of any of this occuring are extremely low.  By statistics, you have more of a chance of getting killed in a motor vehicle accident: while driving to the hospital than any of the above occurring .  Nevertheless, you should be aware that they are possibilities.  In general, it is similar to taking a shower.  Everybody knows that you can slip, hit your head and get killed.  Does that mean that you should not shower again?  Nevertheless always keep in mind that statistics do not mean anything if you happen to be on the wrong side of them.  Even if a procedure has a 1 (one) in a 1,000,000 (million) chance of going wrong, it you happen to be that one..Also, keep in mind that by statistics, you have more of a chance of having something go wrong when taking medications.  Who should not have this procedure? If you are on a blood thinning medication  (e.g. Coumadin, Plavix, see list of "Blood Thinners"), or if you have an active infection going on, you should not have the procedure.  If you are taking any blood thinners, please inform your physician.  How should I prepare for this procedure?  Do not eat or drink anything at least six hours prior to the procedure.  Bring a driver with you .  It cannot be a taxi.  Come accompanied by an adult that can drive you back, and that is strong enough to help you if your legs get weak or numb from the local anesthetic.  Take all of your medicines the morning of the procedure with just enough water to swallow them.  If you have diabetes, make sure that you are scheduled to have your procedure done first thing in the morning, whenever possible.  If you have diabetes, take only half of your insulin dose and notify our nurse that you have done so as soon as you arrive at the clinic.  If you are diabetic, but only take blood sugar pills (oral hypoglycemic), then do not take them on the morning of your procedure.  You may take them after you have had the procedure.  Do not take aspirin or any aspirin-containing medications, at least eleven (11) days prior to the procedure.  They may prolong bleeding.  Wear loose fitting clothing that may be easy to take off and that you would not mind if it got stained with Betadine or blood.  Do not wear any jewelry or perfume  Remove any nail coloring.  It will interfere with some of our monitoring equipment.  NOTE: Remember that this is not meant to be interpreted as a complete list of all possible complications.  Unforeseen problems may occur.  BLOOD THINNERS The following drugs contain aspirin or other products, which can cause increased bleeding during  surgery and should not be taken for 2 weeks prior to and 1 week after surgery.  If you should need take something for relief of minor pain, you may take acetaminophen which is found in Tylenol,m Datril, Anacin-3  and Panadol. It is not blood thinner. The products listed below are.  Do not take any of the products listed below in addition to any listed on your instruction sheet.  A.P.C or A.P.C with Codeine Codeine Phosphate Capsules #3 Ibuprofen Ridaura  ABC compound Congesprin Imuran rimadil  Advil Cope Indocin Robaxisal  Alka-Seltzer Effervescent Pain Reliever and Antacid Coricidin or Coricidin-D  Indomethacin Rufen  Alka-Seltzer plus Cold Medicine Cosprin Ketoprofen S-A-C Tablets  Anacin Analgesic Tablets or Capsules Coumadin Korlgesic Salflex  Anacin Extra Strength Analgesic tablets or capsules CP-2 Tablets Lanoril Salicylate  Anaprox Cuprimine Capsules Levenox Salocol  Anexsia-D Dalteparin Magan Salsalate  Anodynos Darvon compound Magnesium Salicylate Sine-off  Ansaid Dasin Capsules Magsal Sodium Salicylate  Anturane Depen Capsules Marnal Soma  APF Arthritis pain formula Dewitt's Pills Measurin Stanback  Argesic Dia-Gesic Meclofenamic Sulfinpyrazone  Arthritis Bayer Timed Release Aspirin Diclofenac Meclomen Sulindac  Arthritis pain formula Anacin Dicumarol Medipren Supac  Analgesic (Safety coated) Arthralgen Diffunasal Mefanamic Suprofen  Arthritis Strength Bufferin Dihydrocodeine Mepro Compound Suprol  Arthropan liquid Dopirydamole Methcarbomol with Aspirin Synalgos  ASA tablets/Enseals Disalcid Micrainin Tagament  Ascriptin Doan's Midol Talwin  Ascriptin A/D Dolene Mobidin Tanderil  Ascriptin Extra Strength Dolobid Moblgesic Ticlid  Ascriptin with Codeine Doloprin or Doloprin with Codeine Momentum Tolectin  Asperbuf Duoprin Mono-gesic Trendar  Aspergum Duradyne Motrin or Motrin IB Triminicin  Aspirin plain, buffered or enteric coated Durasal Myochrisine Trigesic  Aspirin Suppositories Easprin Nalfon Trillsate  Aspirin with Codeine Ecotrin Regular or Extra Strength Naprosyn Uracel  Atromid-S Efficin Naproxen Ursinus  Auranofin Capsules Elmiron Neocylate Vanquish  Axotal Emagrin  Norgesic Verin  Azathioprine Empirin or Empirin with Codeine Normiflo Vitamin E  Azolid Emprazil Nuprin Voltaren  Bayer Aspirin plain, buffered or children's or timed BC Tablets or powders Encaprin Orgaran Warfarin Sodium  Buff-a-Comp Enoxaparin Orudis Zorpin  Buff-a-Comp with Codeine Equegesic Os-Cal-Gesic   Buffaprin Excedrin plain, buffered or Extra Strength Oxalid   Bufferin Arthritis Strength Feldene Oxphenbutazone   Bufferin plain or Extra Strength Feldene Capsules Oxycodone with Aspirin   Bufferin with Codeine Fenoprofen Fenoprofen Pabalate or Pabalate-SF   Buffets II Flogesic Panagesic   Buffinol plain or Extra Strength Florinal or Florinal with Codeine Panwarfarin   Buf-Tabs Flurbiprofen Penicillamine   Butalbital Compound Four-way cold tablets Penicillin   Butazolidin Fragmin Pepto-Bismol   Carbenicillin Geminisyn Percodan   Carna Arthritis Reliever Geopen Persantine   Carprofen Gold's salt Persistin   Chloramphenicol Goody's Phenylbutazone   Chloromycetin Haltrain Piroxlcam   Clmetidine heparin Plaquenil   Cllnoril Hyco-pap Ponstel   Clofibrate Hydroxy chloroquine Propoxyphen         Before stopping any of these medications, be sure to consult the physician who ordered them.  Some, such as Coumadin (Warfarin) are ordered to prevent or treat serious conditions such as "deep thrombosis", "pumonary embolisms", and other heart problems.  The amount of time that you may need off of the medication may also vary with the medication and the reason for which you were taking it.  If you are taking any of these medications, please make sure you notify your pain physician before you undergo any procedures.         GENERAL RISKS AND COMPLICATIONS  What are the risk, side effects and possible complications? Generally speaking, most  procedures are safe.  However, with any procedure there are risks, side effects, and the possibility of complications.  The risks and complications are  dependent upon the sites that are lesioned, or the type of nerve block to be performed.  The closer the procedure is to the spine, the more serious the risks are.  Great care is taken when placing the radio frequency needles, block needles or lesioning probes, but sometimes complications can occur. 3. Infection: Any time there is an injection through the skin, there is a risk of infection.  This is why sterile conditions are used for these blocks.  There are four possible types of infection. 1. Localized skin infection. 2. Central Nervous System Infection-This can be in the form of Meningitis, which can be deadly. 3. Epidural Infections-This can be in the form of an epidural abscess, which can cause pressure inside of the spine, causing compression of the spinal cord with subsequent paralysis. This would require an emergency surgery to decompress, and there are no guarantees that the patient would recover from the paralysis. 4. Discitis-This is an infection of the intervertebral discs.  It occurs in about 1% of discography procedures.  It is difficult to treat and it may lead to surgery.        2. Pain: the needles have to go through skin and soft tissues, will cause soreness.       3. Damage to internal structures:  The nerves to be lesioned may be near blood vessels or    other nerves which can be potentially damaged.       4. Bleeding: Bleeding is more common if the patient is taking blood thinners such as  aspirin, Coumadin, Ticiid, Plavix, etc., or if he/she have some genetic predisposition  such as hemophilia. Bleeding into the spinal canal can cause compression of the spinal  cord with subsequent paralysis.  This would require an emergency surgery to  decompress and there are no guarantees that the patient would recover from the  paralysis.       5. Pneumothorax:  Puncturing of a lung is a possibility, every time a needle is introduced in  the area of the chest or upper back.  Pneumothorax refers  to free air around the  collapsed lung(s), inside of the thoracic cavity (chest cavity).  Another two possible  complications related to a similar event would include: Hemothorax and Chylothorax.   These are variations of the Pneumothorax, where instead of air around the collapsed  lung(s), you may have blood or chyle, respectively.       6. Spinal headaches: They may occur with any procedures in the area of the spine.       7. Persistent CSF (Cerebro-Spinal Fluid) leakage: This is a rare problem, but may occur  with prolonged intrathecal or epidural catheters either due to the formation of a fistulous  track or a dural tear.       8. Nerve damage: By working so close to the spinal cord, there is always a possibility of  nerve damage, which could be as serious as a permanent spinal cord injury with  paralysis.       9. Death:  Although rare, severe deadly allergic reactions known as "Anaphylactic  reaction" can occur to any of the medications used.      10. Worsening of the symptoms:  We can always make thing worse.  What are the chances of something like this happening? Chances of any of this occuring  are extremely low.  By statistics, you have more of a chance of getting killed in a motor vehicle accident: while driving to the hospital than any of the above occurring .  Nevertheless, you should be aware that they are possibilities.  In general, it is similar to taking a shower.  Everybody knows that you can slip, hit your head and get killed.  Does that mean that you should not shower again?  Nevertheless always keep in mind that statistics do not mean anything if you happen to be on the wrong side of them.  Even if a procedure has a 1 (one) in a 1,000,000 (million) chance of going wrong, it you happen to be that one..Also, keep in mind that by statistics, you have more of a chance of having something go wrong when taking medications.  Who should not have this procedure? If you are on a blood thinning  medication (e.g. Coumadin, Plavix, see list of "Blood Thinners"), or if you have an active infection going on, you should not have the procedure.  If you are taking any blood thinners, please inform your physician.  How should I prepare for this procedure?  Do not eat or drink anything at least six hours prior to the procedure.  Bring a driver with you .  It cannot be a taxi.  Come accompanied by an adult that can drive you back, and that is strong enough to help you if your legs get weak or numb from the local anesthetic.  Take all of your medicines the morning of the procedure with just enough water to swallow them.  If you have diabetes, make sure that you are scheduled to have your procedure done first thing in the morning, whenever possible.  If you have diabetes, take only half of your insulin dose and notify our nurse that you have done so as soon as you arrive at the clinic.  If you are diabetic, but only take blood sugar pills (oral hypoglycemic), then do not take them on the morning of your procedure.  You may take them after you have had the procedure.  Do not take aspirin or any aspirin-containing medications, at least eleven (11) days prior to the procedure.  They may prolong bleeding.  Wear loose fitting clothing that may be easy to take off and that you would not mind if it got stained with Betadine or blood.  Do not wear any jewelry or perfume  Remove any nail coloring.  It will interfere with some of our monitoring equipment.  NOTE: Remember that this is not meant to be interpreted as a complete list of all possible complications.  Unforeseen problems may occur.  BLOOD THINNERS The following drugs contain aspirin or other products, which can cause increased bleeding during surgery and should not be taken for 2 weeks prior to and 1 week after surgery.  If you should need take something for relief of minor pain, you may take acetaminophen which is found in Tylenol,m  Datril, Anacin-3 and Panadol. It is not blood thinner. The products listed below are.  Do not take any of the products listed below in addition to any listed on your instruction sheet.  A.P.C or A.P.C with Codeine Codeine Phosphate Capsules #3 Ibuprofen Ridaura  ABC compound Congesprin Imuran rimadil  Advil Cope Indocin Robaxisal  Alka-Seltzer Effervescent Pain Reliever and Antacid Coricidin or Coricidin-D  Indomethacin Rufen  Alka-Seltzer plus Cold Medicine Cosprin Ketoprofen S-A-C Tablets  Anacin Analgesic Tablets or Capsules Coumadin  Korlgesic Salflex  Anacin Extra Strength Analgesic tablets or capsules CP-2 Tablets Lanoril Salicylate  Anaprox Cuprimine Capsules Levenox Salocol  Anexsia-D Dalteparin Magan Salsalate  Anodynos Darvon compound Magnesium Salicylate Sine-off  Ansaid Dasin Capsules Magsal Sodium Salicylate  Anturane Depen Capsules Marnal Soma  APF Arthritis pain formula Dewitt's Pills Measurin Stanback  Argesic Dia-Gesic Meclofenamic Sulfinpyrazone  Arthritis Bayer Timed Release Aspirin Diclofenac Meclomen Sulindac  Arthritis pain formula Anacin Dicumarol Medipren Supac  Analgesic (Safety coated) Arthralgen Diffunasal Mefanamic Suprofen  Arthritis Strength Bufferin Dihydrocodeine Mepro Compound Suprol  Arthropan liquid Dopirydamole Methcarbomol with Aspirin Synalgos  ASA tablets/Enseals Disalcid Micrainin Tagament  Ascriptin Doan's Midol Talwin  Ascriptin A/D Dolene Mobidin Tanderil  Ascriptin Extra Strength Dolobid Moblgesic Ticlid  Ascriptin with Codeine Doloprin or Doloprin with Codeine Momentum Tolectin  Asperbuf Duoprin Mono-gesic Trendar  Aspergum Duradyne Motrin or Motrin IB Triminicin  Aspirin plain, buffered or enteric coated Durasal Myochrisine Trigesic  Aspirin Suppositories Easprin Nalfon Trillsate  Aspirin with Codeine Ecotrin Regular or Extra Strength Naprosyn Uracel  Atromid-S Efficin Naproxen Ursinus  Auranofin Capsules Elmiron Neocylate Vanquish   Axotal Emagrin Norgesic Verin  Azathioprine Empirin or Empirin with Codeine Normiflo Vitamin E  Azolid Emprazil Nuprin Voltaren  Bayer Aspirin plain, buffered or children's or timed BC Tablets or powders Encaprin Orgaran Warfarin Sodium  Buff-a-Comp Enoxaparin Orudis Zorpin  Buff-a-Comp with Codeine Equegesic Os-Cal-Gesic   Buffaprin Excedrin plain, buffered or Extra Strength Oxalid   Bufferin Arthritis Strength Feldene Oxphenbutazone   Bufferin plain or Extra Strength Feldene Capsules Oxycodone with Aspirin   Bufferin with Codeine Fenoprofen Fenoprofen Pabalate or Pabalate-SF   Buffets II Flogesic Panagesic   Buffinol plain or Extra Strength Florinal or Florinal with Codeine Panwarfarin   Buf-Tabs Flurbiprofen Penicillamine   Butalbital Compound Four-way cold tablets Penicillin   Butazolidin Fragmin Pepto-Bismol   Carbenicillin Geminisyn Percodan   Carna Arthritis Reliever Geopen Persantine   Carprofen Gold's salt Persistin   Chloramphenicol Goody's Phenylbutazone   Chloromycetin Haltrain Piroxlcam   Clmetidine heparin Plaquenil   Cllnoril Hyco-pap Ponstel   Clofibrate Hydroxy chloroquine Propoxyphen         Before stopping any of these medications, be sure to consult the physician who ordered them.  Some, such as Coumadin (Warfarin) are ordered to prevent or treat serious conditions such as "deep thrombosis", "pumonary embolisms", and other heart problems.  The amount of time that you may need off of the medication may also vary with the medication and the reason for which you were taking it.  If you are taking any of these medications, please make sure you notify your pain physician before you undergo any procedures.          Epidural Steroid Injection An epidural steroid injection is a shot of steroid medicine and numbing medicine that is given into the space between the spinal cord and the bones in your back (epidural space). The shot helps relieve pain caused by an  irritated or swollen nerve root. The amount of pain relief you get from the injection depends on what is causing the nerve to be swollen and irritated, and how long your pain lasts. You are more likely to benefit from this injection if your pain is strong and comes on suddenly rather than if you have had pain for a long time. Tell a health care provider about:  Any allergies you have.  All medicines you are taking, including vitamins, herbs, eye drops, creams, and over-the-counter medicines.  Any problems you or family members have  had with anesthetic medicines.  Any blood disorders you have.  Any surgeries you have had.  Any medical conditions you have.  Whether you are pregnant or may be pregnant. What are the risks? Generally, this is a safe procedure. However, problems may occur, including:  Headache.  Bleeding.  Infection.  Allergic reaction to medicines.  Damage to your nerves.  What happens before the procedure? Staying hydrated Follow instructions from your health care provider about hydration, which may include:  Up to 2 hours before the procedure - you may continue to drink clear liquids, such as water, clear fruit juice, black coffee, and plain tea.  Eating and drinking restrictions Follow instructions from your health care provider about eating and drinking, which may include:  8 hours before the procedure - stop eating heavy meals or foods such as meat, fried foods, or fatty foods.  6 hours before the procedure - stop eating light meals or foods, such as toast or cereal.  6 hours before the procedure - stop drinking milk or drinks that contain milk.  2 hours before the procedure - stop drinking clear liquids.  Medicine  You may be given medicines to lower anxiety.  Ask your health care provider about: ? Changing or stopping your regular medicines. This is especially important if you are taking diabetes medicines or blood thinners. ? Taking medicines  such as aspirin and ibuprofen. These medicines can thin your blood. Do not take these medicines before your procedure if your health care provider instructs you not to. General instructions  Plan to have someone take you home from the hospital or clinic. What happens during the procedure?  You may receive a medicine to help you relax (sedative).  You will be asked to lie on your abdomen.  The injection site will be cleaned.  A numbing medicine (local anesthetic) will be used to numb the injection site.  A needle will be inserted through your skin into the epidural space. You may feel some discomfort when this happens. An X-ray machine will be used to make sure the needle is put as close as possible to the affected nerve.  A steroid medicine and a local anesthetic will be injected into the epidural space.  The needle will be removed.  A bandage (dressing) will be put over the injection site. What happens after the procedure?  Your blood pressure, heart rate, breathing rate, and blood oxygen level will be monitored until the medicines you were given have worn off.  Your arm or leg may feel weak or numb for a few hours.  The injection site may feel sore.  Do not drive for 24 hours if you received a sedative. This information is not intended to replace advice given to you by your health care provider. Make sure you discuss any questions you have with your health care provider. Document Released: 04/23/2007 Document Revised: 06/28/2015 Document Reviewed: 05/02/2015 Elsevier Interactive Patient Education  2017 [employer].

## 2016-10-28 NOTE — Progress Notes (Signed)
Nursing Pain Medication Assessment:  Safety precautions to be maintained throughout the outpatient stay will include: orient to surroundings, keep bed in low position, maintain call bell within reach at all times, provide assistance with transfer out of bed and ambulation.  Medication Inspection Compliance: Pill count conducted under aseptic conditions, in front of the patient. Neither the pills nor the bottle was removed from the patient's sight at any time. Once count was completed pills were immediately returned to the patient in their original bottle.  Medication: Oxycodone IR Pill/Patch Count: 66 of 90 pills remain Pill/Patch Appearance: Markings consistent with prescribed medication Bottle Appearance: Standard pharmacy container. Clearly labeled. Filled Date: 09 / 17 / 2018 Last Medication intake:  Today

## 2016-10-28 NOTE — Progress Notes (Signed)
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 Pills: Bring all pain pills to every appointment (except for procedure appointments). 2. Pill Bottles: Bring pills in original pharmacy bottle. Always bring newest bottle. Bring bottle, even if empty. 3. Medication refills: You are responsible for knowing and keeping track of what medications you need refilled. The day before your appointment, write a list of all prescriptions that need to be refilled. Bring that list to your appointment and give it to the admitting nurse. Prescriptions will be written only during appointments. If you forget a medication, it will not be "Called in", "Faxed", or "electronically sent". You will need to get another appointment to get these prescribed. 4. Prescription Accuracy: You are responsible for carefully inspecting your prescriptions before leaving our office. Have the discharge nurse carefully go over each  prescription with you, before taking them home. Make sure that your name is accurately spelled, that your address is correct. Check the name and dose of your medication to make sure it is accurate. Check the number of pills, and the written instructions to make sure they are clear and accurate. Make sure that you are given enough medication to last until your next medication refill appointment. 5. Taking Medication: Take medication as prescribed. Never take more pills than instructed. Never take medication more frequently than prescribed. Taking less pills or less frequently is permitted and encouraged, when it comes to controlled substances (written prescriptions).  6. Inform other Doctors: Always inform, all of your healthcare providers, of all the medications you take. 7. Pain Medication from other Providers: You are not allowed to accept any additional pain medication from any other Doctor or Healthcare provider. There are two exceptions to this rule. (see below) In the event that you require additional pain medication, you are responsible for notifying us, as stated below. 8. Medication Agreement: You are responsible for carefully reading and following our Medication Agreement. This must be signed before receiving any prescriptions from our practice. Safely store a copy of your signed Agreement. Violations to the Agreement will result in no further prescriptions. (Additional copies of our Medication Agreement are available upon request.) 9. Laws, Rules, & Regulations: All patients are expected to follow all Federal and [employer], [employer], Rules, [facility]. Ignorance of the Laws does not constitute a valid excuse. The use of any illegal substances is prohibited. 10. Adopted  CDC guidelines & recommendations: Target dosing levels will be at or below 60 MME/day. Use of benzodiazepines** is not recommended.  Exceptions: There are only two exceptions to the rule of not receiving pain medications from  other Healthcare Providers. 1. Exception #1 (Emergencies): In the event of an emergency (i.e.: accident requiring emergency care), you are allowed to receive additional pain medication. However, you are responsible for: As soon as you are able, call our office [phone], at any time of the day or night, and leave a message stating your name, the date and nature of the emergency, and the name and dose of the medication prescribed. In the event that your call is answered by a member of our staff, make sure to document and save the date, time, and the name of the person that took your information.  2. Exception #2 (Planned Surgery): In the event that you are scheduled by another doctor or dentist to have any type of surgery or procedure, you are allowed (for a period no longer than 30 days), to receive additional pain medication, for the acute post-op pain. However, in this case, you are responsible for picking up a copy of our "Post-op Pain Management for Surgeons" handout, and giving it to your surgeon or dentist. This document is available at our office, and does not require an appointment to obtain it. Simply go to our office during business hours (Monday-Thursday from 8:00 AM to 4:00 PM) (Friday 8:00 AM to 12:00 Noon) or if you have a scheduled appointment with [place], prior to your surgery, and ask for it by name. In addition, you will need to provide [place] with your name, name of your surgeon, type of surgery, and date of procedure or surgery.  *Opioid medications include: morphine, codeine, oxycodone, oxymorphone, hydrocodone, hydromorphone, meperidine, tramadol, tapentadol, buprenorphine, fentanyl, methadone. **Benzodiazepine medications include: diazepam (Valium), alprazolam (Xanax), clonazepam (Klonopine), lorazepam (Ativan), clorazepate (Tranxene), chlordiazepoxide (Librium), estazolam (Prosom), oxazepam (Serax), temazepam (Restoril), triazolam  (Halcion)  ____________________________________________________________________________________________ GENERAL RISKS AND COMPLICATIONS  What are the risk, side effects and possible complications? Generally speaking, most procedures are safe.  However, with any procedure there are risks, side effects, and the possibility of complications.  The risks and complications are dependent upon the sites that are lesioned, or the type of nerve block to be performed.  The closer the procedure is to the spine, the more serious the risks are.  Great care is taken when placing the radio frequency needles, block needles or lesioning probes, but sometimes complications can occur. 1. Infection: Any time there is an injection through the skin, there is a risk of infection.  This is why sterile conditions are used for these blocks.  There are four possible types of infection. 1. Localized skin infection. 2. Central Nervous System Infection-This can be in the form of Meningitis, which can be deadly. 3. Epidural Infections-This can be in the form of an epidural abscess, which can cause pressure inside of the spine, causing compression of the spinal cord with subsequent paralysis. This would require an emergency surgery to decompress, and there are no guarantees that the patient would recover from the paralysis. 4. Discitis-This is an infection of the intervertebral discs.  It occurs in about 1% of discography procedures.  It is difficult to treat and it may lead to surgery.        2. Pain: the needles have to go through skin and soft tissues, will cause soreness.       3. Damage to  internal structures:  The nerves to be lesioned may be near blood vessels or    other nerves which can be potentially damaged.       4. Bleeding: Bleeding is more common if the patient is taking blood thinners such as  aspirin, Coumadin, Ticiid, Plavix, etc., or if he/she have some genetic predisposition  such as hemophilia. Bleeding into the  spinal canal can cause compression of the spinal  cord with subsequent paralysis.  This would require an emergency surgery to  decompress and there are no guarantees that the patient would recover from the  paralysis.       5. Pneumothorax:  Puncturing of a lung is a possibility, every time a needle is introduced in  the area of the chest or upper back.  Pneumothorax refers to free air around the  collapsed lung(s), inside of the thoracic cavity (chest cavity).  Another two possible  complications related to a similar event would include: Hemothorax and Chylothorax.   These are variations of the Pneumothorax, where instead of air around the collapsed  lung(s), you may have blood or chyle, respectively.       6. Spinal headaches: They may occur with any procedures in the area of the spine.       7. Persistent CSF (Cerebro-Spinal Fluid) leakage: This is a rare problem, but may occur  with prolonged intrathecal or epidural catheters either due to the formation of a fistulous  track or a dural tear.       8. Nerve damage: By working so close to the spinal cord, there is always a possibility of  nerve damage, which could be as serious as a permanent spinal cord injury with  paralysis.       9. Death:  Although rare, severe deadly allergic reactions known as "Anaphylactic  reaction" can occur to any of the medications used.      10. Worsening of the symptoms:  We can always make thing worse.  What are the chances of something like this happening? Chances of any of this occuring are extremely low.  By statistics, you have more of a chance of getting killed in a motor vehicle accident: while driving to the hospital than any of the above occurring .  Nevertheless, you should be aware that they are possibilities.  In general, it is similar to taking a shower.  Everybody knows that you can slip, hit your head and get killed.  Does that mean that you should not shower again?  Nevertheless always keep in mind that  statistics do not mean anything if you happen to be on the wrong side of them.  Even if a procedure has a 1 (one) in a 1,000,000 (million) chance of going wrong, it you happen to be that one..Also, keep in mind that by statistics, you have more of a chance of having something go wrong when taking medications.  Who should not have this procedure? If you are on a blood thinning medication (e.g. Coumadin, Plavix, see list of "Blood Thinners"), or if you have an active infection going on, you should not have the procedure.  If you are taking any blood thinners, please inform your physician.  How should I prepare for this procedure?  Do not eat or drink anything at least six hours prior to the procedure.  Bring a driver with you .  It cannot be a taxi.  Come accompanied by an adult that can drive you back, and that is strong  enough to help you if your legs get weak or numb from the local anesthetic.  Take all of your medicines the morning of the procedure with just enough water to swallow them.  If you have diabetes, make sure that you are scheduled to have your procedure done first thing in the morning, whenever possible.  If you have diabetes, take only half of your insulin dose and notify our nurse that you have done so as soon as you arrive at the clinic.  If you are diabetic, but only take blood sugar pills (oral hypoglycemic), then do not take them on the morning of your procedure.  You may take them after you have had the procedure.  Do not take aspirin or any aspirin-containing medications, at least eleven (11) days prior to the procedure.  They may prolong bleeding.  Wear loose fitting clothing that may be easy to take off and that you would not mind if it got stained with Betadine or blood.  Do not wear any jewelry or perfume  Remove any nail coloring.  It will interfere with some of our monitoring equipment.  NOTE: Remember that this is not meant to be interpreted as a complete  list of all possible complications.  Unforeseen problems may occur.  BLOOD THINNERS The following drugs contain aspirin or other products, which can cause increased bleeding during surgery and should not be taken for 2 weeks prior to and 1 week after surgery.  If you should need take something for relief of minor pain, you may take acetaminophen which is found in Tylenol,m Datril, Anacin-3 and Panadol. It is not blood thinner. The products listed below are.  Do not take any of the products listed below in addition to any listed on your instruction sheet.  A.P.C or A.P.C with Codeine Codeine Phosphate Capsules #3 Ibuprofen Ridaura  ABC compound Congesprin Imuran rimadil  Advil Cope Indocin Robaxisal  Alka-Seltzer Effervescent Pain Reliever and Antacid Coricidin or Coricidin-D  Indomethacin Rufen  Alka-Seltzer plus Cold Medicine Cosprin Ketoprofen S-A-C Tablets  Anacin Analgesic Tablets or Capsules Coumadin Korlgesic Salflex  Anacin Extra Strength Analgesic tablets or capsules CP-2 Tablets Lanoril Salicylate  Anaprox Cuprimine Capsules Levenox Salocol  Anexsia-D Dalteparin Magan Salsalate  Anodynos Darvon compound Magnesium Salicylate Sine-off  Ansaid Dasin Capsules Magsal Sodium Salicylate  Anturane Depen Capsules Marnal Soma  APF Arthritis pain formula Dewitt's Pills Measurin Stanback  Argesic Dia-Gesic Meclofenamic Sulfinpyrazone  Arthritis Bayer Timed Release Aspirin Diclofenac Meclomen Sulindac  Arthritis pain formula Anacin Dicumarol Medipren Supac  Analgesic (Safety coated) Arthralgen Diffunasal Mefanamic Suprofen  Arthritis Strength Bufferin Dihydrocodeine Mepro Compound Suprol  Arthropan liquid Dopirydamole Methcarbomol with Aspirin Synalgos  ASA tablets/Enseals Disalcid Micrainin Tagament  Ascriptin Doan's Midol Talwin  Ascriptin A/D Dolene Mobidin Tanderil  Ascriptin Extra Strength Dolobid Moblgesic Ticlid  Ascriptin with Codeine Doloprin or Doloprin with Codeine Momentum  Tolectin  Asperbuf Duoprin Mono-gesic Trendar  Aspergum Duradyne Motrin or Motrin IB Triminicin  Aspirin plain, buffered or enteric coated Durasal Myochrisine Trigesic  Aspirin Suppositories Easprin Nalfon Trillsate  Aspirin with Codeine Ecotrin Regular or Extra Strength Naprosyn Uracel  Atromid-S Efficin Naproxen Ursinus  Auranofin Capsules Elmiron Neocylate Vanquish  Axotal Emagrin Norgesic Verin  Azathioprine Empirin or Empirin with Codeine Normiflo Vitamin E  Azolid Emprazil Nuprin Voltaren  Bayer Aspirin plain, buffered or children's or timed BC Tablets or powders Encaprin Orgaran Warfarin Sodium  Buff-a-Comp Enoxaparin Orudis Zorpin  Buff-a-Comp with Codeine Equegesic Os-Cal-Gesic   Buffaprin Excedrin plain, buffered or Extra Strength  Oxalid   Bufferin Arthritis Strength Feldene Oxphenbutazone   Bufferin plain or Extra Strength Feldene Capsules Oxycodone with Aspirin   Bufferin with Codeine Fenoprofen Fenoprofen Pabalate or Pabalate-SF   Buffets II Flogesic Panagesic   Buffinol plain or Extra Strength Florinal or Florinal with Codeine Panwarfarin   Buf-Tabs Flurbiprofen Penicillamine   Butalbital Compound Four-way cold tablets Penicillin   Butazolidin Fragmin Pepto-Bismol   Carbenicillin Geminisyn Percodan   Carna Arthritis Reliever Geopen Persantine   Carprofen Gold's salt Persistin   Chloramphenicol Goody's Phenylbutazone   Chloromycetin Haltrain Piroxlcam   Clmetidine heparin Plaquenil   Cllnoril Hyco-pap Ponstel   Clofibrate Hydroxy chloroquine Propoxyphen         Before stopping any of these medications, be sure to consult the physician who ordered them.  Some, such as Coumadin (Warfarin) are ordered to prevent or treat serious conditions such as "deep thrombosis", "pumonary embolisms", and other heart problems.  The amount of time that you may need off of the medication may also vary with the medication and the reason for which you were taking it.  If you are taking any  of these medications, please make sure you notify your pain physician before you undergo any procedures.         GENERAL RISKS AND COMPLICATIONS  What are the risk, side effects and possible complications? Generally speaking, most procedures are safe.  However, with any procedure there are risks, side effects, and the possibility of complications.  The risks and complications are dependent upon the sites that are lesioned, or the type of nerve block to be performed.  The closer the procedure is to the spine, the more serious the risks are.  Great care is taken when placing the radio frequency needles, block needles or lesioning probes, but sometimes complications can occur. 2. Infection: Any time there is an injection through the skin, there is a risk of infection.  This is why sterile conditions are used for these blocks.  There are four possible types of infection. 1. Localized skin infection. 2. Central Nervous System Infection-This can be in the form of Meningitis, which can be deadly. 3. Epidural Infections-This can be in the form of an epidural abscess, which can cause pressure inside of the spine, causing compression of the spinal cord with subsequent paralysis. This would require an emergency surgery to decompress, and there are no guarantees that the patient would recover from the paralysis. 4. Discitis-This is an infection of the intervertebral discs.  It occurs in about 1% of discography procedures.  It is difficult to treat and it may lead to surgery.        2. Pain: the needles have to go through skin and soft tissues, will cause soreness.       3. Damage to internal structures:  The nerves to be lesioned may be near blood vessels or    other nerves which can be potentially damaged.       4. Bleeding: Bleeding is more common if the patient is taking blood thinners such as  aspirin, Coumadin, Ticiid, Plavix, etc., or if he/she have some genetic predisposition  such as hemophilia.  Bleeding into the spinal canal can cause compression of the spinal  cord with subsequent paralysis.  This would require an emergency surgery to  decompress and there are no guarantees that the patient would recover from the  paralysis.       5. Pneumothorax:  Puncturing of a lung is a possibility, every time a needle  is introduced in  the area of the chest or upper back.  Pneumothorax refers to free air around the  collapsed lung(s), inside of the thoracic cavity (chest cavity).  Another two possible  complications related to a similar event would include: Hemothorax and Chylothorax.   These are variations of the Pneumothorax, where instead of air around the collapsed  lung(s), you may have blood or chyle, respectively.       6. Spinal headaches: They may occur with any procedures in the area of the spine.       7. Persistent CSF (Cerebro-Spinal Fluid) leakage: This is a rare problem, but may occur  with prolonged intrathecal or epidural catheters either due to the formation of a fistulous  track or a dural tear.       8. Nerve damage: By working so close to the spinal cord, there is always a possibility of  nerve damage, which could be as serious as a permanent spinal cord injury with  paralysis.       9. Death:  Although rare, severe deadly allergic reactions known as "Anaphylactic  reaction" can occur to any of the medications used.      10. Worsening of the symptoms:  We can always make thing worse.  What are the chances of something like this happening? Chances of any of this occuring are extremely low.  By statistics, you have more of a chance of getting killed in a motor vehicle accident: while driving to the hospital than any of the above occurring .  Nevertheless, you should be aware that they are possibilities.  In general, it is similar to taking a shower.  Everybody knows that you can slip, hit your head and get killed.  Does that mean that you should not shower again?  Nevertheless always keep  in mind that statistics do not mean anything if you happen to be on the wrong side of them.  Even if a procedure has a 1 (one) in a 1,000,000 (million) chance of going wrong, it you happen to be that one..Also, keep in mind that by statistics, you have more of a chance of having something go wrong when taking medications.  Who should not have this procedure? If you are on a blood thinning medication (e.g. Coumadin, Plavix, see list of "Blood Thinners"), or if you have an active infection going on, you should not have the procedure.  If you are taking any blood thinners, please inform your physician.  How should I prepare for this procedure?  Do not eat or drink anything at least six hours prior to the procedure.  Bring a driver with you .  It cannot be a taxi.  Come accompanied by an adult that can drive you back, and that is strong enough to help you if your legs get weak or numb from the local anesthetic.  Take all of your medicines the morning of the procedure with just enough water to swallow them.  If you have diabetes, make sure that you are scheduled to have your procedure done first thing in the morning, whenever possible.  If you have diabetes, take only half of your insulin dose and notify our nurse that you have done so as soon as you arrive at the clinic.  If you are diabetic, but only take blood sugar pills (oral hypoglycemic), then do not take them on the morning of your procedure.  You may take them after you have had the procedure.  Do not  take aspirin or any aspirin-containing medications, at least eleven (11) days prior to the procedure.  They may prolong bleeding.  Wear loose fitting clothing that may be easy to take off and that you would not mind if it got stained with Betadine or blood.  Do not wear any jewelry or perfume  Remove any nail coloring.  It will interfere with some of our monitoring equipment.  NOTE: Remember that this is not meant to be interpreted as a  complete list of all possible complications.  Unforeseen problems may occur.  BLOOD THINNERS The following drugs contain aspirin or other products, which can cause increased bleeding during surgery and should not be taken for 2 weeks prior to and 1 week after surgery.  If you should need take something for relief of minor pain, you may take acetaminophen which is found in Tylenol,m Datril, Anacin-3 and Panadol. It is not blood thinner. The products listed below are.  Do not take any of the products listed below in addition to any listed on your instruction sheet.  A.P.C or A.P.C with Codeine Codeine Phosphate Capsules #3 Ibuprofen Ridaura  ABC compound Congesprin Imuran rimadil  Advil Cope Indocin Robaxisal  Alka-Seltzer Effervescent Pain Reliever and Antacid Coricidin or Coricidin-D  Indomethacin Rufen  Alka-Seltzer plus Cold Medicine Cosprin Ketoprofen S-A-C Tablets  Anacin Analgesic Tablets or Capsules Coumadin Korlgesic Salflex  Anacin Extra Strength Analgesic tablets or capsules CP-2 Tablets Lanoril Salicylate  Anaprox Cuprimine Capsules Levenox Salocol  Anexsia-D Dalteparin Magan Salsalate  Anodynos Darvon compound Magnesium Salicylate Sine-off  Ansaid Dasin Capsules Magsal Sodium Salicylate  Anturane Depen Capsules Marnal Soma  APF Arthritis pain formula Dewitt's Pills Measurin Stanback  Argesic Dia-Gesic Meclofenamic Sulfinpyrazone  Arthritis Bayer Timed Release Aspirin Diclofenac Meclomen Sulindac  Arthritis pain formula Anacin Dicumarol Medipren Supac  Analgesic (Safety coated) Arthralgen Diffunasal Mefanamic Suprofen  Arthritis Strength Bufferin Dihydrocodeine Mepro Compound Suprol  Arthropan liquid Dopirydamole Methcarbomol with Aspirin Synalgos  ASA tablets/Enseals Disalcid Micrainin Tagament  Ascriptin Doan's Midol Talwin  Ascriptin A/D Dolene Mobidin Tanderil  Ascriptin Extra Strength Dolobid Moblgesic Ticlid  Ascriptin with Codeine Doloprin or Doloprin with Codeine  Momentum Tolectin  Asperbuf Duoprin Mono-gesic Trendar  Aspergum Duradyne Motrin or Motrin IB Triminicin  Aspirin plain, buffered or enteric coated Durasal Myochrisine Trigesic  Aspirin Suppositories Easprin Nalfon Trillsate  Aspirin with Codeine Ecotrin Regular or Extra Strength Naprosyn Uracel  Atromid-S Efficin Naproxen Ursinus  Auranofin Capsules Elmiron Neocylate Vanquish  Axotal Emagrin Norgesic Verin  Azathioprine Empirin or Empirin with Codeine Normiflo Vitamin E  Azolid Emprazil Nuprin Voltaren  Bayer Aspirin plain, buffered or children's or timed BC Tablets or powders Encaprin Orgaran Warfarin Sodium  Buff-a-Comp Enoxaparin Orudis Zorpin  Buff-a-Comp with Codeine Equegesic Os-Cal-Gesic   Buffaprin Excedrin plain, buffered or Extra Strength Oxalid   Bufferin Arthritis Strength Feldene Oxphenbutazone   Bufferin plain or Extra Strength Feldene Capsules Oxycodone with Aspirin   Bufferin with Codeine Fenoprofen Fenoprofen Pabalate or Pabalate-SF   Buffets II Flogesic Panagesic   Buffinol plain or Extra Strength Florinal or Florinal with Codeine Panwarfarin   Buf-Tabs Flurbiprofen Penicillamine   Butalbital Compound Four-way cold tablets Penicillin   Butazolidin Fragmin Pepto-Bismol   Carbenicillin Geminisyn Percodan   Carna Arthritis Reliever Geopen Persantine   Carprofen Gold's salt Persistin   Chloramphenicol Goody's Phenylbutazone   Chloromycetin Haltrain Piroxlcam   Clmetidine heparin Plaquenil   Cllnoril Hyco-pap Ponstel   Clofibrate Hydroxy chloroquine Propoxyphen         Before stopping any of  these medications, be sure to consult the physician who ordered them.  Some, such as Coumadin (Warfarin) are ordered to prevent or treat serious conditions such as "deep thrombosis", "pumonary embolisms", and other heart problems.  The amount of time that you may need off of the medication may also vary with the medication and the reason for which you were taking it.  If you are  taking any of these medications, please make sure you notify your pain physician before you undergo any procedures.         GENERAL RISKS AND COMPLICATIONS  What are the risk, side effects and possible complications? Generally speaking, most procedures are safe.  However, with any procedure there are risks, side effects, and the possibility of complications.  The risks and complications are dependent upon the sites that are lesioned, or the type of nerve block to be performed.  The closer the procedure is to the spine, the more serious the risks are.  Great care is taken when placing the radio frequency needles, block needles or lesioning probes, but sometimes complications can occur. 3. Infection: Any time there is an injection through the skin, there is a risk of infection.  This is why sterile conditions are used for these blocks.  There are four possible types of infection. 1. Localized skin infection. 2. Central Nervous System Infection-This can be in the form of Meningitis, which can be deadly. 3. Epidural Infections-This can be in the form of an epidural abscess, which can cause pressure inside of the spine, causing compression of the spinal cord with subsequent paralysis. This would require an emergency surgery to decompress, and there are no guarantees that the patient would recover from the paralysis. 4. Discitis-This is an infection of the intervertebral discs.  It occurs in about 1% of discography procedures.  It is difficult to treat and it may lead to surgery.        2. Pain: the needles have to go through skin and soft tissues, will cause soreness.       3. Damage to internal structures:  The nerves to be lesioned may be near blood vessels or    other nerves which can be potentially damaged.       4. Bleeding: Bleeding is more common if the patient is taking blood thinners such as  aspirin, Coumadin, Ticiid, Plavix, etc., or if he/she have some genetic predisposition  such as  hemophilia. Bleeding into the spinal canal can cause compression of the spinal  cord with subsequent paralysis.  This would require an emergency surgery to  decompress and there are no guarantees that the patient would recover from the  paralysis.       5. Pneumothorax:  Puncturing of a lung is a possibility, every time a needle is introduced in  the area of the chest or upper back.  Pneumothorax refers to free air around the  collapsed lung(s), inside of the thoracic cavity (chest cavity).  Another two possible  complications related to a similar event would include: Hemothorax and Chylothorax.   These are variations of the Pneumothorax, where instead of air around the collapsed  lung(s), you may have blood or chyle, respectively.       6. Spinal headaches: They may occur with any procedures in the area of the spine.       7. Persistent CSF (Cerebro-Spinal Fluid) leakage: This is a rare problem, but may occur  with prolonged intrathecal or epidural catheters either due to the formation  of a fistulous  track or a dural tear.       8. Nerve damage: By working so close to the spinal cord, there is always a possibility of  nerve damage, which could be as serious as a permanent spinal cord injury with  paralysis.       9. Death:  Although rare, severe deadly allergic reactions known as "Anaphylactic  reaction" can occur to any of the medications used.      10. Worsening of the symptoms:  We can always make thing worse.  What are the chances of something like this happening? Chances of any of this occuring are extremely low.  By statistics, you have more of a chance of getting killed in a motor vehicle accident: while driving to the hospital than any of the above occurring .  Nevertheless, you should be aware that they are possibilities.  In general, it is similar to taking a shower.  Everybody knows that you can slip, hit your head and get killed.  Does that mean that you should not shower again?  Nevertheless  always keep in mind that statistics do not mean anything if you happen to be on the wrong side of them.  Even if a procedure has a 1 (one) in a 1,000,000 (million) chance of going wrong, it you happen to be that one..Also, keep in mind that by statistics, you have more of a chance of having something go wrong when taking medications.  Who should not have this procedure? If you are on a blood thinning medication (e.g. Coumadin, Plavix, see list of "Blood Thinners"), or if you have an active infection going on, you should not have the procedure.  If you are taking any blood thinners, please inform your physician.  How should I prepare for this procedure?  Do not eat or drink anything at least six hours prior to the procedure.  Bring a driver with you .  It cannot be a taxi.  Come accompanied by an adult that can drive you back, and that is strong enough to help you if your legs get weak or numb from the local anesthetic.  Take all of your medicines the morning of the procedure with just enough water to swallow them.  If you have diabetes, make sure that you are scheduled to have your procedure done first thing in the morning, whenever possible.  If you have diabetes, take only half of your insulin dose and notify our nurse that you have done so as soon as you arrive at the clinic.  If you are diabetic, but only take blood sugar pills (oral hypoglycemic), then do not take them on the morning of your procedure.  You may take them after you have had the procedure.  Do not take aspirin or any aspirin-containing medications, at least eleven (11) days prior to the procedure.  They may prolong bleeding.  Wear loose fitting clothing that may be easy to take off and that you would not mind if it got stained with Betadine or blood.  Do not wear any jewelry or perfume  Remove any nail coloring.  It will interfere with some of our monitoring equipment.  NOTE: Remember that this is not meant to be  interpreted as a complete list of all possible complications.  Unforeseen problems may occur.  BLOOD THINNERS The following drugs contain aspirin or other products, which can cause increased bleeding during surgery and should not be taken for 2 weeks prior to and  1 week after surgery.  If you should need take something for relief of minor pain, you may take acetaminophen which is found in Tylenol,m Datril, Anacin-3 and Panadol. It is not blood thinner. The products listed below are.  Do not take any of the products listed below in addition to any listed on your instruction sheet.  A.P.C or A.P.C with Codeine Codeine Phosphate Capsules #3 Ibuprofen Ridaura  ABC compound Congesprin Imuran rimadil  Advil Cope Indocin Robaxisal  Alka-Seltzer Effervescent Pain Reliever and Antacid Coricidin or Coricidin-D  Indomethacin Rufen  Alka-Seltzer plus Cold Medicine Cosprin Ketoprofen S-A-C Tablets  Anacin Analgesic Tablets or Capsules Coumadin Korlgesic Salflex  Anacin Extra Strength Analgesic tablets or capsules CP-2 Tablets Lanoril Salicylate  Anaprox Cuprimine Capsules Levenox Salocol  Anexsia-D Dalteparin Magan Salsalate  Anodynos Darvon compound Magnesium Salicylate Sine-off  Ansaid Dasin Capsules Magsal Sodium Salicylate  Anturane Depen Capsules Marnal Soma  APF Arthritis pain formula Dewitt's Pills Measurin Stanback  Argesic Dia-Gesic Meclofenamic Sulfinpyrazone  Arthritis Bayer Timed Release Aspirin Diclofenac Meclomen Sulindac  Arthritis pain formula Anacin Dicumarol Medipren Supac  Analgesic (Safety coated) Arthralgen Diffunasal Mefanamic Suprofen  Arthritis Strength Bufferin Dihydrocodeine Mepro Compound Suprol  Arthropan liquid Dopirydamole Methcarbomol with Aspirin Synalgos  ASA tablets/Enseals Disalcid Micrainin Tagament  Ascriptin Doan's Midol Talwin  Ascriptin A/D Dolene Mobidin Tanderil  Ascriptin Extra Strength Dolobid Moblgesic Ticlid  Ascriptin with Codeine Doloprin or Doloprin  with Codeine Momentum Tolectin  Asperbuf Duoprin Mono-gesic Trendar  Aspergum Duradyne Motrin or Motrin IB Triminicin  Aspirin plain, buffered or enteric coated Durasal Myochrisine Trigesic  Aspirin Suppositories Easprin Nalfon Trillsate  Aspirin with Codeine Ecotrin Regular or Extra Strength Naprosyn Uracel  Atromid-S Efficin Naproxen Ursinus  Auranofin Capsules Elmiron Neocylate Vanquish  Axotal Emagrin Norgesic Verin  Azathioprine Empirin or Empirin with Codeine Normiflo Vitamin E  Azolid Emprazil Nuprin Voltaren  Bayer Aspirin plain, buffered or children's or timed BC Tablets or powders Encaprin Orgaran Warfarin Sodium  Buff-a-Comp Enoxaparin Orudis Zorpin  Buff-a-Comp with Codeine Equegesic Os-Cal-Gesic   Buffaprin Excedrin plain, buffered or Extra Strength Oxalid   Bufferin Arthritis Strength Feldene Oxphenbutazone   Bufferin plain or Extra Strength Feldene Capsules Oxycodone with Aspirin   Bufferin with Codeine Fenoprofen Fenoprofen Pabalate or Pabalate-SF   Buffets II Flogesic Panagesic   Buffinol plain or Extra Strength Florinal or Florinal with Codeine Panwarfarin   Buf-Tabs Flurbiprofen Penicillamine   Butalbital Compound Four-way cold tablets Penicillin   Butazolidin Fragmin Pepto-Bismol   Carbenicillin Geminisyn Percodan   Carna Arthritis Reliever Geopen Persantine   Carprofen Gold's salt Persistin   Chloramphenicol Goody's Phenylbutazone   Chloromycetin Haltrain Piroxlcam   Clmetidine heparin Plaquenil   Cllnoril Hyco-pap Ponstel   Clofibrate Hydroxy chloroquine Propoxyphen         Before stopping any of these medications, be sure to consult the physician who ordered them.  Some, such as Coumadin (Warfarin) are ordered to prevent or treat serious conditions such as "deep thrombosis", "pumonary embolisms", and other heart problems.  The amount of time that you may need off of the medication may also vary with the medication and the reason for which you were taking it.   If you are taking any of these medications, please make sure you notify your pain physician before you undergo any procedures.          Epidural Steroid Injection An epidural steroid injection is a shot of steroid medicine and numbing medicine that is given into the space between the spinal cord and the  bones in your back (epidural space). The shot helps relieve pain caused by an irritated or swollen nerve root. The amount of pain relief you get from the injection depends on what is causing the nerve to be swollen and irritated, and how long your pain lasts. You are more likely to benefit from this injection if your pain is strong and comes on suddenly rather than if you have had pain for a long time. Tell a health care provider about:  Any allergies you have.  All medicines you are taking, including vitamins, herbs, eye drops, creams, and over-the-counter medicines.  Any problems you or family members have had with anesthetic medicines.  Any blood disorders you have.  Any surgeries you have had.  Any medical conditions you have.  Whether you are pregnant or may be pregnant. What are the risks? Generally, this is a safe procedure. However, problems may occur, including:  Headache.  Bleeding.  Infection.  Allergic reaction to medicines.  Damage to your nerves.  What happens before the procedure? Staying hydrated Follow instructions from your health care provider about hydration, which may include:  Up to 2 hours before the procedure - you may continue to drink clear liquids, such as water, clear fruit juice, black coffee, and plain tea.  Eating and drinking restrictions Follow instructions from your health care provider about eating and drinking, which may include:  8 hours before the procedure - stop eating heavy meals or foods such as meat, fried foods, or fatty foods.  6 hours before the procedure - stop eating light meals or foods, such as toast or cereal.  6  hours before the procedure - stop drinking milk or drinks that contain milk.  2 hours before the procedure - stop drinking clear liquids.  Medicine  You may be given medicines to lower anxiety.  Ask your health care provider about: ? Changing or stopping your regular medicines. This is especially important if you are taking diabetes medicines or blood thinners. ? Taking medicines such as aspirin and ibuprofen. These medicines can thin your blood. Do not take these medicines before your procedure if your health care provider instructs you not to. General instructions  Plan to have someone take you home from the hospital or clinic. What happens during the procedure?  You may receive a medicine to help you relax (sedative).  You will be asked to lie on your abdomen.  The injection site will be cleaned.  A numbing medicine (local anesthetic) will be used to numb the injection site.  A needle will be inserted through your skin into the epidural space. You may feel some discomfort when this happens. An X-ray machine will be used to make sure the needle is put as close as possible to the affected nerve.  A steroid medicine and a local anesthetic will be injected into the epidural space.  The needle will be removed.  A bandage (dressing) will be put over the injection site. What happens after the procedure?  Your blood pressure, heart rate, breathing rate, and blood oxygen level will be monitored until the medicines you were given have worn off.  Your arm or leg may feel weak or numb for a few hours.  The injection site may feel sore.  Do not drive for 24 hours if you received a sedative. This information is not intended to replace advice given to you by your health care provider. Make sure you discuss any questions you have with your health care provider.  Document Released: 04/23/2007 Document Revised: 06/28/2015 Document Reviewed: 05/02/2015 Elsevier Interactive Patient Education   2017 [employer].

## 2016-10-29 ENCOUNTER — Other Ambulatory Visit: Payer: Self-pay | Admitting: Nurse Practitioner

## 2016-10-29 ENCOUNTER — Telehealth: Payer: Self-pay

## 2016-10-29 ENCOUNTER — Telehealth: Payer: Self-pay | Admitting: Nurse Practitioner

## 2016-10-29 DIAGNOSIS — G8929 Other chronic pain: Secondary | ICD-10-CM

## 2016-10-29 DIAGNOSIS — E7849 Other hyperlipidemia: Secondary | ICD-10-CM | POA: Diagnosis not present

## 2016-10-29 DIAGNOSIS — F419 Anxiety disorder, unspecified: Secondary | ICD-10-CM | POA: Diagnosis not present

## 2016-10-29 DIAGNOSIS — H81399 Other peripheral vertigo, unspecified ear: Secondary | ICD-10-CM | POA: Diagnosis not present

## 2016-10-29 DIAGNOSIS — Z23 Encounter for immunization: Secondary | ICD-10-CM | POA: Diagnosis not present

## 2016-10-29 DIAGNOSIS — J45901 Unspecified asthma with (acute) exacerbation: Secondary | ICD-10-CM | POA: Diagnosis not present

## 2016-10-29 DIAGNOSIS — M545 Low back pain: Principal | ICD-10-CM

## 2016-10-29 NOTE — Telephone Encounter (Signed)
Had severe attack in her left leg and her foot drew until it looked deformed just a short while earlier. please call her asap

## 2016-10-29 NOTE — Telephone Encounter (Signed)
Patient states that this afternoon that she got a crampy pain in her Left  foot. States that her foot was drawn  For about 25 minutes. Patient took med for restless leg syndrome and seemed to get better. States that she is scheduled for a procedure on Oct 16. Pt had flu shot today. [name] states that she has never had a reaction to flu shot. Was wondering if pain coming from back. Has not had xray of back in a while and wondering if she needs one.

## 2016-10-29 NOTE — Telephone Encounter (Signed)
Images in.

## 2016-10-30 ENCOUNTER — Other Ambulatory Visit: Payer: Self-pay | Admitting: Family Medicine

## 2016-10-30 ENCOUNTER — Ambulatory Visit
Admission: RE | Admit: 2016-10-30 | Discharge: 2016-10-30 | Disposition: A | Discharge status: Home / Self Care | Payer: Medicare Other | Source: Ambulatory Visit | Attending: Nurse Practitioner | Admitting: Nurse Practitioner

## 2016-10-30 DIAGNOSIS — M532X6 Spinal instabilities, lumbar region: Secondary | ICD-10-CM | POA: Insufficient documentation

## 2016-10-30 DIAGNOSIS — M545 Low back pain, unspecified: Secondary | ICD-10-CM

## 2016-10-30 DIAGNOSIS — M438X6 Other specified deforming dorsopathies, lumbar region: Secondary | ICD-10-CM | POA: Insufficient documentation

## 2016-10-30 DIAGNOSIS — M48061 Spinal stenosis, lumbar region without neurogenic claudication: Secondary | ICD-10-CM | POA: Diagnosis not present

## 2016-10-30 DIAGNOSIS — G8929 Other chronic pain: Secondary | ICD-10-CM

## 2016-10-30 NOTE — Telephone Encounter (Signed)
Called pt to inform her that an order for an x ray was put in. Instructions given on where to go have it done. Pt with understanding.

## 2016-11-04 NOTE — Progress Notes (Signed)
Results were reviewed and found to be: abnormal  Further testing may be useful

## 2016-11-05 ENCOUNTER — Other Ambulatory Visit: Payer: Self-pay | Admitting: Family Medicine

## 2016-11-05 DIAGNOSIS — E2839 Other primary ovarian failure: Secondary | ICD-10-CM

## 2016-11-05 DIAGNOSIS — M810 Age-related osteoporosis without current pathological fracture: Secondary | ICD-10-CM

## 2016-11-12 ENCOUNTER — Ambulatory Visit (HOSPITAL_BASED_OUTPATIENT_CLINIC_OR_DEPARTMENT_OTHER): Payer: Medicare Other | Admitting: Pain Medicine

## 2016-11-12 ENCOUNTER — Encounter: Payer: Self-pay | Admitting: Pain Medicine

## 2016-11-12 ENCOUNTER — Ambulatory Visit
Admission: RE | Admit: 2016-11-12 | Discharge: 2016-11-12 | Disposition: A | Discharge status: Home / Self Care | Payer: Medicare Other | Source: Ambulatory Visit | Attending: Pain Medicine | Admitting: Pain Medicine

## 2016-11-12 VITALS — BP 112/55 | HR 61 | Temp 96.6°F | Resp 14 | Ht 61.0 in | Wt 148.0 lb

## 2016-11-12 DIAGNOSIS — M545 Low back pain, unspecified: Secondary | ICD-10-CM

## 2016-11-12 DIAGNOSIS — M47816 Spondylosis without myelopathy or radiculopathy, lumbar region: Secondary | ICD-10-CM | POA: Diagnosis not present

## 2016-11-12 DIAGNOSIS — M5416 Radiculopathy, lumbar region: Secondary | ICD-10-CM

## 2016-11-12 DIAGNOSIS — G8929 Other chronic pain: Secondary | ICD-10-CM | POA: Diagnosis not present

## 2016-11-12 DIAGNOSIS — M47896 Other spondylosis, lumbar region: Secondary | ICD-10-CM | POA: Insufficient documentation

## 2016-11-12 MED ORDER — ROPIVACAINE HCL 2 MG/ML IJ SOLN: 9.0000 mL | Freq: Once | INTRAMUSCULAR | Status: DC

## 2016-11-12 MED ORDER — FENTANYL CITRATE (PF) 100 MCG/2ML IJ SOLN
25.0000 ug | INTRAMUSCULAR | Status: DC | PRN
  Administered 2016-11-12: 100 ug via INTRAVENOUS

## 2016-11-12 MED ORDER — FENTANYL CITRATE (PF) 100 MCG/2ML IJ SOLN
INTRAMUSCULAR | Status: AC
  Filled 2016-11-12: qty 2

## 2016-11-12 MED ORDER — LIDOCAINE HCL 2 % IJ SOLN
INTRAMUSCULAR | Status: AC
  Filled 2016-11-12: qty 20

## 2016-11-12 MED ORDER — TRIAMCINOLONE ACETONIDE 40 MG/ML IJ SUSP
40.0000 mg | Freq: Once | INTRAMUSCULAR | Status: AC
  Administered 2016-11-12: 40 mg

## 2016-11-12 MED ORDER — ROPIVACAINE HCL 2 MG/ML IJ SOLN
9.0000 mL | Freq: Once | INTRAMUSCULAR | Status: AC
  Administered 2016-11-12: 20 mL via PERINEURAL

## 2016-11-12 MED ORDER — ROPIVACAINE HCL 2 MG/ML IJ SOLN
INTRAMUSCULAR | Status: AC
  Filled 2016-11-12: qty 20

## 2016-11-12 MED ORDER — TRIAMCINOLONE ACETONIDE 40 MG/ML IJ SUSP
INTRAMUSCULAR | Status: AC
  Filled 2016-11-12: qty 2

## 2016-11-12 MED ORDER — LIDOCAINE HCL 2 % IJ SOLN
10.0000 mL | Freq: Once | INTRAMUSCULAR | Status: AC
  Administered 2016-11-12: 400 mg

## 2016-11-12 MED ORDER — LACTATED RINGERS IV SOLN
1000.0000 mL | Freq: Once | INTRAVENOUS | Status: AC
  Administered 2016-11-12: 1000 mL via INTRAVENOUS

## 2016-11-12 MED ORDER — MIDAZOLAM HCL 5 MG/5ML IJ SOLN: 1.0000 mg | INTRAMUSCULAR | Status: DC | PRN

## 2016-11-12 NOTE — Progress Notes (Signed)
Safety precautions to be maintained throughout the outpatient stay will include: orient to surroundings, keep bed in low position, maintain call bell within reach at all times, provide assistance with transfer out of bed and ambulation.  

## 2016-11-12 NOTE — Patient Instructions (Addendum)
Pain Management Discharge Instructions  General Discharge Instructions :  If you need to reach your doctor call: Monday-Friday 8:00 am - 4:00 pm at [phone] or toll free [phone].  After clinic hours [phone] to have operator reach doctor.  Bring all of your medication bottles to all your appointments in the pain clinic.  To cancel or reschedule your appointment with Pain Management please remember to call 24 hours in advance to avoid a fee.  Refer to the educational materials which you have been given on: General Risks, I had my Procedure. Discharge Instructions, Post Sedation.  Post Procedure Instructions:  The drugs you were given will stay in your system until tomorrow, so for the next 24 hours you should not drive, make any legal decisions or drink any alcoholic beverages.  You may eat anything you prefer, but it is better to start with liquids then soups and crackers, and gradually work up to solid foods.  Please notify your doctor immediately if you have any unusual bleeding, trouble breathing or pain that is not related to your normal pain.  Depending on the type of procedure that was done, some parts of your body may feel week and/or numb.  This usually clears up by tonight or the next day.  Walk with the use of an assistive device or accompanied by an adult for the 24 hours.  You may use ice on the affected area for the first 24 hours.  Put ice in a Ziploc bag and cover with a towel and place against area 15 minutes on 15 minutes off.  You may switch to heat after 24 hours.Facet Joint Block The facet joints connect the bones of the spine (vertebrae). They make it possible for you to bend, twist, and make other movements with your spine. They also keep you from bending too far, twisting too far, and making other excessive movements. A facet joint block is a procedure where a numbing medicine (anesthetic) is injected into a facet joint. Often, a type of  anti-inflammatory medicine called a steroid is also injected. A facet joint block may be done to diagnose neck or back pain. If the pain gets better after a facet joint block, it means the pain is probably coming from the facet joint. If the pain does not get better, it means the pain is probably not coming from the facet joint. A facet joint block may also be done to relieve neck or back pain caused by an inflamed facet joint. A facet joint block is only done to relieve pain if the pain does not improve with other methods, such as medicine, exercise programs, and physical therapy. Tell a health care provider about:  Any allergies you have.  All medicines you are taking, including vitamins, herbs, eye drops, creams, and over-the-counter medicines.  Any problems you or family members have had with anesthetic medicines.  Any blood disorders you have.  Any surgeries you have had.  Any medical conditions you have.  Whether you are pregnant or may be pregnant. What are the risks? Generally, this is a safe procedure. However, problems may occur, including:  Bleeding.  Injury to a nerve near the injection site.  Pain at the injection site.  Weakness or numbness in areas controlled by nerves near the injection site.  Infection.  Temporary fluid retention.  Allergic reactions to medicines or dyes.  Injury to other structures or organs near the injection site.  What happens before the procedure?  Follow instructions from your health   care provider about eating or drinking restrictions.  Ask your health care provider about: ? Changing or stopping your regular medicines. This is especially important if you are taking diabetes medicines or blood thinners. ? Taking medicines such as aspirin and ibuprofen. These medicines can thin your blood. Do not take these medicines before your procedure if your health care provider instructs you not to.  Do not take any new dietary supplements or  medicines without asking your health care provider first.  Plan to have someone take you home after the procedure. What happens during the procedure?  You may need to remove your clothing and dress in an open-back gown.  The procedure will be done while you are lying on an X-ray table. You will most likely be asked to lie on your stomach, but you may be asked to lie in a different position if an injection will be made in your neck.  Machines will be used to monitor your oxygen levels, heart rate, and blood pressure.  If an injection will be made in your neck, an IV tube will be inserted into one of your veins. Fluids and medicine will flow directly into your body through the IV tube.  The area over the facet joint where the injection will be made will be cleaned with soap. The surrounding skin will be covered with clean drapes.  A numbing medicine (local anesthetic) will be applied to your skin. Your skin may sting or burn for a moment.  A video X-ray machine (fluoroscopy) will be used to locate the joint. In some cases, a CT scan may be used.  A contrast dye may be injected into the facet joint area to help locate the joint.  When the joint is located, an anesthetic will be injected into the joint through the needle.  Your health care provider will ask you whether you feel pain relief. If you do feel relief, a steroid may be injected to provide pain relief for a longer period of time. If you do not feel relief or feel only partial relief, additional injections of an anesthetic may be made in other facet joints.  The needle will be removed.  Your skin will be cleaned.  A bandage (dressing) will be applied over each injection site. The procedure may vary among health care providers and hospitals. What happens after the procedure?  You will be observed for 15-30 minutes before being allowed to go home. This information is not intended to replace advice given to you by your health care  provider. Make sure you discuss any questions you have with your health care provider. Document Released: 06/05/2006 Document Revised: 02/15/2015 Document Reviewed: 10/10/2014 Elsevier Interactive Patient Education  2018 [place] Facet Joint Block, Care After Refer to this sheet in the next few weeks. These instructions provide you with information about caring for yourself after your procedure. Your health care provider may also give you more specific instructions. Your treatment has been planned according to current medical practices, but problems sometimes occur. Call your health care provider if you have any problems or questions after your procedure. What can I expect after the procedure? After the procedure, it is common to have:  Some tenderness over the injection sites for 2 days after the procedure.  A temporary increase in blood sugar if you have diabetes.  Follow these instructions at home:  Keep track of the amount of pain relief you feel and how long it lasts.  Take over-the-counter and prescription medicines  only as told by your health care provider. You may need to limit pain medicine within the first 4-6 hours after the procedure.  Remove your bandages (dressings) the morning after the procedure.  For the first 24 hours after the procedure: ? Do not apply heat near or over the injection sites. ? Do not take a bath or soak in water, such as in a pool or lake. ? Do not drive or operate heavy machinery unless approved by your health care provider. ? Avoid activities that require a lot of energy.  If the injection site is tender, try applying ice to the area. To do this: ? Put ice in a plastic bag. ? Place a towel between your skin and the bag. ? Leave the ice on for 20 minutes, 2-3 times a day.  Keep all follow-up visits as told by your health care provider. This is important. Contact a health care provider if:  Fluid is coming from an injection site.  There is  significant bleeding or swelling at an injection site.  You have diabetes and your blood sugar is above 180 mg/dL. Get help right away if:  You have a fever.  You have worsening pain or swelling around an injection site.  There are red streaks around an injection site.  You develop severe pain that is not controlled by your medicines.  You develop a headache, stiff neck, nausea, or vomiting.  Your eyes become very sensitive to light.  You have weakness, paralysis, or tingling in your arms or legs that was not present before the procedure.  You have difficulty urinating or breathing. This information is not intended to replace advice given to you by your health care provider. Make sure you discuss any questions you have with your health care provider. Document Released: 01/01/2012 Document Revised: 05/31/2015 Document Reviewed: 10/10/2014 Elsevier Interactive Patient Education  2018 [employer]. ____________________________________________________________________________________________  Post-Procedure instructions Instructions:  Apply ice: Fill a plastic sandwich bag with crushed ice. Cover it with a small towel and apply to injection site. Apply for 15 minutes then remove x 15 minutes. Repeat sequence on day of procedure, until you go to bed. The purpose is to minimize swelling and discomfort after procedure.  Apply heat: Apply heat to procedure site starting the day following the procedure. The purpose is to treat any soreness and discomfort from the procedure.  Food intake: Start with clear liquids (like water) and advance to regular food, as tolerated.   Physical activities: Keep activities to a minimum for the first 8 hours after the procedure.   Driving: If you have received any sedation, you are not allowed to drive for 24 hours after your procedure.  Blood thinner: Restart your blood thinner 6 hours after your procedure. (Only for those taking blood  thinners)  Insulin: As soon as you can eat, you may resume your normal dosing schedule. (Only for those taking insulin)  Infection prevention: Keep procedure site clean and dry.  Post-procedure Pain Diary: Extremely important that this be done correctly and accurately. Recorded information will be used to determine the next step in treatment.  Pain evaluated is that of treated area only. Do not include pain from an untreated area.  Complete every hour, on the hour, for the initial 8 hours. Set an alarm to help you do this part accurately.  Do not go to sleep and have it completed later. It will not be accurate.  Follow-up appointment: Keep your follow-up appointment after the procedure. Usually  2 weeks for most procedures. (6 weeks in the case of radiofrequency.) Bring you pain diary.  Expect:  From numbing medicine (AKA: Local Anesthetics): Numbness or decrease in pain.  Onset: Full effect within 15 minutes of injected.  Duration: It will depend on the type of local anesthetic used. On the average, 1 to 8 hours.   From steroids: Decrease in swelling or inflammation. Once inflammation is improved, relief of the pain will follow.  Onset of benefits: Depends on the amount of swelling present. The more swelling, the longer it will take for the benefits to be seen. In some cases, up to 10 days.  Duration: Steroids will stay in the system x 2 weeks. Duration of benefits will depend on multiple posibilities including persistent irritating factors.  From procedure: Some discomfort is to be expected once the numbing medicine wears off. This should be minimal if ice and heat are applied as instructed. Call if:  You experience numbness and weakness that gets worse with time, as opposed to wearing off.  New onset bowel or bladder incontinence. (Spinal procedures only)  Emergency Numbers:  [facility] business hours (Monday - Thursday, 8:00 AM - 4:00 PM) (Friday, 9:00 AM - 12:00 Noon): [phone]  [phone]  After hours: [phone] ____________________________________________________________________________________________

## 2016-11-12 NOTE — Progress Notes (Signed)
Patient's Name: [name]  MRN: [id number]  Referring Provider: Vevelyn Francois, NP  DOB: [birth year]  PCP: Randel Books, FNP  DOS: 11/12/2016  Note by: Gaspar Cola, MD  Service setting: Ambulatory outpatient  Specialty: Interventional Pain Management  Patient type: Established  Location: [facility] (AMB) Pain Management Facility  Visit type: Interventional Procedure   Primary Reason for Visit: Interventional Pain Management Treatment. CC: Back Pain (lower, both sides)  Procedure:  Anesthesia, Analgesia, Anxiolysis:  Type: Diagnostic Medial Branch Facet Block Region: Lumbar Level: L2, L3, L4, L5, & S1 Medial Branch Level(s) Laterality: Bilateral  Type: Local Anesthesia with Moderate (Conscious) Sedation Local Anesthetic: Lidocaine 1% Route: Intravenous (IV) IV Access: Secured Sedation: Meaningful verbal contact was maintained at all times during the procedure  Indication(s): Analgesia and Anxiety   Indications: 1. Lumbar facet syndrome (Primary Source of Pain) (Bilateral) (L>R)   2. Lumbar facet hypertrophy (L4-5)   3. Lumbar facet arthropathy ([place])   4. Lumbar spondylosis   5. Chronic low back pain (Primary Source of Pain) (Bilateral) (L>R)   6. Chronic radicular lumbar pain (Left)    Pain Score: Pre-procedure: 4 /10 Post-procedure: 0-No pain/10  Pre-op Assessment:  [name] is a 71 y.o. (year old), female patient, seen today for interventional treatment. She  has a past surgical history that includes Vaginal hysterectomy (1975); Incontinence surgery (2001); Oophorectomy (1978); Elbow Arthroplasty (1990); Cholecystectomy (1989); Rectocele repair (2009); back implant (2015); Mastectomy partial / lumpectomy (Bilateral, 1978 ); and Augmentation mammaplasty (Bilateral, 1978). [name] has a current medication list which includes the following prescription(s): albuterol, alendronate, alprazolam, aspirin ec, atenolol, calcium, cetirizine, cholecalciferol,  cinnamon, citalopram, conjugated estrogens, fenofibrate, fluticasone, fluticasone-salmeterol, furosemide, gabapentin, meclizine, melatonin, montelukast, omega 3-6-9 complex, omeprazole, oxycodone, oxycodone, oxycodone, ropinirole, tizanidine, and zolpidem, and the following Facility-Administered Medications: fentanyl, midazolam, and ropivacaine (pf) 2 mg/ml (0.2%). Her primarily concern today is the Back Pain (lower, both sides)  Initial Vital Signs: There were no vitals taken for this visit. BMI: Estimated body mass index is 27.96 kg/m as calculated from the following:   Height as of this encounter: 5\' 1"  (1.549 m).   Weight as of this encounter: 148 lb (67.1 kg).  Risk Assessment: Allergies: Reviewed. She is allergic to versed [midazolam]; morphine; niacin and related; and penicillins.  Allergy Precautions: None required Coagulopathies: Reviewed. None identified.  Blood-thinner therapy: None at this time Active Infection(s): Reviewed. None identified. [name] is afebrile  Site Confirmation: [name] was asked to confirm the procedure and laterality before marking the site Procedure checklist: Completed Consent: Before the procedure and under the influence of no sedative(s), amnesic(s), or anxiolytics, the patient was informed of the treatment options, risks and possible complications. To fulfill our ethical and legal obligations, as recommended by the American Medical Association's Code of Ethics, I have informed the patient of my clinical impression; the nature and purpose of the treatment or procedure; the risks, benefits, and possible complications of the intervention; the alternatives, including doing nothing; the risk(s) and benefit(s) of the alternative treatment(s) or procedure(s); and the risk(s) and benefit(s) of doing nothing. The patient was provided information about the general risks and possible complications associated with the procedure. These may include, but are not  limited to: failure to achieve desired goals, infection, bleeding, organ or nerve damage, allergic reactions, paralysis, and death. In addition, the patient was informed of those risks and complications associated to Spine-related procedures, such as failure to decrease pain; infection (i.e.: Meningitis, epidural or intraspinal  abscess); bleeding (i.e.: epidural hematoma, subarachnoid hemorrhage, or any other type of intraspinal or peri-dural bleeding); organ or nerve damage (i.e.: Any type of peripheral nerve, nerve root, or spinal cord injury) with subsequent damage to sensory, motor, and/or autonomic systems, resulting in permanent pain, numbness, and/or weakness of one or several areas of the body; allergic reactions; (i.e.: anaphylactic reaction); and/or death. Furthermore, the patient was informed of those risks and complications associated with the medications. These include, but are not limited to: allergic reactions (i.e.: anaphylactic or anaphylactoid reaction(s)); adrenal axis suppression; blood sugar elevation that in diabetics may result in ketoacidosis or comma; water retention that in patients with history of congestive heart failure may result in shortness of breath, pulmonary edema, and decompensation with resultant heart failure; weight gain; swelling or edema; medication-induced neural toxicity; particulate matter embolism and blood vessel occlusion with resultant organ, and/or nervous system infarction; and/or aseptic necrosis of one or more joints. Finally, the patient was informed that Medicine is not an exact science; therefore, there is also the possibility of unforeseen or unpredictable risks and/or possible complications that may result in a catastrophic outcome. The patient indicated having understood very clearly. We have given the patient no guarantees and we have made no promises. Enough time was given to the patient to ask questions, all of which were answered to the patient's  satisfaction. [name] has indicated that she wanted to continue with the procedure. Attestation: I, the ordering provider, attest that I have discussed with the patient the benefits, risks, side-effects, alternatives, likelihood of achieving goals, and potential problems during recovery for the procedure that I have provided informed consent. Date: 11/12/2016; Time: 7:00 AM  Pre-Procedure Preparation:  Monitoring: As per clinic protocol. Respiration, ETCO2, SpO2, BP, heart rate and rhythm monitor placed and checked for adequate function Safety Precautions: Patient was assessed for positional comfort and pressure points before starting the procedure. Time-out: I initiated and conducted the "Time-out" before starting the procedure, as per protocol. The patient was asked to participate by confirming the accuracy of the "Time Out" information. Verification of the correct person, site, and procedure were performed and confirmed by me, the nursing staff, and the patient. "Time-out" conducted as per Joint Commission's Universal Protocol (UP.01.01.01). "Time-out" Date & Time: 11/12/2016; 0833 hrs.  Description of Procedure Process:   Position: Prone Target Area: For Lumbar Facet blocks, the target is the groove formed by the junction of the transverse process and superior articular process. For the L5 dorsal ramus, the target is the notch between superior articular process and sacral ala. For the S1 dorsal ramus, the target is the superior and lateral edge of the posterior S1 Sacral foramen. Approach: Paramedial approach. Area Prepped: Entire Posterior Lumbosacral Region Prepping solution: ChloraPrep (2% chlorhexidine gluconate and 70% isopropyl alcohol) Safety Precautions: Aspiration looking for blood return was conducted prior to all injections. At no point did we inject any substances, as a needle was being advanced. No attempts were made at seeking any paresthesias. Safe injection practices and  needle disposal techniques used. Medications properly checked for expiration dates. SDV (single dose vial) medications used. Description of the Procedure: Protocol guidelines were followed. The patient was placed in position over the fluoroscopy table. The target area was identified and the area prepped in the usual manner. Skin desensitized using vapocoolant spray. Skin & deeper tissues infiltrated with local anesthetic. Appropriate amount of time allowed to pass for local anesthetics to take effect. The procedure needle was introduced through the skin, ipsilateral  to the reported pain, and advanced to the target area. Employing the "Medial Branch Technique", the needles were advanced to the angle made by the superior and medial portion of the transverse process, and the lateral and inferior portion of the superior articulating process of the targeted vertebral bodies. This area is known as "Burton's Eye" or the "Eye of the [place] Dog". A procedure needle was introduced through the skin, and this time advanced to the angle made by the superior and medial border of the sacral ala, and the lateral border of the S1 vertebral body. This last needle was later repositioned at the superior and lateral border of the posterior S1 foramen. Negative aspiration confirmed. Solution injected in intermittent fashion, asking for systemic symptoms every 0.5cc of injectate. The needles were then removed and the area cleansed, making sure to leave some of the prepping solution back to take advantage of its long term bactericidal properties.   Illustration of the posterior view of the lumbar spine and the posterior neural structures. Laminae of L2 through S1 are labeled. DPRL5, dorsal primary ramus of L5; DPRS1, dorsal primary ramus of S1; DPR3, dorsal primary ramus of L3; FJ, facet (zygapophyseal) joint L3-L4; I, inferior articular process of L4; LB1, lateral branch of dorsal primary ramus of L1; IAB, inferior articular  branches from L3 medial branch (supplies L4-L5 facet joint); IBP, intermediate branch plexus; MB3, medial branch of dorsal primary ramus of L3; NR3, third lumbar nerve root; S, superior articular process of L5; SAB, superior articular branches from L4 (supplies L4-5 facet joint also); TP3, transverse process of L3.  Vitals:   11/12/16 0847 11/12/16 0857 11/12/16 0907 11/12/16 0917  BP: 106/60 138/85 (!) 111/97 (!) 112/55  Pulse:      Resp: 16 15 14 14   Temp:  (!) 96.6 F (35.9 C)    TempSrc:      SpO2: 99% 97% 99% 97%  Weight:      Height:        Start Time: 0834 hrs. End Time: 0845 hrs. Materials:  Needle(s) Type: Regular needle Gauge: 22G Length: 3.5-in Medication(s): We administered lactated ringers, fentaNYL, lidocaine, triamcinolone acetonide, ropivacaine (PF) 2 mg/mL (0.2%), and triamcinolone acetonide. Please see chart orders for dosing details.  Imaging Guidance (Spinal):  Type of Imaging Technique: Fluoroscopy Guidance (Spinal) Indication(s): Assistance in needle guidance and placement for procedures requiring needle placement in or near specific anatomical locations not easily accessible without such assistance. Exposure Time: Please see nurses notes. Contrast: None used. Fluoroscopic Guidance: I was personally present during the use of fluoroscopy. "Tunnel Vision Technique" used to obtain the best possible view of the target area. Parallax error corrected before commencing the procedure. "Direction-depth-direction" technique used to introduce the needle under continuous pulsed fluoroscopy. Once target was reached, antero-posterior, oblique, and lateral fluoroscopic projection used confirm needle placement in all planes. Images permanently stored in EMR. Interpretation: No contrast injected. I personally interpreted the imaging intraoperatively. Adequate needle placement confirmed in multiple planes. Permanent images saved into the patient's record.  Antibiotic Prophylaxis:    Indication(s): None identified Antibiotic given: None  Post-operative Assessment:  EBL: None Complications: No immediate post-treatment complications observed by team, or reported by patient. Note: The patient tolerated the entire procedure well. A repeat set of vitals were taken after the procedure and the patient was kept under observation following institutional policy, for this type of procedure. Post-procedural neurological assessment was performed, showing return to baseline, prior to discharge. The patient was provided with post-procedure discharge  instructions, including a section on how to identify potential problems. Should any problems arise concerning this procedure, the patient was given instructions to immediately contact us, at any time, without hesitation. In any case, we plan to contact the patient by telephone for a follow-up status report regarding this interventional procedure. Comments:  No additional relevant information.  Plan of Care    Imaging Orders     DG C-Arm 1-60 Min-No Report  Procedure Orders     LUMBAR FACET(MEDIAL BRANCH NERVE BLOCK) MBNB  Medications ordered for procedure: Meds ordered this encounter  Medications  . lactated ringers infusion 1,000 mL  . midazolam (VERSED) 5 MG/5ML injection 1-2 mg    Make sure Flumazenil is available in the pyxis when using this medication. If oversedation occurs, administer 0.2 mg IV over 15 sec. If after 45 sec no response, administer 0.2 mg again over 1 min; may repeat at 1 min intervals; not to exceed 4 doses (1 mg)  . fentaNYL (SUBLIMAZE) injection 25-50 mcg    Make sure Narcan is available in the pyxis when using this medication. In the event of respiratory depression (RR< 8/min): Titrate NARCAN (naloxone) in increments of 0.1 to 0.2 mg IV at 2-3 minute intervals, until desired degree of reversal.  . lidocaine (XYLOCAINE) 2 % (with pres) injection 200 mg  . triamcinolone acetonide (KENALOG-40) injection 40 mg  .  ropivacaine (PF) 2 mg/mL (0.2%) (NAROPIN) injection 9 mL  . ropivacaine (PF) 2 mg/mL (0.2%) (NAROPIN) injection 9 mL  . triamcinolone acetonide (KENALOG-40) injection 40 mg   Medications administered: We administered lactated ringers, fentaNYL, lidocaine, triamcinolone acetonide, ropivacaine (PF) 2 mg/mL (0.2%), and triamcinolone acetonide.  See the medical record for exact dosing, route, and time of administration.  New Prescriptions   No medications on file   Disposition: Discharge home  Discharge Date & Time: 11/12/2016; 0919 hrs.   Physician-requested Follow-up: Return for post-procedure eval by Dr. Dossie Arbour in 2 wks. Future Appointments Date Time Provider [place]  11/27/2016 11:15 AM Milinda Pointer, MD [facility] None  02/04/2017 8:30 AM Vevelyn Francois, NP [facility] None   Primary Care Physician: Randel Books, FNP Location: [facility] Note by: Gaspar Cola, MD Date: 11/12/2016; Time: 9:30 AM  Disclaimer:  Medicine is not an Chief Strategy Officer. The only guarantee in medicine is that nothing is guaranteed. It is important to note that the decision to proceed with this intervention was based on the information collected from the patient. The Data and conclusions were drawn from the patient's questionnaire, the interview, and the physical examination. Because the information was provided in large part by the patient, it cannot be guaranteed that it has not been purposely or unconsciously manipulated. Every effort has been made to obtain as much relevant data as possible for this evaluation. It is important to note that the conclusions that lead to this procedure are derived in large part from the available data. Always take into account that the treatment will also be dependent on availability of resources and existing treatment guidelines, considered by other Pain Management Practitioners as being common knowledge and practice, at the time  of the intervention. For Medico-Legal purposes, it is also important to point out that variation in procedural techniques and pharmacological choices are the acceptable norm. The indications, contraindications, technique, and results of the above procedure should only be interpreted and judged by a Board-Certified Interventional Pain Specialist with extensive familiarity and expertise in the same exact procedure and technique.

## 2016-11-13 ENCOUNTER — Telehealth: Payer: Self-pay

## 2016-11-13 NOTE — Telephone Encounter (Signed)
Post procedure phone call.  Patient states she is doing great 
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
## 2016-11-27 ENCOUNTER — Encounter: Payer: Self-pay | Admitting: Pain Medicine

## 2016-11-27 ENCOUNTER — Ambulatory Visit: Payer: Medicare Other | Attending: Pain Medicine | Admitting: Pain Medicine

## 2016-11-27 VITALS — BP 105/89 | HR 59 | Temp 97.6°F | Resp 16 | Ht 61.0 in | Wt 148.0 lb

## 2016-11-27 DIAGNOSIS — Z87440 Personal history of urinary (tract) infections: Secondary | ICD-10-CM | POA: Insufficient documentation

## 2016-11-27 DIAGNOSIS — G2581 Restless legs syndrome: Secondary | ICD-10-CM | POA: Insufficient documentation

## 2016-11-27 DIAGNOSIS — Z885 Allergy status to narcotic agent status: Secondary | ICD-10-CM | POA: Diagnosis not present

## 2016-11-27 DIAGNOSIS — M47816 Spondylosis without myelopathy or radiculopathy, lumbar region: Secondary | ICD-10-CM | POA: Diagnosis not present

## 2016-11-27 DIAGNOSIS — M545 Low back pain: Secondary | ICD-10-CM | POA: Diagnosis not present

## 2016-11-27 DIAGNOSIS — Z90710 Acquired absence of both cervix and uterus: Secondary | ICD-10-CM | POA: Diagnosis not present

## 2016-11-27 DIAGNOSIS — Z803 Family history of malignant neoplasm of breast: Secondary | ICD-10-CM | POA: Insufficient documentation

## 2016-11-27 DIAGNOSIS — M533 Sacrococcygeal disorders, not elsewhere classified: Secondary | ICD-10-CM

## 2016-11-27 DIAGNOSIS — Z79899 Other long term (current) drug therapy: Secondary | ICD-10-CM | POA: Diagnosis not present

## 2016-11-27 DIAGNOSIS — Z888 Allergy status to other drugs, medicaments and biological substances status: Secondary | ICD-10-CM | POA: Diagnosis not present

## 2016-11-27 DIAGNOSIS — Z880 Allergy status to penicillin: Secondary | ICD-10-CM | POA: Diagnosis not present

## 2016-11-27 DIAGNOSIS — Z87891 Personal history of nicotine dependence: Secondary | ICD-10-CM | POA: Insufficient documentation

## 2016-11-27 DIAGNOSIS — G8929 Other chronic pain: Secondary | ICD-10-CM

## 2016-11-27 DIAGNOSIS — M961 Postlaminectomy syndrome, not elsewhere classified: Secondary | ICD-10-CM

## 2016-11-27 DIAGNOSIS — M532X6 Spinal instabilities, lumbar region: Secondary | ICD-10-CM | POA: Insufficient documentation

## 2016-11-27 DIAGNOSIS — M4310 Spondylolisthesis, site unspecified: Secondary | ICD-10-CM

## 2016-11-27 DIAGNOSIS — E785 Hyperlipidemia, unspecified: Secondary | ICD-10-CM | POA: Diagnosis not present

## 2016-11-27 DIAGNOSIS — Z8249 Family history of ischemic heart disease and other diseases of the circulatory system: Secondary | ICD-10-CM | POA: Insufficient documentation

## 2016-11-27 DIAGNOSIS — Z8371 Family history of colonic polyps: Secondary | ICD-10-CM | POA: Insufficient documentation

## 2016-11-27 DIAGNOSIS — I10 Essential (primary) hypertension: Secondary | ICD-10-CM | POA: Diagnosis not present

## 2016-11-27 DIAGNOSIS — G894 Chronic pain syndrome: Secondary | ICD-10-CM | POA: Insufficient documentation

## 2016-11-27 NOTE — Progress Notes (Signed)
Safety precautions to be maintained throughout the outpatient stay will include: orient to surroundings, keep bed in low position, maintain call bell within reach at all times, provide assistance with transfer out of bed and ambulation.  

## 2016-12-10 DIAGNOSIS — G8929 Other chronic pain: Secondary | ICD-10-CM | POA: Diagnosis not present

## 2016-12-10 DIAGNOSIS — M79605 Pain in left leg: Secondary | ICD-10-CM | POA: Diagnosis not present

## 2016-12-10 DIAGNOSIS — M542 Cervicalgia: Secondary | ICD-10-CM | POA: Diagnosis not present

## 2016-12-10 DIAGNOSIS — M4316 Spondylolisthesis, lumbar region: Secondary | ICD-10-CM | POA: Diagnosis not present

## 2016-12-10 DIAGNOSIS — M546 Pain in thoracic spine: Secondary | ICD-10-CM | POA: Diagnosis not present

## 2016-12-10 DIAGNOSIS — S299XXA Unspecified injury of thorax, initial encounter: Secondary | ICD-10-CM | POA: Diagnosis not present

## 2016-12-11 DIAGNOSIS — Z86010 Personal history of colonic polyps: Secondary | ICD-10-CM | POA: Diagnosis not present

## 2016-12-11 DIAGNOSIS — F419 Anxiety disorder, unspecified: Secondary | ICD-10-CM | POA: Diagnosis not present

## 2016-12-11 DIAGNOSIS — N390 Urinary tract infection, site not specified: Secondary | ICD-10-CM | POA: Diagnosis not present

## 2016-12-11 DIAGNOSIS — K219 Gastro-esophageal reflux disease without esophagitis: Secondary | ICD-10-CM | POA: Diagnosis not present

## 2016-12-11 DIAGNOSIS — R197 Diarrhea, unspecified: Secondary | ICD-10-CM | POA: Diagnosis not present

## 2016-12-11 DIAGNOSIS — R1030 Lower abdominal pain, unspecified: Secondary | ICD-10-CM | POA: Diagnosis not present

## 2016-12-12 ENCOUNTER — Other Ambulatory Visit (HOSPITAL_COMMUNITY): Payer: Self-pay | Admitting: Student

## 2016-12-12 ENCOUNTER — Other Ambulatory Visit: Payer: Self-pay | Admitting: Student

## 2016-12-12 DIAGNOSIS — M4316 Spondylolisthesis, lumbar region: Secondary | ICD-10-CM

## 2016-12-12 DIAGNOSIS — M79605 Pain in left leg: Secondary | ICD-10-CM

## 2016-12-17 ENCOUNTER — Ambulatory Visit (HOSPITAL_COMMUNITY)
Admission: RE | Admit: 2016-12-17 | Discharge: 2016-12-17 | Disposition: A | Discharge status: Home / Self Care | Payer: Medicare Other | Source: Ambulatory Visit | Attending: Student | Admitting: Student

## 2016-12-17 DIAGNOSIS — M48061 Spinal stenosis, lumbar region without neurogenic claudication: Secondary | ICD-10-CM | POA: Insufficient documentation

## 2016-12-17 DIAGNOSIS — M4316 Spondylolisthesis, lumbar region: Secondary | ICD-10-CM | POA: Insufficient documentation

## 2016-12-17 DIAGNOSIS — M79605 Pain in left leg: Secondary | ICD-10-CM | POA: Diagnosis not present

## 2016-12-17 DIAGNOSIS — M5137 Other intervertebral disc degeneration, lumbosacral region: Secondary | ICD-10-CM | POA: Diagnosis not present

## 2016-12-17 DIAGNOSIS — Z9689 Presence of other specified functional implants: Secondary | ICD-10-CM | POA: Diagnosis not present

## 2016-12-17 DIAGNOSIS — S3992XA Unspecified injury of lower back, initial encounter: Secondary | ICD-10-CM | POA: Diagnosis not present

## 2017-01-22 ENCOUNTER — Ambulatory Visit (INDEPENDENT_AMBULATORY_CARE_PROVIDER_SITE_OTHER): Payer: Medicare Other | Admitting: Podiatry

## 2017-01-22 ENCOUNTER — Encounter: Payer: Self-pay | Admitting: Podiatry

## 2017-01-22 ENCOUNTER — Other Ambulatory Visit: Payer: Self-pay | Admitting: Nurse Practitioner

## 2017-01-22 ENCOUNTER — Ambulatory Visit (INDEPENDENT_AMBULATORY_CARE_PROVIDER_SITE_OTHER): Payer: Medicare Other

## 2017-01-22 DIAGNOSIS — M659 Synovitis and tenosynovitis, unspecified: Secondary | ICD-10-CM | POA: Diagnosis not present

## 2017-01-22 DIAGNOSIS — M779 Enthesopathy, unspecified: Secondary | ICD-10-CM

## 2017-01-22 DIAGNOSIS — M109 Gout, unspecified: Secondary | ICD-10-CM

## 2017-01-22 DIAGNOSIS — M7918 Myalgia, other site: Secondary | ICD-10-CM

## 2017-01-22 DIAGNOSIS — M62830 Muscle spasm of back: Secondary | ICD-10-CM

## 2017-01-22 DIAGNOSIS — M792 Neuralgia and neuritis, unspecified: Secondary | ICD-10-CM

## 2017-01-22 DIAGNOSIS — M797 Fibromyalgia: Secondary | ICD-10-CM

## 2017-01-22 MED ORDER — COLCHICINE 0.6 MG PO TABS: 0.6000 mg | ORAL_TABLET | Freq: Every day | ORAL | 2 refills | Status: DC

## 2017-01-22 NOTE — Patient Instructions (Signed)

## 2017-01-23 NOTE — Progress Notes (Signed)
Subjective: [name] presents the office today for concerns of left big toe swelling, redness which started on Monday.  She states that on Sunday she was having some mild discomfort however on Monday when she woke up she noticed increasing pain and swelling or redness.  He has gotten somewhat better but it does continue.  She denies any recent injury or trauma.  She does have a history of gout several years ago.  Has no other concerns. Denies any systemic complaints such as fevers, chills, nausea, vomiting. No acute changes since last appointment, and no other complaints at this time.   Objective: AAO x3, NAD DP/PT pulses palpable bilaterally, CRT less than 3 seconds There is edema, erythema to the left first MTPJ there is pain with MPJ range of motion.  There is no open sores identified and there is no ascending cellulitis.  There is no fluctuation or crepitation.  No other area of tenderness identified bilaterally. No open lesions or pre-ulcerative lesions.  No pain with calf compression, swelling, warmth, erythema  Assessment: Capsulitis, likely gout left first MPJ  Plan: -All treatment options discussed with the patient including all alternatives, risks, complications.  -X-rays were obtained and reviewed.  No evidence of acute fracture identified. -Today discussed steroid injection she wished to proceed.  See procedure note below. -Colchicine -We will check uric acid, ESR, CRP -Follow-up in 2 weeks if symptoms continue sooner if any issues are to arise. -Patient encouraged to call the office with any questions, concerns, change in symptoms.   Procedure: Injection small joint Discussed alternatives, risks, complications and verbal consent was obtained.  Location: Left plantar fascia at the glabrous junction; medial approach. Skin Prep: Betadine. Injectate: 1 cc 0.5% marcaine plain, 1 cc kenalog 10. Disposition: Patient tolerated procedure well. Injection site dressed with a band-aid.   Post-injection care was discussed and return precautions discussed.    Trula Slade DPM

## 2017-01-24 ENCOUNTER — Ambulatory Visit: Payer: Medicare Other | Admitting: Podiatry

## 2017-01-25 ENCOUNTER — Other Ambulatory Visit
Admission: RE | Admit: 2017-01-25 | Discharge: 2017-01-25 | Disposition: A | Discharge status: Home / Self Care | Payer: Medicare Other | Source: Ambulatory Visit | Attending: Student | Admitting: Student

## 2017-01-25 DIAGNOSIS — R197 Diarrhea, unspecified: Secondary | ICD-10-CM | POA: Insufficient documentation

## 2017-01-25 LAB — GASTROINTESTINAL PANEL BY PCR, STOOL (REPLACES STOOL CULTURE)

## 2017-01-27 ENCOUNTER — Encounter: Payer: Self-pay | Admitting: *Deleted

## 2017-01-29 ENCOUNTER — Encounter
Admission: RE | Disposition: A | Discharge status: Home / Self Care | Payer: Self-pay | Source: Ambulatory Visit | Attending: Unknown Physician Specialty

## 2017-01-29 ENCOUNTER — Ambulatory Visit: Payer: Medicare Other | Admitting: Anesthesiology

## 2017-01-29 ENCOUNTER — Ambulatory Visit
Admission: RE | Admit: 2017-01-29 | Discharge: 2017-01-29 | Disposition: A | Discharge status: Home / Self Care | Payer: Medicare Other | Source: Ambulatory Visit | Attending: Unknown Physician Specialty | Admitting: Unknown Physician Specialty

## 2017-01-29 ENCOUNTER — Encounter: Payer: Self-pay | Admitting: Anesthesiology

## 2017-01-29 DIAGNOSIS — E785 Hyperlipidemia, unspecified: Secondary | ICD-10-CM | POA: Insufficient documentation

## 2017-01-29 DIAGNOSIS — Z79890 Hormone replacement therapy (postmenopausal): Secondary | ICD-10-CM | POA: Diagnosis not present

## 2017-01-29 DIAGNOSIS — Z1211 Encounter for screening for malignant neoplasm of colon: Secondary | ICD-10-CM | POA: Diagnosis not present

## 2017-01-29 DIAGNOSIS — F329 Major depressive disorder, single episode, unspecified: Secondary | ICD-10-CM | POA: Insufficient documentation

## 2017-01-29 DIAGNOSIS — I10 Essential (primary) hypertension: Secondary | ICD-10-CM | POA: Diagnosis not present

## 2017-01-29 DIAGNOSIS — Z885 Allergy status to narcotic agent status: Secondary | ICD-10-CM | POA: Diagnosis not present

## 2017-01-29 DIAGNOSIS — Z79899 Other long term (current) drug therapy: Secondary | ICD-10-CM | POA: Insufficient documentation

## 2017-01-29 DIAGNOSIS — Z7951 Long term (current) use of inhaled steroids: Secondary | ICD-10-CM | POA: Diagnosis not present

## 2017-01-29 DIAGNOSIS — K219 Gastro-esophageal reflux disease without esophagitis: Secondary | ICD-10-CM | POA: Diagnosis not present

## 2017-01-29 DIAGNOSIS — F418 Other specified anxiety disorders: Secondary | ICD-10-CM | POA: Diagnosis not present

## 2017-01-29 DIAGNOSIS — K640 First degree hemorrhoids: Secondary | ICD-10-CM | POA: Diagnosis not present

## 2017-01-29 DIAGNOSIS — J449 Chronic obstructive pulmonary disease, unspecified: Secondary | ICD-10-CM | POA: Diagnosis not present

## 2017-01-29 DIAGNOSIS — K635 Polyp of colon: Secondary | ICD-10-CM | POA: Diagnosis not present

## 2017-01-29 DIAGNOSIS — F419 Anxiety disorder, unspecified: Secondary | ICD-10-CM | POA: Insufficient documentation

## 2017-01-29 DIAGNOSIS — Z7982 Long term (current) use of aspirin: Secondary | ICD-10-CM | POA: Diagnosis not present

## 2017-01-29 DIAGNOSIS — Z87891 Personal history of nicotine dependence: Secondary | ICD-10-CM | POA: Diagnosis not present

## 2017-01-29 DIAGNOSIS — Z888 Allergy status to other drugs, medicaments and biological substances status: Secondary | ICD-10-CM | POA: Diagnosis not present

## 2017-01-29 DIAGNOSIS — D122 Benign neoplasm of ascending colon: Secondary | ICD-10-CM | POA: Insufficient documentation

## 2017-01-29 DIAGNOSIS — K449 Diaphragmatic hernia without obstruction or gangrene: Secondary | ICD-10-CM | POA: Insufficient documentation

## 2017-01-29 DIAGNOSIS — K648 Other hemorrhoids: Secondary | ICD-10-CM | POA: Diagnosis not present

## 2017-01-29 DIAGNOSIS — Z86010 Personal history of colonic polyps: Secondary | ICD-10-CM | POA: Insufficient documentation

## 2017-01-29 DIAGNOSIS — K5730 Diverticulosis of large intestine without perforation or abscess without bleeding: Secondary | ICD-10-CM | POA: Insufficient documentation

## 2017-01-29 DIAGNOSIS — G2581 Restless legs syndrome: Secondary | ICD-10-CM | POA: Insufficient documentation

## 2017-01-29 HISTORY — DX: Major depressive disorder, single episode, unspecified: F32.9

## 2017-01-29 HISTORY — DX: Headache: R51

## 2017-01-29 HISTORY — DX: Headache, unspecified: R51.9

## 2017-01-29 HISTORY — DX: Personal history of other diseases of the digestive system: Z87.19

## 2017-01-29 HISTORY — DX: Depression, unspecified: F32.A

## 2017-01-29 HISTORY — PX: COLONOSCOPY WITH PROPOFOL: SHX5780

## 2017-01-29 HISTORY — DX: Gastro-esophageal reflux disease without esophagitis: K21.9

## 2017-01-29 HISTORY — DX: Anxiety disorder, unspecified: F41.9

## 2017-01-29 SURGERY — COLONOSCOPY WITH PROPOFOL
Anesthesia: General

## 2017-01-29 MED ORDER — SODIUM CHLORIDE 0.9 % IV SOLN
INTRAVENOUS | Status: DC
  Administered 2017-01-29: 14:00:00 via INTRAVENOUS

## 2017-01-29 MED ORDER — GLYCOPYRROLATE 0.2 MG/ML IJ SOLN
INTRAMUSCULAR | Status: DC | PRN
  Administered 2017-01-29: 0.1 mg via INTRAVENOUS

## 2017-01-29 MED ORDER — LIDOCAINE HCL (CARDIAC) 20 MG/ML IV SOLN
INTRAVENOUS | Status: DC | PRN
  Administered 2017-01-29: 60 mg via INTRAVENOUS

## 2017-01-29 MED ORDER — GLYCOPYRROLATE 0.2 MG/ML IJ SOLN
INTRAMUSCULAR | Status: AC
  Filled 2017-01-29: qty 1

## 2017-01-29 MED ORDER — PROPOFOL 500 MG/50ML IV EMUL
INTRAVENOUS | Status: AC
  Filled 2017-01-29: qty 50

## 2017-01-29 MED ORDER — LIDOCAINE HCL (PF) 2 % IJ SOLN
INTRAMUSCULAR | Status: AC
  Filled 2017-01-29: qty 10

## 2017-01-29 MED ORDER — PROPOFOL 500 MG/50ML IV EMUL
INTRAVENOUS | Status: DC | PRN
  Administered 2017-01-29: 120 ug/kg/min via INTRAVENOUS

## 2017-01-29 MED ORDER — SODIUM CHLORIDE 0.9 % IV SOLN: INTRAVENOUS | Status: DC

## 2017-01-29 MED ORDER — PROPOFOL 10 MG/ML IV BOLUS
INTRAVENOUS | Status: DC | PRN
  Administered 2017-01-29: 20 mg via INTRAVENOUS
  Administered 2017-01-29: 70 mg via INTRAVENOUS
  Administered 2017-01-29: 50 mg via INTRAVENOUS
  Administered 2017-01-29: 30 mg via INTRAVENOUS

## 2017-01-29 NOTE — Anesthesia Preprocedure Evaluation (Signed)
Anesthesia Evaluation  Patient identified by MRN, date of birth, ID band Patient awake    Reviewed: Allergy & Precautions, NPO status , Patient's Chart, lab work & pertinent test results, reviewed documented beta blocker date and time   Airway Mallampati: II  TM Distance: >3 FB     Dental  (+) Chipped   Pulmonary asthma , COPD, former smoker,           Cardiovascular hypertension, Pt. on medications and Pt. on home beta blockers      Neuro/Psych  Headaches, PSYCHIATRIC DISORDERS Anxiety Depression  Neuromuscular disease    GI/Hepatic hiatal hernia, GERD  Controlled,  Endo/Other    Renal/GU      Musculoskeletal  (+) Arthritis , Fibromyalgia -  Abdominal   Peds  Hematology   Anesthesia Other Findings Spinal stimulator has been turned off. RLS. Does not have a pacemaker. Allergic to versed., morphine. Opiate use.  Reproductive/Obstetrics                             Anesthesia Physical Anesthesia Plan  ASA: III  Anesthesia Plan: General   Post-op Pain Management:    Induction: Intravenous  PONV Risk Score and Plan:   Airway Management Planned:   Additional Equipment:   Intra-op Plan:   Post-operative Plan:   Informed Consent: I have reviewed the patients History and Physical, chart, labs and discussed the procedure including the risks, benefits and alternatives for the proposed anesthesia with the patient or authorized representative who has indicated his/her understanding and acceptance.     Plan Discussed with: CRNA  Anesthesia Plan Comments:         Anesthesia Quick Evaluation

## 2017-01-29 NOTE — Op Note (Signed)
[facility] Patient Name: [name] Procedure Date: 01/29/2017 2:09 PM MRN: [id number] Account #: [id number] Date of Birth: 28-Nov-1945 Admit Type: Outpatient Age: 72 Room: [facility] ENDO ROOM 3 Gender: Female Note Status: Finalized Procedure:            Colonoscopy Indications:          High risk colon cancer surveillance: Personal history                        of colonic polyps Providers:            Manya Silvas, MD Referring MD:         Delray Alt. Willett (Referring MD) Medicines:            Propofol per Anesthesia Complications:        No immediate complications. Procedure:            Pre-Anesthesia Assessment:                       - After reviewing the risks and benefits, the patient                        was deemed in satisfactory condition to undergo the                        procedure.                       After obtaining informed consent, the colonoscope was                        passed under direct vision. Throughout the procedure,                        the patient's blood pressure, pulse, and oxygen                        saturations were monitored continuously. The                        Colonoscope was introduced through the anus and                        advanced to the the cecum, identified by appendiceal                        orifice and ileocecal valve. The colonoscopy was                        somewhat difficult due to a tortuous colon. Successful                        completion of the procedure was aided by applying                        abdominal pressure. The patient tolerated the procedure                        well. The quality of the bowel preparation was good. Findings:      A diminutive polyp was found in the proximal ascending colon. The  polyp       was sessile. The polyp was removed with a jumbo cold forceps. Resection       and retrieval were complete.      Internal hemorrhoids were found during  endoscopy. The hemorrhoids were       small and Grade I (internal hemorrhoids that do not prolapse).      Many small-mouthed diverticula were found in the sigmoid colon.      The exam was otherwise without abnormality. Impression:           - One diminutive polyp in the proximal ascending colon,                        removed with a jumbo cold forceps. Resected and                        retrieved.                       - Internal hemorrhoids.                       - Diverticulosis in the sigmoid colon.                       - The examination was otherwise normal. Recommendation:       - Await pathology results. Manya Silvas, MD 01/29/2017 2:55:36 PM This report has been signed electronically. Number of Addenda: 0 Note Initiated On: 01/29/2017 2:09 PM Scope Withdrawal Time: 0 hours 8 minutes 0 seconds  Total Procedure Duration: 0 hours 26 minutes 33 seconds       [facility]
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
## 2017-01-29 NOTE — Anesthesia Post-op Follow-up Note (Signed)
Anesthesia QCDR form completed.        

## 2017-01-29 NOTE — Transfer of Care (Signed)
Immediate Anesthesia Transfer of Care Note  Patient: [name]  Procedure(s) Performed: COLONOSCOPY WITH PROPOFOL (N/A )  Patient Location: PACU  Anesthesia Type:General  Level of Consciousness: sedated  Airway & Oxygen Therapy: Patient Spontanous Breathing and Patient connected to nasal cannula oxygen  Post-op Assessment: Report given to RN and Post -op Vital signs reviewed and stable  Post vital signs: Reviewed and stable  Last Vitals:  Vitals:   01/29/17 1331 01/29/17 1456  BP: (!) 146/67 (!) 101/54  Pulse: 63 69  Resp: 16 17  Temp: (!) 36.2 C (!) 35.9 C  SpO2: 100% 100%    Last Pain:  Vitals:   01/29/17 1456  TempSrc: Tympanic         Complications: No apparent anesthesia complications

## 2017-01-30 ENCOUNTER — Encounter: Payer: Self-pay | Admitting: Unknown Physician Specialty

## 2017-01-30 ENCOUNTER — Telehealth: Payer: Self-pay | Admitting: Nurse Practitioner

## 2017-01-30 DIAGNOSIS — G2581 Restless legs syndrome: Secondary | ICD-10-CM | POA: Diagnosis not present

## 2017-01-30 DIAGNOSIS — G4700 Insomnia, unspecified: Secondary | ICD-10-CM | POA: Diagnosis not present

## 2017-01-30 DIAGNOSIS — R197 Diarrhea, unspecified: Secondary | ICD-10-CM | POA: Diagnosis not present

## 2017-01-30 DIAGNOSIS — F419 Anxiety disorder, unspecified: Secondary | ICD-10-CM | POA: Diagnosis not present

## 2017-01-30 NOTE — Telephone Encounter (Signed)
[facility] called asking for a refill on Tizanidine per patient request. She has med mgmt appt on 02-04-17

## 2017-01-30 NOTE — Anesthesia Postprocedure Evaluation (Signed)
Anesthesia Post Note  Patient: [name]  Procedure(s) Performed: COLONOSCOPY WITH PROPOFOL (N/A )  Patient location during evaluation: Endoscopy Anesthesia Type: General Level of consciousness: awake and alert Pain management: pain level controlled Vital Signs Assessment: post-procedure vital signs reviewed and stable Respiratory status: spontaneous breathing, nonlabored ventilation, respiratory function stable and patient connected to nasal cannula oxygen Cardiovascular status: blood pressure returned to baseline and stable Postop Assessment: no apparent nausea or vomiting Anesthetic complications: no     Last Vitals:  Vitals:   01/29/17 1506 01/29/17 1516  BP: 127/67 135/64  Pulse: 61 (!) 53  Resp: (!) 22 13  Temp:    SpO2: 100% 100%    Last Pain:  Vitals:   01/30/17 0732  TempSrc:   PainSc: 0-No pain                 Aashir Umholtz S

## 2017-01-31 LAB — SURGICAL PATHOLOGY

## 2017-02-03 ENCOUNTER — Ambulatory Visit
Admission: RE | Admit: 2017-02-03 | Discharge: 2017-02-03 | Disposition: A | Discharge status: Home / Self Care | Payer: Medicare Other | Source: Ambulatory Visit | Attending: Family Medicine | Admitting: Family Medicine

## 2017-02-03 DIAGNOSIS — M109 Gout, unspecified: Secondary | ICD-10-CM | POA: Diagnosis not present

## 2017-02-03 DIAGNOSIS — K219 Gastro-esophageal reflux disease without esophagitis: Secondary | ICD-10-CM | POA: Insufficient documentation

## 2017-02-03 DIAGNOSIS — E785 Hyperlipidemia, unspecified: Secondary | ICD-10-CM | POA: Insufficient documentation

## 2017-02-03 DIAGNOSIS — M792 Neuralgia and neuritis, unspecified: Secondary | ICD-10-CM | POA: Insufficient documentation

## 2017-02-03 DIAGNOSIS — G2581 Restless legs syndrome: Secondary | ICD-10-CM | POA: Diagnosis not present

## 2017-02-03 DIAGNOSIS — Z886 Allergy status to analgesic agent status: Secondary | ICD-10-CM | POA: Insufficient documentation

## 2017-02-03 DIAGNOSIS — M545 Low back pain: Secondary | ICD-10-CM | POA: Insufficient documentation

## 2017-02-03 DIAGNOSIS — Z880 Allergy status to penicillin: Secondary | ICD-10-CM | POA: Insufficient documentation

## 2017-02-03 DIAGNOSIS — Z950 Presence of cardiac pacemaker: Secondary | ICD-10-CM | POA: Insufficient documentation

## 2017-02-03 DIAGNOSIS — Z90710 Acquired absence of both cervix and uterus: Secondary | ICD-10-CM | POA: Diagnosis not present

## 2017-02-03 DIAGNOSIS — M62830 Muscle spasm of back: Secondary | ICD-10-CM | POA: Insufficient documentation

## 2017-02-03 DIAGNOSIS — M47816 Spondylosis without myelopathy or radiculopathy, lumbar region: Secondary | ICD-10-CM | POA: Diagnosis not present

## 2017-02-03 DIAGNOSIS — M533 Sacrococcygeal disorders, not elsewhere classified: Secondary | ICD-10-CM | POA: Insufficient documentation

## 2017-02-03 DIAGNOSIS — Z9010 Acquired absence of unspecified breast and nipple: Secondary | ICD-10-CM | POA: Diagnosis not present

## 2017-02-03 DIAGNOSIS — Z8249 Family history of ischemic heart disease and other diseases of the circulatory system: Secondary | ICD-10-CM | POA: Insufficient documentation

## 2017-02-03 DIAGNOSIS — M797 Fibromyalgia: Secondary | ICD-10-CM | POA: Insufficient documentation

## 2017-02-03 DIAGNOSIS — Z885 Allergy status to narcotic agent status: Secondary | ICD-10-CM | POA: Insufficient documentation

## 2017-02-03 DIAGNOSIS — Z888 Allergy status to other drugs, medicaments and biological substances status: Secondary | ICD-10-CM | POA: Insufficient documentation

## 2017-02-03 DIAGNOSIS — Z803 Family history of malignant neoplasm of breast: Secondary | ICD-10-CM | POA: Insufficient documentation

## 2017-02-03 DIAGNOSIS — M25552 Pain in left hip: Secondary | ICD-10-CM | POA: Diagnosis not present

## 2017-02-03 DIAGNOSIS — E2839 Other primary ovarian failure: Secondary | ICD-10-CM | POA: Diagnosis not present

## 2017-02-03 DIAGNOSIS — I10 Essential (primary) hypertension: Secondary | ICD-10-CM | POA: Insufficient documentation

## 2017-02-03 DIAGNOSIS — M25551 Pain in right hip: Secondary | ICD-10-CM | POA: Insufficient documentation

## 2017-02-03 DIAGNOSIS — Z9049 Acquired absence of other specified parts of digestive tract: Secondary | ICD-10-CM | POA: Insufficient documentation

## 2017-02-03 DIAGNOSIS — M818 Other osteoporosis without current pathological fracture: Secondary | ICD-10-CM | POA: Diagnosis not present

## 2017-02-03 DIAGNOSIS — G894 Chronic pain syndrome: Secondary | ICD-10-CM | POA: Diagnosis not present

## 2017-02-03 DIAGNOSIS — M810 Age-related osteoporosis without current pathological fracture: Secondary | ICD-10-CM | POA: Diagnosis not present

## 2017-02-03 DIAGNOSIS — Z87891 Personal history of nicotine dependence: Secondary | ICD-10-CM | POA: Diagnosis not present

## 2017-02-03 DIAGNOSIS — Z79899 Other long term (current) drug therapy: Secondary | ICD-10-CM | POA: Insufficient documentation

## 2017-02-03 DIAGNOSIS — Z9889 Other specified postprocedural states: Secondary | ICD-10-CM | POA: Insufficient documentation

## 2017-02-03 DIAGNOSIS — Z8371 Family history of colonic polyps: Secondary | ICD-10-CM | POA: Insufficient documentation

## 2017-02-03 DIAGNOSIS — Z7982 Long term (current) use of aspirin: Secondary | ICD-10-CM | POA: Insufficient documentation

## 2017-02-04 ENCOUNTER — Encounter: Payer: Self-pay | Admitting: Nurse Practitioner

## 2017-02-04 ENCOUNTER — Other Ambulatory Visit: Payer: Self-pay

## 2017-02-04 ENCOUNTER — Ambulatory Visit (HOSPITAL_BASED_OUTPATIENT_CLINIC_OR_DEPARTMENT_OTHER): Payer: Medicare Other | Admitting: Nurse Practitioner

## 2017-02-04 VITALS — BP 115/76 | HR 63 | Temp 97.9°F | Resp 16 | Ht 61.0 in | Wt 150.0 lb

## 2017-02-04 DIAGNOSIS — M533 Sacrococcygeal disorders, not elsewhere classified: Secondary | ICD-10-CM

## 2017-02-04 DIAGNOSIS — M25552 Pain in left hip: Secondary | ICD-10-CM

## 2017-02-04 DIAGNOSIS — M792 Neuralgia and neuritis, unspecified: Secondary | ICD-10-CM | POA: Diagnosis not present

## 2017-02-04 DIAGNOSIS — M7918 Myalgia, other site: Secondary | ICD-10-CM

## 2017-02-04 DIAGNOSIS — M47816 Spondylosis without myelopathy or radiculopathy, lumbar region: Secondary | ICD-10-CM

## 2017-02-04 DIAGNOSIS — G8929 Other chronic pain: Secondary | ICD-10-CM

## 2017-02-04 DIAGNOSIS — M62830 Muscle spasm of back: Secondary | ICD-10-CM

## 2017-02-04 DIAGNOSIS — M545 Low back pain: Secondary | ICD-10-CM

## 2017-02-04 DIAGNOSIS — M25551 Pain in right hip: Secondary | ICD-10-CM

## 2017-02-04 DIAGNOSIS — G894 Chronic pain syndrome: Secondary | ICD-10-CM | POA: Diagnosis not present

## 2017-02-04 DIAGNOSIS — M797 Fibromyalgia: Secondary | ICD-10-CM | POA: Diagnosis not present

## 2017-02-04 MED ORDER — OXYCODONE HCL 5 MG PO TABS: 5.0000 mg | ORAL_TABLET | Freq: Four times a day (QID) | ORAL | 0 refills | Status: DC | PRN

## 2017-02-04 MED ORDER — TIZANIDINE HCL 4 MG PO TABS: 4.0000 mg | ORAL_TABLET | Freq: Three times a day (TID) | ORAL | 0 refills | Status: DC

## 2017-02-04 MED ORDER — GABAPENTIN 300 MG PO CAPS: 300.0000 mg | ORAL_CAPSULE | Freq: Two times a day (BID) | ORAL | 2 refills | Status: DC

## 2017-02-04 MED ORDER — GABAPENTIN 300 MG PO CAPS: 300.0000 mg | ORAL_CAPSULE | Freq: Two times a day (BID) | ORAL | 0 refills | Status: DC

## 2017-02-04 NOTE — Progress Notes (Signed)
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 our practice, on "Automatic Refill". Patient may have prescription filled one day early if pharmacy is closed on scheduled refill date.    Order Specific Question:   Supervising Provider    Answer:   Milinda Pointer [phone]  . tiZANidine (ZANAFLEX) 4 MG tablet    Sig: Take 1 tablet (4 mg total) by mouth 3 (three) times daily.    Dispense:  270 tablet    Refill:  0    Do not add this medication to the electronic "Automatic Refill" notification system. Patient may have prescription filled one day early if pharmacy is closed on scheduled refill date.    Order Specific Question:   Supervising Provider    Answer:   Milinda Pointer [phone]  . gabapentin (NEURONTIN) 300 MG capsule    Sig: Take 1-2 capsules (300-600 mg total) by mouth 2 (two) times daily. Follow titration schedule.    Dispense:  360 capsule    Refill:  0    Do  not place this medication, or any other prescription from our practice, on "Automatic Refill". Patient may have prescription filled one day early if pharmacy is closed on scheduled refill date.    Order Specific Question:   Supervising Provider    Answer:   Milinda Pointer [[id number]]  This SmartLink is deprecated. Use AVSMEDLIST instead to display the medication list for a patient. Medications administered today: [name] had no medications administered during this visit. Lab-work, procedure(s), and/or referral(s): No orders of the defined types were placed in this encounter.  Imaging and/or referral(s): None  Interventional therapies: Planned, scheduled, and/or pending:  Not at this time. Increased the Oxycodone frequency and will have her to follow up.    Considering:  Diagnostic bilateral intra-articular hip injection. Bilateral lumbar facet radiofrequencyablation.  Palliative bilateral lumbar facet block. Diagnostic left sacroiliac joint block. Palliative L1-2 vs L4-4 LESI. Palliative bilateral L4-5 TFESI.   Palliative PRN treatment(s):  Diagnostic bilateral intra-articular hip injection. Bilateral lumbar facet radiofrequencyablation.  Palliative bilateral lumbar facet block. Diagnostic left sacroiliac joint block. Palliative L1-2 vs L4-4 LESI. Palliative bilateral L4-5 TFESI.   Provider-requested follow-up:  Return in about 3 months (around 05/05/2017) for MedMgmt with [facility]).  Future Appointments  Date Time Provider [place]  02/07/2017  7:45 AM Trula Slade, DPM [facility]  05/05/2017  8:30 AM Vevelyn Francois, NP [facility] None   Primary Care Physician: Randel Books, FNP Location: [facility] Note by: Vevelyn Francois NP Date: 02/04/2017; Time: 2:36 PM  Pain Score Disclaimer: We use the NRS-11 scale. This is a self-reported, subjective measurement of pain severity with only modest  accuracy. It is used primarily to identify changes within a particular patient. It must be understood that outpatient pain scales are significantly less accurate that those used for research, where they can be applied under ideal controlled circumstances with minimal exposure to variables. In reality, the score is likely to be a combination of pain intensity and pain affect, where pain affect describes the degree of emotional arousal or changes in action readiness caused by the sensory experience of pain. Factors such as social and work situation, setting, emotional state, anxiety levels, expectation, and prior pain experience may influence pain perception and show large inter-individual differences that may also be affected by time variables.  Patient instructions provided during this appointment: Patient Instructions   ____________________________________________________________________________________________  Medication Rules  Applies to: All patients receiving prescriptions (written or electronic).  Pharmacy of record: Pharmacy where electronic prescriptions will be sent. If written prescriptions are taken to a different pharmacy, please inform the nursing staff. The pharmacy listed in the electronic medical record should be the one where you would like electronic prescriptions to be sent.  Prescription refills: Only during scheduled appointments. Applies to both, written and electronic prescriptions.  NOTE: The following applies primarily to controlled substances (Opioid* Pain Medications).   Patient's responsibilities: 1. Pain Pills: Bring all pain pills to every appointment (except for procedure appointments). 2. Pill Bottles: Bring pills in original pharmacy bottle. Always bring newest bottle. Bring bottle, even if empty. 3. Medication refills: You are responsible for knowing and keeping track of what medications you need refilled. The day before your appointment, write a list of all  prescriptions that need to be refilled. Bring that list to your appointment and give it to the admitting nurse. Prescriptions will be written only during appointments. If you forget a medication, it will not be "Called in", "Faxed", or "electronically sent". You will need to get another appointment to get these prescribed. 4. Prescription Accuracy: You are responsible for carefully inspecting your prescriptions before leaving our office. Have the discharge nurse carefully go over each prescription with you, before taking them home. Make sure that your name is accurately spelled, that your address is correct. Check the name and dose of your medication to make sure it is accurate. Check the number of pills, and the written instructions to make sure they are clear and accurate. Make sure that you are given enough medication to last until your next medication refill appointment. 5. Taking Medication: Take medication as prescribed. Never take more pills than instructed. Never take medication more frequently than prescribed. Taking less pills or less frequently is permitted and encouraged, when it comes to controlled substances (written prescriptions).  6. Inform other Doctors: Always inform, all of your healthcare providers, of all the medications you take. 7. Pain Medication from other Providers: You are not allowed to accept any additional pain medication from any other Doctor or Healthcare provider. There are two exceptions to this rule. (see below) In the event  that you require additional pain medication, you are responsible for notifying us, as stated below. 8. Medication Agreement: You are responsible for carefully reading and following our Medication Agreement. This must be signed before receiving any prescriptions from our practice. Safely store a copy of your signed Agreement. Violations to the Agreement will result in no further prescriptions. (Additional copies of our Medication Agreement are available  upon request.) 9. Laws, Rules, & Regulations: All patients are expected to follow all Federal and [employer], Rules, [employer]. Ignorance of the Laws does not constitute a valid excuse. The use of any illegal substances is prohibited. 10. Adopted CDC guidelines & recommendations: Target dosing levels will be at or below 60 MME/day. Use of benzodiazepines** is not recommended.  Exceptions: There are only two exceptions to the rule of not receiving pain medications from other Healthcare Providers. 1. Exception #1 (Emergencies): In the event of an emergency (i.e.: accident requiring emergency care), you are allowed to receive additional pain medication. However, you are responsible for: As soon as you are able, call our office [phone], at any time of the day or night, and leave a message stating your name, the date and nature of the emergency, and the name and dose of the medication prescribed. In the event that your call is answered by a member of our staff, make sure to document and save the date, time, and the name of the person that took your information.  2. Exception #2 (Planned Surgery): In the event that you are scheduled by another doctor or dentist to have any type of surgery or procedure, you are allowed (for a period no longer than 30 days), to receive additional pain medication, for the acute post-op pain. However, in this case, you are responsible for picking up a copy of our "Post-op Pain Management for Surgeons" handout, and giving it to your surgeon or dentist. This document is available at our office, and does not require an appointment to obtain it. Simply go to our office during business hours (Monday-Thursday from 8:00 AM to 4:00 PM) (Friday 8:00 AM to 12:00 Noon) or if you have a scheduled appointment with [place], prior to your surgery, and ask for it by name. In addition, you will need to provide [place] with your name, name of your surgeon, type of surgery, and date of  procedure or surgery.  *Opioid medications include: morphine, codeine, oxycodone, oxymorphone, hydrocodone, hydromorphone, meperidine, tramadol, tapentadol, buprenorphine, fentanyl, methadone. **Benzodiazepine medications include: diazepam (Valium), alprazolam (Xanax), clonazepam (Klonopine), lorazepam (Ativan), clorazepate (Tranxene), chlordiazepoxide (Librium), estazolam (Prosom), oxazepam (Serax), temazepam (Restoril), triazolam (Halcion)  ____________________________________________________________________________________________

## 2017-02-04 NOTE — Patient Instructions (Signed)

## 2017-02-04 NOTE — Progress Notes (Signed)
Nursing Pain Medication Assessment:  Safety precautions to be maintained throughout the outpatient stay will include: orient to surroundings, keep bed in low position, maintain call bell within reach at all times, provide assistance with transfer out of bed and ambulation.  Medication Inspection Compliance: Pill count conducted under aseptic conditions, in front of the patient. Neither the pills nor the bottle was removed from the patient's sight at any time. Once count was completed pills were immediately returned to the patient in their original bottle.  Medication: See above Pill/Patch Count: 89 of 90 pills remain Pill/Patch Appearance: Markings consistent with prescribed medication Bottle Appearance: Standard pharmacy container. Clearly labeled. Filled Date: 1 / 5 / 2018 Last Medication intake:  Yesterday

## 2017-02-07 ENCOUNTER — Ambulatory Visit: Payer: Medicare Other | Admitting: Podiatry

## 2017-02-14 ENCOUNTER — Telehealth: Payer: Self-pay

## 2017-02-14 NOTE — Telephone Encounter (Signed)
Called and clarified that we do not prescribe Ambien for this patient.  They have already resolved the issue.

## 2017-02-21 DIAGNOSIS — H2513 Age-related nuclear cataract, bilateral: Secondary | ICD-10-CM | POA: Diagnosis not present

## 2017-02-21 DIAGNOSIS — H353131 Nonexudative age-related macular degeneration, bilateral, early dry stage: Secondary | ICD-10-CM | POA: Diagnosis not present

## 2017-02-25 DIAGNOSIS — G4700 Insomnia, unspecified: Secondary | ICD-10-CM | POA: Diagnosis not present

## 2017-02-25 DIAGNOSIS — M810 Age-related osteoporosis without current pathological fracture: Secondary | ICD-10-CM | POA: Diagnosis not present

## 2017-02-25 DIAGNOSIS — J45901 Unspecified asthma with (acute) exacerbation: Secondary | ICD-10-CM | POA: Diagnosis not present

## 2017-02-25 DIAGNOSIS — F419 Anxiety disorder, unspecified: Secondary | ICD-10-CM | POA: Diagnosis not present

## 2017-02-25 DIAGNOSIS — E7849 Other hyperlipidemia: Secondary | ICD-10-CM | POA: Diagnosis not present

## 2017-02-25 DIAGNOSIS — J302 Other seasonal allergic rhinitis: Secondary | ICD-10-CM | POA: Diagnosis not present

## 2017-03-14 DIAGNOSIS — S9032XA Contusion of left foot, initial encounter: Secondary | ICD-10-CM | POA: Diagnosis not present

## 2017-03-14 DIAGNOSIS — R05 Cough: Secondary | ICD-10-CM | POA: Diagnosis not present

## 2017-03-14 DIAGNOSIS — J069 Acute upper respiratory infection, unspecified: Secondary | ICD-10-CM | POA: Diagnosis not present

## 2017-03-25 ENCOUNTER — Other Ambulatory Visit: Payer: Self-pay | Admitting: Nurse Practitioner

## 2017-03-25 DIAGNOSIS — M797 Fibromyalgia: Secondary | ICD-10-CM

## 2017-03-25 DIAGNOSIS — M792 Neuralgia and neuritis, unspecified: Secondary | ICD-10-CM

## 2017-04-14 DIAGNOSIS — E039 Hypothyroidism, unspecified: Secondary | ICD-10-CM | POA: Diagnosis not present

## 2017-04-14 DIAGNOSIS — I10 Essential (primary) hypertension: Secondary | ICD-10-CM | POA: Diagnosis not present

## 2017-04-14 DIAGNOSIS — E7849 Other hyperlipidemia: Secondary | ICD-10-CM | POA: Diagnosis not present

## 2017-04-14 DIAGNOSIS — R350 Frequency of micturition: Secondary | ICD-10-CM | POA: Diagnosis not present

## 2017-04-16 DIAGNOSIS — J208 Acute bronchitis due to other specified organisms: Secondary | ICD-10-CM | POA: Diagnosis not present

## 2017-04-16 DIAGNOSIS — F411 Generalized anxiety disorder: Secondary | ICD-10-CM | POA: Diagnosis not present

## 2017-04-16 DIAGNOSIS — I10 Essential (primary) hypertension: Secondary | ICD-10-CM | POA: Diagnosis not present

## 2017-04-16 DIAGNOSIS — G2581 Restless legs syndrome: Secondary | ICD-10-CM | POA: Diagnosis not present

## 2017-04-16 DIAGNOSIS — E7849 Other hyperlipidemia: Secondary | ICD-10-CM | POA: Diagnosis not present

## 2017-04-21 ENCOUNTER — Other Ambulatory Visit: Payer: Self-pay | Admitting: Family Medicine

## 2017-04-21 DIAGNOSIS — Z1231 Encounter for screening mammogram for malignant neoplasm of breast: Secondary | ICD-10-CM

## 2017-05-05 ENCOUNTER — Encounter: Payer: Self-pay | Admitting: Nurse Practitioner

## 2017-05-05 ENCOUNTER — Ambulatory Visit: Payer: Medicare Other | Attending: Nurse Practitioner | Admitting: Nurse Practitioner

## 2017-05-05 VITALS — BP 121/74 | HR 64 | Temp 97.9°F | Resp 16 | Ht 61.0 in | Wt 155.0 lb

## 2017-05-05 DIAGNOSIS — Z880 Allergy status to penicillin: Secondary | ICD-10-CM | POA: Insufficient documentation

## 2017-05-05 DIAGNOSIS — Z79899 Other long term (current) drug therapy: Secondary | ICD-10-CM | POA: Insufficient documentation

## 2017-05-05 DIAGNOSIS — G8929 Other chronic pain: Secondary | ICD-10-CM | POA: Diagnosis not present

## 2017-05-05 DIAGNOSIS — K219 Gastro-esophageal reflux disease without esophagitis: Secondary | ICD-10-CM | POA: Diagnosis not present

## 2017-05-05 DIAGNOSIS — G894 Chronic pain syndrome: Secondary | ICD-10-CM | POA: Diagnosis not present

## 2017-05-05 DIAGNOSIS — M109 Gout, unspecified: Secondary | ICD-10-CM | POA: Insufficient documentation

## 2017-05-05 DIAGNOSIS — G4700 Insomnia, unspecified: Secondary | ICD-10-CM | POA: Insufficient documentation

## 2017-05-05 DIAGNOSIS — I10 Essential (primary) hypertension: Secondary | ICD-10-CM | POA: Insufficient documentation

## 2017-05-05 DIAGNOSIS — Z9049 Acquired absence of other specified parts of digestive tract: Secondary | ICD-10-CM | POA: Insufficient documentation

## 2017-05-05 DIAGNOSIS — Z79891 Long term (current) use of opiate analgesic: Secondary | ICD-10-CM

## 2017-05-05 DIAGNOSIS — F411 Generalized anxiety disorder: Secondary | ICD-10-CM | POA: Diagnosis not present

## 2017-05-05 DIAGNOSIS — Z87891 Personal history of nicotine dependence: Secondary | ICD-10-CM | POA: Insufficient documentation

## 2017-05-05 DIAGNOSIS — F329 Major depressive disorder, single episode, unspecified: Secondary | ICD-10-CM | POA: Insufficient documentation

## 2017-05-05 DIAGNOSIS — Z885 Allergy status to narcotic agent status: Secondary | ICD-10-CM | POA: Insufficient documentation

## 2017-05-05 DIAGNOSIS — Z803 Family history of malignant neoplasm of breast: Secondary | ICD-10-CM | POA: Insufficient documentation

## 2017-05-05 DIAGNOSIS — G8918 Other acute postprocedural pain: Secondary | ICD-10-CM | POA: Insufficient documentation

## 2017-05-05 DIAGNOSIS — Z90710 Acquired absence of both cervix and uterus: Secondary | ICD-10-CM | POA: Insufficient documentation

## 2017-05-05 DIAGNOSIS — M792 Neuralgia and neuritis, unspecified: Secondary | ICD-10-CM | POA: Diagnosis not present

## 2017-05-05 DIAGNOSIS — E669 Obesity, unspecified: Secondary | ICD-10-CM | POA: Insufficient documentation

## 2017-05-05 DIAGNOSIS — J449 Chronic obstructive pulmonary disease, unspecified: Secondary | ICD-10-CM | POA: Insufficient documentation

## 2017-05-05 DIAGNOSIS — Z7982 Long term (current) use of aspirin: Secondary | ICD-10-CM | POA: Insufficient documentation

## 2017-05-05 DIAGNOSIS — G2581 Restless legs syndrome: Secondary | ICD-10-CM | POA: Insufficient documentation

## 2017-05-05 DIAGNOSIS — M797 Fibromyalgia: Secondary | ICD-10-CM | POA: Diagnosis not present

## 2017-05-05 DIAGNOSIS — M48061 Spinal stenosis, lumbar region without neurogenic claudication: Secondary | ICD-10-CM | POA: Insufficient documentation

## 2017-05-05 DIAGNOSIS — K589 Irritable bowel syndrome without diarrhea: Secondary | ICD-10-CM | POA: Diagnosis not present

## 2017-05-05 DIAGNOSIS — Z8249 Family history of ischemic heart disease and other diseases of the circulatory system: Secondary | ICD-10-CM | POA: Insufficient documentation

## 2017-05-05 DIAGNOSIS — M47816 Spondylosis without myelopathy or radiculopathy, lumbar region: Secondary | ICD-10-CM | POA: Diagnosis not present

## 2017-05-05 DIAGNOSIS — K449 Diaphragmatic hernia without obstruction or gangrene: Secondary | ICD-10-CM | POA: Diagnosis not present

## 2017-05-05 DIAGNOSIS — M961 Postlaminectomy syndrome, not elsewhere classified: Secondary | ICD-10-CM | POA: Diagnosis not present

## 2017-05-05 DIAGNOSIS — M62830 Muscle spasm of back: Secondary | ICD-10-CM | POA: Diagnosis not present

## 2017-05-05 DIAGNOSIS — M25551 Pain in right hip: Secondary | ICD-10-CM | POA: Diagnosis not present

## 2017-05-05 DIAGNOSIS — E785 Hyperlipidemia, unspecified: Secondary | ICD-10-CM | POA: Insufficient documentation

## 2017-05-05 DIAGNOSIS — R5382 Chronic fatigue, unspecified: Secondary | ICD-10-CM | POA: Insufficient documentation

## 2017-05-05 DIAGNOSIS — Z9889 Other specified postprocedural states: Secondary | ICD-10-CM | POA: Insufficient documentation

## 2017-05-05 DIAGNOSIS — M25552 Pain in left hip: Secondary | ICD-10-CM | POA: Diagnosis not present

## 2017-05-05 DIAGNOSIS — M7918 Myalgia, other site: Secondary | ICD-10-CM | POA: Diagnosis not present

## 2017-05-05 DIAGNOSIS — M533 Sacrococcygeal disorders, not elsewhere classified: Secondary | ICD-10-CM | POA: Insufficient documentation

## 2017-05-05 DIAGNOSIS — Z9013 Acquired absence of bilateral breasts and nipples: Secondary | ICD-10-CM | POA: Insufficient documentation

## 2017-05-05 DIAGNOSIS — Z950 Presence of cardiac pacemaker: Secondary | ICD-10-CM | POA: Insufficient documentation

## 2017-05-05 MED ORDER — OXYCODONE HCL 5 MG PO TABS: 5.0000 mg | ORAL_TABLET | Freq: Four times a day (QID) | ORAL | 0 refills | Status: DC | PRN

## 2017-05-05 MED ORDER — TIZANIDINE HCL 4 MG PO TABS: 4.0000 mg | ORAL_TABLET | Freq: Three times a day (TID) | ORAL | 1 refills | Status: AC

## 2017-05-05 MED ORDER — GABAPENTIN 300 MG PO CAPS: 300.0000 mg | ORAL_CAPSULE | Freq: Two times a day (BID) | ORAL | 1 refills | Status: DC

## 2017-05-05 NOTE — Progress Notes (Signed)
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 Taking Medication: Take medication as prescribed. Never take more pills than instructed. Never take medication more frequently than prescribed. Taking less pills or less frequently is permitted and encouraged, when it comes to controlled substances (written prescriptions).  6. Inform other Doctors: Always inform, all of your healthcare providers, of all the medications you take. 7. Pain Medication from other Providers: You are not allowed to accept any additional pain medication from any other Doctor or Healthcare provider. There are two exceptions to this rule. (see below) In the event that you require additional pain medication, you are responsible for notifying us, as stated below. 8. Medication Agreement: You are responsible for carefully reading and following our Medication Agreement. This must be signed before receiving any prescriptions from our practice. Safely store a copy of your  signed Agreement. Violations to the Agreement will result in no further prescriptions. (Additional copies of our Medication Agreement are available upon request.) 9. Laws, Rules, & Regulations: All patients are expected to follow all Federal and [employer], [employer], Rules, [employer]. Ignorance of the Laws does not constitute a valid excuse. The use of any illegal substances is prohibited. 10. Adopted CDC guidelines & recommendations: Target dosing levels will be at or below 60 MME/day. Use of benzodiazepines** is not recommended.  Exceptions: There are only two exceptions to the rule of not receiving pain medications from other Healthcare Providers. 1. Exception #1 (Emergencies): In the event of an emergency (i.e.: accident requiring emergency care), you are allowed to receive additional pain medication. However, you are responsible for: As soon as you are able, call our office [phone], at any time of the day or night, and leave a message stating your name, the date and nature of the emergency, and the name and dose of the medication prescribed. In the event that your call is answered by a member of our staff, make sure to document and save the date, time, and the name of the person that took your information.  2. Exception #2 (Planned Surgery): In the event that you are scheduled by another doctor or dentist to have any type of surgery or procedure, you are allowed (for a period no  longer than 30 days), to receive additional pain medication, for the acute post-op pain. However, in this case, you are responsible for picking up a copy of our "Post-op Pain Management for Surgeons" handout, and giving it to your surgeon or dentist. This document is available at our office, and does not require an appointment to obtain it. Simply go to our office during business hours (Monday-Thursday from 8:00 AM to 4:00 PM) (Friday 8:00 AM to 12:00 Noon) or if you have a scheduled appointment with [place], prior to your  surgery, and ask for it by name. In addition, you will need to provide [place] with your name, name of your surgeon, type of surgery, and date of procedure or surgery.  *Opioid medications include: morphine, codeine, oxycodone, oxymorphone, hydrocodone, hydromorphone, meperidine, tramadol, tapentadol, buprenorphine, fentanyl, methadone. **Benzodiazepine medications include: diazepam (Valium), alprazolam (Xanax), clonazepam (Klonopine), lorazepam (Ativan), clorazepate (Tranxene), chlordiazepoxide (Librium), estazolam (Prosom), oxazepam (Serax), temazepam (Restoril), triazolam (Halcion) (Last updated: 03/27/2017) ____________________________________________________________________________________________   BMI Assessment: Estimated body mass index is 29.29 kg/m as calculated from the following:   Height as of this encounter: _0  (1.549 m).   Weight as of this encounter: 155 lb (70.3 kg).  BMI interpretation table: BMI level Category Range association with higher incidence of chronic pain  <18 kg/m2 Underweight   18.5-24.9 kg/m2 Ideal body weight   25-29.9 kg/m2 Overweight Increased incidence by 20%  30-34.9 kg/m2 Obese (Class I) Increased incidence by 68%  35-39.9 kg/m2 Severe obesity (Class II) Increased incidence by 136%  >40 kg/m2 Extreme obesity (Class III) Increased incidence by 254%   BMI Readings from Last 4 Encounters:  05/05/17 29.29 kg/m  02/04/17 28.34 kg/m  01/29/17 28.34 kg/m  11/27/16 27.96 kg/m   Wt Readings from Last 4 Encounters:  05/05/17 155 lb (70.3 kg)  02/04/17 150 lb (68 kg)  01/29/17 150 lb (68 kg)  11/27/16 148 lb (67.1 kg)

## 2017-05-05 NOTE — Patient Instructions (Addendum)
____________________________You have been given 3 prescriptiions for oxycocone today. ________________________________________________________________  Medication Rules  Applies to: All patients receiving prescriptions (written or electronic).  Pharmacy of record: Pharmacy where electronic prescriptions will be sent. If written prescriptions are taken to a different pharmacy, please inform the nursing staff. The pharmacy listed in the electronic medical record should be the one where you would like electronic prescriptions to be sent.  Prescription refills: Only during scheduled appointments. Applies to both, written and electronic prescriptions.  NOTE: The following applies primarily to controlled substances (Opioid* Pain Medications).   Patient's responsibilities: 1. Pain Pills: Bring all pain pills to every appointment (except for procedure appointments). 2. Pill Bottles: Bring pills in original pharmacy bottle. Always bring newest bottle. Bring bottle, even if empty. 3. Medication refills: You are responsible for knowing and keeping track of what medications you need refilled. The day before your appointment, write a list of all prescriptions that need to be refilled. Bring that list to your appointment and give it to the admitting nurse. Prescriptions will be written only during appointments. If you forget a medication, it will not be "Called in", "Faxed", or "electronically sent". You will need to get another appointment to get these prescribed. 4. Prescription Accuracy: You are responsible for carefully inspecting your prescriptions before leaving our office. Have the discharge nurse carefully go over each prescription with you, before taking them home. Make sure that your name is accurately spelled, that your address is correct. Check the name and dose of your medication to make sure it is accurate. Check the number of pills, and the written instructions to make sure they are clear and  accurate. Make sure that you are given enough medication to last until your next medication refill appointment. 5. Taking Medication: Take medication as prescribed. Never take more pills than instructed. Never take medication more frequently than prescribed. Taking less pills or less frequently is permitted and encouraged, when it comes to controlled substances (written prescriptions).  6. Inform other Doctors: Always inform, all of your healthcare providers, of all the medications you take. 7. Pain Medication from other Providers: You are not allowed to accept any additional pain medication from any other Doctor or Healthcare provider. There are two exceptions to this rule. (see below) In the event that you require additional pain medication, you are responsible for notifying us, as stated below. 8. Medication Agreement: You are responsible for carefully reading and following our Medication Agreement. This must be signed before receiving any prescriptions from our practice. Safely store a copy of your signed Agreement. Violations to the Agreement will result in no further prescriptions. (Additional copies of our Medication Agreement are available upon request.) 9. Laws, Rules, & Regulations: All patients are expected to follow all Federal and [employer], [employer], Rules, [employer]. Ignorance of the Laws does not constitute a valid excuse. The use of any illegal substances is prohibited. 10. Adopted CDC guidelines & recommendations: Target dosing levels will be at or below 60 MME/day. Use of benzodiazepines** is not recommended.  Exceptions: There are only two exceptions to the rule of not receiving pain medications from other Healthcare Providers. 1. Exception #1 (Emergencies): In the event of an emergency (i.e.: accident requiring emergency care), you are allowed to receive additional pain medication. However, you are responsible for: As soon as you are able, call our office [phone], at any  time of the day or night, and leave a message stating your name, the date and nature of the  emergency, and the name and dose of the medication prescribed. In the event that your call is answered by a member of our staff, make sure to document and save the date, time, and the name of the person that took your information.  2. Exception #2 (Planned Surgery): In the event that you are scheduled by another doctor or dentist to have any type of surgery or procedure, you are allowed (for a period no longer than 30 days), to receive additional pain medication, for the acute post-op pain. However, in this case, you are responsible for picking up a copy of our "Post-op Pain Management for Surgeons" handout, and giving it to your surgeon or dentist. This document is available at our office, and does not require an appointment to obtain it. Simply go to our office during business hours (Monday-Thursday from 8:00 AM to 4:00 PM) (Friday 8:00 AM to 12:00 Noon) or if you have a scheduled appointment with [place], prior to your surgery, and ask for it by name. In addition, you will need to provide [place] with your name, name of your surgeon, type of surgery, and date of procedure or surgery.  *Opioid medications include: morphine, codeine, oxycodone, oxymorphone, hydrocodone, hydromorphone, meperidine, tramadol, tapentadol, buprenorphine, fentanyl, methadone. **Benzodiazepine medications include: diazepam (Valium), alprazolam (Xanax), clonazepam (Klonopine), lorazepam (Ativan), clorazepate (Tranxene), chlordiazepoxide (Librium), estazolam (Prosom), oxazepam (Serax), temazepam (Restoril), triazolam (Halcion) (Last updated: 03/27/2017) ____________________________________________________________________________________________   BMI Assessment: Estimated body mass index is 29.29 kg/m as calculated from the following:   Height as of this encounter: 5\' 1"  (1.549 m).   Weight as of this encounter: 155 lb (70.3 kg).  BMI  interpretation table: BMI level Category Range association with higher incidence of chronic pain  <18 kg/m2 Underweight   18.5-24.9 kg/m2 Ideal body weight   25-29.9 kg/m2 Overweight Increased incidence by 20%  30-34.9 kg/m2 Obese (Class I) Increased incidence by 68%  35-39.9 kg/m2 Severe obesity (Class II) Increased incidence by 136%  >40 kg/m2 Extreme obesity (Class III) Increased incidence by 254%   BMI Readings from Last 4 Encounters:  05/05/17 29.29 kg/m  02/04/17 28.34 kg/m  01/29/17 28.34 kg/m  11/27/16 27.96 kg/m   Wt Readings from Last 4 Encounters:  05/05/17 155 lb (70.3 kg)  02/04/17 150 lb (68 kg)  01/29/17 150 lb (68 kg)  11/27/16 148 lb (67.1 kg)

## 2017-05-05 NOTE — Progress Notes (Signed)
Nursing Pain Medication Assessment:  Safety precautions to be maintained throughout the outpatient stay will include: orient to surroundings, keep bed in low position, maintain call bell within reach at all times, provide assistance with transfer out of bed and ambulation.  Medication Inspection Compliance: Pill count conducted under aseptic conditions, in front of the patient. Neither the pills nor the bottle was removed from the patient's sight at any time. Once count was completed pills were immediately returned to the patient in their original bottle.  Medication: Oxycodone IR Pill/Patch Count: 111 of 120 pills remain Pill/Patch Appearance: Markings consistent with prescribed medication Bottle Appearance: Standard pharmacy container. Clearly labeled. Filled Date: 04 / 04 / 2019 Last Medication intake:  Today

## 2017-05-11 LAB — TOXASSURE SELECT 13 (MW), URINE

## 2017-05-12 ENCOUNTER — Other Ambulatory Visit: Payer: Self-pay | Admitting: Nurse Practitioner

## 2017-05-12 ENCOUNTER — Telehealth: Payer: Self-pay

## 2017-05-12 DIAGNOSIS — M47816 Spondylosis without myelopathy or radiculopathy, lumbar region: Secondary | ICD-10-CM

## 2017-05-12 DIAGNOSIS — G8929 Other chronic pain: Secondary | ICD-10-CM

## 2017-05-12 DIAGNOSIS — M7918 Myalgia, other site: Secondary | ICD-10-CM

## 2017-05-12 DIAGNOSIS — M48061 Spinal stenosis, lumbar region without neurogenic claudication: Secondary | ICD-10-CM

## 2017-05-12 DIAGNOSIS — M79605 Pain in left leg: Secondary | ICD-10-CM

## 2017-05-12 DIAGNOSIS — M797 Fibromyalgia: Secondary | ICD-10-CM

## 2017-05-12 DIAGNOSIS — M792 Neuralgia and neuritis, unspecified: Secondary | ICD-10-CM

## 2017-05-12 MED ORDER — GABAPENTIN 300 MG PO CAPS: 300.0000 mg | ORAL_CAPSULE | Freq: Two times a day (BID) | ORAL | 0 refills | Status: DC

## 2017-05-12 NOTE — Telephone Encounter (Signed)
Orders in for L-TFESI  Trigger point of upper back Gabapentin sent to [facility] (#120)

## 2017-05-12 NOTE — Telephone Encounter (Signed)
Please call patient to schedule.

## 2017-05-12 NOTE — Telephone Encounter (Signed)
Pain in left and middle back at bra area. Pain goes down back of left leg to the foot. Causes foot to "draw and twist." Also has not received Gabapentin in the mail and is completely out. Asking if you will send a short supply of Gabapentin to [facility]. If you do, let me know before. Here preferred [facility] in the system is [facility]

## 2017-05-12 NOTE — Telephone Encounter (Signed)
Scheduled for 05/20/17

## 2017-05-12 NOTE — Telephone Encounter (Signed)
She is having pain on both sides more so on the left. Can she get a procedure?  Also she is having trouble getting her gabapentin at the pharmacy, she has had to cut back to 1 a day instead of 4 a day. The pharmacy is saying the instructions are unclear.  If you can call [facility] in [place] to clarify on the instructions for gabapentin.

## 2017-05-15 ENCOUNTER — Other Ambulatory Visit: Payer: Self-pay

## 2017-05-15 ENCOUNTER — Encounter: Payer: Self-pay | Admitting: Pain Medicine

## 2017-05-15 ENCOUNTER — Ambulatory Visit
Admission: RE | Admit: 2017-05-15 | Discharge: 2017-05-15 | Disposition: A | Discharge status: Home / Self Care | Payer: Medicare Other | Source: Ambulatory Visit | Attending: Pain Medicine | Admitting: Pain Medicine

## 2017-05-15 ENCOUNTER — Ambulatory Visit (HOSPITAL_BASED_OUTPATIENT_CLINIC_OR_DEPARTMENT_OTHER): Payer: Medicare Other | Admitting: Pain Medicine

## 2017-05-15 VITALS — BP 125/60 | HR 64 | Temp 97.7°F | Resp 18 | Ht 61.0 in | Wt 156.0 lb

## 2017-05-15 DIAGNOSIS — G8929 Other chronic pain: Secondary | ICD-10-CM | POA: Diagnosis not present

## 2017-05-15 DIAGNOSIS — M5416 Radiculopathy, lumbar region: Secondary | ICD-10-CM

## 2017-05-15 DIAGNOSIS — Z880 Allergy status to penicillin: Secondary | ICD-10-CM | POA: Diagnosis not present

## 2017-05-15 DIAGNOSIS — Z79891 Long term (current) use of opiate analgesic: Secondary | ICD-10-CM | POA: Insufficient documentation

## 2017-05-15 DIAGNOSIS — Z885 Allergy status to narcotic agent status: Secondary | ICD-10-CM | POA: Insufficient documentation

## 2017-05-15 DIAGNOSIS — M79605 Pain in left leg: Secondary | ICD-10-CM

## 2017-05-15 DIAGNOSIS — M961 Postlaminectomy syndrome, not elsewhere classified: Secondary | ICD-10-CM | POA: Diagnosis not present

## 2017-05-15 DIAGNOSIS — M545 Low back pain: Secondary | ICD-10-CM | POA: Diagnosis present

## 2017-05-15 DIAGNOSIS — Z888 Allergy status to other drugs, medicaments and biological substances status: Secondary | ICD-10-CM | POA: Diagnosis not present

## 2017-05-15 DIAGNOSIS — M5116 Intervertebral disc disorders with radiculopathy, lumbar region: Secondary | ICD-10-CM | POA: Diagnosis not present

## 2017-05-15 DIAGNOSIS — Z9049 Acquired absence of other specified parts of digestive tract: Secondary | ICD-10-CM | POA: Insufficient documentation

## 2017-05-15 DIAGNOSIS — M5136 Other intervertebral disc degeneration, lumbar region: Secondary | ICD-10-CM

## 2017-05-15 DIAGNOSIS — Z90710 Acquired absence of both cervix and uterus: Secondary | ICD-10-CM | POA: Insufficient documentation

## 2017-05-15 DIAGNOSIS — M48061 Spinal stenosis, lumbar region without neurogenic claudication: Secondary | ICD-10-CM

## 2017-05-15 DIAGNOSIS — M48062 Spinal stenosis, lumbar region with neurogenic claudication: Secondary | ICD-10-CM | POA: Diagnosis not present

## 2017-05-15 DIAGNOSIS — Z7951 Long term (current) use of inhaled steroids: Secondary | ICD-10-CM | POA: Insufficient documentation

## 2017-05-15 DIAGNOSIS — Z79899 Other long term (current) drug therapy: Secondary | ICD-10-CM | POA: Insufficient documentation

## 2017-05-15 MED ORDER — DEXAMETHASONE SODIUM PHOSPHATE 10 MG/ML IJ SOLN
10.0000 mg | Freq: Once | INTRAMUSCULAR | Status: AC
  Administered 2017-05-15: 10 mg
  Filled 2017-05-15: qty 1

## 2017-05-15 MED ORDER — ROPIVACAINE HCL 2 MG/ML IJ SOLN
2.0000 mL | Freq: Once | INTRAMUSCULAR | Status: AC
Start: 2017-05-15 — End: 2017-05-15
  Administered 2017-05-15: 2 mL via EPIDURAL
  Filled 2017-05-15: qty 10

## 2017-05-15 MED ORDER — LIDOCAINE HCL 2 % IJ SOLN
20.0000 mL | Freq: Once | INTRAMUSCULAR | Status: AC
  Administered 2017-05-15: 400 mg
  Filled 2017-05-15: qty 40

## 2017-05-15 MED ORDER — SODIUM CHLORIDE 0.9% FLUSH
2.0000 mL | Freq: Once | INTRAVENOUS | Status: AC
  Administered 2017-05-15: 2 mL

## 2017-05-15 MED ORDER — SODIUM CHLORIDE 0.9% FLUSH
1.0000 mL | Freq: Once | INTRAVENOUS | Status: AC
  Administered 2017-05-15: 1 mL

## 2017-05-15 MED ORDER — ROPIVACAINE HCL 2 MG/ML IJ SOLN
1.0000 mL | Freq: Once | INTRAMUSCULAR | Status: AC
  Administered 2017-05-15: 10 mL via EPIDURAL
  Filled 2017-05-15: qty 10

## 2017-05-15 MED ORDER — FENTANYL CITRATE (PF) 100 MCG/2ML IJ SOLN
25.0000 ug | INTRAMUSCULAR | Status: DC | PRN
  Administered 2017-05-15: 100 ug via INTRAVENOUS
  Filled 2017-05-15: qty 2

## 2017-05-15 MED ORDER — TRIAMCINOLONE ACETONIDE 40 MG/ML IJ SUSP
40.0000 mg | Freq: Once | INTRAMUSCULAR | Status: AC
  Administered 2017-05-15: 40 mg
  Filled 2017-05-15: qty 1

## 2017-05-15 MED ORDER — IOPAMIDOL (ISOVUE-M 200) INJECTION 41%: 10.0000 mL | Freq: Once | INTRAMUSCULAR | Status: DC

## 2017-05-15 MED ORDER — LACTATED RINGERS IV SOLN
1000.0000 mL | Freq: Once | INTRAVENOUS | Status: AC
  Administered 2017-05-15: 1000 mL via INTRAVENOUS

## 2017-05-15 NOTE — Progress Notes (Signed)
Patient's Name: [name]  MRN: [id number]  Referring Provider: Randel Books, FNP  DOB: [birth year]  PCP: Randel Books, FNP  DOS: 05/15/2017  Note by: Gaspar Cola, MD  Service setting: Ambulatory outpatient  Specialty: Interventional Pain Management  Patient type: Established  Location: [facility] (AMB) Pain Management Facility  Visit type: Interventional Procedure   Primary Reason for Visit: Interventional Pain Management Treatment. CC: Back Pain (middle, lower)  Procedure:  Anesthesia, Analgesia, Anxiolysis:  Procedure #1: Type: Diagnostic Inter-Laminar Epidural Steroid Injection  #1  Region: Lumbar Level: L1-2 Level. Laterality: Right-Sided Paramedial  Procedure #2: Type: Diagnostic Trans-Foraminal Epidural Steroid Injection  #1  Region: Lumbar Level: L4 Paravertebral Laterality: Left-Sided Paravertebral Position: Prone  Type: Moderate (Conscious) Sedation combined with Local Anesthesia Indication(s): Analgesia and Anxiety Route: Intravenous (IV) IV Access: Secured Sedation: Meaningful verbal contact was maintained at all times during the procedure  Local Anesthetic: Lidocaine 1-2%   Indications: 1. DDD (degenerative disc disease), lumbar   2. Chronic radicular lumbar pain (Left)   3. Chronic lower extremity pain (Location of Secondary source of pain) (Left)   4. Lumbar foraminal stenosis (bilateral L4-5)   5. Spinal stenosis of lumbar region with neurogenic claudication   6. Postlaminectomy syndrome, lumbar region    Pain Score: Pre-procedure: 8 /10 Post-procedure: 0-No pain/10  Pre-op Assessment:  [name] is a 72 y.o. (year old), female patient, seen today for interventional treatment. She  has a past surgical history that includes Vaginal hysterectomy (1975); Incontinence surgery (2001); Oophorectomy (1978); Elbow Arthroplasty (1990); Cholecystectomy (1989); Rectocele repair (2009); back implant (2015); Mastectomy partial / lumpectomy  (Bilateral, 1978 ); Augmentation mammaplasty (Bilateral, 1978); and Colonoscopy with propofol (N/A, 01/29/2017). [name] has a current medication list which includes the following prescription(s): albuterol, alendronate, alprazolam, aspirin ec, atenolol, calcium, cholecalciferol, cinnamon, citalopram, colchicine, conjugated estrogens, fenofibrate, fluticasone-salmeterol, furosemide, gabapentin, meclizine, melatonin, vitamin k2, montelukast, omega 3-6-9 complex, oxycodone, oxycodone, oxycodone, ropinirole, tizanidine, and zolpidem, and the following Facility-Administered Medications: fentanyl, iopamidol, and lactated ringers. Her primarily concern today is the Back Pain (middle, lower)  Initial Vital Signs:  Pulse/HCG Rate: 64ECG Heart Rate: 62 Temp: 97.7 F (36.5 C) Resp: 16 BP: (!) 112/57 SpO2: 95 %  BMI: Estimated body mass index is 29.48 kg/m as calculated from the following:   Height as of this encounter: 5\' 1"  (1.549 m).   Weight as of this encounter: 156 lb (70.8 kg).  Risk Assessment: Allergies: Reviewed. She is allergic to versed [midazolam]; morphine; vicodin [hydrocodone-acetaminophen]; niacin and related; and penicillins.  Allergy Precautions: None required Coagulopathies: Reviewed. None identified.  Blood-thinner therapy: None at this time Active Infection(s): Reviewed. None identified. [name] is afebrile  Site Confirmation: [name] was asked to confirm the procedure and laterality before marking the site Procedure checklist: Completed Consent: Before the procedure and under the influence of no sedative(s), amnesic(s), or anxiolytics, the patient was informed of the treatment options, risks and possible complications. To fulfill our ethical and legal obligations, as recommended by the American Medical Association's Code of Ethics, I have informed the patient of my clinical impression; the nature and purpose of the treatment or procedure; the risks, benefits, and  possible complications of the intervention; the alternatives, including doing nothing; the risk(s) and benefit(s) of the alternative treatment(s) or procedure(s); and the risk(s) and benefit(s) of doing nothing. The patient was provided information about the general risks and possible complications associated with the procedure. These may include, but are not limited to: failure to  achieve desired goals, infection, bleeding, organ or nerve damage, allergic reactions, paralysis, and death. In addition, the patient was informed of those risks and complications associated to Spine-related procedures, such as failure to decrease pain; infection (i.e.: Meningitis, epidural or intraspinal abscess); bleeding (i.e.: epidural hematoma, subarachnoid hemorrhage, or any other type of intraspinal or peri-dural bleeding); organ or nerve damage (i.e.: Any type of peripheral nerve, nerve root, or spinal cord injury) with subsequent damage to sensory, motor, and/or autonomic systems, resulting in permanent pain, numbness, and/or weakness of one or several areas of the body; allergic reactions; (i.e.: anaphylactic reaction); and/or death. Furthermore, the patient was informed of those risks and complications associated with the medications. These include, but are not limited to: allergic reactions (i.e.: anaphylactic or anaphylactoid reaction(s)); adrenal axis suppression; blood sugar elevation that in diabetics may result in ketoacidosis or comma; water retention that in patients with history of congestive heart failure may result in shortness of breath, pulmonary edema, and decompensation with resultant heart failure; weight gain; swelling or edema; medication-induced neural toxicity; particulate matter embolism and blood vessel occlusion with resultant organ, and/or nervous system infarction; and/or aseptic necrosis of one or more joints. Finally, the patient was informed that Medicine is not an exact science; therefore, there  is also the possibility of unforeseen or unpredictable risks and/or possible complications that may result in a catastrophic outcome. The patient indicated having understood very clearly. We have given the patient no guarantees and we have made no promises. Enough time was given to the patient to ask questions, all of which were answered to the patient's satisfaction. Ms. Pedigo has indicated that she wanted to continue with the procedure. Attestation: I, the ordering provider, attest that I have discussed with the patient the benefits, risks, side-effects, alternatives, likelihood of achieving goals, and potential problems during recovery for the procedure that I have provided informed consent. Date  Time: 05/15/2017 12:56 PM  Pre-Procedure Preparation:  Monitoring: As per clinic protocol. Respiration, ETCO2, SpO2, BP, heart rate and rhythm monitor placed and checked for adequate function Safety Precautions: Patient was assessed for positional comfort and pressure points before starting the procedure. Time-out: I initiated and conducted the "Time-out" before starting the procedure, as per protocol. The patient was asked to participate by confirming the accuracy of the "Time Out" information. Verification of the correct person, site, and procedure were performed and confirmed by me, the nursing staff, and the patient. "Time-out" conducted as per Joint Commission's Universal Protocol (UP.01.01.01). Time: 1346  Description of Procedure #1 Process:   Target Area: The  interlaminar space, initially targeting the lower border of the superior vertebral body lamina. Approach: Posterior paramedial approach. Area Prepped: Entire Posterior Lumbosacral Region Prepping solution: ChloraPrep (2% chlorhexidine gluconate and 70% isopropyl alcohol) Safety Precautions: Aspiration looking for blood return was conducted prior to all injections. At no point did we inject any substances, as a needle was being advanced.  No attempts were made at seeking any paresthesias. Safe injection practices and needle disposal techniques used. Medications properly checked for expiration dates. SDV (single dose vial) medications used. Description of the Procedure: Protocol guidelines were followed. The patient was placed in position over the fluoroscopy table. The target area was identified and the area prepped in the usual manner. Skin desensitized using vapocoolant spray. Skin & deeper tissues infiltrated with local anesthetic. Appropriate amount of time allowed to pass for local anesthetics to take effect. The procedure needle was introduced through the skin, ipsilateral to the reported pain, and  advanced to the target area. Bone was contacted and the needle walked caudad, until the lamina was cleared. The ligamentum flavum was engaged and loss-of-resistance technique used as the epidural needle was advanced. The epidural space was identified using "loss-of-resistance technique" with 2-3 ml of PF-NaCl (0.9% NSS), in a 5cc LOR glass syringe. Proper needle placement secured. Negative aspiration confirmed. Solution injected in intermittent fashion, asking for systemic symptoms every 0.5cc of injectate. The needles were then removed and the area cleansed, making sure to leave some of the prepping solution back to take advantage of its long term bactericidal properties. Start Time: 1346 hrs. Materials:  Needle(s) Type: Epidural needle Gauge: 17G Length: 3.5-in Medication(s): Please see orders for medications and dosing details.  Description of Procedure #2 Process:   Target Area: The inferior and lateral portion of the pedicle, just lateral to a line created by the 6:00 position of the pedicle and the superior articular process of the vertebral body below. On the lateral view, this target lies just posterior to the anterior aspect of the lamina and posterior to the midpoint created between the anterior and the posterior aspect of the  neural foramina. Approach: Posterior paravertebral approach. Area Prepped: Same as above Prepping solution: Same as above Safety Precautions: Same as above Description of the Procedure: Protocol guidelines were followed. The patient was placed in position over the fluoroscopy table. The target area was identified and the area prepped in the usual manner. Skin desensitized using vapocoolant spray. Skin & deeper tissues infiltrated with local anesthetic. Appropriate amount of time allowed to pass for local anesthetics to take effect. The procedure needles were then advanced to the target area. Proper needle placement secured. Negative aspiration confirmed. Solution injected in intermittent fashion, asking for systemic symptoms every 0.2cc of injectate. The needles were then removed and the area cleansed, making sure to leave some of the prepping solution back to take advantage of its long term bactericidal properties. Vitals:   05/15/17 1400 05/15/17 1408 05/15/17 1418 05/15/17 1428  BP: (!) 107/49 (!) 120/58 (!) 124/56 125/60  Pulse:      Resp: 14 13 15 18   Temp:      TempSrc:      SpO2: 98% 96% 96% 100%  Weight:      Height:        End Time: 1359 hrs. Materials:  Needle(s) Type: Regular needle Gauge: 22G Length: 3.5-in Medication(s): Please see orders for medications and dosing details.  Imaging Guidance (Spinal):  Type of Imaging Technique: Fluoroscopy Guidance (Spinal) Indication(s): Assistance in needle guidance and placement for procedures requiring needle placement in or near specific anatomical locations not easily accessible without such assistance. Exposure Time: Please see nurses notes. Contrast: Before injecting any contrast, we confirmed that the patient did not have an allergy to iodine, shellfish, or radiological contrast. Once satisfactory needle placement was completed at the desired level, radiological contrast was injected. Contrast injected under live fluoroscopy. No  contrast complications. See chart for type and volume of contrast used. Fluoroscopic Guidance: I was personally present during the use of fluoroscopy. "Tunnel Vision Technique" used to obtain the best possible view of the target area. Parallax error corrected before commencing the procedure. "Direction-depth-direction" technique used to introduce the needle under continuous pulsed fluoroscopy. Once target was reached, antero-posterior, oblique, and lateral fluoroscopic projection used confirm needle placement in all planes. Images permanently stored in EMR. Interpretation: I personally interpreted the imaging intraoperatively. Adequate needle placement confirmed in multiple planes. Appropriate spread of contrast  into desired area was observed. No evidence of afferent or efferent intravascular uptake. No intrathecal or subarachnoid spread observed. Permanent images saved into the patient's record.  Antibiotic Prophylaxis:   Anti-infectives (From admission, onward)   None     Indication(s): None identified  Post-operative Assessment:  Post-procedure Vital Signs:  Pulse/HCG Rate: 6462 Temp: 97.7 F (36.5 C) Resp: 18 BP: 125/60 SpO2: 100 %  EBL: None  Complications: No immediate post-treatment complications observed by team, or reported by patient.  Note: The patient tolerated the entire procedure well. A repeat set of vitals were taken after the procedure and the patient was kept under observation following institutional policy, for this type of procedure. Post-procedural neurological assessment was performed, showing return to baseline, prior to discharge. The patient was provided with post-procedure discharge instructions, including a section on how to identify potential problems. Should any problems arise concerning this procedure, the patient was given instructions to immediately contact us, at any time, without hesitation. In any case, we plan to contact the patient by telephone for a  follow-up status report regarding this interventional procedure.  Comments:  No additional relevant information.  Plan of Care   Imaging Orders     DG C-Arm 1-60 Min-No Report  Procedure Orders     Lumbar Transforaminal Epidural     Lumbar Epidural Injection  Medications ordered for procedure: Meds ordered this encounter  Medications  . lidocaine (XYLOCAINE) 2 % (with pres) injection 400 mg  . fentaNYL (SUBLIMAZE) injection 25-50 mcg    Make sure Narcan is available in the pyxis when using this medication. In the event of respiratory depression (RR< 8/min): Titrate NARCAN (naloxone) in increments of 0.1 to 0.2 mg IV at 2-3 minute intervals, until desired degree of reversal.  . lactated ringers infusion 1,000 mL  . sodium chloride flush (NS) 0.9 % injection 1 mL  . ropivacaine (PF) 2 mg/mL (0.2%) (NAROPIN) injection 1 mL  . dexamethasone (DECADRON) injection 10 mg  . sodium chloride flush (NS) 0.9 % injection 2 mL  . ropivacaine (PF) 2 mg/mL (0.2%) (NAROPIN) injection 2 mL  . triamcinolone acetonide (KENALOG-40) injection 40 mg  . iopamidol (ISOVUE-M) 41 % intrathecal injection 10 mL    Must be Myelogram-compatible. If not available, you may substitute with a water-soluble, non-ionic, hypoallergenic, myelogram-compatible radiological contrast medium.   Medications administered: We administered lidocaine, fentaNYL, lactated ringers, sodium chloride flush, ropivacaine (PF) 2 mg/mL (0.2%), dexamethasone, sodium chloride flush, ropivacaine (PF) 2 mg/mL (0.2%), and triamcinolone acetonide.  See the medical record for exact dosing, route, and time of administration.  New Prescriptions   No medications on file   Disposition: Discharge home  Discharge Date & Time: 05/15/2017; 1435 hrs.   Physician-requested Follow-up: Return for post-procedure eval (2 wks), w/ Dr. Dossie Arbour.  Future Appointments  Date Time Provider [place]  05/30/2017  8:20 AM [facility]   06/11/2017 10:15 AM Milinda Pointer, MD [facility] None  08/20/2017  8:45 AM Vevelyn Francois, NP [facility] None   Primary Care Physician: Randel Books, FNP Location: [facility] Note by: Gaspar Cola, MD Date: 05/15/2017; Time: 2:48 PM  Disclaimer:  Medicine is not an Chief Strategy Officer. The only guarantee in medicine is that nothing is guaranteed. It is important to note that the decision to proceed with this intervention was based on the information collected from the patient. The Data and conclusions were drawn from the patient's questionnaire, the interview, and the physical examination. Because  the information was provided in large part by the patient, it cannot be guaranteed that it has not been purposely or unconsciously manipulated. Every effort has been made to obtain as much relevant data as possible for this evaluation. It is important to note that the conclusions that lead to this procedure are derived in large part from the available data. Always take into account that the treatment will also be dependent on availability of resources and existing treatment guidelines, considered by other Pain Management Practitioners as being common knowledge and practice, at the time of the intervention. For Medico-Legal purposes, it is also important to point out that variation in procedural techniques and pharmacological choices are the acceptable norm. The indications, contraindications, technique, and results of the above procedure should only be interpreted and judged by a Board-Certified Interventional Pain Specialist with extensive familiarity and expertise in the same exact procedure and technique.

## 2017-05-15 NOTE — Patient Instructions (Signed)

## 2017-05-15 NOTE — Progress Notes (Signed)
Safety precautions to be maintained throughout the outpatient stay will include: orient to surroundings, keep bed in low position, maintain call bell within reach at all times, provide assistance with transfer out of bed and ambulation.  

## 2017-05-16 ENCOUNTER — Telehealth: Payer: Self-pay

## 2017-05-16 NOTE — Telephone Encounter (Signed)
post procedure phone call.  Patient states she is doing ok.

## 2017-05-20 ENCOUNTER — Ambulatory Visit: Payer: Medicare Other | Admitting: Pain Medicine

## 2017-05-30 ENCOUNTER — Ambulatory Visit
Admission: RE | Admit: 2017-05-30 | Discharge: 2017-05-30 | Disposition: A | Discharge status: Home / Self Care | Payer: Medicare Other | Source: Ambulatory Visit | Attending: Family Medicine | Admitting: Family Medicine

## 2017-05-30 DIAGNOSIS — Z1231 Encounter for screening mammogram for malignant neoplasm of breast: Secondary | ICD-10-CM

## 2017-06-10 NOTE — Progress Notes (Signed)
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 sided L4 transforaminal ESI #2    Palliative PRN treatment(s):   Diagnostic bilateral intra-articular hip injection. Palliative bilateral lumbar facet RFA.  Palliative bilateral lumbar facet block. Diagnostic  left sacroiliac joint block. Palliative L1-2 vs L4-4 LESI. Palliative bilateral L4-5 TFESI.   Provider-requested follow-up: Return for RFA (fluoro + sedation): (L) L-FCT + SI RFA.  Future Appointments  Date Time Provider [place]  07/10/2017  2:15 PM Milinda Pointer, MD [facility] None  08/20/2017  8:45 AM Vevelyn Francois, NP [facility] None   Primary Care Physician: Randel Books, FNP Location: [facility] Note by: Gaspar Cola, MD Date: 06/11/2017; Time: 10:38 AM

## 2017-06-11 ENCOUNTER — Other Ambulatory Visit: Payer: Self-pay

## 2017-06-11 ENCOUNTER — Encounter: Payer: Self-pay | Admitting: Pain Medicine

## 2017-06-11 ENCOUNTER — Ambulatory Visit: Payer: Medicare Other | Attending: Pain Medicine | Admitting: Pain Medicine

## 2017-06-11 VITALS — BP 135/71 | HR 76 | Temp 97.6°F | Resp 16 | Ht 62.0 in | Wt 156.0 lb

## 2017-06-11 DIAGNOSIS — M961 Postlaminectomy syndrome, not elsewhere classified: Secondary | ICD-10-CM | POA: Insufficient documentation

## 2017-06-11 DIAGNOSIS — Z9889 Other specified postprocedural states: Secondary | ICD-10-CM | POA: Insufficient documentation

## 2017-06-11 DIAGNOSIS — F411 Generalized anxiety disorder: Secondary | ICD-10-CM | POA: Insufficient documentation

## 2017-06-11 DIAGNOSIS — Z87891 Personal history of nicotine dependence: Secondary | ICD-10-CM | POA: Insufficient documentation

## 2017-06-11 DIAGNOSIS — Z969 Presence of functional implant, unspecified: Secondary | ICD-10-CM

## 2017-06-11 DIAGNOSIS — Z9049 Acquired absence of other specified parts of digestive tract: Secondary | ICD-10-CM | POA: Insufficient documentation

## 2017-06-11 DIAGNOSIS — M5388 Other specified dorsopathies, sacral and sacrococcygeal region: Secondary | ICD-10-CM

## 2017-06-11 DIAGNOSIS — Z7982 Long term (current) use of aspirin: Secondary | ICD-10-CM | POA: Diagnosis not present

## 2017-06-11 DIAGNOSIS — M797 Fibromyalgia: Secondary | ICD-10-CM | POA: Insufficient documentation

## 2017-06-11 DIAGNOSIS — K449 Diaphragmatic hernia without obstruction or gangrene: Secondary | ICD-10-CM | POA: Insufficient documentation

## 2017-06-11 DIAGNOSIS — R5382 Chronic fatigue, unspecified: Secondary | ICD-10-CM | POA: Insufficient documentation

## 2017-06-11 DIAGNOSIS — Z791 Long term (current) use of non-steroidal anti-inflammatories (NSAID): Secondary | ICD-10-CM | POA: Diagnosis not present

## 2017-06-11 DIAGNOSIS — G8929 Other chronic pain: Secondary | ICD-10-CM

## 2017-06-11 DIAGNOSIS — M47816 Spondylosis without myelopathy or radiculopathy, lumbar region: Secondary | ICD-10-CM | POA: Diagnosis not present

## 2017-06-11 DIAGNOSIS — G2581 Restless legs syndrome: Secondary | ICD-10-CM | POA: Diagnosis not present

## 2017-06-11 DIAGNOSIS — K589 Irritable bowel syndrome without diarrhea: Secondary | ICD-10-CM | POA: Insufficient documentation

## 2017-06-11 DIAGNOSIS — G4700 Insomnia, unspecified: Secondary | ICD-10-CM | POA: Insufficient documentation

## 2017-06-11 DIAGNOSIS — Z79899 Other long term (current) drug therapy: Secondary | ICD-10-CM | POA: Diagnosis not present

## 2017-06-11 DIAGNOSIS — Z803 Family history of malignant neoplasm of breast: Secondary | ICD-10-CM | POA: Insufficient documentation

## 2017-06-11 DIAGNOSIS — J449 Chronic obstructive pulmonary disease, unspecified: Secondary | ICD-10-CM | POA: Diagnosis not present

## 2017-06-11 DIAGNOSIS — M109 Gout, unspecified: Secondary | ICD-10-CM | POA: Insufficient documentation

## 2017-06-11 DIAGNOSIS — M533 Sacrococcygeal disorders, not elsewhere classified: Secondary | ICD-10-CM | POA: Insufficient documentation

## 2017-06-11 DIAGNOSIS — M25552 Pain in left hip: Secondary | ICD-10-CM | POA: Diagnosis not present

## 2017-06-11 DIAGNOSIS — K219 Gastro-esophageal reflux disease without esophagitis: Secondary | ICD-10-CM | POA: Diagnosis not present

## 2017-06-11 DIAGNOSIS — Z880 Allergy status to penicillin: Secondary | ICD-10-CM | POA: Diagnosis not present

## 2017-06-11 DIAGNOSIS — G894 Chronic pain syndrome: Secondary | ICD-10-CM | POA: Insufficient documentation

## 2017-06-11 DIAGNOSIS — Z9013 Acquired absence of bilateral breasts and nipples: Secondary | ICD-10-CM | POA: Insufficient documentation

## 2017-06-11 DIAGNOSIS — Z79891 Long term (current) use of opiate analgesic: Secondary | ICD-10-CM | POA: Diagnosis not present

## 2017-06-11 DIAGNOSIS — Z885 Allergy status to narcotic agent status: Secondary | ICD-10-CM | POA: Diagnosis not present

## 2017-06-11 DIAGNOSIS — E669 Obesity, unspecified: Secondary | ICD-10-CM | POA: Diagnosis not present

## 2017-06-11 DIAGNOSIS — M25551 Pain in right hip: Secondary | ICD-10-CM | POA: Diagnosis not present

## 2017-06-11 DIAGNOSIS — M47817 Spondylosis without myelopathy or radiculopathy, lumbosacral region: Secondary | ICD-10-CM | POA: Diagnosis not present

## 2017-06-11 DIAGNOSIS — F329 Major depressive disorder, single episode, unspecified: Secondary | ICD-10-CM | POA: Insufficient documentation

## 2017-06-11 DIAGNOSIS — M48061 Spinal stenosis, lumbar region without neurogenic claudication: Secondary | ICD-10-CM | POA: Insufficient documentation

## 2017-06-11 DIAGNOSIS — M792 Neuralgia and neuritis, unspecified: Secondary | ICD-10-CM

## 2017-06-11 DIAGNOSIS — Z950 Presence of cardiac pacemaker: Secondary | ICD-10-CM | POA: Insufficient documentation

## 2017-06-11 DIAGNOSIS — I10 Essential (primary) hypertension: Secondary | ICD-10-CM | POA: Diagnosis not present

## 2017-06-11 DIAGNOSIS — Z888 Allergy status to other drugs, medicaments and biological substances status: Secondary | ICD-10-CM | POA: Insufficient documentation

## 2017-06-11 DIAGNOSIS — G8918 Other acute postprocedural pain: Secondary | ICD-10-CM | POA: Insufficient documentation

## 2017-06-11 DIAGNOSIS — Z8249 Family history of ischemic heart disease and other diseases of the circulatory system: Secondary | ICD-10-CM | POA: Insufficient documentation

## 2017-06-11 DIAGNOSIS — E785 Hyperlipidemia, unspecified: Secondary | ICD-10-CM | POA: Insufficient documentation

## 2017-06-11 DIAGNOSIS — M545 Low back pain: Secondary | ICD-10-CM

## 2017-06-11 DIAGNOSIS — Z90710 Acquired absence of both cervix and uterus: Secondary | ICD-10-CM | POA: Insufficient documentation

## 2017-06-11 MED ORDER — GABAPENTIN 300 MG PO CAPS: 300.0000 mg | ORAL_CAPSULE | Freq: Four times a day (QID) | ORAL | 0 refills | Status: DC

## 2017-06-11 NOTE — Progress Notes (Signed)
Safety precautions to be maintained throughout the outpatient stay will include: orient to surroundings, keep bed in low position, maintain call bell within reach at all times, provide assistance with transfer out of bed and ambulation.  

## 2017-06-11 NOTE — Patient Instructions (Signed)
____________________________________________________________________________________________  Gabapentin Titration  Medication used: Gabapentin (Generic Name) or Neurontin (Brand Name) 300 mg tablets/capsules  Reasons to stop increasing the dose:  Reason 1: You get good relief of symptoms, in which case there is no need to increase the daily dose any further.    Reason 2: You develop some side effects, such as sleeping all of the time, difficulty concentrating, or becoming disoriented, in which case you need to go down on the dose, to the prior level, where you were not experiencing any side effects. Stay on that dose longer, to allow more time for your body to get use it, before attempting to increase it again.   Reasons to stop increasing the dose: Reason 1: You get good relief of symptoms, in which case there is no need to increase the daily dose any further.  Reason 2: You develop some side effects, such as sleeping all of the time, difficulty concentrating, or becoming disoriented, in which case you need to go down on the dose, to the prior level, where you were not experiencing any side effects. Stay on that dose longer, to allow more time for your body to get use it, before attempting to increase it again.  Steps to increase medication: Step 1: Start by taking 1 (one) tablet at bedtime x 7 (seven) days.  Step 2: After 7 (seven) days of taking 1 (one) tablet at bedtime, increase it to 2 (two) tablets at bedtime. Stay on this dose x 7 (seven) days.  Step 3: After 7 (seven) days of taking 2 (two) tablets at bedtime, increase it to 3 (three) tablets at bedtime. Stay on this dose x another 7 (seven) days.  Step 4: After 7 (seven) days of taking 3 (three) tablet at bedtime, begin taking 1 (one) tablet at noon with lunch. Stay on this dose x another 7 (seven) days.  Step 5: After 7 (seven) days of taking 3 (three) tablet at bedtime, and 1 (one) tablet at noon, then begin taking 1 (one)  tablet in the afternoon with dinner. Stay on this dose x another 7 (seven) days.  Step 6: After 7 (seven) days of taking 3 (three) tablet at bedtime, 1 (one) tablet at noon, and 1 (one) tablet in the afternoon, then begin taking 1 (one) tablet in the morning with breakfast. Stay on this dose x another 7 (seven) days. At this point you should be taking the medicine 4 (four) times a day, or about every 6 (six) hours. This daily regimen of taking the medicine 4 (four) times a day, will be maintained from now on. You should not take any doses any sooner than every 6 (six) hours.  Step 7: After 7 (seven) days of taking 3 (three) tablet at bedtime, 1 (one) tablet at noon, 1 (one) tablet in the afternoon, and 1 (one) tablet in the morning, begin taking 2 (two) tablets at noon with lunch. Stay on this dose x another 7 (seven) days.   Step 8: After 7 (seven) days of taking 3 (three) tablet at bedtime, 2 (two) tablets at noon, 1 (one) tablet in the afternoon, and 1 (one) tablet in the morning, begin taking 2 (two) tablets in the afternoon with dinner. Stay on this dose x another 7 (seven) days.   Step 9: After 7 (seven) days of taking 3 (three) tablet at bedtime, 2 (two) tablets at noon, 2 (two) tablets in the afternoon, and 1 (one) tablet in the morning, begin taking 2 (two) tablets in   the morning with breakfast. Stay on this dose x another 7 (seven) days. At this point you should be taking the medicine 4 (four) times a day, or about every 6 (six) hours. This daily regimen of taking the medicine 4 (four) times a day, will be maintained from now on. You should not take any doses any sooner than every 6 (six) hours.  Step 10: After 7 (seven) days of taking 3 (three) tablet at bedtime, 2 (two) tablets at noon, 2 (two) tablets in the afternoon, and 2 (two) tablets in the morning, begin taking 3 (three) tablets at noon with lunch. Stay on this dose x another 7 (seven) days.   Step 11: After 7 (seven) days of taking 3  (three) tablet at bedtime, 3 (three) tablets at noon, 2 (two) tablets in the afternoon, and 2 (two) tablets in the morning, begin taking 3 (three) tablets in the afternoon with dinner. Stay on this dose x another 7 (seven) days.   Step 12: After 7 (seven) days of taking 3 (three) tablet at bedtime, 3 (three) tablets at noon, 3 (three) tablets in the afternoon, and 2 (two) tablet in the morning, begin taking 3 (three) tablets in the morning with breakfast. Stay on this dose x another 7 (seven) days. At this point you should be taking the medicine 4 (four) times a day, or about every 6 (six) hours. This daily regimen of taking the medicine 4 (four) times a day, will be maintained from now on.   Endpoint: Once you have reached the maximum dose you can tolerate without side-effects, contact your physician so as to evaluate the results of the regimen.   Questions: Feel free to contact us for any questions or problems at [phone] ____________________________________________________________________________________________   

## 2017-06-17 DIAGNOSIS — S81012A Laceration without foreign body, left knee, initial encounter: Secondary | ICD-10-CM | POA: Diagnosis not present

## 2017-06-17 DIAGNOSIS — Z87891 Personal history of nicotine dependence: Secondary | ICD-10-CM | POA: Diagnosis not present

## 2017-06-17 DIAGNOSIS — S8002XA Contusion of left knee, initial encounter: Secondary | ICD-10-CM | POA: Diagnosis not present

## 2017-06-17 DIAGNOSIS — M1712 Unilateral primary osteoarthritis, left knee: Secondary | ICD-10-CM | POA: Diagnosis not present

## 2017-06-17 DIAGNOSIS — M549 Dorsalgia, unspecified: Secondary | ICD-10-CM | POA: Diagnosis not present

## 2017-06-17 DIAGNOSIS — I10 Essential (primary) hypertension: Secondary | ICD-10-CM | POA: Diagnosis not present

## 2017-06-17 DIAGNOSIS — G8929 Other chronic pain: Secondary | ICD-10-CM | POA: Diagnosis not present

## 2017-07-01 ENCOUNTER — Telehealth: Payer: Self-pay

## 2017-07-01 NOTE — Telephone Encounter (Signed)
Patient had question concerning procedure. States she is allergic to Versed and the Fentanyl does not work.  Questioned whether she could take a pain pill and xanax prior to procedure.  Instructed patient to bring medications to appointment and he could instruct her whether or not to take . Patient states understanding.

## 2017-07-10 ENCOUNTER — Encounter: Payer: Self-pay | Admitting: Pain Medicine

## 2017-07-10 ENCOUNTER — Ambulatory Visit
Admission: RE | Admit: 2017-07-10 | Discharge: 2017-07-10 | Disposition: A | Discharge status: Home / Self Care | Payer: Medicare Other | Source: Ambulatory Visit | Attending: Pain Medicine | Admitting: Pain Medicine

## 2017-07-10 ENCOUNTER — Ambulatory Visit (HOSPITAL_BASED_OUTPATIENT_CLINIC_OR_DEPARTMENT_OTHER): Payer: Medicare Other | Admitting: Pain Medicine

## 2017-07-10 ENCOUNTER — Other Ambulatory Visit: Payer: Self-pay

## 2017-07-10 VITALS — BP 152/70 | HR 62 | Temp 96.2°F | Resp 16 | Ht 61.0 in | Wt 156.0 lb

## 2017-07-10 DIAGNOSIS — Z888 Allergy status to other drugs, medicaments and biological substances status: Secondary | ICD-10-CM | POA: Diagnosis not present

## 2017-07-10 DIAGNOSIS — M545 Low back pain: Secondary | ICD-10-CM | POA: Diagnosis not present

## 2017-07-10 DIAGNOSIS — Z79891 Long term (current) use of opiate analgesic: Secondary | ICD-10-CM | POA: Diagnosis not present

## 2017-07-10 DIAGNOSIS — Z7983 Long term (current) use of bisphosphonates: Secondary | ICD-10-CM | POA: Insufficient documentation

## 2017-07-10 DIAGNOSIS — Z9049 Acquired absence of other specified parts of digestive tract: Secondary | ICD-10-CM | POA: Insufficient documentation

## 2017-07-10 DIAGNOSIS — M47816 Spondylosis without myelopathy or radiculopathy, lumbar region: Secondary | ICD-10-CM

## 2017-07-10 DIAGNOSIS — F419 Anxiety disorder, unspecified: Secondary | ICD-10-CM | POA: Insufficient documentation

## 2017-07-10 DIAGNOSIS — M47817 Spondylosis without myelopathy or radiculopathy, lumbosacral region: Secondary | ICD-10-CM | POA: Diagnosis not present

## 2017-07-10 DIAGNOSIS — M533 Sacrococcygeal disorders, not elsewhere classified: Secondary | ICD-10-CM

## 2017-07-10 DIAGNOSIS — G8918 Other acute postprocedural pain: Secondary | ICD-10-CM

## 2017-07-10 DIAGNOSIS — G8929 Other chronic pain: Secondary | ICD-10-CM | POA: Diagnosis not present

## 2017-07-10 DIAGNOSIS — Z880 Allergy status to penicillin: Secondary | ICD-10-CM | POA: Insufficient documentation

## 2017-07-10 DIAGNOSIS — Z96629 Presence of unspecified artificial elbow joint: Secondary | ICD-10-CM | POA: Insufficient documentation

## 2017-07-10 DIAGNOSIS — M5388 Other specified dorsopathies, sacral and sacrococcygeal region: Secondary | ICD-10-CM

## 2017-07-10 DIAGNOSIS — Z90710 Acquired absence of both cervix and uterus: Secondary | ICD-10-CM | POA: Diagnosis not present

## 2017-07-10 DIAGNOSIS — F411 Generalized anxiety disorder: Secondary | ICD-10-CM

## 2017-07-10 DIAGNOSIS — Z79899 Other long term (current) drug therapy: Secondary | ICD-10-CM | POA: Diagnosis not present

## 2017-07-10 DIAGNOSIS — Z7982 Long term (current) use of aspirin: Secondary | ICD-10-CM | POA: Insufficient documentation

## 2017-07-10 DIAGNOSIS — Z7951 Long term (current) use of inhaled steroids: Secondary | ICD-10-CM | POA: Insufficient documentation

## 2017-07-10 DIAGNOSIS — Z885 Allergy status to narcotic agent status: Secondary | ICD-10-CM | POA: Diagnosis not present

## 2017-07-10 DIAGNOSIS — M8938 Hypertrophy of bone, other site: Secondary | ICD-10-CM | POA: Insufficient documentation

## 2017-07-10 MED ORDER — FENTANYL CITRATE (PF) 100 MCG/2ML IJ SOLN
INTRAMUSCULAR | Status: AC
  Filled 2017-07-10: qty 2

## 2017-07-10 MED ORDER — DIPHENHYDRAMINE HCL 50 MG/ML IJ SOLN
INTRAMUSCULAR | Status: AC
  Filled 2017-07-10: qty 1

## 2017-07-10 MED ORDER — OXYCODONE-ACETAMINOPHEN 5-325 MG PO TABS
1.0000 | ORAL_TABLET | Freq: Four times a day (QID) | ORAL | 0 refills | Status: AC | PRN
Start: 2017-07-10 — End: 2017-07-17

## 2017-07-10 MED ORDER — MIDAZOLAM HCL 5 MG/5ML IJ SOLN
1.0000 mg | INTRAMUSCULAR | Status: DC | PRN
  Administered 2017-07-10: 1 mg via INTRAVENOUS
  Filled 2017-07-10: qty 5

## 2017-07-10 MED ORDER — LACTATED RINGERS IV SOLN
1000.0000 mL | Freq: Once | INTRAVENOUS | Status: AC
  Administered 2017-07-10: 1000 mL via INTRAVENOUS

## 2017-07-10 MED ORDER — ROPIVACAINE HCL 2 MG/ML IJ SOLN
9.0000 mL | Freq: Once | INTRAMUSCULAR | Status: AC
  Administered 2017-07-10: 9 mL via PERINEURAL
  Filled 2017-07-10: qty 10

## 2017-07-10 MED ORDER — TRIAMCINOLONE ACETONIDE 40 MG/ML IJ SUSP
40.0000 mg | Freq: Once | INTRAMUSCULAR | Status: AC
  Administered 2017-07-10: 40 mg
  Filled 2017-07-10: qty 1

## 2017-07-10 MED ORDER — LIDOCAINE HCL 2 % IJ SOLN
20.0000 mL | Freq: Once | INTRAMUSCULAR | Status: AC
  Administered 2017-07-10: 400 mg
  Filled 2017-07-10: qty 40

## 2017-07-10 MED ORDER — DIPHENHYDRAMINE HCL 50 MG/ML IJ SOLN
25.0000 mg | Freq: Once | INTRAMUSCULAR | Status: AC
  Administered 2017-07-10: 50 mg via INTRAVENOUS

## 2017-07-10 MED ORDER — FENTANYL CITRATE (PF) 100 MCG/2ML IJ SOLN
25.0000 ug | INTRAMUSCULAR | Status: DC | PRN
  Administered 2017-07-10: 100 ug via INTRAVENOUS
  Filled 2017-07-10: qty 2

## 2017-07-10 NOTE — Progress Notes (Signed)
Patient's Name: [name]  MRN: [id number]  Referring Provider: Randel Books, FNP  DOB: 11/20/1945  PCP: Randel Books, FNP  DOS: 07/10/2017  Note by: Gaspar Cola, MD  Service setting: Ambulatory outpatient  Specialty: Interventional Pain Management  Patient type: Established  Location: [facility] (AMB) Pain Management Facility  Visit type: Interventional Procedure   Primary Reason for Visit: Interventional Pain Management Treatment. CC: Back Pain (lower back)  Procedure:       Anesthesia, Analgesia, Anxiolysis:  Type: Thermal Lumbar Facet, Medial Branch & Sacroiliac joint Radiofrequency Ablation #2 Region: Lumbosacral Level: L2, L3, L4, L5, S1, S2, & S3 Medial Branch Level(s). These levels will denervate the L3-4, L4-5, and the L5-S1 lumbar facet joints, as well as the posterior Sacroiliac Joint innervation. Primary Purpose: Therapeutic Region: Posterolateral Lumbosacral Spine Laterality: Left  Type: Moderate (Conscious) Sedation combined with Local Anesthesia Indication(s): Analgesia and Anxiety Route: Intravenous (IV) IV Access: Secured Sedation: Meaningful verbal contact was maintained at all times during the procedure  Local Anesthetic: Lidocaine 1-2%   Indications: 1. Spondylosis without myelopathy or radiculopathy, lumbosacral region   2. Lumbar facet syndrome (Bilateral) (L>R)   3. Lumbar facet hypertrophy (L4-5)   4. Lumbar facet arthropathy (HCC)   5. Other specified dorsopathies, sacral and sacrococcygeal region   6. Chronic sacroiliac joint pain (Left)   7. Chronic low back pain (Primary Source of Pain) (Bilateral) (L>R)    [name] has been dealing with the above chronic pain for longer than three months and has either failed to respond, was unable to tolerate, or simply did not get enough benefit from other more conservative therapies including, but not limited to: 1. Over-the-counter medications 2. Anti-inflammatory medications 3. Muscle  relaxants 4. Membrane stabilizers 5. Opioids 6. Physical therapy 7. Modalities (Heat, ice, etc.) 8. Invasive techniques such as nerve blocks. [name] has attained more than 50% relief of the pain from a series of diagnostic injections conducted in separate occasions.  Pain Score: Pre-procedure: 3 /10 Post-procedure: 0-No pain/10  Pre-op Assessment:  [name] is a 72 y.o. (year old), female patient, seen today for interventional treatment. She  has a past surgical history that includes Vaginal hysterectomy (1975); Incontinence surgery (2001); Oophorectomy (1978); Elbow Arthroplasty (1990); Cholecystectomy (1989); Rectocele repair (2009); back implant (2015); Mastectomy partial / lumpectomy (Bilateral, 1978 ); Augmentation mammaplasty (Bilateral, 1978); and Colonoscopy with propofol (N/A, 01/29/2017). [name] has a current medication list which includes the following prescription(s): albuterol, alendronate, alprazolam, aspirin ec, atenolol, calcium, cholecalciferol, cinnamon, citalopram, conjugated estrogens, fenofibrate, fluticasone-salmeterol, furosemide, gabapentin, meclizine, melatonin, vitamin k2, montelukast, omega 3-6-9 complex, oxycodone, oxycodone, ropinirole, tizanidine, zolpidem, oxycodone, and oxycodone-acetaminophen, and the following Facility-Administered Medications: fentanyl. Her primarily concern today is the Back Pain (lower back)  Initial Vital Signs:  Pulse/HCG Rate: (!) 101ECG Heart Rate: 61 Temp: 98 F (36.7 C) Resp: 18 BP: (!) 159/67 SpO2: 97 %  BMI: Estimated body mass index is 29.48 kg/m as calculated from the following:   Height as of this encounter: 5\' 1"  (1.549 m).   Weight as of this encounter: 156 lb (70.8 kg).  Risk Assessment: Allergies: Reviewed. She is allergic to versed [midazolam]; morphine; vicodin [hydrocodone-acetaminophen]; niacin and related; and penicillins.  Allergy Precautions: None required Coagulopathies: Reviewed. None  identified.  Blood-thinner therapy: None at this time Active Infection(s): Reviewed. None identified. [name] is afebrile  Site Confirmation: [name] was asked to confirm the procedure and laterality before marking the site Procedure checklist: Completed Consent: Before  the procedure and under the influence of no sedative(s), amnesic(s), or anxiolytics, the patient was informed of the treatment options, risks and possible complications. To fulfill our ethical and legal obligations, as recommended by the American Medical Association's Code of Ethics, I have informed the patient of my clinical impression; the nature and purpose of the treatment or procedure; the risks, benefits, and possible complications of the intervention; the alternatives, including doing nothing; the risk(s) and benefit(s) of the alternative treatment(s) or procedure(s); and the risk(s) and benefit(s) of doing nothing. The patient was provided information about the general risks and possible complications associated with the procedure. These may include, but are not limited to: failure to achieve desired goals, infection, bleeding, organ or nerve damage, allergic reactions, paralysis, and death. In addition, the patient was informed of those risks and complications associated to Spine-related procedures, such as failure to decrease pain; infection (i.e.: Meningitis, epidural or intraspinal abscess); bleeding (i.e.: epidural hematoma, subarachnoid hemorrhage, or any other type of intraspinal or peri-dural bleeding); organ or nerve damage (i.e.: Any type of peripheral nerve, nerve root, or spinal cord injury) with subsequent damage to sensory, motor, and/or autonomic systems, resulting in permanent pain, numbness, and/or weakness of one or several areas of the body; allergic reactions; (i.e.: anaphylactic reaction); and/or death. Furthermore, the patient was informed of those risks and complications associated with the  medications. These include, but are not limited to: allergic reactions (i.e.: anaphylactic or anaphylactoid reaction(s)); adrenal axis suppression; blood sugar elevation that in diabetics may result in ketoacidosis or comma; water retention that in patients with history of congestive heart failure may result in shortness of breath, pulmonary edema, and decompensation with resultant heart failure; weight gain; swelling or edema; medication-induced neural toxicity; particulate matter embolism and blood vessel occlusion with resultant organ, and/or nervous system infarction; and/or aseptic necrosis of one or more joints. Finally, the patient was informed that Medicine is not an exact science; therefore, there is also the possibility of unforeseen or unpredictable risks and/or possible complications that may result in a catastrophic outcome. The patient indicated having understood very clearly. We have given the patient no guarantees and we have made no promises. Enough time was given to the patient to ask questions, all of which were answered to the patient's satisfaction. [name] has indicated that she wanted to continue with the procedure. Attestation: I, the ordering provider, attest that I have discussed with the patient the benefits, risks, side-effects, alternatives, likelihood of achieving goals, and potential problems during recovery for the procedure that I have provided informed consent. Date  Time: 07/10/2017  2:18 PM  Pre-Procedure Preparation:  Monitoring: As per clinic protocol. Respiration, ETCO2, SpO2, BP, heart rate and rhythm monitor placed and checked for adequate function Safety Precautions: Patient was assessed for positional comfort and pressure points before starting the procedure. Time-out: I initiated and conducted the "Time-out" before starting the procedure, as per protocol. The patient was asked to participate by confirming the accuracy of the "Time Out" information.  Verification of the correct person, site, and procedure were performed and confirmed by me, the nursing staff, and the patient. "Time-out" conducted as per Joint Commission's Universal Protocol (UP.01.01.01). Time: 1093  Description of Procedure:       Position: Prone Laterality: Left Level: L2, L3, L4, L5, S1, S2, & S3 Medial Branch Level(s), at the L3-4, L4-5, and the L5-S1 lumbar facet joints, as well as the posterior Sacroiliac Joint. Area Prepped: Lumbosacral Prepping solution: ChloraPrep (2% chlorhexidine gluconate and  70% isopropyl alcohol) Safety Precautions: Aspiration looking for blood return was conducted prior to all injections. At no point did we inject any substances, as a needle was being advanced. Before injecting, the patient was told to immediately notify me if she was experiencing any new onset of "ringing in the ears, or metallic taste in the mouth". No attempts were made at seeking any paresthesias. Safe injection practices and needle disposal techniques used. Medications properly checked for expiration dates. SDV (single dose vial) medications used. After the completion of the procedure, all disposable equipment used was discarded in the proper designated medical waste containers. Local Anesthesia: Protocol guidelines were followed. The patient was positioned over the fluoroscopy table. The area was prepped in the usual manner. The time-out was completed. The target area was identified using fluoroscopy. A 12-in long, straight, sterile hemostat was used with fluoroscopic guidance to locate the targets for each level blocked. Once located, the skin was marked with an approved surgical skin marker. Once all sites were marked, the skin (epidermis, dermis, and hypodermis), as well as deeper tissues (fat, connective tissue and muscle) were infiltrated with a small amount of a short-acting local anesthetic, loaded on a 10cc syringe with a 25G, 1.5-in  Needle. An appropriate amount of time  was allowed for local anesthetics to take effect before proceeding to the next step. Local Anesthetic: Lidocaine 2.0% The unused portion of the local anesthetic was discarded in the proper designated containers. Technical explanation of process:  Radiofrequency Ablation (RFA) L2 Medial Branch Nerve RFA: The target area for the L2 medial branch is at the junction of the postero-lateral aspect of the superior articular process and the superior, posterior, and medial edge of the transverse process of L3. Under fluoroscopic guidance, a Radiofrequency needle was inserted until contact was made with os over the superior postero-lateral aspect of the pedicular shadow (target area). Sensory and motor testing was conducted to properly adjust the position of the needle. Once satisfactory placement of the needle was achieved, the numbing solution was slowly injected after negative aspiration for blood. 2.0 mL of the nerve block solution was injected without difficulty or complication. After waiting for at least 3 minutes, the ablation was performed. Once completed, the needle was removed intact. L3 Medial Branch Nerve RFA: The target area for the L3 medial branch is at the junction of the postero-lateral aspect of the superior articular process and the superior, posterior, and medial edge of the transverse process of L4. Under fluoroscopic guidance, a Radiofrequency needle was inserted until contact was made with os over the superior postero-lateral aspect of the pedicular shadow (target area). Sensory and motor testing was conducted to properly adjust the position of the needle. Once satisfactory placement of the needle was achieved, the numbing solution was slowly injected after negative aspiration for blood. 2.0 mL of the nerve block solution was injected without difficulty or complication. After waiting for at least 3 minutes, the ablation was performed. Once completed, the needle was removed intact. L4 Medial Branch  Nerve RFA: The target area for the L4 medial branch is at the junction of the postero-lateral aspect of the superior articular process and the superior, posterior, and medial edge of the transverse process of L5. Under fluoroscopic guidance, a Radiofrequency needle was inserted until contact was made with os over the superior postero-lateral aspect of the pedicular shadow (target area). Sensory and motor testing was conducted to properly adjust the position of the needle. Once satisfactory placement of the needle was  achieved, the numbing solution was slowly injected after negative aspiration for blood. 2.0 mL of the nerve block solution was injected without difficulty or complication. After waiting for at least 3 minutes, the ablation was performed. Once completed, the needle was removed intact. L5 Medial Branch Nerve RFA: The target area for the L5 medial branch is at the junction of the postero-lateral aspect of the superior articular process of S1 and the superior, posterior, and medial edge of the sacral ala. Under fluoroscopic guidance, a Radiofrequency needle was inserted until contact was made with os over the superior postero-lateral aspect of the pedicular shadow (target area). Sensory and motor testing was conducted to properly adjust the position of the needle. Once satisfactory placement of the needle was achieved, the numbing solution was slowly injected after negative aspiration for blood. 2.0 mL of the nerve block solution was injected without difficulty or complication. After waiting for at least 3 minutes, the ablation was performed. Once completed, the needle was removed intact. S1 Primary Dorsal Rami and Lateral Branch Nerve RFA: The target area for the S1 medial branch is located inferior to the junction of the S1 superior articular process and the L5 inferior articular process, posterior, inferior, and lateral to the 6 o'clock position of the L5-S1 facet joint, just superior to the S1  posterior foramen. Under fluoroscopic guidance, the Radiofrequency needle was advanced until contact was made with os over the Target area. Sensory and motor testing was conducted to properly adjust the position of the needle. Once satisfactory placement of the needle was achieved, the numbing solution was slowly injected after negative aspiration for blood. 2.0 mL of the nerve block solution was injected without difficulty or complication. After waiting for at least 3 minutes, the ablation was performed. Once completed, the needle was removed intact. S2 Primary Dorsal Rami and Lateral Branch Nerve RFA: The target area for the S2 medial branch is at the posterior superior lateral of the S2 posterior neural foramen. Under fluoroscopic guidance, the Radiofrequency needle was advanced until contact was made with os over the Target area. Sensory and motor testing was conducted to properly adjust the position of the needle. Once satisfactory placement of the needle was achieved, the numbing solution was slowly injected after negative aspiration for blood. 2.0 mL of the nerve block solution was injected without difficulty or complication. After waiting for at least 3 minutes, the ablation was performed. Once completed, the needle was removed intact. S3 Primary Dorsal Rami and Lateral Branch Nerve RFA: The target area for the S3 medial branch is at the posterior superior lateral of the S3 posterior neural foramen. Under fluoroscopic guidance, the Radiofrequency needle was advanced until contact was made with os over the Target area. Sensory and motor testing was conducted to properly adjust the position of the needle. Once satisfactory placement of the needle was achieved, the numbing solution was slowly injected after negative aspiration for blood. 2.0 mL of the nerve block solution was injected without difficulty or complication. After waiting for at least 3 minutes, the ablation was performed. Once completed, the  needle was removed intact. Radiofrequency lesioning (ablation):  Radiofrequency Generator: NeuroTherm [id number] Sensory Stimulation Parameters: 50 Hz was used to locate & identify the nerve, making sure that the needle was positioned such that there was no sensory stimulation below 0.3 V or above 0.7 V. Motor Stimulation Parameters: 2 Hz was used to evaluate the motor component. Care was taken not to lesion any nerves that demonstrated motor stimulation of  the lower extremities at an output of less than 2.5 times that of the sensory threshold, or a maximum of 2.0 V. Lesioning Technique Parameters: Standard Radiofrequency settings. (Not bipolar or pulsed.) Temperature Settings: 80 degrees C Lesioning time: 60 seconds Intra-operative Compliance: Compliant Materials & Medications: Needle(s) (Electrode/Cannula) Type: Teflon-coated, curved tip, Radiofrequency needle(s) Gauge: 22G Length: 10cm Numbing solution: 0.2% PF-Ropivacaine + Triamcinolone (40 mg/mL) diluted to a final concentration of 4 mg of Triamcinolone/mL of Ropivacaine The unused portion of the solution was discarded in the proper designated containers.  Once the entire procedure was completed, the treated area was cleaned, making sure to leave some of the prepping solution back to take advantage of its long term bactericidal properties.    Illustration of the posterior view of the lumbar spine and the posterior neural structures. Laminae of L2 through S1 are labeled. DPRL5, dorsal primary ramus of L5; DPRS1, dorsal primary ramus of S1; DPR3, dorsal primary ramus of L3; FJ, facet (zygapophyseal) joint L3-L4; I, inferior articular process of L4; LB1, lateral branch of dorsal primary ramus of L1; IAB, inferior articular branches from L3 medial branch (supplies L4-L5 facet joint); IBP, intermediate branch plexus; MB3, medial branch of dorsal primary ramus of L3; NR3, third lumbar nerve root; S, superior articular process of L5; SAB, superior  articular branches from L4 (supplies L4-5 facet joint also); TP3, transverse process of L3.  Vitals:   07/10/17 1610 07/10/17 1620 07/10/17 1628 07/10/17 1635  BP: (!) 180/77 (!) 180/75 (!) 168/70 (!) 152/70  Pulse: 62     Resp: 16 17 16 16   Temp:  (!) 97.3 F (36.3 C)  (!) 96.2 F (35.7 C)  SpO2: 99% 99% 99% 99%  Weight:      Height:        Start Time: 1514 hrs. End Time: 1610 hrs.  Imaging Guidance (Spinal):  Type of Imaging Technique: Fluoroscopy Guidance (Spinal) Indication(s): Assistance in needle guidance and placement for procedures requiring needle placement in or near specific anatomical locations not easily accessible without such assistance. Exposure Time: Please see nurses notes. Contrast: None used. Fluoroscopic Guidance: I was personally present during the use of fluoroscopy. "Tunnel Vision Technique" used to obtain the best possible view of the target area. Parallax error corrected before commencing the procedure. "Direction-depth-direction" technique used to introduce the needle under continuous pulsed fluoroscopy. Once target was reached, antero-posterior, oblique, and lateral fluoroscopic projection used confirm needle placement in all planes. Images permanently stored in EMR. Interpretation: No contrast injected. I personally interpreted the imaging intraoperatively. Adequate needle placement confirmed in multiple planes. Permanent images saved into the patient's record.  Antibiotic Prophylaxis:   Anti-infectives (From admission, onward)   None     Indication(s): None identified  Post-operative Assessment:  Post-procedure Vital Signs:  Pulse/HCG Rate: 6268 Temp: (!) 96.2 F (35.7 C) Resp: 16 BP: (!) 152/70 SpO2: 99 %  EBL: None  Complications: No immediate post-treatment complications observed by team, or reported by patient.  Note: The patient tolerated the entire procedure well. A repeat set of vitals were taken after the procedure and the patient  was kept under observation following institutional policy, for this type of procedure. Post-procedural neurological assessment was performed, showing return to baseline, prior to discharge. The patient was provided with post-procedure discharge instructions, including a section on how to identify potential problems. Should any problems arise concerning this procedure, the patient was given instructions to immediately contact us, at any time, without hesitation. In any case, we plan to  contact the patient by telephone for a follow-up status report regarding this interventional procedure.  Comments:  No additional relevant information.  Plan of Care    Imaging Orders     DG C-Arm 1-60 Min-No Report  Procedure Orders     Radiofrequency,Lumbar     Radiofrequency Sacroiliac Joint  Medications ordered for procedure: Meds ordered this encounter  Medications  . lidocaine (XYLOCAINE) 2 % (with pres) injection 400 mg  . DISCONTD: midazolam (VERSED) 5 MG/5ML injection 1-2 mg    Make sure Flumazenil is available in the pyxis when using this medication. If oversedation occurs, administer 0.2 mg IV over 15 sec. If after 45 sec no response, administer 0.2 mg again over 1 min; may repeat at 1 min intervals; not to exceed 4 doses (1 mg)  . fentaNYL (SUBLIMAZE) injection 25-50 mcg    Make sure Narcan is available in the pyxis when using this medication. In the event of respiratory depression (RR< 8/min): Titrate NARCAN (naloxone) in increments of 0.1 to 0.2 mg IV at 2-3 minute intervals, until desired degree of reversal.  . lactated ringers infusion 1,000 mL  . ropivacaine (PF) 2 mg/mL (0.2%) (NAROPIN) injection 9 mL  . triamcinolone acetonide (KENALOG-40) injection 40 mg  . ropivacaine (PF) 2 mg/mL (0.2%) (NAROPIN) injection 9 mL  . triamcinolone acetonide (KENALOG-40) injection 40 mg  . oxyCODONE-acetaminophen (PERCOCET) 5-325 MG tablet    Sig: Take 1 tablet by mouth every 6 (six) hours as needed for up  to 7 days for severe pain.    Dispense:  28 tablet    Refill:  0    For acute post-operative pain. Not to be refilled. To last 7 days.  . diphenhydrAMINE (BENADRYL) injection 25 mg   Medications administered: We administered lidocaine, midazolam, fentaNYL, lactated ringers, ropivacaine (PF) 2 mg/mL (0.2%), triamcinolone acetonide, ropivacaine (PF) 2 mg/mL (0.2%), triamcinolone acetonide, and diphenhydrAMINE.  See the medical record for exact dosing, route, and time of administration.  New Prescriptions   OXYCODONE-ACETAMINOPHEN (PERCOCET) 5-325 MG TABLET    Take 1 tablet by mouth every 6 (six) hours as needed for up to 7 days for severe pain.   Disposition: Discharge home  Discharge Date & Time: 07/10/2017; 1636 hrs.   Physician-requested Follow-up: Return for post-procedure eval (2 wks), w/ Dr. Dossie Arbour.  Future Appointments  Date Time Provider [place]  07/23/2017  9:30 AM Milinda Pointer, MD [facility] None  08/20/2017  8:45 AM Vevelyn Francois, NP [facility] None   Primary Care Physician: Randel Books, FNP Location: [facility] Facility Note by: Gaspar Cola, MD Date: 07/10/2017; Time: 4:41 PM  Disclaimer:  Medicine is not an Chief Strategy Officer. The only guarantee in medicine is that nothing is guaranteed. It is important to note that the decision to proceed with this intervention was based on the information collected from the patient. The Data and conclusions were drawn from the patient's questionnaire, the interview, and the physical examination. Because the information was provided in large part by the patient, it cannot be guaranteed that it has not been purposely or unconsciously manipulated. Every effort has been made to obtain as much relevant data as possible for this evaluation. It is important to note that the conclusions that lead to this procedure are derived in large part from the available data. Always take into account that the  treatment will also be dependent on availability of resources and existing treatment guidelines, considered by other Pain Management Practitioners as being common  knowledge and practice, at the time of the intervention. For Medico-Legal purposes, it is also important to point out that variation in procedural techniques and pharmacological choices are the acceptable norm. The indications, contraindications, technique, and results of the above procedure should only be interpreted and judged by a Board-Certified Interventional Pain Specialist with extensive familiarity and expertise in the same exact procedure and technique.

## 2017-07-10 NOTE — Patient Instructions (Signed)

## 2017-07-11 ENCOUNTER — Telehealth: Payer: Self-pay

## 2017-07-11 NOTE — Telephone Encounter (Signed)
Attempted to call pateient. No answer   Left message to call if needed.

## 2017-07-15 ENCOUNTER — Telehealth: Payer: Self-pay | Admitting: Pain Medicine

## 2017-07-15 NOTE — Telephone Encounter (Signed)
Pharmacy called to verify last script given to patient for Neurontin please use ref #[id number] and call [phone]

## 2017-07-15 NOTE — Telephone Encounter (Signed)
Gabapentin prescription verified with pharmacist.

## 2017-07-17 DIAGNOSIS — E7849 Other hyperlipidemia: Secondary | ICD-10-CM | POA: Diagnosis not present

## 2017-07-17 DIAGNOSIS — E039 Hypothyroidism, unspecified: Secondary | ICD-10-CM | POA: Diagnosis not present

## 2017-07-17 DIAGNOSIS — M545 Low back pain: Secondary | ICD-10-CM | POA: Diagnosis not present

## 2017-07-17 DIAGNOSIS — I10 Essential (primary) hypertension: Secondary | ICD-10-CM | POA: Diagnosis not present

## 2017-07-23 ENCOUNTER — Other Ambulatory Visit: Payer: Self-pay

## 2017-07-23 ENCOUNTER — Encounter: Payer: Self-pay | Admitting: Pain Medicine

## 2017-07-23 ENCOUNTER — Ambulatory Visit: Payer: Medicare Other | Attending: Pain Medicine | Admitting: Pain Medicine

## 2017-07-23 VITALS — BP 123/65 | HR 65 | Temp 97.8°F | Ht 61.0 in | Wt 160.0 lb

## 2017-07-23 DIAGNOSIS — Z9689 Presence of other specified functional implants: Secondary | ICD-10-CM | POA: Insufficient documentation

## 2017-07-23 DIAGNOSIS — M48061 Spinal stenosis, lumbar region without neurogenic claudication: Secondary | ICD-10-CM | POA: Insufficient documentation

## 2017-07-23 DIAGNOSIS — G4700 Insomnia, unspecified: Secondary | ICD-10-CM | POA: Diagnosis not present

## 2017-07-23 DIAGNOSIS — M533 Sacrococcygeal disorders, not elsewhere classified: Secondary | ICD-10-CM | POA: Diagnosis not present

## 2017-07-23 DIAGNOSIS — Z950 Presence of cardiac pacemaker: Secondary | ICD-10-CM | POA: Insufficient documentation

## 2017-07-23 DIAGNOSIS — M109 Gout, unspecified: Secondary | ICD-10-CM | POA: Insufficient documentation

## 2017-07-23 DIAGNOSIS — J449 Chronic obstructive pulmonary disease, unspecified: Secondary | ICD-10-CM | POA: Insufficient documentation

## 2017-07-23 DIAGNOSIS — M810 Age-related osteoporosis without current pathological fracture: Secondary | ICD-10-CM | POA: Insufficient documentation

## 2017-07-23 DIAGNOSIS — I10 Essential (primary) hypertension: Secondary | ICD-10-CM | POA: Diagnosis not present

## 2017-07-23 DIAGNOSIS — K219 Gastro-esophageal reflux disease without esophagitis: Secondary | ICD-10-CM | POA: Diagnosis not present

## 2017-07-23 DIAGNOSIS — R5382 Chronic fatigue, unspecified: Secondary | ICD-10-CM | POA: Insufficient documentation

## 2017-07-23 DIAGNOSIS — Z7951 Long term (current) use of inhaled steroids: Secondary | ICD-10-CM | POA: Diagnosis not present

## 2017-07-23 DIAGNOSIS — Z79891 Long term (current) use of opiate analgesic: Secondary | ICD-10-CM | POA: Insufficient documentation

## 2017-07-23 DIAGNOSIS — G894 Chronic pain syndrome: Secondary | ICD-10-CM | POA: Diagnosis not present

## 2017-07-23 DIAGNOSIS — F411 Generalized anxiety disorder: Secondary | ICD-10-CM | POA: Insufficient documentation

## 2017-07-23 DIAGNOSIS — Z7982 Long term (current) use of aspirin: Secondary | ICD-10-CM | POA: Diagnosis not present

## 2017-07-23 DIAGNOSIS — G8929 Other chronic pain: Secondary | ICD-10-CM | POA: Diagnosis not present

## 2017-07-23 DIAGNOSIS — K449 Diaphragmatic hernia without obstruction or gangrene: Secondary | ICD-10-CM | POA: Insufficient documentation

## 2017-07-23 DIAGNOSIS — F329 Major depressive disorder, single episode, unspecified: Secondary | ICD-10-CM | POA: Insufficient documentation

## 2017-07-23 DIAGNOSIS — M47817 Spondylosis without myelopathy or radiculopathy, lumbosacral region: Secondary | ICD-10-CM | POA: Diagnosis not present

## 2017-07-23 DIAGNOSIS — E669 Obesity, unspecified: Secondary | ICD-10-CM | POA: Insufficient documentation

## 2017-07-23 DIAGNOSIS — M7062 Trochanteric bursitis, left hip: Secondary | ICD-10-CM | POA: Diagnosis not present

## 2017-07-23 DIAGNOSIS — Z79899 Other long term (current) drug therapy: Secondary | ICD-10-CM | POA: Insufficient documentation

## 2017-07-23 DIAGNOSIS — M549 Dorsalgia, unspecified: Secondary | ICD-10-CM | POA: Diagnosis present

## 2017-07-23 DIAGNOSIS — G2581 Restless legs syndrome: Secondary | ICD-10-CM | POA: Diagnosis not present

## 2017-07-23 DIAGNOSIS — M4316 Spondylolisthesis, lumbar region: Secondary | ICD-10-CM | POA: Diagnosis not present

## 2017-07-23 DIAGNOSIS — M47816 Spondylosis without myelopathy or radiculopathy, lumbar region: Secondary | ICD-10-CM | POA: Diagnosis not present

## 2017-07-23 DIAGNOSIS — Z87891 Personal history of nicotine dependence: Secondary | ICD-10-CM | POA: Insufficient documentation

## 2017-07-23 DIAGNOSIS — K589 Irritable bowel syndrome without diarrhea: Secondary | ICD-10-CM | POA: Diagnosis not present

## 2017-07-23 DIAGNOSIS — Z6841 Body Mass Index (BMI) 40.0 and over, adult: Secondary | ICD-10-CM | POA: Insufficient documentation

## 2017-07-23 DIAGNOSIS — M545 Low back pain: Secondary | ICD-10-CM | POA: Diagnosis not present

## 2017-07-23 DIAGNOSIS — M79605 Pain in left leg: Secondary | ICD-10-CM

## 2017-07-23 DIAGNOSIS — M797 Fibromyalgia: Secondary | ICD-10-CM | POA: Insufficient documentation

## 2017-07-23 DIAGNOSIS — M8580 Other specified disorders of bone density and structure, unspecified site: Secondary | ICD-10-CM | POA: Diagnosis not present

## 2017-07-23 NOTE — Patient Instructions (Signed)
____________________________________________________________________________________________  Muscle Spasms & Cramps  Cause:  The most common cause of muscle spasms and cramps is vitamin and/or electrolyte (calcium, potassium, sodium, etc.) deficiencies.  Possible triggers: Sweating - causes loss of electrolytes thru the skin. Steroids - causes loss of electrolytes thru the urine.  Treatment: 1. Gatorade (or any other electrolyte-replenishing drink) - Take 1, 8 oz glass with each meal (3 times a day). 2. OTC (over-the-counter) Magnesium 400 to 500 mg - Take 1 tablet twice a day (one with breakfast and one before bedtime). If you have kidney problems, talk to your primary care physician before taking any Magnesium. 3. Tonic Water with quinine - Take 1, 8 oz glass before bedtime.   ____________________________________________________________________________________________   ____________________________________________________________________________________________  Preparing for Procedure with Sedation  Instructions: . Oral Intake: Do not eat or drink anything for at least 8 hours prior to your procedure. . Transportation: Public transportation is not allowed. Bring an adult driver. The driver must be physically present in our waiting room before any procedure can be started. Marland Kitchen Physical Assistance: Bring an adult physically capable of assisting you, in the event you need help. This adult should keep you company at home for at least 6 hours after the procedure. . Blood Pressure Medicine: Take your blood pressure medicine with a sip of water the morning of the procedure. . Blood thinners:  . Diabetics on insulin: Notify the staff so that you can be scheduled 1st case in the morning. If your diabetes requires high dose insulin, take only  of your normal insulin dose the morning of the procedure and notify the staff that you have done so. . Preventing infections: Shower with an  antibacterial soap the morning of your procedure. . Build-up your immune system: Take 1000 mg of Vitamin C with every meal (3 times a day) the day prior to your procedure. Marland Kitchen Antibiotics: Inform the staff if you have a condition or reason that requires you to take antibiotics before dental procedures. . Pregnancy: If you are pregnant, call and cancel the procedure. . Sickness: If you have a cold, fever, or any active infections, call and cancel the procedure. . Arrival: You must be in the facility at least 30 minutes prior to your scheduled procedure. . Children: Do not bring children with you. . Dress appropriately: Bring dark clothing that you would not mind if they get stained. . Valuables: Do not bring any jewelry or valuables.  Procedure appointments are reserved for interventional treatments only. Marland Kitchen No Prescription Refills. . No medication changes will be discussed during procedure appointments. . No disability issues will be discussed.  Remember:  Regular Business hours are:  Monday to Thursday 8:00 AM to 4:00 PM  Provider's Schedule: Milinda Pointer, MD:  Procedure days: Tuesday and Thursday 7:30 AM to 4:00 PM  Gillis Santa, MD:  Procedure days: Monday and Wednesday 7:30 AM to 4:00 PM ____________________________________________________________________________________________
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
## 2017-08-20 ENCOUNTER — Ambulatory Visit: Payer: Medicare Other | Attending: Nurse Practitioner | Admitting: Nurse Practitioner

## 2017-08-20 ENCOUNTER — Encounter: Payer: Self-pay | Admitting: Nurse Practitioner

## 2017-08-20 VITALS — BP 143/84 | HR 57 | Temp 97.6°F | Resp 16 | Ht 61.0 in | Wt 160.0 lb

## 2017-08-20 DIAGNOSIS — Z201 Contact with and (suspected) exposure to tuberculosis: Secondary | ICD-10-CM | POA: Insufficient documentation

## 2017-08-20 DIAGNOSIS — G4700 Insomnia, unspecified: Secondary | ICD-10-CM | POA: Insufficient documentation

## 2017-08-20 DIAGNOSIS — M25552 Pain in left hip: Secondary | ICD-10-CM | POA: Diagnosis not present

## 2017-08-20 DIAGNOSIS — M25551 Pain in right hip: Secondary | ICD-10-CM | POA: Diagnosis not present

## 2017-08-20 DIAGNOSIS — Z8249 Family history of ischemic heart disease and other diseases of the circulatory system: Secondary | ICD-10-CM | POA: Insufficient documentation

## 2017-08-20 DIAGNOSIS — M545 Low back pain: Secondary | ICD-10-CM | POA: Diagnosis not present

## 2017-08-20 DIAGNOSIS — Z950 Presence of cardiac pacemaker: Secondary | ICD-10-CM | POA: Insufficient documentation

## 2017-08-20 DIAGNOSIS — M810 Age-related osteoporosis without current pathological fracture: Secondary | ICD-10-CM | POA: Insufficient documentation

## 2017-08-20 DIAGNOSIS — Z803 Family history of malignant neoplasm of breast: Secondary | ICD-10-CM | POA: Insufficient documentation

## 2017-08-20 DIAGNOSIS — Z5181 Encounter for therapeutic drug level monitoring: Secondary | ICD-10-CM | POA: Diagnosis not present

## 2017-08-20 DIAGNOSIS — G894 Chronic pain syndrome: Secondary | ICD-10-CM | POA: Diagnosis not present

## 2017-08-20 DIAGNOSIS — I10 Essential (primary) hypertension: Secondary | ICD-10-CM | POA: Insufficient documentation

## 2017-08-20 DIAGNOSIS — K589 Irritable bowel syndrome without diarrhea: Secondary | ICD-10-CM | POA: Insufficient documentation

## 2017-08-20 DIAGNOSIS — Z9013 Acquired absence of bilateral breasts and nipples: Secondary | ICD-10-CM | POA: Insufficient documentation

## 2017-08-20 DIAGNOSIS — Z9049 Acquired absence of other specified parts of digestive tract: Secondary | ICD-10-CM | POA: Insufficient documentation

## 2017-08-20 DIAGNOSIS — M961 Postlaminectomy syndrome, not elsewhere classified: Secondary | ICD-10-CM | POA: Insufficient documentation

## 2017-08-20 DIAGNOSIS — G2581 Restless legs syndrome: Secondary | ICD-10-CM | POA: Diagnosis not present

## 2017-08-20 DIAGNOSIS — E669 Obesity, unspecified: Secondary | ICD-10-CM | POA: Insufficient documentation

## 2017-08-20 DIAGNOSIS — Z79891 Long term (current) use of opiate analgesic: Secondary | ICD-10-CM | POA: Insufficient documentation

## 2017-08-20 DIAGNOSIS — M48061 Spinal stenosis, lumbar region without neurogenic claudication: Secondary | ICD-10-CM | POA: Diagnosis not present

## 2017-08-20 DIAGNOSIS — Z885 Allergy status to narcotic agent status: Secondary | ICD-10-CM | POA: Insufficient documentation

## 2017-08-20 DIAGNOSIS — K449 Diaphragmatic hernia without obstruction or gangrene: Secondary | ICD-10-CM | POA: Diagnosis not present

## 2017-08-20 DIAGNOSIS — Z880 Allergy status to penicillin: Secondary | ICD-10-CM | POA: Insufficient documentation

## 2017-08-20 DIAGNOSIS — Z888 Allergy status to other drugs, medicaments and biological substances status: Secondary | ICD-10-CM | POA: Insufficient documentation

## 2017-08-20 DIAGNOSIS — R209 Unspecified disturbances of skin sensation: Secondary | ICD-10-CM | POA: Insufficient documentation

## 2017-08-20 DIAGNOSIS — K219 Gastro-esophageal reflux disease without esophagitis: Secondary | ICD-10-CM | POA: Diagnosis not present

## 2017-08-20 DIAGNOSIS — R5382 Chronic fatigue, unspecified: Secondary | ICD-10-CM | POA: Diagnosis not present

## 2017-08-20 DIAGNOSIS — M797 Fibromyalgia: Secondary | ICD-10-CM | POA: Insufficient documentation

## 2017-08-20 DIAGNOSIS — M7062 Trochanteric bursitis, left hip: Secondary | ICD-10-CM | POA: Diagnosis not present

## 2017-08-20 DIAGNOSIS — Z7982 Long term (current) use of aspirin: Secondary | ICD-10-CM | POA: Insufficient documentation

## 2017-08-20 DIAGNOSIS — J449 Chronic obstructive pulmonary disease, unspecified: Secondary | ICD-10-CM | POA: Diagnosis not present

## 2017-08-20 DIAGNOSIS — Z87891 Personal history of nicotine dependence: Secondary | ICD-10-CM | POA: Insufficient documentation

## 2017-08-20 DIAGNOSIS — Z79899 Other long term (current) drug therapy: Secondary | ICD-10-CM | POA: Insufficient documentation

## 2017-08-20 DIAGNOSIS — M79604 Pain in right leg: Secondary | ICD-10-CM | POA: Insufficient documentation

## 2017-08-20 DIAGNOSIS — M792 Neuralgia and neuritis, unspecified: Secondary | ICD-10-CM

## 2017-08-20 DIAGNOSIS — F411 Generalized anxiety disorder: Secondary | ICD-10-CM | POA: Insufficient documentation

## 2017-08-20 DIAGNOSIS — M47816 Spondylosis without myelopathy or radiculopathy, lumbar region: Secondary | ICD-10-CM

## 2017-08-20 DIAGNOSIS — M109 Gout, unspecified: Secondary | ICD-10-CM | POA: Insufficient documentation

## 2017-08-20 DIAGNOSIS — E785 Hyperlipidemia, unspecified: Secondary | ICD-10-CM | POA: Insufficient documentation

## 2017-08-20 DIAGNOSIS — F329 Major depressive disorder, single episode, unspecified: Secondary | ICD-10-CM | POA: Insufficient documentation

## 2017-08-20 DIAGNOSIS — M79605 Pain in left leg: Secondary | ICD-10-CM | POA: Diagnosis not present

## 2017-08-20 DIAGNOSIS — Z7983 Long term (current) use of bisphosphonates: Secondary | ICD-10-CM | POA: Insufficient documentation

## 2017-08-20 DIAGNOSIS — Z8371 Family history of colonic polyps: Secondary | ICD-10-CM | POA: Insufficient documentation

## 2017-08-20 MED ORDER — TIZANIDINE HCL 4 MG PO TABS: 4.0000 mg | ORAL_TABLET | Freq: Four times a day (QID) | ORAL | 2 refills | Status: DC | PRN

## 2017-08-20 MED ORDER — OXYCODONE HCL 5 MG PO TABS: 5.0000 mg | ORAL_TABLET | Freq: Four times a day (QID) | ORAL | 0 refills | Status: DC | PRN

## 2017-08-20 MED ORDER — OXYCODONE HCL 5 MG PO TABS
5.0000 mg | ORAL_TABLET | Freq: Four times a day (QID) | ORAL | 0 refills | Status: DC | PRN
Start: 2017-08-30 — End: 2017-10-30

## 2017-08-20 MED ORDER — GABAPENTIN 300 MG PO CAPS: 300.0000 mg | ORAL_CAPSULE | Freq: Four times a day (QID) | ORAL | 2 refills | Status: DC

## 2017-08-20 NOTE — Progress Notes (Signed)
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   ____________________________________________________________________________________________  Medication Rules  Applies to: All patients receiving prescriptions (written or electronic).  Pharmacy of record: Pharmacy where electronic prescriptions will be sent. If written prescriptions are taken to a different pharmacy, please inform the nursing staff. The pharmacy listed in the electronic medical record should be the one where you would like electronic prescriptions to be sent.  Prescription refills: Only during scheduled appointments. Applies to both, written and electronic prescriptions.  NOTE: The following applies primarily to controlled substances (Opioid* Pain Medications).   Patient's responsibilities: 1. Pain Pills: Bring all pain pills to every appointment (except for procedure appointments). 2. Pill Bottles: Bring pills in original pharmacy bottle. Always bring newest bottle. Bring bottle, even if  empty. 3. Medication refills: You are responsible for knowing and keeping track of what medications you need refilled. The day before your appointment, write a list of all prescriptions that need to be refilled. Bring that list to your appointment and give it to the admitting nurse. Prescriptions will be written only during appointments. If you forget a medication, it will not be "Called in", "Faxed", or "electronically sent". You will need to get another appointment to get these prescribed. 4. Prescription Accuracy: You are responsible for carefully inspecting your prescriptions before leaving our office. Have the discharge nurse carefully go over each prescription with you, before taking them home. Make sure that your name is accurately spelled, that your address is correct. Check the name and dose of your medication to make sure it is accurate. Check the number of pills, and the written instructions to make sure they are clear and accurate. Make sure that you are given enough medication to last until your next medication refill appointment. 5. Taking Medication: Take medication as prescribed. Never take more pills than instructed. Never take medication more frequently than prescribed. Taking less pills or less frequently is permitted and encouraged, when it comes to controlled substances (written prescriptions).  6. Inform other Doctors: Always inform, all of your  healthcare providers, of all the medications you take. 7. Pain Medication from other Providers: You are not allowed to accept any additional pain medication from any other Doctor or Healthcare provider. There are two exceptions to this rule. (see below) In the event that you require additional pain medication, you are responsible for notifying us, as stated below. 8. Medication Agreement: You are responsible for carefully reading and following our Medication Agreement. This must be signed before receiving any prescriptions from our practice.  Safely store a copy of your signed Agreement. Violations to the Agreement will result in no further prescriptions. (Additional copies of our Medication Agreement are available upon request.) 9. Laws, Rules, & Regulations: All patients are expected to follow all Federal and [employer], Rules, [employer]. Ignorance of the Laws does not constitute a valid excuse. The use of any illegal substances is prohibited. 10. Adopted CDC guidelines & recommendations: Target dosing levels will be at or below 60 MME/day. Use of benzodiazepines** is not recommended.  Exceptions: There are only two exceptions to the rule of not receiving pain medications from other Healthcare Providers. 1. Exception #1 (Emergencies): In the event of an emergency (i.e.: accident requiring emergency care), you are allowed to receive additional pain medication. However, you are responsible for: As soon as you are able, call our office [phone], at any time of the day or night, and leave a message stating your name, the date and nature of the emergency, and the name and dose of the medication prescribed. In the event that your call is answered by a member of our staff, make sure to document and save the date, time, and the name of the person that took your information.  2. Exception #2 (Planned Surgery): In the event that you are scheduled by another doctor or dentist to have any type of surgery or procedure, you are allowed (for a period no longer than 30 days), to receive additional pain medication, for the acute post-op pain. However, in this case, you are responsible for picking up a copy of our "Post-op Pain Management for Surgeons" handout, and giving it to your surgeon or dentist. This document is available at our office, and does not require an appointment to obtain it. Simply go to our office during business hours (Monday-Thursday from 8:00 AM to 4:00 PM) (Friday 8:00 AM to 12:00 Noon) or if you have a scheduled  appointment with [place], prior to your surgery, and ask for it by name. In addition, you will need to provide [place] with your name, name of your surgeon, type of surgery, and date of procedure or surgery.  *Opioid medications include: morphine, codeine, oxycodone, oxymorphone, hydrocodone, hydromorphone, meperidine, tramadol, tapentadol, buprenorphine, fentanyl, methadone. **Benzodiazepine medications include: diazepam (Valium), alprazolam (Xanax), clonazepam (Klonopine), lorazepam (Ativan), clorazepate (Tranxene), chlordiazepoxide (Librium), estazolam (Prosom), oxazepam (Serax), temazepam (Restoril), triazolam (Halcion) (Last updated: 03/27/2017) ____________________________________________________________________________________________    BMI Assessment: Estimated body mass index is 30.23 kg/m as calculated from the following:   Height as of this encounter: _0  (1.549 m).   Weight as of this encounter: 160 lb (72.6 kg).  BMI interpretation table: BMI level Category Range association with higher incidence of chronic pain  <18 kg/m2 Underweight   18.5-24.9 kg/m2 Ideal body weight   25-29.9 kg/m2 Overweight Increased incidence by 20%  30-34.9 kg/m2 Obese (Class I) Increased incidence by 68%  35-39.9 kg/m2 Severe obesity (Class II) Increased incidence by 136%  >40 kg/m2 Extreme obesity (Class III) Increased incidence by 254%  Patient's current BMI Ideal Body weight  Body mass index is 30.23 kg/m. Ideal body weight: 47.8 kg (105 lb 6.1 oz) Adjusted ideal body weight: 57.7 kg (127 lb 3.7 oz)   BMI Readings from Last 4 Encounters:  08/20/17 30.23 kg/m  07/23/17 30.23 kg/m  07/10/17 29.48 kg/m  06/11/17 28.53 kg/m   Wt Readings from Last 4 Encounters:  08/20/17 160 lb (72.6 kg)  07/23/17 160 lb (72.6 kg)  07/10/17 156 lb (70.8 kg)  06/11/17 156 lb (70.8 kg)

## 2017-08-20 NOTE — Progress Notes (Signed)
Nursing Pain Medication Assessment:  Safety precautions to be maintained throughout the outpatient stay will include: orient to surroundings, keep bed in low position, maintain call bell within reach at all times, provide assistance with transfer out of bed and ambulation.  Medication Inspection Compliance: Pill count conducted under aseptic conditions, in front of the patient. Neither the pills nor the bottle was removed from the patient's sight at any time. Once count was completed pills were immediately returned to the patient in their original bottle.  Medication: Oxycodone IR Pill/Patch Count: 49 of 120 pills remain Pill/Patch Appearance: Markings consistent with prescribed medication Bottle Appearance: Standard pharmacy container. Clearly labeled. Filled Date: 07 / 02 / 2019 Last Medication intake:  Today

## 2017-08-20 NOTE — Patient Instructions (Addendum)
____________________________________________________________________________________________  Medication Rules  Applies to: All patients receiving prescriptions (written or electronic).  Pharmacy of record: Pharmacy where electronic prescriptions will be sent. If written prescriptions are taken to a different pharmacy, please inform the nursing staff. The pharmacy listed in the electronic medical record should be the one where you would like electronic prescriptions to be sent.  Prescription refills: Only during scheduled appointments. Applies to both, written and electronic prescriptions.  NOTE: The following applies primarily to controlled substances (Opioid* Pain Medications).   Patient's responsibilities: 1. Pain Pills: Bring all pain pills to every appointment (except for procedure appointments). 2. Pill Bottles: Bring pills in original pharmacy bottle. Always bring newest bottle. Bring bottle, even if empty. 3. Medication refills: You are responsible for knowing and keeping track of what medications you need refilled. The day before your appointment, write a list of all prescriptions that need to be refilled. Bring that list to your appointment and give it to the admitting nurse. Prescriptions will be written only during appointments. If you forget a medication, it will not be "Called in", "Faxed", or "electronically sent". You will need to get another appointment to get these prescribed. 4. Prescription Accuracy: You are responsible for carefully inspecting your prescriptions before leaving our office. Have the discharge nurse carefully go over each prescription with you, before taking them home. Make sure that your name is accurately spelled, that your address is correct. Check the name and dose of your medication to make sure it is accurate. Check the number of pills, and the written instructions to make sure they are clear and accurate. Make sure that you are given enough medication to last  until your next medication refill appointment. 5. Taking Medication: Take medication as prescribed. Never take more pills than instructed. Never take medication more frequently than prescribed. Taking less pills or less frequently is permitted and encouraged, when it comes to controlled substances (written prescriptions).  6. Inform other Doctors: Always inform, all of your healthcare providers, of all the medications you take. 7. Pain Medication from other Providers: You are not allowed to accept any additional pain medication from any other Doctor or Healthcare provider. There are two exceptions to this rule. (see below) In the event that you require additional pain medication, you are responsible for notifying us, as stated below. 8. Medication Agreement: You are responsible for carefully reading and following our Medication Agreement. This must be signed before receiving any prescriptions from our practice. Safely store a copy of your signed Agreement. Violations to the Agreement will result in no further prescriptions. (Additional copies of our Medication Agreement are available upon request.) 9. Laws, Rules, & Regulations: All patients are expected to follow all Federal and State Laws, Statutes, Rules, & Regulations. Ignorance of the Laws does not constitute a valid excuse. The use of any illegal substances is prohibited. 10. Adopted CDC guidelines & recommendations: Target dosing levels will be at or below 60 MME/day. Use of benzodiazepines** is not recommended.  Exceptions: There are only two exceptions to the rule of not receiving pain medications from other Healthcare Providers. 1. Exception #1 (Emergencies): In the event of an emergency (i.e.: accident requiring emergency care), you are allowed to receive additional pain medication. However, you are responsible for: As soon as you are able, call our office [phone], at any time of the day or night, and leave a message stating your name, the  date and nature of the emergency, and the name and dose of the medication   prescribed. In the event that your call is answered by a member of our staff, make sure to document and save the date, time, and the name of the person that took your information.  2. Exception #2 (Planned Surgery): In the event that you are scheduled by another doctor or dentist to have any type of surgery or procedure, you are allowed (for a period no longer than 30 days), to receive additional pain medication, for the acute post-op pain. However, in this case, you are responsible for picking up a copy of our "Post-op Pain Management for Surgeons" handout, and giving it to your surgeon or dentist. This document is available at our office, and does not require an appointment to obtain it. Simply go to our office during business hours (Monday-Thursday from 8:00 AM to 4:00 PM) (Friday 8:00 AM to 12:00 Noon) or if you have a scheduled appointment with [place], prior to your surgery, and ask for it by name. In addition, you will need to provide [place] with your name, name of your surgeon, type of surgery, and date of procedure or surgery.  *Opioid medications include: morphine, codeine, oxycodone, oxymorphone, hydrocodone, hydromorphone, meperidine, tramadol, tapentadol, buprenorphine, fentanyl, methadone. **Benzodiazepine medications include: diazepam (Valium), alprazolam (Xanax), clonazepam (Klonopine), lorazepam (Ativan), clorazepate (Tranxene), chlordiazepoxide (Librium), estazolam (Prosom), oxazepam (Serax), temazepam (Restoril), triazolam (Halcion) (Last updated: 03/27/2017) ____________________________________________________________________________________________    BMI Assessment: Estimated body mass index is 30.23 kg/m as calculated from the following:   Height as of this encounter: 5\' 1"  (1.549 m).   Weight as of this encounter: 160 lb (72.6 kg).  BMI interpretation table: BMI level Category Range association with higher  incidence of chronic pain  <18 kg/m2 Underweight   18.5-24.9 kg/m2 Ideal body weight   25-29.9 kg/m2 Overweight Increased incidence by 20%  30-34.9 kg/m2 Obese (Class I) Increased incidence by 68%  35-39.9 kg/m2 Severe obesity (Class II) Increased incidence by 136%  >40 kg/m2 Extreme obesity (Class III) Increased incidence by 254%   Patient's current BMI Ideal Body weight  Body mass index is 30.23 kg/m. Ideal body weight: 47.8 kg (105 lb 6.1 oz) Adjusted ideal body weight: 57.7 kg (127 lb 3.7 oz)   BMI Readings from Last 4 Encounters:  08/20/17 30.23 kg/m  07/23/17 30.23 kg/m  07/10/17 29.48 kg/m  06/11/17 28.53 kg/m   Wt Readings from Last 4 Encounters:  08/20/17 160 lb (72.6 kg)  07/23/17 160 lb (72.6 kg)  07/10/17 156 lb (70.8 kg)  06/11/17 156 lb (70.8 kg)

## 2017-09-18 ENCOUNTER — Ambulatory Visit (HOSPITAL_BASED_OUTPATIENT_CLINIC_OR_DEPARTMENT_OTHER): Payer: Medicare Other | Admitting: Pain Medicine

## 2017-09-18 ENCOUNTER — Other Ambulatory Visit: Payer: Self-pay

## 2017-09-18 ENCOUNTER — Encounter: Payer: Self-pay | Admitting: Pain Medicine

## 2017-09-18 ENCOUNTER — Ambulatory Visit
Admission: RE | Admit: 2017-09-18 | Discharge: 2017-09-18 | Disposition: A | Discharge status: Home / Self Care | Payer: Medicare Other | Source: Ambulatory Visit | Attending: Pain Medicine | Admitting: Pain Medicine

## 2017-09-18 VITALS — BP 110/80 | HR 59 | Temp 97.1°F | Resp 13 | Ht 61.0 in | Wt 158.0 lb

## 2017-09-18 DIAGNOSIS — G8929 Other chronic pain: Secondary | ICD-10-CM | POA: Diagnosis not present

## 2017-09-18 DIAGNOSIS — Z880 Allergy status to penicillin: Secondary | ICD-10-CM | POA: Diagnosis not present

## 2017-09-18 DIAGNOSIS — M545 Low back pain, unspecified: Secondary | ICD-10-CM

## 2017-09-18 DIAGNOSIS — Z885 Allergy status to narcotic agent status: Secondary | ICD-10-CM | POA: Diagnosis not present

## 2017-09-18 DIAGNOSIS — M47817 Spondylosis without myelopathy or radiculopathy, lumbosacral region: Secondary | ICD-10-CM | POA: Insufficient documentation

## 2017-09-18 DIAGNOSIS — M47816 Spondylosis without myelopathy or radiculopathy, lumbar region: Secondary | ICD-10-CM

## 2017-09-18 DIAGNOSIS — G8918 Other acute postprocedural pain: Secondary | ICD-10-CM

## 2017-09-18 MED ORDER — ROPIVACAINE HCL 2 MG/ML IJ SOLN
9.0000 mL | Freq: Once | INTRAMUSCULAR | Status: AC
  Administered 2017-09-18: 9 mL via PERINEURAL
  Filled 2017-09-18: qty 10

## 2017-09-18 MED ORDER — LACTATED RINGERS IV SOLN
1000.0000 mL | Freq: Once | INTRAVENOUS | Status: AC
  Administered 2017-09-18: 1000 mL via INTRAVENOUS

## 2017-09-18 MED ORDER — LIDOCAINE HCL 2 % IJ SOLN
20.0000 mL | Freq: Once | INTRAMUSCULAR | Status: AC
  Administered 2017-09-18: 400 mg
  Filled 2017-09-18: qty 40

## 2017-09-18 MED ORDER — MIDAZOLAM HCL 5 MG/5ML IJ SOLN
1.0000 mg | INTRAMUSCULAR | Status: DC | PRN
  Administered 2017-09-18: 3 mg via INTRAVENOUS
  Filled 2017-09-18: qty 5

## 2017-09-18 MED ORDER — OXYCODONE-ACETAMINOPHEN 5-325 MG PO TABS: 1.0000 | ORAL_TABLET | Freq: Three times a day (TID) | ORAL | 0 refills | Status: AC | PRN

## 2017-09-18 MED ORDER — FENTANYL CITRATE (PF) 100 MCG/2ML IJ SOLN
25.0000 ug | INTRAMUSCULAR | Status: DC | PRN
  Administered 2017-09-18: 100 ug via INTRAVENOUS
  Filled 2017-09-18: qty 2

## 2017-09-18 MED ORDER — TRIAMCINOLONE ACETONIDE 40 MG/ML IJ SUSP
40.0000 mg | Freq: Once | INTRAMUSCULAR | Status: AC
  Administered 2017-09-18: 40 mg
  Filled 2017-09-18: qty 1

## 2017-09-18 NOTE — Progress Notes (Signed)
Safety precautions to be maintained throughout the outpatient stay will include: orient to surroundings, keep bed in low position, maintain call bell within reach at all times, provide assistance with transfer out of bed and ambulation.  

## 2017-09-18 NOTE — Patient Instructions (Signed)

## 2017-09-18 NOTE — Progress Notes (Signed)
Patient's Name: [name]  MRN: [id number]  Referring Provider: Randel Books, FNP  DOB: 10/19/1945  PCP: Randel Books, FNP  DOS: 09/18/2017  Note by: Gaspar Cola, MD  Service setting: Ambulatory outpatient  Specialty: Interventional Pain Management  Patient type: Established  Location: [facility] (AMB) Pain Management Facility  Visit type: Interventional Procedure   Primary Reason for Visit: Interventional Pain Management Treatment. CC: Back Pain  Procedure:          Anesthesia, Analgesia, Anxiolysis:  Type: Thermal Lumbar Facet, Medial Branch Radiofrequency Ablation/Neurotomy #2 Level: L2, L3, L4, L5, & S1 Medial Branch Level(s). These levels will denervate the L3-4, L4-5, and the L5-S1 lumbar facet joints. Primary Purpose: Therapeutic Region: Posterolateral Lumbosacral Spine Laterality: Right  Type: Moderate (Conscious) Sedation combined with Local Anesthesia Indication(s): Analgesia and Anxiety Route: Intravenous (IV) IV Access: Secured Sedation: Meaningful verbal contact was maintained at all times during the procedure  Local Anesthetic: Lidocaine 1-2%   Indications: 1. Spondylosis without myelopathy or radiculopathy, lumbosacral region   2. Lumbar facet hypertrophy (L4-5)   3. Lumbar facet syndrome (Bilateral) (L>R)   4. Lumbar facet arthropathy ([place])   5. Chronic low back pain (Primary Source of Pain) (Bilateral) (L>R)    [name] has been dealing with the above chronic pain for longer than three months and has either failed to respond, was unable to tolerate, or simply did not get enough benefit from other more conservative therapies including, but not limited to: 1. Over-the-counter medications 2. Anti-inflammatory medications 3. Muscle relaxants 4. Membrane stabilizers 5. Opioids 6. Physical therapy 7. Modalities (Heat, ice, etc.) 8. Invasive techniques such as nerve blocks. Ms. Chain has attained more than 50% relief of the pain from a  series of diagnostic injections conducted in separate occasions.  Pain Score: Pre-procedure: 5 /10 Post-procedure: 0-No pain/10  Pre-op Assessment:  [name] is a 72 y.o. (year old), female patient, seen today for interventional treatment. She  has a past surgical history that includes Vaginal hysterectomy (1975); Incontinence surgery (2001); Oophorectomy (1978); Elbow Arthroplasty (1990); Cholecystectomy (1989); Rectocele repair (2009); back implant (2015); Mastectomy partial / lumpectomy (Bilateral, 1978 ); Augmentation mammaplasty (Bilateral, 1978); and Colonoscopy with propofol (N/A, 01/29/2017). [name] has a current medication list which includes the following prescription(s): albuterol, alendronate, alprazolam, aspirin ec, atenolol, calcium, cholecalciferol, cinnamon, citalopram, conjugated estrogens, fenofibrate, fish oil-flax oil-borage oil, fluticasone-salmeterol, furosemide, gabapentin, meclizine, melatonin, vitamin k2, montelukast, omega 3-6-9 complex, oxycodone, oxycodone, oxycodone, ropinirole, tizanidine, zolpidem, alendronate, oxycodone-acetaminophen, and oxycodone-acetaminophen, and the following Facility-Administered Medications: fentanyl and midazolam. Her primarily concern today is the Back Pain  Initial Vital Signs:  Pulse/HCG Rate: (!) 59ECG Heart Rate: 62 Temp: 98.1 F (36.7 C) Resp: 16 BP: 140/77 SpO2: 96 %  BMI: Estimated body mass index is 29.85 kg/m as calculated from the following:   Height as of this encounter: 5\' 1"  (1.549 m).   Weight as of this encounter: 158 lb (71.7 kg).  Risk Assessment: Allergies: Reviewed. She is allergic to morphine; vicodin [hydrocodone-acetaminophen]; niacin and related; and penicillins.  Allergy Precautions: None required Coagulopathies: Reviewed. None identified.  Blood-thinner therapy: None at this time Active Infection(s): Reviewed. None identified. [name] is afebrile  Site Confirmation: Ms. Innocent was asked  to confirm the procedure and laterality before marking the site Procedure checklist: Completed Consent: Before the procedure and under the influence of no sedative(s), amnesic(s), or anxiolytics, the patient was informed of the treatment options, risks and possible complications. To fulfill our ethical and  legal obligations, as recommended by the American Medical Association's Code of Ethics, I have informed the patient of my clinical impression; the nature and purpose of the treatment or procedure; the risks, benefits, and possible complications of the intervention; the alternatives, including doing nothing; the risk(s) and benefit(s) of the alternative treatment(s) or procedure(s); and the risk(s) and benefit(s) of doing nothing. The patient was provided information about the general risks and possible complications associated with the procedure. These may include, but are not limited to: failure to achieve desired goals, infection, bleeding, organ or nerve damage, allergic reactions, paralysis, and death. In addition, the patient was informed of those risks and complications associated to Spine-related procedures, such as failure to decrease pain; infection (i.e.: Meningitis, epidural or intraspinal abscess); bleeding (i.e.: epidural hematoma, subarachnoid hemorrhage, or any other type of intraspinal or peri-dural bleeding); organ or nerve damage (i.e.: Any type of peripheral nerve, nerve root, or spinal cord injury) with subsequent damage to sensory, motor, and/or autonomic systems, resulting in permanent pain, numbness, and/or weakness of one or several areas of the body; allergic reactions; (i.e.: anaphylactic reaction); and/or death. Furthermore, the patient was informed of those risks and complications associated with the medications. These include, but are not limited to: allergic reactions (i.e.: anaphylactic or anaphylactoid reaction(s)); adrenal axis suppression; blood sugar elevation that in  diabetics may result in ketoacidosis or comma; water retention that in patients with history of congestive heart failure may result in shortness of breath, pulmonary edema, and decompensation with resultant heart failure; weight gain; swelling or edema; medication-induced neural toxicity; particulate matter embolism and blood vessel occlusion with resultant organ, and/or nervous system infarction; and/or aseptic necrosis of one or more joints. Finally, the patient was informed that Medicine is not an exact science; therefore, there is also the possibility of unforeseen or unpredictable risks and/or possible complications that may result in a catastrophic outcome. The patient indicated having understood very clearly. We have given the patient no guarantees and we have made no promises. Enough time was given to the patient to ask questions, all of which were answered to the patient's satisfaction. Ms. Stoffel has indicated that she wanted to continue with the procedure. Attestation: I, the ordering provider, attest that I have discussed with the patient the benefits, risks, side-effects, alternatives, likelihood of achieving goals, and potential problems during recovery for the procedure that I have provided informed consent. Date  Time: 09/18/2017  8:25 AM  Pre-Procedure Preparation:  Monitoring: As per clinic protocol. Respiration, ETCO2, SpO2, BP, heart rate and rhythm monitor placed and checked for adequate function Safety Precautions: Patient was assessed for positional comfort and pressure points before starting the procedure. Time-out: I initiated and conducted the "Time-out" before starting the procedure, as per protocol. The patient was asked to participate by confirming the accuracy of the "Time Out" information. Verification of the correct person, site, and procedure were performed and confirmed by me, the nursing staff, and the patient. "Time-out" conducted as per Joint Commission's Universal  Protocol (UP.01.01.01). Time: 0938  Description of Procedure:          Position: Prone Laterality: Right Levels:  L2, L3, L4, L5, & S1 Medial Branch Level(s), at the L3-4, L4-5, and the L5-S1 lumbar facet joints. Area Prepped: Lumbosacral Prepping solution: ChloraPrep (2% chlorhexidine gluconate and 70% isopropyl alcohol) Safety Precautions: Aspiration looking for blood return was conducted prior to all injections. At no point did we inject any substances, as a needle was being advanced. Before injecting, the patient  was told to immediately notify me if she was experiencing any new onset of "ringing in the ears, or metallic taste in the mouth". No attempts were made at seeking any paresthesias. Safe injection practices and needle disposal techniques used. Medications properly checked for expiration dates. SDV (single dose vial) medications used. After the completion of the procedure, all disposable equipment used was discarded in the proper designated medical waste containers. Local Anesthesia: Protocol guidelines were followed. The patient was positioned over the fluoroscopy table. The area was prepped in the usual manner. The time-out was completed. The target area was identified using fluoroscopy. A 12-in long, straight, sterile hemostat was used with fluoroscopic guidance to locate the targets for each level blocked. Once located, the skin was marked with an approved surgical skin marker. Once all sites were marked, the skin (epidermis, dermis, and hypodermis), as well as deeper tissues (fat, connective tissue and muscle) were infiltrated with a small amount of a short-acting local anesthetic, loaded on a 10cc syringe with a 25G, 1.5-in  Needle. An appropriate amount of time was allowed for local anesthetics to take effect before proceeding to the next step. Local Anesthetic: Lidocaine 2.0% The unused portion of the local anesthetic was discarded in the proper designated containers. Technical  explanation of process:  Radiofrequency Ablation (RFA) L2 Medial Branch Nerve RFA: The target area for the L2 medial branch is at the junction of the postero-lateral aspect of the superior articular process and the superior, posterior, and medial edge of the transverse process of L3. Under fluoroscopic guidance, a Radiofrequency needle was inserted until contact was made with os over the superior postero-lateral aspect of the pedicular shadow (target area). Sensory and motor testing was conducted to properly adjust the position of the needle. Once satisfactory placement of the needle was achieved, the numbing solution was slowly injected after negative aspiration for blood. 2.0 mL of the nerve block solution was injected without difficulty or complication. After waiting for at least 3 minutes, the ablation was performed. Once completed, the needle was removed intact. L3 Medial Branch Nerve RFA: The target area for the L3 medial branch is at the junction of the postero-lateral aspect of the superior articular process and the superior, posterior, and medial edge of the transverse process of L4. Under fluoroscopic guidance, a Radiofrequency needle was inserted until contact was made with os over the superior postero-lateral aspect of the pedicular shadow (target area). Sensory and motor testing was conducted to properly adjust the position of the needle. Once satisfactory placement of the needle was achieved, the numbing solution was slowly injected after negative aspiration for blood. 2.0 mL of the nerve block solution was injected without difficulty or complication. After waiting for at least 3 minutes, the ablation was performed. Once completed, the needle was removed intact. L4 Medial Branch Nerve RFA: The target area for the L4 medial branch is at the junction of the postero-lateral aspect of the superior articular process and the superior, posterior, and medial edge of the transverse process of L5. Under  fluoroscopic guidance, a Radiofrequency needle was inserted until contact was made with os over the superior postero-lateral aspect of the pedicular shadow (target area). Sensory and motor testing was conducted to properly adjust the position of the needle. Once satisfactory placement of the needle was achieved, the numbing solution was slowly injected after negative aspiration for blood. 2.0 mL of the nerve block solution was injected without difficulty or complication. After waiting for at least 3 minutes, the ablation  was performed. Once completed, the needle was removed intact. L5 Medial Branch Nerve RFA: The target area for the L5 medial branch is at the junction of the postero-lateral aspect of the superior articular process of S1 and the superior, posterior, and medial edge of the sacral ala. Under fluoroscopic guidance, a Radiofrequency needle was inserted until contact was made with os over the superior postero-lateral aspect of the pedicular shadow (target area). Sensory and motor testing was conducted to properly adjust the position of the needle. Once satisfactory placement of the needle was achieved, the numbing solution was slowly injected after negative aspiration for blood. 2.0 mL of the nerve block solution was injected without difficulty or complication. After waiting for at least 3 minutes, the ablation was performed. Once completed, the needle was removed intact. S1 Medial Branch Nerve RFA: The target area for the S1 medial branch is located inferior to the junction of the S1 superior articular process and the L5 inferior articular process, posterior, inferior, and lateral to the 6 o'clock position of the L5-S1 facet joint, just superior to the S1 posterior foramen. Under fluoroscopic guidance, the Radiofrequency needle was advanced until contact was made with os over the Target area. Sensory and motor testing was conducted to properly adjust the position of the needle. Once satisfactory  placement of the needle was achieved, the numbing solution was slowly injected after negative aspiration for blood. 2.0 mL of the nerve block solution was injected without difficulty or complication. After waiting for at least 3 minutes, the ablation was performed. Once completed, the needle was removed intact. Radiofrequency lesioning (ablation):  Radiofrequency Generator: NeuroTherm [id number] Sensory Stimulation Parameters: 50 Hz was used to locate & identify the nerve, making sure that the needle was positioned such that there was no sensory stimulation below 0.3 V or above 0.7 V. Motor Stimulation Parameters: 2 Hz was used to evaluate the motor component. Care was taken not to lesion any nerves that demonstrated motor stimulation of the lower extremities at an output of less than 2.5 times that of the sensory threshold, or a maximum of 2.0 V. Lesioning Technique Parameters: Standard Radiofrequency settings. (Not bipolar or pulsed.) Temperature Settings: 80 degrees C Lesioning time: 60 seconds Intra-operative Compliance: Compliant Materials & Medications: Needle(s) (Electrode/Cannula) Type: Teflon-coated, curved tip, Radiofrequency needle(s) Gauge: 22G Length: 10cm Numbing solution: 0.2% PF-Ropivacaine + Triamcinolone (40 mg/mL) diluted to a final concentration of 4 mg of Triamcinolone/mL of Ropivacaine The unused portion of the solution was discarded in the proper designated containers.  Once the entire procedure was completed, the treated area was cleaned, making sure to leave some of the prepping solution back to take advantage of its long term bactericidal properties.  Illustration of the posterior view of the lumbar spine and the posterior neural structures. Laminae of L2 through S1 are labeled. DPRL5, dorsal primary ramus of L5; DPRS1, dorsal primary ramus of S1; DPR3, dorsal primary ramus of L3; FJ, facet (zygapophyseal) joint L3-L4; I, inferior articular process of L4; LB1, lateral branch of  dorsal primary ramus of L1; IAB, inferior articular branches from L3 medial branch (supplies L4-L5 facet joint); IBP, intermediate branch plexus; MB3, medial branch of dorsal primary ramus of L3; NR3, third lumbar nerve root; S, superior articular process of L5; SAB, superior articular branches from L4 (supplies L4-5 facet joint also); TP3, transverse process of L3.  Vitals:   09/18/17 1005 09/18/17 1015 09/18/17 1025 09/18/17 1035  BP: (!) 151/72 103/68 103/85 110/80  Pulse: (!) 59  Resp: 13 13 13 13   Temp:  (!) 97.2 F (36.2 C)  (!) 97.1 F (36.2 C)  SpO2: 97% 96% 100% 100%  Weight:      Height:        Start Time: 0938 hrs. End Time: 1004 hrs.  Imaging Guidance (Spinal):          Type of Imaging Technique: Fluoroscopy Guidance (Spinal) Indication(s): Assistance in needle guidance and placement for procedures requiring needle placement in or near specific anatomical locations not easily accessible without such assistance. Exposure Time: Please see nurses notes. Contrast: None used. Fluoroscopic Guidance: I was personally present during the use of fluoroscopy. "Tunnel Vision Technique" used to obtain the best possible view of the target area. Parallax error corrected before commencing the procedure. "Direction-depth-direction" technique used to introduce the needle under continuous pulsed fluoroscopy. Once target was reached, antero-posterior, oblique, and lateral fluoroscopic projection used confirm needle placement in all planes. Images permanently stored in EMR. Interpretation: No contrast injected. I personally interpreted the imaging intraoperatively. Adequate needle placement confirmed in multiple planes. Permanent images saved into the patient's record.  Antibiotic Prophylaxis:   Anti-infectives (From admission, onward)   None     Indication(s): None identified  Post-operative Assessment:  Post-procedure Vital Signs:  Pulse/HCG Rate: (!) 5968 Temp: (!) 97.1 F (36.2  C) Resp: 13 BP: 110/80 SpO2: 100 %  EBL: None  Complications: No immediate post-treatment complications observed by team, or reported by patient.  Note: The patient tolerated the entire procedure well. A repeat set of vitals were taken after the procedure and the patient was kept under observation following institutional policy, for this type of procedure. Post-procedural neurological assessment was performed, showing return to baseline, prior to discharge. The patient was provided with post-procedure discharge instructions, including a section on how to identify potential problems. Should any problems arise concerning this procedure, the patient was given instructions to immediately contact us, at any time, without hesitation. In any case, we plan to contact the patient by telephone for a follow-up status report regarding this interventional procedure.  Comments:  No additional relevant information.  Plan of Care    Imaging Orders     DG C-Arm 1-60 Min-No Report  Procedure Orders     Radiofrequency,Lumbar  Medications ordered for procedure: Meds ordered this encounter  Medications  . lidocaine (XYLOCAINE) 2 % (with pres) injection 400 mg  . midazolam (VERSED) 5 MG/5ML injection 1-2 mg    Make sure Flumazenil is available in the pyxis when using this medication. If oversedation occurs, administer 0.2 mg IV over 15 sec. If after 45 sec no response, administer 0.2 mg again over 1 min; may repeat at 1 min intervals; not to exceed 4 doses (1 mg)  . fentaNYL (SUBLIMAZE) injection 25-50 mcg    Make sure Narcan is available in the pyxis when using this medication. In the event of respiratory depression (RR< 8/min): Titrate NARCAN (naloxone) in increments of 0.1 to 0.2 mg IV at 2-3 minute intervals, until desired degree of reversal.  . lactated ringers infusion 1,000 mL  . ropivacaine (PF) 2 mg/mL (0.2%) (NAROPIN) injection 9 mL  . triamcinolone acetonide (KENALOG-40) injection 40 mg  .  oxyCODONE-acetaminophen (PERCOCET) 5-325 MG tablet    Sig: Take 1 tablet by mouth every 8 (eight) hours as needed for up to 7 days for severe pain.    Dispense:  21 tablet    Refill:  0    For acute post-operative pain. Not to be  refilled. To last 7 days.  Marland Kitchen oxyCODONE-acetaminophen (PERCOCET) 5-325 MG tablet    Sig: Take 1 tablet by mouth every 8 (eight) hours as needed for up to 7 days for severe pain.    Dispense:  21 tablet    Refill:  0    For acute post-operative pain. Not to be refilled. To last 7 days.   Medications administered: We administered lidocaine, midazolam, fentaNYL, lactated ringers, ropivacaine (PF) 2 mg/mL (0.2%), and triamcinolone acetonide.  See the medical record for exact dosing, route, and time of administration.  New Prescriptions   OXYCODONE-ACETAMINOPHEN (PERCOCET) 5-325 MG TABLET    Take 1 tablet by mouth every 8 (eight) hours as needed for up to 7 days for severe pain.   OXYCODONE-ACETAMINOPHEN (PERCOCET) 5-325 MG TABLET    Take 1 tablet by mouth every 8 (eight) hours as needed for up to 7 days for severe pain.   Disposition: Discharge home  Discharge Date & Time: 09/18/2017; 1038 hrs.   Physician-requested Follow-up: Return for Post-RFA eval (6 wks), w/ Dionisio David, NP.  Future Appointments  Date Time Provider [place]  10/30/2017  9:15 AM Vevelyn Francois, NP [facility] None  11/17/2017  8:30 AM Vevelyn Francois, NP [facility] None   Primary Care Physician: Randel Books, FNP Location: [facility] Note by: Gaspar Cola, MD Date: 09/18/2017; Time: 10:52 AM  Disclaimer:  Medicine is not an Chief Strategy Officer. The only guarantee in medicine is that nothing is guaranteed. It is important to note that the decision to proceed with this intervention was based on the information collected from the patient. The Data and conclusions were drawn from the patient's questionnaire, the interview, and the physical examination.  Because the information was provided in large part by the patient, it cannot be guaranteed that it has not been purposely or unconsciously manipulated. Every effort has been made to obtain as much relevant data as possible for this evaluation. It is important to note that the conclusions that lead to this procedure are derived in large part from the available data. Always take into account that the treatment will also be dependent on availability of resources and existing treatment guidelines, considered by other Pain Management Practitioners as being common knowledge and practice, at the time of the intervention. For Medico-Legal purposes, it is also important to point out that variation in procedural techniques and pharmacological choices are the acceptable norm. The indications, contraindications, technique, and results of the above procedure should only be interpreted and judged by a Board-Certified Interventional Pain Specialist with extensive familiarity and expertise in the same exact procedure and technique.

## 2017-09-19 ENCOUNTER — Telehealth: Payer: Self-pay

## 2017-09-19 NOTE — Telephone Encounter (Signed)
Post procedure phone call. Patient states she is doing good.  

## 2017-09-30 DIAGNOSIS — E7849 Other hyperlipidemia: Secondary | ICD-10-CM | POA: Diagnosis not present

## 2017-09-30 DIAGNOSIS — F411 Generalized anxiety disorder: Secondary | ICD-10-CM | POA: Diagnosis not present

## 2017-09-30 DIAGNOSIS — I10 Essential (primary) hypertension: Secondary | ICD-10-CM | POA: Diagnosis not present

## 2017-10-06 DIAGNOSIS — J302 Other seasonal allergic rhinitis: Secondary | ICD-10-CM | POA: Diagnosis not present

## 2017-10-06 DIAGNOSIS — R1312 Dysphagia, oropharyngeal phase: Secondary | ICD-10-CM | POA: Diagnosis not present

## 2017-10-21 ENCOUNTER — Other Ambulatory Visit: Payer: Self-pay | Admitting: Student

## 2017-10-21 DIAGNOSIS — R1310 Dysphagia, unspecified: Secondary | ICD-10-CM

## 2017-10-21 DIAGNOSIS — K219 Gastro-esophageal reflux disease without esophagitis: Secondary | ICD-10-CM | POA: Diagnosis not present

## 2017-10-23 DIAGNOSIS — Z23 Encounter for immunization: Secondary | ICD-10-CM | POA: Diagnosis not present

## 2017-10-27 ENCOUNTER — Ambulatory Visit
Admission: RE | Admit: 2017-10-27 | Discharge: 2017-10-27 | Disposition: A | Discharge status: Home / Self Care | Payer: Medicare Other | Source: Ambulatory Visit | Attending: Student | Admitting: Student

## 2017-10-27 DIAGNOSIS — R1310 Dysphagia, unspecified: Secondary | ICD-10-CM

## 2017-10-27 DIAGNOSIS — K222 Esophageal obstruction: Secondary | ICD-10-CM | POA: Insufficient documentation

## 2017-10-30 ENCOUNTER — Encounter: Payer: Self-pay | Admitting: Nurse Practitioner

## 2017-10-30 ENCOUNTER — Other Ambulatory Visit: Payer: Self-pay

## 2017-10-30 ENCOUNTER — Ambulatory Visit: Payer: Medicare Other | Attending: Nurse Practitioner | Admitting: Nurse Practitioner

## 2017-10-30 VITALS — BP 130/77 | HR 58 | Temp 98.1°F | Resp 18 | Ht 61.0 in | Wt 161.0 lb

## 2017-10-30 DIAGNOSIS — M545 Low back pain: Secondary | ICD-10-CM | POA: Diagnosis not present

## 2017-10-30 DIAGNOSIS — Z950 Presence of cardiac pacemaker: Secondary | ICD-10-CM | POA: Insufficient documentation

## 2017-10-30 DIAGNOSIS — Z9889 Other specified postprocedural states: Secondary | ICD-10-CM | POA: Diagnosis not present

## 2017-10-30 DIAGNOSIS — Z87891 Personal history of nicotine dependence: Secondary | ICD-10-CM | POA: Insufficient documentation

## 2017-10-30 DIAGNOSIS — Z79899 Other long term (current) drug therapy: Secondary | ICD-10-CM | POA: Insufficient documentation

## 2017-10-30 DIAGNOSIS — G894 Chronic pain syndrome: Secondary | ICD-10-CM | POA: Diagnosis not present

## 2017-10-30 DIAGNOSIS — Z9049 Acquired absence of other specified parts of digestive tract: Secondary | ICD-10-CM | POA: Insufficient documentation

## 2017-10-30 DIAGNOSIS — M47816 Spondylosis without myelopathy or radiculopathy, lumbar region: Secondary | ICD-10-CM

## 2017-10-30 DIAGNOSIS — Z7982 Long term (current) use of aspirin: Secondary | ICD-10-CM | POA: Diagnosis not present

## 2017-10-30 DIAGNOSIS — M48061 Spinal stenosis, lumbar region without neurogenic claudication: Secondary | ICD-10-CM | POA: Diagnosis not present

## 2017-10-30 DIAGNOSIS — I10 Essential (primary) hypertension: Secondary | ICD-10-CM | POA: Diagnosis not present

## 2017-10-30 DIAGNOSIS — K219 Gastro-esophageal reflux disease without esophagitis: Secondary | ICD-10-CM | POA: Insufficient documentation

## 2017-10-30 DIAGNOSIS — E785 Hyperlipidemia, unspecified: Secondary | ICD-10-CM | POA: Insufficient documentation

## 2017-10-30 DIAGNOSIS — Z79891 Long term (current) use of opiate analgesic: Secondary | ICD-10-CM | POA: Diagnosis not present

## 2017-10-30 DIAGNOSIS — M792 Neuralgia and neuritis, unspecified: Secondary | ICD-10-CM | POA: Diagnosis not present

## 2017-10-30 DIAGNOSIS — Z5181 Encounter for therapeutic drug level monitoring: Secondary | ICD-10-CM | POA: Insufficient documentation

## 2017-10-30 DIAGNOSIS — M797 Fibromyalgia: Secondary | ICD-10-CM

## 2017-10-30 MED ORDER — TIZANIDINE HCL 4 MG PO TABS: 4.0000 mg | ORAL_TABLET | Freq: Four times a day (QID) | ORAL | 2 refills | Status: AC | PRN

## 2017-10-30 MED ORDER — GABAPENTIN 300 MG PO CAPS: 300.0000 mg | ORAL_CAPSULE | Freq: Four times a day (QID) | ORAL | 2 refills | Status: DC

## 2017-10-30 MED ORDER — OXYCODONE HCL 5 MG PO TABS: 5.0000 mg | ORAL_TABLET | Freq: Four times a day (QID) | ORAL | 0 refills | Status: DC | PRN

## 2017-10-30 NOTE — Progress Notes (Signed)
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 every 6 (six) hours as needed for severe pain.    Dispense:  120 tablet    Refill:  0    Do not place this medication, or any other prescription from our practice, on "Automatic Refill". Patient may have prescription filled one day early if pharmacy is closed on scheduled refill date.    Order Specific Question:   Supervising Provider    Answer:   Milinda Pointer [phone]  . oxyCODONE (OXY IR/ROXICODONE) 5 MG immediate release tablet    Sig: Take 1 tablet (5 mg total) by mouth every 6 (six) hours as needed for severe pain.    Dispense:  120 tablet    Refill:  0    Do not place this medication, or any other prescription from our practice, on "Automatic Refill". Patient may have prescription filled one day early if pharmacy is closed on scheduled refill date.    Order Specific Question:   Supervising Provider    Answer:   Milinda Pointer [phone]  . oxyCODONE (OXY IR/ROXICODONE) 5 MG  immediate release tablet    Sig: Take 1 tablet (5 mg total) by mouth every 6 (six) hours as needed for severe pain.    Dispense:  120 tablet    Refill:  0    Do not place this medication, or any other prescription from our practice, on "Automatic Refill". Patient may have prescription filled one day early if pharmacy is closed on scheduled refill date.    Order Specific Question:   Supervising Provider    Answer:   Milinda Pointer [phone]  . tiZANidine (ZANAFLEX) 4 MG tablet    Sig: Take 1 tablet (4 mg total) by  mouth every 6 (six) hours as needed for muscle spasms.    Dispense:  120 tablet    Refill:  2    Order Specific Question:   Supervising Provider    Answer:   Milinda Pointer [phone]   New Prescriptions   No medications on file   Medications administered today: [name] had no medications administered during this visit. Lab-work, procedure(s), and/or referral(s): Orders Placed This Encounter  Procedures  . ToxASSURE Select 13 (MW), Urine   Imaging and/or referral(s): None  Interventional management options: Planned, scheduled, and/or pending: None at this time.   Considering: Diagnostic bilateral intra-articular hip injection. Palliativeleftlumbar facetRFA.(Last done on: 07/10/2017 [#2]) Palliativerightlumbar facetRFA.(Last done on: 06/06/2016 [#1]) Palliative bilateral lumbar facet block. Palliativeleft sacroiliac jointRFA.(Last done on: 07/10/2017 [#2]) Palliative L1-2 vs L4-5LESI. Palliative bilateral L4-5 TFESI. Palliative right-sided L1-2 LESI #2 + left sided L4 transforaminal ESI #2   Palliative PRN treatment(s): Diagnostic bilateral intra-articular hip injection. Palliativeleftlumbar facetRFA.(Last done on: 07/10/2017 [#2]) Palliativerightlumbar facetRFA.(Last done on: 06/06/2016 [#1]) Palliative bilateral lumbar facet block. Palliativeleft sacroiliac joint block. Palliativeleft sacroiliac  jointRFA.(Last done on: 07/10/2017 [#2]) Palliative L1-2 vs L4-5LESI. Palliative bilateral L4-5 TFESI.     Provider-requested follow-up: Return in 3 months (on 02/04/2018) for MedMgmt.  Future Appointments  Date Time Provider [place]  01/14/2018  8:45 AM Vevelyn Francois, NP [facility] None   Primary Care Physician: Randel Books, FNP Location: [facility] Outpatient Pain Management Facility Note by: Vevelyn Francois NP Date: 10/30/2017; Time: 3:31 PM  Pain Score Disclaimer: We use the NRS-11 scale. This is a self-reported, subjective measurement of pain severity with only modest accuracy. It is used primarily to identify changes within a particular patient. It must be understood that outpatient pain scales are significantly less accurate that those used for research, where they can be applied under ideal controlled circumstances with minimal exposure to variables. In reality, the score is likely to be a combination of pain intensity and pain affect, where pain affect describes the degree of emotional arousal or changes in action readiness caused by the sensory experience of pain. Factors such as social and work situation, setting, emotional state, anxiety levels, expectation, and prior pain experience may influence pain perception and show large inter-individual differences that may also be affected by time variables.  Patient instructions provided during this appointment: Patient Instructions   ____________________________________________________________________________________________  Medication Rules  Applies to: All patients receiving prescriptions (written or electronic).  Pharmacy of record: Pharmacy where electronic prescriptions will be sent. If written prescriptions are taken to a different pharmacy, please inform the nursing staff. The pharmacy listed in the electronic medical record should be the one where you would like electronic prescriptions to be  sent.  Prescription refills: Only during scheduled appointments. Applies to both, written and electronic prescriptions.  NOTE: The following applies primarily to controlled substances (Opioid* Pain Medications).   Patient's responsibilities: 1. Pain Pills: Bring all pain pills to every appointment (except for procedure appointments). 2. Pill Bottles: Bring pills in original pharmacy bottle. Always bring newest bottle. Bring bottle, even if empty. 3. Medication refills: You are responsible for knowing and keeping track of what medications you need refilled. The day before your appointment, write a list of all prescriptions that need to be refilled. Bring that list to your appointment and give it to the admitting nurse. Prescriptions will be written only during appointments. If you forget a medication, it will not be "Called in", "Faxed", or "electronically sent". You will  need to get another appointment to get these prescribed. 4. Prescription Accuracy: You are responsible for carefully inspecting your prescriptions before leaving our office. Have the discharge nurse carefully go over each prescription with you, before taking them home. Make sure that your name is accurately spelled, that your address is correct. Check the name and dose of your medication to make sure it is accurate. Check the number of pills, and the written instructions to make sure they are clear and accurate. Make sure that you are given enough medication to last until your next medication refill appointment. 5. Taking Medication: Take medication as prescribed. Never take more pills than instructed. Never take medication more frequently than prescribed. Taking less pills or less frequently is permitted and encouraged, when it comes to controlled substances (written prescriptions).  6. Inform other Doctors: Always inform, all of your healthcare providers, of all the medications you take. 7. Pain Medication from other Providers: You  are not allowed to accept any additional pain medication from any other Doctor or Healthcare provider. There are two exceptions to this rule. (see below) In the event that you require additional pain medication, you are responsible for notifying us, as stated below. 8. Medication Agreement: You are responsible for carefully reading and following our Medication Agreement. This must be signed before receiving any prescriptions from our practice. Safely store a copy of your signed Agreement. Violations to the Agreement will result in no further prescriptions. (Additional copies of our Medication Agreement are available upon request.) 9. Laws, Rules, & Regulations: All patients are expected to follow all Federal and [employer], Rules, [employer]. Ignorance of the Laws does not constitute a valid excuse. The use of any illegal substances is prohibited. 10. Adopted CDC guidelines & recommendations: Target dosing levels will be at or below 60 MME/day. Use of benzodiazepines** is not recommended.  Exceptions: There are only two exceptions to the rule of not receiving pain medications from other Healthcare Providers. 1. Exception #1 (Emergencies): In the event of an emergency (i.e.: accident requiring emergency care), you are allowed to receive additional pain medication. However, you are responsible for: As soon as you are able, call our office [phone], at any time of the day or night, and leave a message stating your name, the date and nature of the emergency, and the name and dose of the medication prescribed. In the event that your call is answered by a member of our staff, make sure to document and save the date, time, and the name of the person that took your information.  2. Exception #2 (Planned Surgery): In the event that you are scheduled by another doctor or dentist to have any type of surgery or procedure, you are allowed (for a period no longer than 30 days), to receive additional pain  medication, for the acute post-op pain. However, in this case, you are responsible for picking up a copy of our "Post-op Pain Management for Surgeons" handout, and giving it to your surgeon or dentist. This document is available at our office, and does not require an appointment to obtain it. Simply go to our office during business hours (Monday-Thursday from 8:00 AM to 4:00 PM) (Friday 8:00 AM to 12:00 Noon) or if you have a scheduled appointment with [place], prior to your surgery, and ask for it by name. In addition, you will need to provide [place] with your name, name of your surgeon, type of surgery, and date of procedure or surgery.  *Opioid medications  include: morphine, codeine, oxycodone, oxymorphone, hydrocodone, hydromorphone, meperidine, tramadol, tapentadol, buprenorphine, fentanyl, methadone. **Benzodiazepine medications include: diazepam (Valium), alprazolam (Xanax), clonazepam (Klonopine), lorazepam (Ativan), clorazepate (Tranxene), chlordiazepoxide (Librium), estazolam (Prosom), oxazepam (Serax), temazepam (Restoril), triazolam (Halcion) (Last updated: 03/27/2017) ____________________________________________________________________________________________   BMI Assessment: Estimated body mass index is 30.42 kg/m as calculated from the following:   Height as of this encounter: '5\' 1"'  (1.549 m).   Weight as of this encounter: 161 lb (73 kg).  BMI interpretation table: BMI level Category Range association with higher incidence of chronic pain  <18 kg/m2 Underweight   18.5-24.9 kg/m2 Ideal body weight   25-29.9 kg/m2 Overweight Increased incidence by 20%  30-34.9 kg/m2 Obese (Class I) Increased incidence by 68%  35-39.9 kg/m2 Severe obesity (Class II) Increased incidence by 136%  >40 kg/m2 Extreme obesity (Class III) Increased incidence by 254%   Patient's current BMI Ideal Body weight  Body mass index is 30.42 kg/m. Ideal body weight: 47.8 kg (105 lb 6.1 oz) Adjusted ideal body weight:  57.9 kg (127 lb 10 oz)   BMI Readings from Last 4 Encounters:  10/30/17 30.42 kg/m  09/18/17 29.85 kg/m  08/20/17 30.23 kg/m  07/23/17 30.23 kg/m   Wt Readings from Last 4 Encounters:  10/30/17 161 lb (73 kg)  09/18/17 158 lb (71.7 kg)  08/20/17 160 lb (72.6 kg)  07/23/17 160 lb (72.6 kg)   All scripts e-scribed.

## 2017-10-30 NOTE — Patient Instructions (Addendum)
____________________________________________________________________________________________  Medication Rules  Applies to: All patients receiving prescriptions (written or electronic).  Pharmacy of record: Pharmacy where electronic prescriptions will be sent. If written prescriptions are taken to a different pharmacy, please inform the nursing staff. The pharmacy listed in the electronic medical record should be the one where you would like electronic prescriptions to be sent.  Prescription refills: Only during scheduled appointments. Applies to both, written and electronic prescriptions.  NOTE: The following applies primarily to controlled substances (Opioid* Pain Medications).   Patient's responsibilities: 1. Pain Pills: Bring all pain pills to every appointment (except for procedure appointments). 2. Pill Bottles: Bring pills in original pharmacy bottle. Always bring newest bottle. Bring bottle, even if empty. 3. Medication refills: You are responsible for knowing and keeping track of what medications you need refilled. The day before your appointment, write a list of all prescriptions that need to be refilled. Bring that list to your appointment and give it to the admitting nurse. Prescriptions will be written only during appointments. If you forget a medication, it will not be "Called in", "Faxed", or "electronically sent". You will need to get another appointment to get these prescribed. 4. Prescription Accuracy: You are responsible for carefully inspecting your prescriptions before leaving our office. Have the discharge nurse carefully go over each prescription with you, before taking them home. Make sure that your name is accurately spelled, that your address is correct. Check the name and dose of your medication to make sure it is accurate. Check the number of pills, and the written instructions to make sure they are clear and accurate. Make sure that you are given enough medication to last  until your next medication refill appointment. 5. Taking Medication: Take medication as prescribed. Never take more pills than instructed. Never take medication more frequently than prescribed. Taking less pills or less frequently is permitted and encouraged, when it comes to controlled substances (written prescriptions).  6. Inform other Doctors: Always inform, all of your healthcare providers, of all the medications you take. 7. Pain Medication from other Providers: You are not allowed to accept any additional pain medication from any other Doctor or Healthcare provider. There are two exceptions to this rule. (see below) In the event that you require additional pain medication, you are responsible for notifying us, as stated below. 8. Medication Agreement: You are responsible for carefully reading and following our Medication Agreement. This must be signed before receiving any prescriptions from our practice. Safely store a copy of your signed Agreement. Violations to the Agreement will result in no further prescriptions. (Additional copies of our Medication Agreement are available upon request.) 9. Laws, Rules, & Regulations: All patients are expected to follow all Federal and State Laws, Statutes, Rules, & Regulations. Ignorance of the Laws does not constitute a valid excuse. The use of any illegal substances is prohibited. 10. Adopted CDC guidelines & recommendations: Target dosing levels will be at or below 60 MME/day. Use of benzodiazepines** is not recommended.  Exceptions: There are only two exceptions to the rule of not receiving pain medications from other Healthcare Providers. 1. Exception #1 (Emergencies): In the event of an emergency (i.e.: accident requiring emergency care), you are allowed to receive additional pain medication. However, you are responsible for: As soon as you are able, call our office [phone], at any time of the day or night, and leave a message stating your name, the  date and nature of the emergency, and the name and dose of the medication   prescribed. In the event that your call is answered by a member of our staff, make sure to document and save the date, time, and the name of the person that took your information.  2. Exception #2 (Planned Surgery): In the event that you are scheduled by another doctor or dentist to have any type of surgery or procedure, you are allowed (for a period no longer than 30 days), to receive additional pain medication, for the acute post-op pain. However, in this case, you are responsible for picking up a copy of our "Post-op Pain Management for Surgeons" handout, and giving it to your surgeon or dentist. This document is available at our office, and does not require an appointment to obtain it. Simply go to our office during business hours (Monday-Thursday from 8:00 AM to 4:00 PM) (Friday 8:00 AM to 12:00 Noon) or if you have a scheduled appointment with [place], prior to your surgery, and ask for it by name. In addition, you will need to provide [place] with your name, name of your surgeon, type of surgery, and date of procedure or surgery.  *Opioid medications include: morphine, codeine, oxycodone, oxymorphone, hydrocodone, hydromorphone, meperidine, tramadol, tapentadol, buprenorphine, fentanyl, methadone. **Benzodiazepine medications include: diazepam (Valium), alprazolam (Xanax), clonazepam (Klonopine), lorazepam (Ativan), clorazepate (Tranxene), chlordiazepoxide (Librium), estazolam (Prosom), oxazepam (Serax), temazepam (Restoril), triazolam (Halcion) (Last updated: 03/27/2017) ____________________________________________________________________________________________   BMI Assessment: Estimated body mass index is 30.42 kg/m as calculated from the following:   Height as of this encounter: 5\' 1"  (1.549 m).   Weight as of this encounter: 161 lb (73 kg).  BMI interpretation table: BMI level Category Range association with higher incidence  of chronic pain  <18 kg/m2 Underweight   18.5-24.9 kg/m2 Ideal body weight   25-29.9 kg/m2 Overweight Increased incidence by 20%  30-34.9 kg/m2 Obese (Class I) Increased incidence by 68%  35-39.9 kg/m2 Severe obesity (Class II) Increased incidence by 136%  >40 kg/m2 Extreme obesity (Class III) Increased incidence by 254%   Patient's current BMI Ideal Body weight  Body mass index is 30.42 kg/m. Ideal body weight: 47.8 kg (105 lb 6.1 oz) Adjusted ideal body weight: 57.9 kg (127 lb 10 oz)   BMI Readings from Last 4 Encounters:  10/30/17 30.42 kg/m  09/18/17 29.85 kg/m  08/20/17 30.23 kg/m  07/23/17 30.23 kg/m   Wt Readings from Last 4 Encounters:  10/30/17 161 lb (73 kg)  09/18/17 158 lb (71.7 kg)  08/20/17 160 lb (72.6 kg)  07/23/17 160 lb (72.6 kg)   All scripts e-scribed.

## 2017-10-30 NOTE — Progress Notes (Signed)
Nursing Pain Medication Assessment:  Safety precautions to be maintained throughout the outpatient stay will include: orient to surroundings, keep bed in low position, maintain call bell within reach at all times, provide assistance with transfer out of bed and ambulation.  Medication Inspection Compliance: Pill count conducted under aseptic conditions, in front of the patient. Neither the pills nor the bottle was removed from the patient's sight at any time. Once count was completed pills were immediately returned to the patient in their original bottle.  Medication: Oxycodone IR Pill/Patch Count: 118 of 120 pills remain Pill/Patch Appearance: Markings consistent with prescribed medication Bottle Appearance: Standard pharmacy container. Clearly labeled. Filled Date: 10 / 01 / 2019 Last Medication intake:  Today

## 2017-11-06 ENCOUNTER — Encounter: Payer: Self-pay | Admitting: *Deleted

## 2017-11-06 LAB — TOXASSURE SELECT 13 (MW), URINE

## 2017-11-07 ENCOUNTER — Encounter: Payer: Self-pay | Admitting: Anesthesiology

## 2017-11-07 ENCOUNTER — Encounter
Admission: RE | Disposition: A | Discharge status: Home / Self Care | Payer: Self-pay | Source: Ambulatory Visit | Attending: Unknown Physician Specialty

## 2017-11-07 ENCOUNTER — Ambulatory Visit: Payer: Medicare Other | Admitting: Anesthesiology

## 2017-11-07 ENCOUNTER — Ambulatory Visit
Admission: RE | Admit: 2017-11-07 | Discharge: 2017-11-07 | Disposition: A | Discharge status: Home / Self Care | Payer: Medicare Other | Source: Ambulatory Visit | Attending: Unknown Physician Specialty | Admitting: Unknown Physician Specialty

## 2017-11-07 DIAGNOSIS — K222 Esophageal obstruction: Secondary | ICD-10-CM | POA: Insufficient documentation

## 2017-11-07 DIAGNOSIS — G2581 Restless legs syndrome: Secondary | ICD-10-CM | POA: Diagnosis not present

## 2017-11-07 DIAGNOSIS — Z87891 Personal history of nicotine dependence: Secondary | ICD-10-CM | POA: Diagnosis not present

## 2017-11-07 DIAGNOSIS — E785 Hyperlipidemia, unspecified: Secondary | ICD-10-CM | POA: Diagnosis not present

## 2017-11-07 DIAGNOSIS — M109 Gout, unspecified: Secondary | ICD-10-CM | POA: Insufficient documentation

## 2017-11-07 DIAGNOSIS — K219 Gastro-esophageal reflux disease without esophagitis: Secondary | ICD-10-CM | POA: Diagnosis not present

## 2017-11-07 DIAGNOSIS — K2900 Acute gastritis without bleeding: Secondary | ICD-10-CM | POA: Diagnosis not present

## 2017-11-07 DIAGNOSIS — F419 Anxiety disorder, unspecified: Secondary | ICD-10-CM | POA: Diagnosis not present

## 2017-11-07 DIAGNOSIS — Z79899 Other long term (current) drug therapy: Secondary | ICD-10-CM | POA: Diagnosis not present

## 2017-11-07 DIAGNOSIS — I10 Essential (primary) hypertension: Secondary | ICD-10-CM | POA: Diagnosis not present

## 2017-11-07 DIAGNOSIS — F329 Major depressive disorder, single episode, unspecified: Secondary | ICD-10-CM | POA: Insufficient documentation

## 2017-11-07 DIAGNOSIS — K259 Gastric ulcer, unspecified as acute or chronic, without hemorrhage or perforation: Secondary | ICD-10-CM | POA: Diagnosis not present

## 2017-11-07 DIAGNOSIS — K2950 Unspecified chronic gastritis without bleeding: Secondary | ICD-10-CM | POA: Diagnosis not present

## 2017-11-07 DIAGNOSIS — R1310 Dysphagia, unspecified: Secondary | ICD-10-CM | POA: Diagnosis not present

## 2017-11-07 DIAGNOSIS — G4700 Insomnia, unspecified: Secondary | ICD-10-CM | POA: Diagnosis not present

## 2017-11-07 DIAGNOSIS — J449 Chronic obstructive pulmonary disease, unspecified: Secondary | ICD-10-CM | POA: Diagnosis not present

## 2017-11-07 DIAGNOSIS — J45909 Unspecified asthma, uncomplicated: Secondary | ICD-10-CM | POA: Diagnosis not present

## 2017-11-07 DIAGNOSIS — F418 Other specified anxiety disorders: Secondary | ICD-10-CM | POA: Diagnosis not present

## 2017-11-07 DIAGNOSIS — K2970 Gastritis, unspecified, without bleeding: Secondary | ICD-10-CM | POA: Diagnosis not present

## 2017-11-07 HISTORY — DX: Postmenopausal atrophic vaginitis: N95.2

## 2017-11-07 HISTORY — DX: Personal history of other diseases of the respiratory system: Z87.09

## 2017-11-07 HISTORY — DX: Dry eye syndrome of bilateral lacrimal glands: H04.123

## 2017-11-07 HISTORY — PX: ESOPHAGOGASTRODUODENOSCOPY (EGD) WITH PROPOFOL: SHX5813

## 2017-11-07 HISTORY — DX: Uterovaginal prolapse, unspecified: N81.4

## 2017-11-07 HISTORY — DX: Insomnia, unspecified: G47.00

## 2017-11-07 HISTORY — DX: Diffuse cystic mastopathy of unspecified breast: N60.19

## 2017-11-07 HISTORY — DX: Allergic rhinitis, unspecified: J30.9

## 2017-11-07 SURGERY — ESOPHAGOGASTRODUODENOSCOPY (EGD) WITH PROPOFOL
Anesthesia: General

## 2017-11-07 MED ORDER — SODIUM CHLORIDE 0.9 % IV SOLN: INTRAVENOUS | Status: DC

## 2017-11-07 MED ORDER — SODIUM CHLORIDE 0.9 % IV SOLN
INTRAVENOUS | Status: DC
  Administered 2017-11-07: 09:00:00 via INTRAVENOUS

## 2017-11-07 MED ORDER — PROPOFOL 10 MG/ML IV BOLUS
INTRAVENOUS | Status: DC | PRN
  Administered 2017-11-07: 50 mg via INTRAVENOUS

## 2017-11-07 MED ORDER — PROPOFOL 500 MG/50ML IV EMUL
INTRAVENOUS | Status: DC | PRN
  Administered 2017-11-07: 120 ug/kg/min via INTRAVENOUS

## 2017-11-07 NOTE — Anesthesia Postprocedure Evaluation (Signed)
Anesthesia Post Note  Patient: [name]  Procedure(s) Performed: ESOPHAGOGASTRODUODENOSCOPY (EGD) WITH PROPOFOL (N/A )  Patient location during evaluation: Endoscopy Anesthesia Type: General Level of consciousness: awake and alert Pain management: pain level controlled Vital Signs Assessment: post-procedure vital signs reviewed and stable Respiratory status: spontaneous breathing, nonlabored ventilation, respiratory function stable and patient connected to nasal cannula oxygen Cardiovascular status: blood pressure returned to baseline and stable Postop Assessment: no apparent nausea or vomiting Anesthetic complications: no     Last Vitals:  Vitals:   11/07/17 0915 11/07/17 0953  BP: (!) 168/90 123/62  Pulse: (!) 59 62  Resp: 20 15  Temp: (!) 35.7 C (!) 36.2 C  SpO2: 100%     Last Pain:  Vitals:   11/07/17 0953  TempSrc: Temporal  PainSc:                  [name] S

## 2017-11-07 NOTE — Op Note (Signed)
[facility] Patient Name: [name] Procedure Date: 11/07/2017 9:33 AM MRN: [id number] Account #: [id number] Date of Birth: Nov 29, 1945 Admit Type: Outpatient Age: 72 Room: [facility] ENDO ROOM 1 Gender: Female Note Status: Finalized Procedure:            Upper GI endoscopy Indications:          Dysphagia Providers:            Manya Silvas, MD Referring MD:         Delray Alt. Willett (Referring MD) Medicines:            Propofol per Anesthesia Complications:        No immediate complications. Procedure:            Pre-Anesthesia Assessment:                       - After reviewing the risks and benefits, the patient                        was deemed in satisfactory condition to undergo the                        procedure.                       After obtaining informed consent, the endoscope was                        passed under direct vision. Throughout the procedure,                        the patient's blood pressure, pulse, and oxygen                        saturations were monitored continuously. The Endoscope                        was introduced through the mouth, and advanced to the                        second part of duodenum. The upper GI endoscopy was                        accomplished without difficulty. The patient tolerated                        the procedure well. Findings:      A mild Schatzki ring was found at the gastroesophageal junction. At the       end of the procedure a guidewire was placed and the scope was withdrawn.       Dilation was performed with a Savary dilator with mild resistance at 17       mm.      Patchy moderate inflammation characterized by erosions, erythema and       granularity was found in the gastric antrum. Biopsies were taken with a       cold forceps for histology. Biopsies were taken with a cold forceps for       Helicobacter pylori testing.      The examined duodenum was  normal. Impression:           -  Mild Schatzki ring. Dilated.                       - Gastritis. Biopsied.                       - Normal examined duodenum. Recommendation:       - Await pathology results. Manya Silvas, MD 11/07/2017 9:51:47 AM This report has been signed electronically. Number of Addenda: 0 Note Initiated On: 11/07/2017 9:33 AM      [facility]

## 2017-11-07 NOTE — Anesthesia Preprocedure Evaluation (Signed)
Anesthesia Evaluation  Patient identified by MRN, date of birth, ID band Patient awake    Reviewed: Allergy & Precautions, NPO status , Patient's Chart, lab work & pertinent test results, reviewed documented beta blocker date and time   Airway Mallampati: III  TM Distance: >3 FB     Dental  (+) Chipped   Pulmonary asthma , COPD, former smoker,           Cardiovascular hypertension, Pt. on medications and Pt. on home beta blockers      Neuro/Psych  Headaches, PSYCHIATRIC DISORDERS Anxiety Depression  Neuromuscular disease    GI/Hepatic hiatal hernia, GERD  ,  Endo/Other    Renal/GU      Musculoskeletal  (+) Arthritis , Fibromyalgia -  Abdominal   Peds  Hematology   Anesthesia Other Findings Overbite.spinal stim has been cut off.  Reproductive/Obstetrics                             Anesthesia Physical Anesthesia Plan  ASA: III  Anesthesia Plan: General   Post-op Pain Management:    Induction: Intravenous  PONV Risk Score and Plan:   Airway Management Planned:   Additional Equipment:   Intra-op Plan:   Post-operative Plan:   Informed Consent: I have reviewed the patients History and Physical, chart, labs and discussed the procedure including the risks, benefits and alternatives for the proposed anesthesia with the patient or authorized representative who has indicated his/her understanding and acceptance.     Plan Discussed with: CRNA  Anesthesia Plan Comments:         Anesthesia Quick Evaluation

## 2017-11-07 NOTE — Transfer of Care (Signed)
Immediate Anesthesia Transfer of Care Note  Patient: [name]  Procedure(s) Performed: ESOPHAGOGASTRODUODENOSCOPY (EGD) WITH PROPOFOL (N/A )  Patient Location: PACU and Endoscopy Unit  Anesthesia Type:General  Level of Consciousness: awake, alert  and oriented  Airway & Oxygen Therapy: Patient Spontanous Breathing  Post-op Assessment: Report given to RN  Post vital signs: Reviewed and stable  Last Vitals:  Vitals Value Taken Time  BP 123/62 11/07/2017  9:53 AM  Temp 36.2 C 11/07/2017  9:53 AM  Pulse 62 11/07/2017  9:54 AM  Resp 16 11/07/2017  9:54 AM  SpO2 97 % 11/07/2017  9:54 AM  Vitals shown include unvalidated device data.  Last Pain:  Vitals:   11/07/17 0953  TempSrc: Temporal  PainSc:          Complications: No apparent anesthesia complications
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
## 2017-11-07 NOTE — Anesthesia Post-op Follow-up Note (Signed)
Anesthesia QCDR form completed.        

## 2017-11-10 LAB — SURGICAL PATHOLOGY

## 2017-11-17 ENCOUNTER — Ambulatory Visit: Payer: Medicare Other | Admitting: Nurse Practitioner

## 2017-11-21 DIAGNOSIS — J209 Acute bronchitis, unspecified: Secondary | ICD-10-CM | POA: Diagnosis not present

## 2017-11-21 DIAGNOSIS — J9801 Acute bronchospasm: Secondary | ICD-10-CM | POA: Diagnosis not present

## 2017-11-27 ENCOUNTER — Ambulatory Visit: Payer: Medicare Other | Admitting: Pain Medicine

## 2017-11-27 DIAGNOSIS — E7849 Other hyperlipidemia: Secondary | ICD-10-CM | POA: Diagnosis not present

## 2017-11-27 DIAGNOSIS — I10 Essential (primary) hypertension: Secondary | ICD-10-CM | POA: Diagnosis not present

## 2017-11-27 DIAGNOSIS — M810 Age-related osteoporosis without current pathological fracture: Secondary | ICD-10-CM | POA: Diagnosis not present

## 2017-11-27 DIAGNOSIS — M150 Primary generalized (osteo)arthritis: Secondary | ICD-10-CM | POA: Diagnosis not present

## 2017-11-28 DIAGNOSIS — J9801 Acute bronchospasm: Secondary | ICD-10-CM | POA: Diagnosis not present

## 2017-11-28 DIAGNOSIS — J209 Acute bronchitis, unspecified: Secondary | ICD-10-CM | POA: Diagnosis not present

## 2017-12-04 ENCOUNTER — Ambulatory Visit: Payer: Medicare Other | Admitting: Pain Medicine

## 2017-12-04 DIAGNOSIS — M438X4 Other specified deforming dorsopathies, thoracic region: Secondary | ICD-10-CM | POA: Diagnosis not present

## 2017-12-04 DIAGNOSIS — M47814 Spondylosis without myelopathy or radiculopathy, thoracic region: Secondary | ICD-10-CM | POA: Diagnosis not present

## 2017-12-04 DIAGNOSIS — R05 Cough: Secondary | ICD-10-CM | POA: Diagnosis not present

## 2017-12-08 ENCOUNTER — Telehealth: Payer: Self-pay

## 2017-12-08 NOTE — Telephone Encounter (Signed)
Pt called and stated that she has been sick and about a week ago she got a shot similar to prednisone and wanted to know if she could still come in for her procedure. Please call her on her cell phone [phone]

## 2017-12-08 NOTE — Telephone Encounter (Signed)
Spoke with patient, she states that she is no longer sick. I told her that it is ok to get procedure re: recent Prednisone injection.

## 2017-12-11 ENCOUNTER — Ambulatory Visit (HOSPITAL_BASED_OUTPATIENT_CLINIC_OR_DEPARTMENT_OTHER): Payer: Medicare Other | Admitting: Pain Medicine

## 2017-12-11 ENCOUNTER — Ambulatory Visit
Admission: RE | Admit: 2017-12-11 | Discharge: 2017-12-11 | Disposition: A | Discharge status: Home / Self Care | Payer: Medicare Other | Source: Ambulatory Visit | Attending: Pain Medicine | Admitting: Pain Medicine

## 2017-12-11 ENCOUNTER — Encounter: Payer: Self-pay | Admitting: Pain Medicine

## 2017-12-11 ENCOUNTER — Other Ambulatory Visit: Payer: Self-pay

## 2017-12-11 VITALS — BP 180/75 | HR 65 | Temp 97.0°F | Resp 16 | Ht 61.0 in | Wt 161.0 lb

## 2017-12-11 DIAGNOSIS — G894 Chronic pain syndrome: Secondary | ICD-10-CM | POA: Diagnosis not present

## 2017-12-11 DIAGNOSIS — M25551 Pain in right hip: Secondary | ICD-10-CM | POA: Insufficient documentation

## 2017-12-11 DIAGNOSIS — M25552 Pain in left hip: Secondary | ICD-10-CM

## 2017-12-11 DIAGNOSIS — M549 Dorsalgia, unspecified: Secondary | ICD-10-CM | POA: Diagnosis present

## 2017-12-11 DIAGNOSIS — M5136 Other intervertebral disc degeneration, lumbar region: Secondary | ICD-10-CM

## 2017-12-11 DIAGNOSIS — M1611 Unilateral primary osteoarthritis, right hip: Secondary | ICD-10-CM | POA: Diagnosis not present

## 2017-12-11 DIAGNOSIS — G8929 Other chronic pain: Secondary | ICD-10-CM

## 2017-12-11 MED ORDER — LACTATED RINGERS IV SOLN
1000.0000 mL | Freq: Once | INTRAVENOUS | Status: AC
  Administered 2017-12-11: 1000 mL via INTRAVENOUS

## 2017-12-11 MED ORDER — METHYLPREDNISOLONE ACETATE 80 MG/ML IJ SUSP
80.0000 mg | Freq: Once | INTRAMUSCULAR | Status: AC
  Administered 2017-12-11: 80 mg via INTRA_ARTICULAR
  Filled 2017-12-11: qty 1

## 2017-12-11 MED ORDER — IOPAMIDOL (ISOVUE-M 200) INJECTION 41%
10.0000 mL | Freq: Once | INTRAMUSCULAR | Status: AC
  Administered 2017-12-11: 10 mL via INTRA_ARTICULAR
  Filled 2017-12-11: qty 10

## 2017-12-11 MED ORDER — LIDOCAINE HCL 2 % IJ SOLN
20.0000 mL | Freq: Once | INTRAMUSCULAR | Status: AC
  Administered 2017-12-11: 400 mg
  Filled 2017-12-11: qty 40

## 2017-12-11 MED ORDER — ROPIVACAINE HCL 2 MG/ML IJ SOLN
4.0000 mL | Freq: Once | INTRAMUSCULAR | Status: AC
  Administered 2017-12-11: 4 mL via INTRA_ARTICULAR
  Filled 2017-12-11: qty 10

## 2017-12-11 MED ORDER — FENTANYL CITRATE (PF) 100 MCG/2ML IJ SOLN
25.0000 ug | INTRAMUSCULAR | Status: DC | PRN
  Administered 2017-12-11: 100 ug via INTRAVENOUS
  Filled 2017-12-11: qty 2

## 2017-12-11 MED ORDER — MIDAZOLAM HCL 5 MG/5ML IJ SOLN
1.0000 mg | INTRAMUSCULAR | Status: DC | PRN
  Administered 2017-12-11: 3 mg via INTRAVENOUS
  Filled 2017-12-11: qty 5

## 2017-12-11 NOTE — Progress Notes (Signed)
Safety precautions to be maintained throughout the outpatient stay will include: orient to surroundings, keep bed in low position, maintain call bell within reach at all times, provide assistance with transfer out of bed and ambulation.  

## 2017-12-11 NOTE — Progress Notes (Signed)
Patient's Name: [name]  MRN: [id number]  Referring Provider: Randel Books, FNP  DOB: 1945/12/28  PCP: Randel Books, FNP  DOS: 12/11/2017  Note by: Gaspar Cola, MD  Service setting: Ambulatory outpatient  Specialty: Interventional Pain Management  Patient type: Established  Location: [facility] (AMB) Pain Management Facility  Visit type: Interventional Procedure   Primary Reason for Visit: Interventional Pain Management Treatment. CC: Back Pain  Procedure:          Anesthesia, Analgesia, Anxiolysis:  Type: Intra-Articular Hip Injection          Primary Purpose: Diagnostic Region: Posterolateral hip joint area. Level: Lower pelvic and hip joint level. Target Area: Superior aspect of the hip joint cavity, going thru the superior portion of the capsular ligament. Approach: Posterolateral approach. Laterality: Right-Sided  Type: Moderate (Conscious) Sedation combined with Local Anesthesia Indication(s): Analgesia and Anxiety Route: Intravenous (IV) IV Access: Secured Sedation: Meaningful verbal contact was maintained at all times during the procedure  Local Anesthetic: Lidocaine 1-2%  Position: Lateral Decubitus with bad side up Prepped Area: Entire Posterolateral hip area. Prepping solution: ChloraPrep (2% chlorhexidine gluconate and 70% isopropyl alcohol)   Indications: 1. Osteoarthritis of hip (Right)   2. Chronic hip pain (Right)   3. Chronic pain of both hips    Pain Score: Pre-procedure: 5 /10 Post-procedure: 0-No pain/10  Pre-op Assessment:  [name] is a 72 y.o. (year old), female patient, seen today for interventional treatment. She  has a past surgical history that includes Vaginal hysterectomy (1975); Incontinence surgery (2001); Oophorectomy (1978); Elbow Arthroplasty (1990); Cholecystectomy (1989); Rectocele repair (2009); back implant (2015); Mastectomy partial / lumpectomy (Bilateral, 1978 ); Augmentation mammaplasty (Bilateral, 1978);  Colonoscopy with propofol (N/A, 01/29/2017); Spinal cord stimulator implant; and Esophagogastroduodenoscopy (egd) with propofol (N/A, 11/07/2017). [name] has a current medication list which includes the following prescription(s): albuterol, alendronate, alendronate, alprazolam, aspirin ec, atenolol, calcium, cetirizine, cholecalciferol, cinnamon, citalopram, conjugated estrogens, fenofibrate, fish oil-flax oil-borage oil, fluticasone-salmeterol, furosemide, gabapentin, meclizine, melatonin, vitamin k2, montelukast, omega 3-6-9 complex, oxycodone, oxycodone, ropinirole, tizanidine, zinc gluconate, and zolpidem, and the following Facility-Administered Medications: fentanyl and midazolam. Her primarily concern today is the Back Pain  Initial Vital Signs:  Pulse/HCG Rate: 65ECG Heart Rate: 62 Temp: 98 F (36.7 C) Resp: 16 BP: (!) 147/83 SpO2: 100 %  BMI: Estimated body mass index is 30.42 kg/m as calculated from the following:   Height as of this encounter: 5\' 1"  (1.549 m).   Weight as of this encounter: 161 lb (73 kg).  Risk Assessment: Allergies: Reviewed. She is allergic to aspirin; morphine; vicodin [hydrocodone-acetaminophen]; niacin and related; and penicillins.  Allergy Precautions: None required Coagulopathies: Reviewed. None identified.  Blood-thinner therapy: None at this time Active Infection(s): Reviewed. None identified. [name] is afebrile  Site Confirmation: [name] was asked to confirm the procedure and laterality before marking the site Procedure checklist: Completed Consent: Before the procedure and under the influence of no sedative(s), amnesic(s), or anxiolytics, the patient was informed of the treatment options, risks and possible complications. To fulfill our ethical and legal obligations, as recommended by the American Medical Association's Code of Ethics, I have informed the patient of my clinical impression; the nature and purpose of the treatment or  procedure; the risks, benefits, and possible complications of the intervention; the alternatives, including doing nothing; the risk(s) and benefit(s) of the alternative treatment(s) or procedure(s); and the risk(s) and benefit(s) of doing nothing. The patient was provided information about the general risks  and possible complications associated with the procedure. These may include, but are not limited to: failure to achieve desired goals, infection, bleeding, organ or nerve damage, allergic reactions, paralysis, and death. In addition, the patient was informed of those risks and complications associated to the procedure, such as failure to decrease pain; infection; bleeding; organ or nerve damage with subsequent damage to sensory, motor, and/or autonomic systems, resulting in permanent pain, numbness, and/or weakness of one or several areas of the body; allergic reactions; (i.e.: anaphylactic reaction); and/or death. Furthermore, the patient was informed of those risks and complications associated with the medications. These include, but are not limited to: allergic reactions (i.e.: anaphylactic or anaphylactoid reaction(s)); adrenal axis suppression; blood sugar elevation that in diabetics may result in ketoacidosis or comma; water retention that in patients with history of congestive heart failure may result in shortness of breath, pulmonary edema, and decompensation with resultant heart failure; weight gain; swelling or edema; medication-induced neural toxicity; particulate matter embolism and blood vessel occlusion with resultant organ, and/or nervous system infarction; and/or aseptic necrosis of one or more joints. Finally, the patient was informed that Medicine is not an exact science; therefore, there is also the possibility of unforeseen or unpredictable risks and/or possible complications that may result in a catastrophic outcome. The patient indicated having understood very clearly. We have given the  patient no guarantees and we have made no promises. Enough time was given to the patient to ask questions, all of which were answered to the patient's satisfaction. [name] has indicated that she wanted to continue with the procedure. Attestation: I, the ordering provider, attest that I have discussed with the patient the benefits, risks, side-effects, alternatives, likelihood of achieving goals, and potential problems during recovery for the procedure that I have provided informed consent. Date  Time: 12/11/2017  7:55 AM  Pre-Procedure Preparation:  Monitoring: As per clinic protocol. Respiration, ETCO2, SpO2, BP, heart rate and rhythm monitor placed and checked for adequate function Safety Precautions: Patient was assessed for positional comfort and pressure points before starting the procedure. Time-out: I initiated and conducted the "Time-out" before starting the procedure, as per protocol. The patient was asked to participate by confirming the accuracy of the "Time Out" information. Verification of the correct person, site, and procedure were performed and confirmed by me, the nursing staff, and the patient. "Time-out" conducted as per Joint Commission's Universal Protocol (UP.01.01.01). Time: 0902  Description of Procedure:          Safety Precautions: Aspiration looking for blood return was conducted prior to all injections. At no point did we inject any substances, as a needle was being advanced. No attempts were made at seeking any paresthesias. Safe injection practices and needle disposal techniques used. Medications properly checked for expiration dates. SDV (single dose vial) medications used. Description of the Procedure: Protocol guidelines were followed. The patient was placed in position over the fluoroscopy table. The target area was identified and the area prepped in the usual manner. Skin & deeper tissues infiltrated with local anesthetic. Appropriate amount of time allowed to  pass for local anesthetics to take effect. The procedure needles were then advanced to the target area. Proper needle placement secured. Negative aspiration confirmed. Solution injected in intermittent fashion, asking for systemic symptoms every 0.5cc of injectate. The needles were then removed and the area cleansed, making sure to leave some of the prepping solution back to take advantage of its long term bactericidal properties. Vitals:   12/11/17 0909 12/11/17 0919  12/11/17 0929 12/11/17 0938  BP: (!) 142/67 (!) 145/68 (!) 190/74 (!) 180/75  Pulse:      Resp: 12 14 15 16   Temp:  (!) 96.4 F (35.8 C)  (!) 97 F (36.1 C)  TempSrc:      SpO2: 100% 100% 100% 100%  Weight:      Height:        Start Time: 0902 hrs. End Time: 0909 hrs. Materials:  Needle(s) Type: Spinal Needle Gauge: 22G Length: 5.0-in Medication(s): Please see orders for medications and dosing details.  Imaging Guidance (Non-Spinal):          Type of Imaging Technique: Fluoroscopy Guidance (Non-Spinal) Indication(s): Assistance in needle guidance and placement for procedures requiring needle placement in or near specific anatomical locations not easily accessible without such assistance. Exposure Time: Please see nurses notes. Contrast: Before injecting any contrast, we confirmed that the patient did not have an allergy to iodine, shellfish, or radiological contrast. Once satisfactory needle placement was completed at the desired level, radiological contrast was injected. Contrast injected under live fluoroscopy. No contrast complications. See chart for type and volume of contrast used. Fluoroscopic Guidance: I was personally present during the use of fluoroscopy. "Tunnel Vision Technique" used to obtain the best possible view of the target area. Parallax error corrected before commencing the procedure. "Direction-depth-direction" technique used to introduce the needle under continuous pulsed fluoroscopy. Once target was  reached, antero-posterior, oblique, and lateral fluoroscopic projection used confirm needle placement in all planes. Images permanently stored in EMR. Interpretation: I personally interpreted the imaging intraoperatively. Adequate needle placement confirmed in multiple planes. Appropriate spread of contrast into desired area was observed. No evidence of afferent or efferent intravascular uptake. Permanent images saved into the patient's record.  Antibiotic Prophylaxis:   Anti-infectives (From admission, onward)   None     Indication(s): None identified  Post-operative Assessment:  Post-procedure Vital Signs:  Pulse/HCG Rate: 6569 Temp: (!) 97 F (36.1 C) Resp: 16 BP: (!) 180/75 SpO2: 100 %  EBL: None  Complications: No immediate post-treatment complications observed by team, or reported by patient.  Note: The patient tolerated the entire procedure well. A repeat set of vitals were taken after the procedure and the patient was kept under observation following institutional policy, for this type of procedure. Post-procedural neurological assessment was performed, showing return to baseline, prior to discharge. The patient was provided with post-procedure discharge instructions, including a section on how to identify potential problems. Should any problems arise concerning this procedure, the patient was given instructions to immediately contact us, at any time, without hesitation. In any case, we plan to contact the patient by telephone for a follow-up status report regarding this interventional procedure.  Comments:  No additional relevant information.  Plan of Care   Imaging Orders     DG C-Arm 1-60 Min-No Report     DG HIP UNILAT W OR W/O PELVIS 2-3 VIEWS RIGHT  Procedure Orders     HIP INJECTION  Medications ordered for procedure: Meds ordered this encounter  Medications  . lidocaine (XYLOCAINE) 2 % (with pres) injection 400 mg  . midazolam (VERSED) 5 MG/5ML injection 1-2  mg    Make sure Flumazenil is available in the pyxis when using this medication. If oversedation occurs, administer 0.2 mg IV over 15 sec. If after 45 sec no response, administer 0.2 mg again over 1 min; may repeat at 1 min intervals; not to exceed 4 doses (1 mg)  . fentaNYL (SUBLIMAZE) injection 25-50  mcg    Make sure Narcan is available in the pyxis when using this medication. In the event of respiratory depression (RR< 8/min): Titrate NARCAN (naloxone) in increments of 0.1 to 0.2 mg IV at 2-3 minute intervals, until desired degree of reversal.  . lactated ringers infusion 1,000 mL  . ropivacaine (PF) 2 mg/mL (0.2%) (NAROPIN) injection 4 mL  . methylPREDNISolone acetate (DEPO-MEDROL) injection 80 mg  . iopamidol (ISOVUE-M) 41 % intrathecal injection 10 mL   Medications administered: We administered lidocaine, midazolam, fentaNYL, lactated ringers, ropivacaine (PF) 2 mg/mL (0.2%), methylPREDNISolone acetate, and iopamidol.  See the medical record for exact dosing, route, and time of administration.  Disposition: Discharge home  Discharge Date & Time: 12/11/2017; 0940 hrs.   Physician-requested Follow-up: Return for post-procedure eval (2 wks), w/ Dr. Dossie Arbour.  Future Appointments  Date Time Provider [place]  12/31/2017 11:15 AM Milinda Pointer, MD [facility] None  02/02/2018  8:30 AM Vevelyn Francois, NP [facility] None   Primary Care Physician: Randel Books, FNP Location: [facility] Out[facility] Pain Management Facility Note by: Gaspar Cola, MD Date: 12/11/2017; Time: 11:48 AM  Disclaimer:  Medicine is not an Chief Strategy Officer. The only guarantee in medicine is that nothing is guaranteed. It is important to note that the decision to proceed with this intervention was based on the information collected from the patient. The Data and conclusions were drawn from the patient's questionnaire, the interview, and the physical examination. Because the information was provided in  large part by the patient, it cannot be guaranteed that it has not been purposely or unconsciously manipulated. Every effort has been made to obtain as much relevant data as possible for this evaluation. It is important to note that the conclusions that lead to this procedure are derived in large part from the available data. Always take into account that the treatment will also be dependent on availability of resources and existing treatment guidelines, considered by other Pain Management Practitioners as being common knowledge and practice, at the time of the intervention. For Medico-Legal purposes, it is also important to point out that variation in procedural techniques and pharmacological choices are the acceptable norm. The indications, contraindications, technique, and results of the above procedure should only be interpreted and judged by a Board-Certified Interventional Pain Specialist with extensive familiarity and expertise in the same exact procedure and technique.

## 2017-12-11 NOTE — Patient Instructions (Signed)

## 2017-12-12 ENCOUNTER — Telehealth: Payer: Self-pay

## 2017-12-12 NOTE — Telephone Encounter (Signed)
Post procedure phone call.  LM 

## 2017-12-15 ENCOUNTER — Other Ambulatory Visit: Payer: Self-pay | Admitting: Nurse Practitioner

## 2017-12-15 DIAGNOSIS — M792 Neuralgia and neuritis, unspecified: Secondary | ICD-10-CM

## 2017-12-15 DIAGNOSIS — M797 Fibromyalgia: Secondary | ICD-10-CM

## 2017-12-26 DIAGNOSIS — E7849 Other hyperlipidemia: Secondary | ICD-10-CM | POA: Diagnosis not present

## 2017-12-26 DIAGNOSIS — I10 Essential (primary) hypertension: Secondary | ICD-10-CM | POA: Diagnosis not present

## 2017-12-30 DIAGNOSIS — I10 Essential (primary) hypertension: Secondary | ICD-10-CM | POA: Diagnosis not present

## 2017-12-30 DIAGNOSIS — E7849 Other hyperlipidemia: Secondary | ICD-10-CM | POA: Diagnosis not present

## 2017-12-31 ENCOUNTER — Ambulatory Visit: Payer: Medicare Other | Attending: Pain Medicine | Admitting: Pain Medicine

## 2017-12-31 ENCOUNTER — Encounter: Payer: Self-pay | Admitting: Pain Medicine

## 2017-12-31 ENCOUNTER — Other Ambulatory Visit: Payer: Self-pay

## 2017-12-31 VITALS — BP 156/75 | HR 76 | Temp 97.8°F | Resp 16 | Ht 61.0 in | Wt 159.0 lb

## 2017-12-31 DIAGNOSIS — M47896 Other spondylosis, lumbar region: Secondary | ICD-10-CM | POA: Diagnosis not present

## 2017-12-31 DIAGNOSIS — M5116 Intervertebral disc disorders with radiculopathy, lumbar region: Secondary | ICD-10-CM | POA: Diagnosis not present

## 2017-12-31 DIAGNOSIS — M797 Fibromyalgia: Secondary | ICD-10-CM | POA: Insufficient documentation

## 2017-12-31 DIAGNOSIS — F411 Generalized anxiety disorder: Secondary | ICD-10-CM | POA: Insufficient documentation

## 2017-12-31 DIAGNOSIS — M961 Postlaminectomy syndrome, not elsewhere classified: Secondary | ICD-10-CM | POA: Diagnosis not present

## 2017-12-31 DIAGNOSIS — Z5181 Encounter for therapeutic drug level monitoring: Secondary | ICD-10-CM | POA: Diagnosis not present

## 2017-12-31 DIAGNOSIS — G2581 Restless legs syndrome: Secondary | ICD-10-CM | POA: Diagnosis not present

## 2017-12-31 DIAGNOSIS — M79605 Pain in left leg: Secondary | ICD-10-CM | POA: Insufficient documentation

## 2017-12-31 DIAGNOSIS — G894 Chronic pain syndrome: Secondary | ICD-10-CM | POA: Diagnosis not present

## 2017-12-31 DIAGNOSIS — M25552 Pain in left hip: Secondary | ICD-10-CM | POA: Diagnosis not present

## 2017-12-31 DIAGNOSIS — I10 Essential (primary) hypertension: Secondary | ICD-10-CM | POA: Insufficient documentation

## 2017-12-31 DIAGNOSIS — Z886 Allergy status to analgesic agent status: Secondary | ICD-10-CM | POA: Insufficient documentation

## 2017-12-31 DIAGNOSIS — M47897 Other spondylosis, lumbosacral region: Secondary | ICD-10-CM | POA: Insufficient documentation

## 2017-12-31 DIAGNOSIS — Z87891 Personal history of nicotine dependence: Secondary | ICD-10-CM | POA: Insufficient documentation

## 2017-12-31 DIAGNOSIS — M5416 Radiculopathy, lumbar region: Secondary | ICD-10-CM | POA: Diagnosis not present

## 2017-12-31 DIAGNOSIS — K589 Irritable bowel syndrome without diarrhea: Secondary | ICD-10-CM | POA: Insufficient documentation

## 2017-12-31 DIAGNOSIS — M7062 Trochanteric bursitis, left hip: Secondary | ICD-10-CM | POA: Diagnosis not present

## 2017-12-31 DIAGNOSIS — G8929 Other chronic pain: Secondary | ICD-10-CM

## 2017-12-31 DIAGNOSIS — K219 Gastro-esophageal reflux disease without esophagitis: Secondary | ICD-10-CM | POA: Insufficient documentation

## 2017-12-31 DIAGNOSIS — M545 Low back pain: Secondary | ICD-10-CM | POA: Diagnosis not present

## 2017-12-31 DIAGNOSIS — G4700 Insomnia, unspecified: Secondary | ICD-10-CM | POA: Diagnosis not present

## 2017-12-31 DIAGNOSIS — M4310 Spondylolisthesis, site unspecified: Secondary | ICD-10-CM | POA: Diagnosis not present

## 2017-12-31 DIAGNOSIS — M48061 Spinal stenosis, lumbar region without neurogenic claudication: Secondary | ICD-10-CM | POA: Insufficient documentation

## 2017-12-31 DIAGNOSIS — Z885 Allergy status to narcotic agent status: Secondary | ICD-10-CM | POA: Insufficient documentation

## 2017-12-31 DIAGNOSIS — M8938 Hypertrophy of bone, other site: Secondary | ICD-10-CM | POA: Insufficient documentation

## 2017-12-31 DIAGNOSIS — Z79891 Long term (current) use of opiate analgesic: Secondary | ICD-10-CM | POA: Diagnosis not present

## 2017-12-31 DIAGNOSIS — Z8249 Family history of ischemic heart disease and other diseases of the circulatory system: Secondary | ICD-10-CM | POA: Insufficient documentation

## 2017-12-31 DIAGNOSIS — F329 Major depressive disorder, single episode, unspecified: Secondary | ICD-10-CM | POA: Diagnosis not present

## 2017-12-31 DIAGNOSIS — Z79899 Other long term (current) drug therapy: Secondary | ICD-10-CM | POA: Insufficient documentation

## 2017-12-31 DIAGNOSIS — E785 Hyperlipidemia, unspecified: Secondary | ICD-10-CM | POA: Insufficient documentation

## 2017-12-31 DIAGNOSIS — M533 Sacrococcygeal disorders, not elsewhere classified: Secondary | ICD-10-CM | POA: Diagnosis not present

## 2017-12-31 DIAGNOSIS — M5136 Other intervertebral disc degeneration, lumbar region: Secondary | ICD-10-CM | POA: Diagnosis not present

## 2017-12-31 DIAGNOSIS — M25551 Pain in right hip: Secondary | ICD-10-CM | POA: Diagnosis not present

## 2017-12-31 DIAGNOSIS — M1611 Unilateral primary osteoarthritis, right hip: Secondary | ICD-10-CM | POA: Diagnosis not present

## 2017-12-31 DIAGNOSIS — Z7982 Long term (current) use of aspirin: Secondary | ICD-10-CM | POA: Insufficient documentation

## 2017-12-31 DIAGNOSIS — J449 Chronic obstructive pulmonary disease, unspecified: Secondary | ICD-10-CM | POA: Diagnosis not present

## 2017-12-31 DIAGNOSIS — Z880 Allergy status to penicillin: Secondary | ICD-10-CM | POA: Insufficient documentation

## 2017-12-31 MED ORDER — ORPHENADRINE CITRATE 30 MG/ML IJ SOLN
60.0000 mg | Freq: Once | INTRAMUSCULAR | Status: AC
  Administered 2017-12-31: 60 mg via INTRAMUSCULAR
  Filled 2017-12-31: qty 2

## 2017-12-31 MED ORDER — KETOROLAC TROMETHAMINE 60 MG/2ML IM SOLN
60.0000 mg | Freq: Once | INTRAMUSCULAR | Status: AC
  Administered 2017-12-31: 60 mg via INTRAMUSCULAR
  Filled 2017-12-31: qty 2
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
## 2017-12-31 NOTE — Patient Instructions (Addendum)
____________________________________________________________________________________________  Preparing for your procedure (without sedation)  Instructions: . Oral Intake: Do not eat or drink anything for at least 3 hours prior to your procedure. . Transportation: Unless otherwise stated by your physician, you may drive yourself after the procedure. . Blood Pressure Medicine: Take your blood pressure medicine with a sip of water the morning of the procedure. . Blood thinners: Notify our staff if you are taking any blood thinners. Depending on which one you take, there will be specific instructions on how and when to stop it. . Diabetics on insulin: Notify the staff so that you can be scheduled 1st case in the morning. If your diabetes requires high dose insulin, take only  of your normal insulin dose the morning of the procedure and notify the staff that you have done so. . Preventing infections: Shower with an antibacterial soap the morning of your procedure.  . Build-up your immune system: Take 1000 mg of Vitamin C with every meal (3 times a day) the day prior to your procedure. Marland Kitchen Antibiotics: Inform the staff if you have a condition or reason that requires you to take antibiotics before dental procedures. . Pregnancy: If you are pregnant, call and cancel the procedure. . Sickness: If you have a cold, fever, or any active infections, call and cancel the procedure. . Arrival: You must be in the facility at least 30 minutes prior to your scheduled procedure. . Children: Do not bring any children with you. . Dress appropriately: Bring dark clothing that you would not mind if they get stained. . Valuables: Do not bring any jewelry or valuables.  Procedure appointments are reserved for interventional treatments only. Marland Kitchen No Prescription Refills. . No medication changes will be discussed during procedure appointments. . No disability issues will be discussed.  Reasons to call and reschedule or  cancel your procedure: (Following these recommendations will minimize the risk of a serious complication.) . Surgeries: Avoid having procedures within 2 weeks of any surgery. (Avoid for 2 weeks before or after any surgery). . Flu Shots: Avoid having procedures within 2 weeks of a flu shots or . (Avoid for 2 weeks before or after immunizations). . Barium: Avoid having a procedure within 7-10 days after having had a radiological study involving the use of radiological contrast. (Myelograms, Barium swallow or enema study). . Heart attacks: Avoid any elective procedures or surgeries for the initial 6 months after a "Myocardial Infarction" (Heart Attack). . Blood thinners: It is imperative that you stop these medications before procedures. Let us know if you if you take any blood thinner.  . Infection: Avoid procedures during or within two weeks of an infection (including chest colds or gastrointestinal problems). Symptoms associated with infections include: Localized redness, fever, chills, night sweats or profuse sweating, burning sensation when voiding, cough, congestion, stuffiness, runny nose, sore throat, diarrhea, nausea, vomiting, cold or Flu symptoms, recent or current infections. It is specially important if the infection is over the area that we intend to treat. Marland Kitchen Heart and lung problems: Symptoms that may suggest an active cardiopulmonary problem include: cough, chest pain, breathing difficulties or shortness of breath, dizziness, ankle swelling, uncontrolled high or unusually low blood pressure, and/or palpitations. If you are experiencing any of these symptoms, cancel your procedure and contact your primary care physician for an evaluation.  Remember:  Regular Business hours are:  Monday to Thursday 8:00 AM to 4:00 PM  Provider's Schedule: Milinda Pointer, MD:  Procedure days: Tuesday and Thursday 7:30 AM to  4:00 PM  Bilal Lateef, MD:  Procedure days: Monday and Wednesday 7:30 AM to 4:00  PM ____________________________________________________________________________________________    

## 2017-12-31 NOTE — Progress Notes (Signed)
Safety precautions to be maintained throughout the outpatient stay will include: orient to surroundings, keep bed in low position, maintain call bell within reach at all times, provide assistance with transfer out of bed and ambulation.  

## 2018-01-01 ENCOUNTER — Other Ambulatory Visit: Payer: Self-pay | Admitting: Nurse Practitioner

## 2018-01-01 DIAGNOSIS — K219 Gastro-esophageal reflux disease without esophagitis: Secondary | ICD-10-CM | POA: Diagnosis not present

## 2018-01-01 DIAGNOSIS — I10 Essential (primary) hypertension: Secondary | ICD-10-CM | POA: Diagnosis not present

## 2018-01-01 DIAGNOSIS — F411 Generalized anxiety disorder: Secondary | ICD-10-CM | POA: Diagnosis not present

## 2018-01-01 DIAGNOSIS — M150 Primary generalized (osteo)arthritis: Secondary | ICD-10-CM | POA: Diagnosis not present

## 2018-01-01 DIAGNOSIS — E7849 Other hyperlipidemia: Secondary | ICD-10-CM | POA: Diagnosis not present

## 2018-01-08 ENCOUNTER — Encounter: Payer: Self-pay | Admitting: Pain Medicine

## 2018-01-08 ENCOUNTER — Ambulatory Visit (HOSPITAL_BASED_OUTPATIENT_CLINIC_OR_DEPARTMENT_OTHER): Payer: Medicare Other | Admitting: Pain Medicine

## 2018-01-08 ENCOUNTER — Other Ambulatory Visit: Payer: Self-pay

## 2018-01-08 ENCOUNTER — Ambulatory Visit
Admission: RE | Admit: 2018-01-08 | Discharge: 2018-01-08 | Disposition: A | Discharge status: Home / Self Care | Payer: Medicare Other | Source: Ambulatory Visit | Attending: Pain Medicine | Admitting: Pain Medicine

## 2018-01-08 VITALS — BP 110/64 | HR 59 | Temp 97.8°F | Resp 18 | Ht 61.0 in | Wt 158.0 lb

## 2018-01-08 DIAGNOSIS — M79605 Pain in left leg: Secondary | ICD-10-CM | POA: Insufficient documentation

## 2018-01-08 DIAGNOSIS — M48061 Spinal stenosis, lumbar region without neurogenic claudication: Secondary | ICD-10-CM

## 2018-01-08 DIAGNOSIS — G8929 Other chronic pain: Secondary | ICD-10-CM | POA: Insufficient documentation

## 2018-01-08 DIAGNOSIS — M545 Low back pain: Secondary | ICD-10-CM | POA: Diagnosis not present

## 2018-01-08 DIAGNOSIS — M5136 Other intervertebral disc degeneration, lumbar region: Secondary | ICD-10-CM | POA: Insufficient documentation

## 2018-01-08 MED ORDER — TRIAMCINOLONE ACETONIDE 40 MG/ML IJ SUSP
40.0000 mg | Freq: Once | INTRAMUSCULAR | Status: AC
  Administered 2018-01-08: 40 mg
  Filled 2018-01-08: qty 1

## 2018-01-08 MED ORDER — ROPIVACAINE HCL 2 MG/ML IJ SOLN
2.0000 mL | Freq: Once | INTRAMUSCULAR | Status: AC
  Administered 2018-01-08: 2 mL via EPIDURAL
  Filled 2018-01-08: qty 10

## 2018-01-08 MED ORDER — LIDOCAINE HCL 2 % IJ SOLN
20.0000 mL | Freq: Once | INTRAMUSCULAR | Status: AC
  Administered 2018-01-08: 400 mg
  Filled 2018-01-08: qty 40

## 2018-01-08 MED ORDER — SODIUM CHLORIDE 0.9% FLUSH
2.0000 mL | Freq: Once | INTRAVENOUS | Status: AC
  Administered 2018-01-08: 2 mL

## 2018-01-08 MED ORDER — IOPAMIDOL (ISOVUE-M 200) INJECTION 41%
10.0000 mL | Freq: Once | INTRAMUSCULAR | Status: AC
  Administered 2018-01-08: 10 mL via EPIDURAL
  Filled 2018-01-08: qty 10

## 2018-01-08 NOTE — Progress Notes (Signed)
Safety precautions to be maintained throughout the outpatient stay will include: orient to surroundings, keep bed in low position, maintain call bell within reach at all times, provide assistance with transfer out of bed and ambulation.  

## 2018-01-08 NOTE — Progress Notes (Signed)
Patient's Name: [name]  MRN: [id number]  Referring Provider: Randel Books, FNP  DOB: [birth year]  PCP: Randel Books, FNP  DOS: 01/08/2018  Note by: Gaspar Cola, MD  Service setting: Ambulatory outpatient  Specialty: Interventional Pain Management  Patient type: Established  Location: [facility] (AMB) Pain Management Facility  Visit type: Interventional Procedure   Primary Reason for Visit: Interventional Pain Management Treatment. CC: Back Pain (low left)  Procedure:          Anesthesia, Analgesia, Anxiolysis:  Type: Therapeutic Inter-Laminar Epidural Steroid Injection           Region: Lumbar Level: L2-3 Level. Laterality: Right-Sided Paramedial  Type: Local Anesthesia Indication(s): Analgesia         Route: Infiltration ([place]/IM) IV Access: Declined Sedation: Declined  Local Anesthetic: Lidocaine 1-2%  Position: Prone with head of the table was raised to facilitate breathing.   Indications: 1. DDD (degenerative disc disease), lumbar   2. Spinal stenosis of lumbar region without neurogenic claudication   3. Chronic low back pain (Primary Source of Pain) (Bilateral) (L>R)   4. Chronic lower extremity pain (Secondary source of pain) (Left)    Pain Score: Pre-procedure: 3 /10 Post-procedure: 0-No pain/10  Pre-op Assessment:  [name] is a 72 y.o. (year old), female patient, seen today for interventional treatment. She  has a past surgical history that includes Vaginal hysterectomy (1975); Incontinence surgery (2001); Oophorectomy (1978); Elbow Arthroplasty (1990); Cholecystectomy (1989); Rectocele repair (2009); back implant (2015); Mastectomy partial / lumpectomy (Bilateral, 1978 ); Augmentation mammaplasty (Bilateral, 1978); Colonoscopy with propofol (N/A, 01/29/2017); Spinal cord stimulator implant; and Esophagogastroduodenoscopy (egd) with propofol (N/A, 11/07/2017). [name] has a current medication list which includes the following prescription(s):  albuterol, alendronate, alprazolam, aspirin ec, atenolol, calcium, cetirizine, cholecalciferol, cinnamon, citalopram, conjugated estrogens, fenofibrate, fish oil-flax oil-borage oil, fluticasone-salmeterol, furosemide, gabapentin, meclizine, melatonin, vitamin k2, montelukast, omega 3-6-9 complex, oxycodone, ropinirole, tizanidine, zinc gluconate, zolpidem, and oxycodone. Her primarily concern today is the Back Pain (low left)  Initial Vital Signs:  Pulse/HCG Rate: 70  Temp: 97.8 F (36.6 C) Resp: 18 BP: 109/70 SpO2: 98 %  BMI: Estimated body mass index is 29.85 kg/m as calculated from the following:   Height as of this encounter: 5\' 1"  (1.549 m).   Weight as of this encounter: 158 lb (71.7 kg).  Risk Assessment: Allergies: Reviewed. She is allergic to aspirin; morphine; vicodin [hydrocodone-acetaminophen]; niacin and related; and penicillins.  Allergy Precautions: None required Coagulopathies: Reviewed. None identified.  Blood-thinner therapy: None at this time Active Infection(s): Reviewed. None identified. [name] is afebrile  Site Confirmation: [name] was asked to confirm the procedure and laterality before marking the site Procedure checklist: Completed Consent: Before the procedure and under the influence of no sedative(s), amnesic(s), or anxiolytics, the patient was informed of the treatment options, risks and possible complications. To fulfill our ethical and legal obligations, as recommended by the American Medical Association's Code of Ethics, I have informed the patient of my clinical impression; the nature and purpose of the treatment or procedure; the risks, benefits, and possible complications of the intervention; the alternatives, including doing nothing; the risk(s) and benefit(s) of the alternative treatment(s) or procedure(s); and the risk(s) and benefit(s) of doing nothing. The patient was provided information about the general risks and possible complications  associated with the procedure. These may include, but are not limited to: failure to achieve desired goals, infection, bleeding, organ or nerve damage, allergic reactions, paralysis, and death. In addition,  the patient was informed of those risks and complications associated to Spine-related procedures, such as failure to decrease pain; infection (i.e.: Meningitis, epidural or intraspinal abscess); bleeding (i.e.: epidural hematoma, subarachnoid hemorrhage, or any other type of intraspinal or peri-dural bleeding); organ or nerve damage (i.e.: Any type of peripheral nerve, nerve root, or spinal cord injury) with subsequent damage to sensory, motor, and/or autonomic systems, resulting in permanent pain, numbness, and/or weakness of one or several areas of the body; allergic reactions; (i.e.: anaphylactic reaction); and/or death. Furthermore, the patient was informed of those risks and complications associated with the medications. These include, but are not limited to: allergic reactions (i.e.: anaphylactic or anaphylactoid reaction(s)); adrenal axis suppression; blood sugar elevation that in diabetics may result in ketoacidosis or comma; water retention that in patients with history of congestive heart failure may result in shortness of breath, pulmonary edema, and decompensation with resultant heart failure; weight gain; swelling or edema; medication-induced neural toxicity; particulate matter embolism and blood vessel occlusion with resultant organ, and/or nervous system infarction; and/or aseptic necrosis of one or more joints. Finally, the patient was informed that Medicine is not an exact science; therefore, there is also the possibility of unforeseen or unpredictable risks and/or possible complications that may result in a catastrophic outcome. The patient indicated having understood very clearly. We have given the patient no guarantees and we have made no promises. Enough time was given to the patient to  ask questions, all of which were answered to the patient's satisfaction. [name] has indicated that she wanted to continue with the procedure. Attestation: I, the ordering provider, attest that I have discussed with the patient the benefits, risks, side-effects, alternatives, likelihood of achieving goals, and potential problems during recovery for the procedure that I have provided informed consent. Date  Time: 01/08/2018  8:54 AM  Pre-Procedure Preparation:  Monitoring: As per clinic protocol. Respiration, ETCO2, SpO2, BP, heart rate and rhythm monitor placed and checked for adequate function Safety Precautions: Patient was assessed for positional comfort and pressure points before starting the procedure. Time-out: I initiated and conducted the "Time-out" before starting the procedure, as per protocol. The patient was asked to participate by confirming the accuracy of the "Time Out" information. Verification of the correct person, site, and procedure were performed and confirmed by me, the nursing staff, and the patient. "Time-out" conducted as per Joint Commission's Universal Protocol (UP.01.01.01). Time: 1005  Description of Procedure:          Target Area: The interlaminar space, initially targeting the lower laminar border of the superior vertebral body. Approach: Paramedial approach. Area Prepped: Entire Posterior Lumbar Region Prepping solution: ChloraPrep (2% chlorhexidine gluconate and 70% isopropyl alcohol) Safety Precautions: Aspiration looking for blood return was conducted prior to all injections. At no point did we inject any substances, as a needle was being advanced. No attempts were made at seeking any paresthesias. Safe injection practices and needle disposal techniques used. Medications properly checked for expiration dates. SDV (single dose vial) medications used. Description of the Procedure: Protocol guidelines were followed. The procedure needle was introduced through  the skin, ipsilateral to the reported pain, and advanced to the target area. Bone was contacted and the needle walked caudad, until the lamina was cleared. The epidural space was identified using "loss-of-resistance technique" with 2-3 ml of PF-NaCl (0.9% NSS), in a 5cc LOR glass syringe.  Vitals:   01/08/18 0853 01/08/18 0955 01/08/18 1005 01/08/18 1013  BP: 109/70 110/65 109/63 110/64  Pulse: 70 (!)  59 (!) 58 (!) 59  Resp: 18 17 17 18   Temp: 97.8 F (36.6 C)     SpO2: 98% 92% 92% 93%  Weight: 158 lb (71.7 kg)     Height: 5\' 1"  (1.549 m)       Start Time: 1005 hrs. End Time: 1011 hrs.  Materials:  Needle(s) Type: Epidural needle Gauge: 17G Length: 3.5-in Medication(s): Please see orders for medications and dosing details.  Imaging Guidance (Spinal):          Type of Imaging Technique: Fluoroscopy Guidance (Spinal) Indication(s): Assistance in needle guidance and placement for procedures requiring needle placement in or near specific anatomical locations not easily accessible without such assistance. Exposure Time: Please see nurses notes. Contrast: Before injecting any contrast, we confirmed that the patient did not have an allergy to iodine, shellfish, or radiological contrast. Once satisfactory needle placement was completed at the desired level, radiological contrast was injected. Contrast injected under live fluoroscopy. No contrast complications. See chart for type and volume of contrast used. Fluoroscopic Guidance: I was personally present during the use of fluoroscopy. "Tunnel Vision Technique" used to obtain the best possible view of the target area. Parallax error corrected before commencing the procedure. "Direction-depth-direction" technique used to introduce the needle under continuous pulsed fluoroscopy. Once target was reached, antero-posterior, oblique, and lateral fluoroscopic projection used confirm needle placement in all planes. Images permanently stored in EMR.           Interpretation: I personally interpreted the imaging intraoperatively. Adequate needle placement confirmed in multiple planes. Appropriate spread of contrast into desired area was observed. No evidence of afferent or efferent intravascular uptake. No intrathecal or subarachnoid spread observed. Permanent images saved into the patient's record.  Antibiotic Prophylaxis:   Anti-infectives (From admission, onward)   None     Indication(s): None identified  Post-operative Assessment:  Post-procedure Vital Signs:  Pulse/HCG Rate: (!) 59  Temp: 97.8 F (36.6 C) Resp: 18 BP: 110/64 SpO2: 93 %  EBL: None  Complications: No immediate post-treatment complications observed by team, or reported by patient.  Note: The patient tolerated the entire procedure well. A repeat set of vitals were taken after the procedure and the patient was kept under observation following institutional policy, for this type of procedure. Post-procedural neurological assessment was performed, showing return to baseline, prior to discharge. The patient was provided with post-procedure discharge instructions, including a section on how to identify potential problems. Should any problems arise concerning this procedure, the patient was given instructions to immediately contact us, at any time, without hesitation. In any case, we plan to contact the patient by telephone for a follow-up status report regarding this interventional procedure.  Comments:  No additional relevant information.  Plan of Care    Imaging Orders     DG C-Arm 1-60 Min-No Report  Procedure Orders     Lumbar Epidural Injection  Medications ordered for procedure: Meds ordered this encounter  Medications  . iopamidol (ISOVUE-M) 41 % intrathecal injection 10 mL    Must be Myelogram-compatible. If not available, you may substitute with a water-soluble, non-ionic, hypoallergenic, myelogram-compatible radiological contrast medium.  Marland Kitchen lidocaine  (XYLOCAINE) 2 % (with pres) injection 400 mg  . sodium chloride flush (NS) 0.9 % injection 2 mL  . ropivacaine (PF) 2 mg/mL (0.2%) (NAROPIN) injection 2 mL  . triamcinolone acetonide (KENALOG-40) injection 40 mg   Medications administered: We administered iopamidol, lidocaine, sodium chloride flush, ropivacaine (PF) 2 mg/mL (0.2%), and triamcinolone acetonide.  See  the medical record for exact dosing, route, and time of administration.  Disposition: Discharge home  Discharge Date & Time: 01/08/2018; 1014 hrs.   Physician-requested Follow-up: Return for post-procedure eval (2 wks), w/ Dionisio David, NP.  Future Appointments  Date Time Provider [place]  02/02/2018  8:30 AM Vevelyn Francois, NP [facility] None  02/10/2018  7:45 AM Milinda Pointer, MD [facility] None   Primary Care Physician: Randel Books, FNP Location: [facility] Facility Note by: Gaspar Cola, MD Date: 01/08/2018; Time: 11:03 AM  Disclaimer:  Medicine is not an Chief Strategy Officer. The only guarantee in medicine is that nothing is guaranteed. It is important to note that the decision to proceed with this intervention was based on the information collected from the patient. The Data and conclusions were drawn from the patient's questionnaire, the interview, and the physical examination. Because the information was provided in large part by the patient, it cannot be guaranteed that it has not been purposely or unconsciously manipulated. Every effort has been made to obtain as much relevant data as possible for this evaluation. It is important to note that the conclusions that lead to this procedure are derived in large part from the available data. Always take into account that the treatment will also be dependent on availability of resources and existing treatment guidelines, considered by other Pain Management Practitioners as being common knowledge and practice, at the time of the  intervention. For Medico-Legal purposes, it is also important to point out that variation in procedural techniques and pharmacological choices are the acceptable norm. The indications, contraindications, technique, and results of the above procedure should only be interpreted and judged by a Board-Certified Interventional Pain Specialist with extensive familiarity and expertise in the same exact procedure and technique.

## 2018-01-08 NOTE — Patient Instructions (Signed)

## 2018-01-09 ENCOUNTER — Telehealth: Payer: Self-pay | Admitting: *Deleted

## 2018-01-09 NOTE — Telephone Encounter (Signed)
Voicemail left with patient to call our office if there are any question or concerns re; procedure on yesterday.

## 2018-01-14 ENCOUNTER — Encounter: Payer: Medicare Other | Admitting: Nurse Practitioner

## 2018-01-27 DIAGNOSIS — I10 Essential (primary) hypertension: Secondary | ICD-10-CM | POA: Diagnosis not present

## 2018-01-27 DIAGNOSIS — M810 Age-related osteoporosis without current pathological fracture: Secondary | ICD-10-CM | POA: Diagnosis not present

## 2018-01-27 DIAGNOSIS — E7849 Other hyperlipidemia: Secondary | ICD-10-CM | POA: Diagnosis not present

## 2018-02-02 ENCOUNTER — Encounter: Payer: Medicare Other | Admitting: Nurse Practitioner

## 2018-02-09 ENCOUNTER — Other Ambulatory Visit: Payer: Self-pay

## 2018-02-09 ENCOUNTER — Encounter: Payer: Self-pay | Admitting: Nurse Practitioner

## 2018-02-09 ENCOUNTER — Ambulatory Visit: Payer: Medicare Other | Attending: Nurse Practitioner | Admitting: Nurse Practitioner

## 2018-02-09 VITALS — BP 128/82 | HR 61 | Temp 97.7°F | Ht 61.0 in | Wt 158.0 lb

## 2018-02-09 DIAGNOSIS — M25552 Pain in left hip: Secondary | ICD-10-CM | POA: Insufficient documentation

## 2018-02-09 DIAGNOSIS — M792 Neuralgia and neuritis, unspecified: Secondary | ICD-10-CM | POA: Diagnosis present

## 2018-02-09 DIAGNOSIS — M797 Fibromyalgia: Secondary | ICD-10-CM | POA: Diagnosis present

## 2018-02-09 DIAGNOSIS — M47816 Spondylosis without myelopathy or radiculopathy, lumbar region: Secondary | ICD-10-CM | POA: Diagnosis present

## 2018-02-09 DIAGNOSIS — M7918 Myalgia, other site: Secondary | ICD-10-CM | POA: Insufficient documentation

## 2018-02-09 DIAGNOSIS — Z79891 Long term (current) use of opiate analgesic: Secondary | ICD-10-CM | POA: Insufficient documentation

## 2018-02-09 DIAGNOSIS — G894 Chronic pain syndrome: Secondary | ICD-10-CM | POA: Insufficient documentation

## 2018-02-09 DIAGNOSIS — M25551 Pain in right hip: Secondary | ICD-10-CM | POA: Diagnosis present

## 2018-02-09 DIAGNOSIS — M533 Sacrococcygeal disorders, not elsewhere classified: Secondary | ICD-10-CM | POA: Diagnosis present

## 2018-02-09 DIAGNOSIS — G8929 Other chronic pain: Secondary | ICD-10-CM | POA: Diagnosis present

## 2018-02-09 MED ORDER — OXYCODONE HCL 5 MG PO TABS: 5.0000 mg | ORAL_TABLET | Freq: Four times a day (QID) | ORAL | 0 refills | Status: DC | PRN

## 2018-02-09 NOTE — Progress Notes (Signed)
Nursing Pain Medication Assessment:  Safety precautions to be maintained throughout the outpatient stay will include: orient to surroundings, keep bed in low position, maintain call bell within reach at all times, provide assistance with transfer out of bed and ambulation.  Medication Inspection Compliance: Pill count conducted under aseptic conditions, in front of the patient. Neither the pills nor the bottle was removed from the patient's sight at any time. Once count was completed pills were immediately returned to the patient in their original bottle.  Medication: Oxycodone IR Pill/Patch Count: 82 of 120 pills remain Pill/Patch Appearance: Markings consistent with prescribed medication Bottle Appearance: Standard pharmacy container. Clearly labeled. Filled Date: 79 / 31 / 2019 Last Medication intake:  Yesterday

## 2018-02-09 NOTE — Patient Instructions (Addendum)
____________________________________________________________________________________________  Medication Rules  Purpose: To inform patients, and their family members, of our rules and regulations.  Applies to: All patients receiving prescriptions (written or electronic).  Pharmacy of record: Pharmacy where electronic prescriptions will be sent. If written prescriptions are taken to a different pharmacy, please inform the nursing staff. The pharmacy listed in the electronic medical record should be the one where you would like electronic prescriptions to be sent.  Electronic prescriptions: In compliance with the [place] Strengthen Opioid Misuse Prevention (STOP) Act of 2017 (Session Law 2017-74/H243), effective January 28, 2018, all controlled substances must be electronically prescribed. Calling prescriptions to the pharmacy will cease to exist.  Prescription refills: Only during scheduled appointments. Applies to all prescriptions.  NOTE: The following applies primarily to controlled substances (Opioid* Pain Medications).   Patient's responsibilities: 1. Pain Pills: Bring all pain pills to every appointment (except for procedure appointments). 2. Pill Bottles: Bring pills in original pharmacy bottle. Always bring the newest bottle. Bring bottle, even if empty. 3. Medication refills: You are responsible for knowing and keeping track of what medications you take and those you need refilled. The day before your appointment: write a list of all prescriptions that need to be refilled. The day of the appointment: give the list to the admitting nurse. Prescriptions will be written only during appointments. If you forget a medication: it will not be "Called in", "Faxed", or "electronically sent". You will need to get another appointment to get these prescribed. No early refills. Do not call asking to have your prescription filled early. 4. Prescription Accuracy: You are responsible for  carefully inspecting your prescriptions before leaving our office. Have the discharge nurse carefully go over each prescription with you, before taking them home. Make sure that your name is accurately spelled, that your address is correct. Check the name and dose of your medication to make sure it is accurate. Check the number of pills, and the written instructions to make sure they are clear and accurate. Make sure that you are given enough medication to last until your next medication refill appointment. 5. Taking Medication: Take medication as prescribed. When it comes to controlled substances, taking less pills or less frequently than prescribed is permitted and encouraged. Never take more pills than instructed. Never take medication more frequently than prescribed.  6. Inform other Doctors: Always inform, all of your healthcare providers, of all the medications you take. 7. Pain Medication from other Providers: You are not allowed to accept any additional pain medication from any other Doctor or Healthcare provider. There are two exceptions to this rule. (see below) In the event that you require additional pain medication, you are responsible for notifying us, as stated below. 8. Medication Agreement: You are responsible for carefully reading and following our Medication Agreement. This must be signed before receiving any prescriptions from our practice. Safely store a copy of your signed Agreement. Violations to the Agreement will result in no further prescriptions. (Additional copies of our Medication Agreement are available upon request.) 9. Laws, Rules, & Regulations: All patients are expected to follow all Federal and State Laws, Statutes, Rules, & Regulations. Ignorance of the Laws does not constitute a valid excuse. The use of any illegal substances is prohibited. 10. Adopted CDC guidelines & recommendations: Target dosing levels will be at or below 60 MME/day. Use of benzodiazepines** is not  recommended.  Exceptions: There are only two exceptions to the rule of not receiving pain medications from other Healthcare Providers. 1.   Exception #1 (Emergencies): In the event of an emergency (i.e.: accident requiring emergency care), you are allowed to receive additional pain medication. However, you are responsible for: As soon as you are able, call our office [phone], at any time of the day or night, and leave a message stating your name, the date and nature of the emergency, and the name and dose of the medication prescribed. In the event that your call is answered by a member of our staff, make sure to document and save the date, time, and the name of the person that took your information.  2. Exception #2 (Planned Surgery): In the event that you are scheduled by another doctor or dentist to have any type of surgery or procedure, you are allowed (for a period no longer than 30 days), to receive additional pain medication, for the acute post-op pain. However, in this case, you are responsible for picking up a copy of our "Post-op Pain Management for Surgeons" handout, and giving it to your surgeon or dentist. This document is available at our office, and does not require an appointment to obtain it. Simply go to our office during business hours (Monday-Thursday from 8:00 AM to 4:00 PM) (Friday 8:00 AM to 12:00 Noon) or if you have a scheduled appointment with [place], prior to your surgery, and ask for it by name. In addition, you will need to provide [place] with your name, name of your surgeon, type of surgery, and date of procedure or surgery.  *Opioid medications include: morphine, codeine, oxycodone, oxymorphone, hydrocodone, hydromorphone, meperidine, tramadol, tapentadol, buprenorphine, fentanyl, methadone. **Benzodiazepine medications include: diazepam (Valium), alprazolam (Xanax), clonazepam (Klonopine), lorazepam (Ativan), clorazepate (Tranxene), chlordiazepoxide (Librium), estazolam (Prosom),  oxazepam (Serax), temazepam (Restoril), triazolam (Halcion) (Last updated: 03/27/2017) ____________________________________________________________________________________________   BMI Assessment: Estimated body mass index is 29.85 kg/m as calculated from the following:   Height as of this encounter: 5\' 1"  (1.549 m).   Weight as of this encounter: 158 lb (71.7 kg).  BMI interpretation table: BMI level Category Range association with higher incidence of chronic pain  <18 kg/m2 Underweight   18.5-24.9 kg/m2 Ideal body weight   25-29.9 kg/m2 Overweight Increased incidence by 20%  30-34.9 kg/m2 Obese (Class I) Increased incidence by 68%  35-39.9 kg/m2 Severe obesity (Class II) Increased incidence by 136%  >40 kg/m2 Extreme obesity (Class III) Increased incidence by 254%   Patient's current BMI Ideal Body weight  Body mass index is 29.85 kg/m. Ideal body weight: 47.8 kg (105 lb 6.1 oz) Adjusted ideal body weight: 57.3 kg (126 lb 6.8 oz)   BMI Readings from Last 4 Encounters:  02/09/18 29.85 kg/m  01/08/18 29.85 kg/m  12/31/17 30.04 kg/m  12/11/17 30.42 kg/m   Wt Readings from Last 4 Encounters:  02/09/18 158 lb (71.7 kg)  01/08/18 158 lb (71.7 kg)  12/31/17 159 lb (72.1 kg)  12/11/17 161 lb (73 kg)

## 2018-02-09 NOTE — Progress Notes (Signed)
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 by time variables.  Patient instructions provided during this appointment: Patient Instructions   ____________________________________________________________________________________________  Medication Rules  Purpose: To inform patients, and their family members, of our rules and regulations.  Applies to: All patients receiving prescriptions (written or electronic).  Pharmacy of record: Pharmacy where electronic prescriptions will be sent. If written prescriptions are taken to a different pharmacy, please inform the nursing staff. The pharmacy listed in the electronic medical record should be the one where you would like electronic prescriptions to be sent.  Electronic prescriptions: In compliance with the [place] (STOP) Act of 2017 (Session Lanny Cramp [phone]), effective January 28, 2018, all controlled  substances must be electronically prescribed. Calling prescriptions to the pharmacy will cease to exist.  Prescription refills: Only during scheduled appointments. Applies to all prescriptions.  NOTE: The following applies primarily to controlled substances (Opioid* Pain Medications).   Patient's responsibilities: 1. Pain Pills: Bring all pain pills to every appointment (except for procedure appointments). 2. Pill Bottles: Bring pills in original pharmacy bottle. Always bring the newest bottle. Bring bottle, even if empty. 3. Medication refills: You are responsible for knowing and keeping track of what medications you take and those you need refilled. The day before your appointment: write a list of all prescriptions that need to be refilled. The day of the appointment: give the list to the admitting nurse. Prescriptions will be written only during appointments. If you forget a medication: it will not be "Called in", "Faxed", or "electronically sent". You will need to get another appointment to get these prescribed. No early refills. Do not call asking to have your prescription filled early. 4. Prescription Accuracy: You are responsible for carefully inspecting your prescriptions before leaving our office. Have the discharge nurse carefully go over each prescription with you, before taking them home. Make sure that your name is accurately spelled, that your address is correct. Check the name and dose of your medication to make sure it is accurate. Check the number of pills, and the written instructions to make sure they are clear and accurate. Make sure that you are given enough medication to last until your next medication refill appointment. 5. Taking Medication: Take medication as prescribed. When it comes to controlled substances, taking less pills or less frequently than prescribed is permitted and encouraged. Never take more pills than instructed. Never take medication more frequently than  prescribed.  6. Inform other Doctors: Always inform, all of your healthcare providers, of all the medications you take. 7. Pain Medication from other Providers: You are not allowed to accept any additional pain medication from any other Doctor or Healthcare provider. There are two exceptions to this rule. (see below) In the event that you require additional pain medication, you are responsible for notifying us, as stated below. 8. Medication Agreement: You are responsible for carefully reading and following our Medication Agreement. This must be signed before receiving any prescriptions from our practice. Safely store a copy of your signed Agreement. Violations to the Agreement will result in no further prescriptions. (Additional copies of our Medication Agreement are available upon request.) 9. Laws, Rules, & Regulations: All patients are expected to follow all Federal and [employer], [employer], Rules, [employer]. Ignorance of the  Laws does not constitute a valid excuse. The use of any illegal substances is prohibited. 10. Adopted CDC guidelines & recommendations: Target dosing levels will be at or below 60 MME/day. Use of benzodiazepines** is not recommended.  Exceptions: There are only two exceptions to the rule of not receiving pain medications from other Healthcare Providers. 1. Exception #1 (Emergencies): In the event of an emergency (i.e.: accident requiring emergency care), you are allowed to receive additional pain medication. However, you are responsible for: As soon as you are able, call our office [phone], at any time of the day or night, and leave a message stating your name, the date and nature of the emergency, and the name and dose of the medication prescribed. In the event that your call is answered by a member of our staff, make sure to document and save the date, time, and the name of the person that took your information.  2. Exception #2 (Planned Surgery): In the event that you  are scheduled by another doctor or dentist to have any type of surgery or procedure, you are allowed (for a period no longer than 30 days), to receive additional pain medication, for the acute post-op pain. However, in this case, you are responsible for picking up a copy of our "Post-op Pain Management for Surgeons" handout, and giving it to your surgeon or dentist. This document is available at our office, and does not require an appointment to obtain it. Simply go to our office during business hours (Monday-Thursday from 8:00 AM to 4:00 PM) (Friday 8:00 AM to 12:00 Noon) or if you have a scheduled appointment with [place], prior to your surgery, and ask for it by name. In addition, you will need to provide [place] with your name, name of your surgeon, type of surgery, and date of procedure or surgery.  *Opioid medications include: morphine, codeine, oxycodone, oxymorphone, hydrocodone, hydromorphone, meperidine, tramadol, tapentadol, buprenorphine, fentanyl, methadone. **Benzodiazepine medications include: diazepam (Valium), alprazolam (Xanax), clonazepam (Klonopine), lorazepam (Ativan), clorazepate (Tranxene), chlordiazepoxide (Librium), estazolam (Prosom), oxazepam (Serax), temazepam (Restoril), triazolam (Halcion) (Last updated: 03/27/2017) ____________________________________________________________________________________________   BMI Assessment: Estimated body mass index is 29.85 kg/m as calculated from the following:   Height as of this encounter: _0  (1.549 m).   Weight as of this encounter: 158 lb (71.7 kg).  BMI interpretation table: BMI level Category Range association with higher incidence of chronic pain  <18 kg/m2 Underweight   18.5-24.9 kg/m2 Ideal body weight   25-29.9 kg/m2 Overweight Increased incidence by 20%  30-34.9 kg/m2 Obese (Class I) Increased incidence by 68%  35-39.9 kg/m2 Severe obesity (Class II) Increased incidence by 136%  >40 kg/m2 Extreme obesity (Class III) Increased  incidence by 254%   Patient's current BMI Ideal Body weight  Body mass index is 29.85 kg/m. Ideal body weight: 47.8 kg (105 lb 6.1 oz) Adjusted ideal body weight: 57.3 kg (126 lb 6.8 oz)   BMI Readings from Last 4 Encounters:  02/09/18 29.85 kg/m  01/08/18 29.85 kg/m  12/31/17 30.04 kg/m  12/11/17 30.42 kg/m   Wt Readings from Last 4 Encounters:  02/09/18 158 lb (71.7 kg)  01/08/18 158 lb (71.7 kg)  12/31/17 159 lb (72.1 kg)  12/11/17 161 lb (73 kg)

## 2018-02-10 ENCOUNTER — Encounter: Payer: Self-pay | Admitting: Pain Medicine

## 2018-02-10 ENCOUNTER — Ambulatory Visit (HOSPITAL_BASED_OUTPATIENT_CLINIC_OR_DEPARTMENT_OTHER): Payer: Medicare Other | Admitting: Pain Medicine

## 2018-02-10 ENCOUNTER — Other Ambulatory Visit: Payer: Self-pay

## 2018-02-10 ENCOUNTER — Ambulatory Visit
Admission: RE | Admit: 2018-02-10 | Discharge: 2018-02-10 | Disposition: A | Discharge status: Home / Self Care | Payer: Medicare Other | Source: Ambulatory Visit | Attending: Pain Medicine | Admitting: Pain Medicine

## 2018-02-10 VITALS — BP 102/77 | HR 59 | Temp 98.4°F | Resp 14 | Ht 62.0 in | Wt 158.0 lb

## 2018-02-10 DIAGNOSIS — M545 Low back pain, unspecified: Secondary | ICD-10-CM

## 2018-02-10 DIAGNOSIS — M48061 Spinal stenosis, lumbar region without neurogenic claudication: Secondary | ICD-10-CM | POA: Diagnosis present

## 2018-02-10 DIAGNOSIS — M79605 Pain in left leg: Secondary | ICD-10-CM | POA: Diagnosis present

## 2018-02-10 DIAGNOSIS — M961 Postlaminectomy syndrome, not elsewhere classified: Secondary | ICD-10-CM | POA: Diagnosis present

## 2018-02-10 DIAGNOSIS — G8929 Other chronic pain: Secondary | ICD-10-CM | POA: Insufficient documentation

## 2018-02-10 DIAGNOSIS — M5136 Other intervertebral disc degeneration, lumbar region: Secondary | ICD-10-CM | POA: Diagnosis present

## 2018-02-10 DIAGNOSIS — M4310 Spondylolisthesis, site unspecified: Secondary | ICD-10-CM

## 2018-02-10 DIAGNOSIS — M532X6 Spinal instabilities, lumbar region: Secondary | ICD-10-CM

## 2018-02-10 MED ORDER — DEXAMETHASONE SODIUM PHOSPHATE 10 MG/ML IJ SOLN
10.0000 mg | Freq: Once | INTRAMUSCULAR | Status: AC
  Administered 2018-02-10: 10 mg

## 2018-02-10 MED ORDER — ROPIVACAINE HCL 2 MG/ML IJ SOLN
1.0000 mL | Freq: Once | INTRAMUSCULAR | Status: AC
  Administered 2018-02-10: 2 mL via EPIDURAL

## 2018-02-10 MED ORDER — DEXAMETHASONE SODIUM PHOSPHATE 10 MG/ML IJ SOLN
INTRAMUSCULAR | Status: AC
  Filled 2018-02-10: qty 2

## 2018-02-10 MED ORDER — SODIUM CHLORIDE 0.9% FLUSH: 1.0000 mL | Freq: Once | INTRAVENOUS | Status: DC

## 2018-02-10 MED ORDER — SODIUM CHLORIDE (PF) 0.9 % IJ SOLN
INTRAMUSCULAR | Status: AC
  Filled 2018-02-10: qty 10

## 2018-02-10 MED ORDER — IOPAMIDOL (ISOVUE-M 200) INJECTION 41%
INTRAMUSCULAR | Status: AC
  Filled 2018-02-10: qty 10

## 2018-02-10 MED ORDER — SODIUM CHLORIDE 0.9% FLUSH
1.0000 mL | Freq: Once | INTRAVENOUS | Status: AC
  Administered 2018-02-10: 2 mL

## 2018-02-10 MED ORDER — LIDOCAINE HCL 2 % IJ SOLN
INTRAMUSCULAR | Status: AC
  Filled 2018-02-10: qty 20

## 2018-02-10 MED ORDER — FENTANYL CITRATE (PF) 100 MCG/2ML IJ SOLN: 25.0000 ug | INTRAMUSCULAR | Status: DC | PRN

## 2018-02-10 MED ORDER — ROPIVACAINE HCL 2 MG/ML IJ SOLN: 1.0000 mL | Freq: Once | INTRAMUSCULAR | Status: DC

## 2018-02-10 MED ORDER — FENTANYL CITRATE (PF) 100 MCG/2ML IJ SOLN
INTRAMUSCULAR | Status: AC
  Filled 2018-02-10: qty 2

## 2018-02-10 MED ORDER — MIDAZOLAM HCL 5 MG/5ML IJ SOLN: 1.0000 mg | INTRAMUSCULAR | Status: DC | PRN

## 2018-02-10 MED ORDER — LACTATED RINGERS IV SOLN: 1000.0000 mL | Freq: Once | INTRAVENOUS | Status: DC

## 2018-02-10 MED ORDER — LIDOCAINE HCL 2 % IJ SOLN
20.0000 mL | Freq: Once | INTRAMUSCULAR | Status: AC
  Administered 2018-02-10: 400 mg

## 2018-02-10 MED ORDER — MIDAZOLAM HCL 5 MG/5ML IJ SOLN
INTRAMUSCULAR | Status: AC
  Filled 2018-02-10: qty 5

## 2018-02-10 MED ORDER — ROPIVACAINE HCL 2 MG/ML IJ SOLN
INTRAMUSCULAR | Status: AC
  Filled 2018-02-10: qty 10

## 2018-02-10 MED ORDER — IOPAMIDOL (ISOVUE-M 200) INJECTION 41%: 10.0000 mL | Freq: Once | INTRAMUSCULAR | Status: DC

## 2018-02-10 NOTE — Patient Instructions (Signed)

## 2018-02-10 NOTE — Progress Notes (Signed)
Safety precautions to be maintained throughout the outpatient stay will include: orient to surroundings, keep bed in low position, maintain call bell within reach at all times, provide assistance with transfer out of bed and ambulation.  

## 2018-02-10 NOTE — Progress Notes (Signed)
Patient's Name: [name]  MRN: [id number]  Referring Provider: Milinda Pointer, MD  DOB: 1945/08/13  PCP: Randel Books, FNP  DOS: 02/10/2018  Note by: Gaspar Cola, MD  Service setting: Ambulatory outpatient  Specialty: Interventional Pain Management  Patient type: Established  Location: [facility] (AMB) Pain Management Facility  Visit type: Interventional Procedure   Primary Reason for Visit: Interventional Pain Management Treatment. CC: Back Pain (left, lower)  Procedure:          Anesthesia, Analgesia, Anxiolysis:  Type: Trans-Foraminal Epidural Steroid Injection #2  Purpose: Diagnostic/Therapeutic Region: Posterolateral Lumbosacral Target Area: The 6 o'clock position under the pedicle, on the affected side. Approach: Posterior Percutaneous Paravertebral approach. Level: L4 & L5 Level Laterality: Left-Sided Paravertebral  Type: Local Anesthesia Indication(s): Analgesia         Route: Infiltration ([place]/IM) IV Access: Declined Sedation: Declined  Local Anesthetic: Lidocaine 1-2%  Position: Prone   Indications: 1. DDD (degenerative disc disease), lumbar   2. Chronic lower extremity pain (Secondary source of pain) (Left)   3. Chronic low back pain (Primary Source of Pain) (Bilateral) (L>R)   4. Failed back surgical syndrome   5. Lumbar Anterolisthesis at L4-5 (8-11 mm w/ dynamic instability) and L5-S1 (2 mm) (Stable)   6. Lumbar foraminal stenosis (bilateral L4-5)   7. Spinal stenosis of lumbar region without neurogenic claudication   8. Lumbar spine instability (L4-5)    Pain Score: Pre-procedure: 5 /10 Post-procedure: 0-No pain/10  Pre-op Assessment:  [name] is a 73 y.o. (year old), female patient, seen today for interventional treatment. She  has a past surgical history that includes Vaginal hysterectomy (1975); Incontinence surgery (2001); Oophorectomy (1978); Elbow Arthroplasty (1990); Cholecystectomy (1989); Rectocele repair (2009); back implant  (2015); Mastectomy partial / lumpectomy (Bilateral, 1978 ); Augmentation mammaplasty (Bilateral, 1978); Colonoscopy with propofol (N/A, 01/29/2017); Spinal cord stimulator implant; and Esophagogastroduodenoscopy (egd) with propofol (N/A, 11/07/2017). Ms. Luchsinger has a current medication list which includes the following prescription(s): albuterol, alendronate, alprazolam, aspirin ec, atenolol, calcium, cetirizine, cholecalciferol, cinnamon, citalopram, conjugated estrogens, fenofibrate, fish oil-flax oil-borage oil, fluticasone-salmeterol, furosemide, gabapentin, meclizine, melatonin, vitamin k2, montelukast, omega 3-6-9 complex, oxycodone, oxycodone, ropinirole, zinc gluconate, and zolpidem, and the following Facility-Administered Medications: iopamidol, ropivacaine (pf) 2 mg/ml (0.2%), and sodium chloride flush. Her primarily concern today is the Back Pain (left, lower)  Initial Vital Signs:  Pulse/HCG Rate: (!) 59ECG Heart Rate: 61 Temp: 98.4 F (36.9 C) Resp: 16 BP: (!) 109/59 SpO2: 100 %  BMI: Estimated body mass index is 28.9 kg/m as calculated from the following:   Height as of this encounter: 5\' 2"  (1.575 m).   Weight as of this encounter: 158 lb (71.7 kg).  Risk Assessment: Allergies: Reviewed. She is allergic to aspirin; morphine; vicodin [hydrocodone-acetaminophen]; niacin and related; and penicillins.  Allergy Precautions: None required Coagulopathies: Reviewed. None identified.  Blood-thinner therapy: None at this time Active Infection(s): Reviewed. None identified. Ms. Amador is afebrile  Site Confirmation: Ms. Crosland was asked to confirm the procedure and laterality before marking the site Procedure checklist: Completed Consent: Before the procedure and under the influence of no sedative(s), amnesic(s), or anxiolytics, the patient was informed of the treatment options, risks and possible complications. To fulfill our ethical and legal obligations, as recommended by the  American Medical Association's Code of Ethics, I have informed the patient of my clinical impression; the nature and purpose of the treatment or procedure; the risks, benefits, and possible complications of the intervention; the alternatives, including doing nothing;  the risk(s) and benefit(s) of the alternative treatment(s) or procedure(s); and the risk(s) and benefit(s) of doing nothing. The patient was provided information about the general risks and possible complications associated with the procedure. These may include, but are not limited to: failure to achieve desired goals, infection, bleeding, organ or nerve damage, allergic reactions, paralysis, and death. In addition, the patient was informed of those risks and complications associated to Spine-related procedures, such as failure to decrease pain; infection (i.e.: Meningitis, epidural or intraspinal abscess); bleeding (i.e.: epidural hematoma, subarachnoid hemorrhage, or any other type of intraspinal or peri-dural bleeding); organ or nerve damage (i.e.: Any type of peripheral nerve, nerve root, or spinal cord injury) with subsequent damage to sensory, motor, and/or autonomic systems, resulting in permanent pain, numbness, and/or weakness of one or several areas of the body; allergic reactions; (i.e.: anaphylactic reaction); and/or death. Furthermore, the patient was informed of those risks and complications associated with the medications. These include, but are not limited to: allergic reactions (i.e.: anaphylactic or anaphylactoid reaction(s)); adrenal axis suppression; blood sugar elevation that in diabetics may result in ketoacidosis or comma; water retention that in patients with history of congestive heart failure may result in shortness of breath, pulmonary edema, and decompensation with resultant heart failure; weight gain; swelling or edema; medication-induced neural toxicity; particulate matter embolism and blood vessel occlusion with  resultant organ, and/or nervous system infarction; and/or aseptic necrosis of one or more joints. Finally, the patient was informed that Medicine is not an exact science; therefore, there is also the possibility of unforeseen or unpredictable risks and/or possible complications that may result in a catastrophic outcome. The patient indicated having understood very clearly. We have given the patient no guarantees and we have made no promises. Enough time was given to the patient to ask questions, all of which were answered to the patient's satisfaction. Ms. Rooke has indicated that she wanted to continue with the procedure. Attestation: I, the ordering provider, attest that I have discussed with the patient the benefits, risks, side-effects, alternatives, likelihood of achieving goals, and potential problems during recovery for the procedure that I have provided informed consent. Date  Time: 02/10/2018  8:08 AM  Pre-Procedure Preparation:  Monitoring: As per clinic protocol. Respiration, ETCO2, SpO2, BP, heart rate and rhythm monitor placed and checked for adequate function Safety Precautions: Patient was assessed for positional comfort and pressure points before starting the procedure. Time-out: I initiated and conducted the "Time-out" before starting the procedure, as per protocol. The patient was asked to participate by confirming the accuracy of the "Time Out" information. Verification of the correct person, site, and procedure were performed and confirmed by me, the nursing staff, and the patient. "Time-out" conducted as per Joint Commission's Universal Protocol (UP.01.01.01). Time: 0847  Description of Procedure:          Area Prepped: Entire Posterior Lumbosacral Area Prepping solution: ChloraPrep (2% chlorhexidine gluconate and 70% isopropyl alcohol) Safety Precautions: Aspiration looking for blood return was conducted prior to all injections. At no point did we inject any substances, as a  needle was being advanced. No attempts were made at seeking any paresthesias. Safe injection practices and needle disposal techniques used. Medications properly checked for expiration dates. SDV (single dose vial) medications used. Description of the Procedure: Protocol guidelines were followed. The patient was placed in position over the procedure table. The target area was identified and the area prepped in the usual manner. Skin & deeper tissues infiltrated with local anesthetic. Appropriate amount of  time allowed to pass for local anesthetics to take effect. The procedure needles were then advanced to the target area. Proper needle placement secured. Negative aspiration confirmed. Solution injected in intermittent fashion, asking for systemic symptoms every 0.5cc of injectate. The needles were then removed and the area cleansed, making sure to leave some of the prepping solution back to take advantage of its long term bactericidal properties.  Vitals:   02/10/18 0845 02/10/18 0850 02/10/18 0855 02/10/18 0901  BP: (!) 106/50 98/73 91/70  102/77  Pulse:      Resp: 16 14 16 14   Temp:      TempSrc:      SpO2: 97% 100% 98% 98%  Weight:      Height:        Start Time: 0847 hrs. End Time: 0900 hrs.  Materials:  Needle(s) Type: Spinal Needle Gauge: 22G Length: 5-in Medication(s): Please see orders for medications and dosing details.  Imaging Guidance (Spinal):          Type of Imaging Technique: Fluoroscopy Guidance (Spinal) Indication(s): Assistance in needle guidance and placement for procedures requiring needle placement in or near specific anatomical locations not easily accessible without such assistance. Exposure Time: Please see nurses notes. Contrast: Before injecting any contrast, we confirmed that the patient did not have an allergy to iodine, shellfish, or radiological contrast. Once satisfactory needle placement was completed at the desired level, radiological contrast was  injected. Contrast injected under live fluoroscopy. No contrast complications. See chart for type and volume of contrast used. Fluoroscopic Guidance: I was personally present during the use of fluoroscopy. "Tunnel Vision Technique" used to obtain the best possible view of the target area. Parallax error corrected before commencing the procedure. "Direction-depth-direction" technique used to introduce the needle under continuous pulsed fluoroscopy. Once target was reached, antero-posterior, oblique, and lateral fluoroscopic projection used confirm needle placement in all planes. Images permanently stored in EMR.          Interpretation: I personally interpreted the imaging intraoperatively. Adequate needle placement confirmed in multiple planes. Appropriate spread of contrast into desired area was observed. No evidence of afferent or efferent intravascular uptake. No intrathecal or subarachnoid spread observed. Permanent images saved into the patient's record.  Antibiotic Prophylaxis:   Anti-infectives (From admission, onward)   None     Indication(s): None identified  Post-operative Assessment:  Post-procedure Vital Signs:  Pulse/HCG Rate: (!) 5960 Temp: 98.4 F (36.9 C) Resp: 14 BP: 102/77 SpO2: 98 %  EBL: None  Complications: No immediate post-treatment complications observed by team, or reported by patient.  Note: The patient tolerated the entire procedure well. A repeat set of vitals were taken after the procedure and the patient was kept under observation following institutional policy, for this type of procedure. Post-procedural neurological assessment was performed, showing return to baseline, prior to discharge. The patient was provided with post-procedure discharge instructions, including a section on how to identify potential problems. Should any problems arise concerning this procedure, the patient was given instructions to immediately contact us, at any time, without  hesitation. In any case, we plan to contact the patient by telephone for a follow-up status report regarding this interventional procedure.  Comments:  No additional relevant information.  Plan of Care  Interventional management options: Planned, scheduled, and/or pending:  Diagnostic/Therapeutic right L2-3 LESI #2 under fluoro, no sedation    Considering:   Diagnostic bilateral intra-articular hip injection. Palliativeleftlumbar facetRFA.(Last done on: 07/10/2017 [#2]) Palliativerightlumbar facetRFA.(Last done on: 06/06/2016 [#1]) Palliative bilateral lumbar  facet block. Palliativeleft sacroiliac jointRFA.(Last done on: 07/10/2017 [#2]) Palliative L1-2 vs L4-5LESI. Palliative bilateral L4-5 TFESI. Palliative right-sided L1-2 LESI #2 + left sided L4 transforaminal ESI #3   Palliative PRN treatment(s):   Diagnostic bilateral intra-articular hip injection. Palliativeleftlumbar facetRFA.(Last done on: 07/10/2017 [#2]) Palliativerightlumbar facetRFA.(Last done on: 06/06/2016 [#1]) Palliative bilateral lumbar facet block. Palliativeleft sacroiliac joint block. Palliativeleft sacroiliac jointRFA.(Last done on: 07/10/2017 [#2]) Palliative L1-2 vs L4-5LESI. Palliative bilateral L4-5 TFESI.    Imaging Orders     DG C-Arm 1-60 Min-No Report  Procedure Orders     Lumbar Transforaminal Epidural  Medications ordered for procedure: Meds ordered this encounter  Medications  . lidocaine (XYLOCAINE) 2 % (with pres) injection 400 mg  . DISCONTD: midazolam (VERSED) 5 MG/5ML injection 1-2 mg    Make sure Flumazenil is available in the pyxis when using this medication. If oversedation occurs, administer 0.2 mg IV over 15 sec. If after 45 sec no response, administer 0.2 mg again over 1 min; may repeat at 1 min intervals; not to exceed 4 doses (1 mg)  . DISCONTD: fentaNYL (SUBLIMAZE) injection 25-50 mcg    Make sure Narcan is available in the pyxis when using this  medication. In the event of respiratory depression (RR< 8/min): Titrate NARCAN (naloxone) in increments of 0.1 to 0.2 mg IV at 2-3 minute intervals, until desired degree of reversal.  . DISCONTD: lactated ringers infusion 1,000 mL  . sodium chloride flush (NS) 0.9 % injection 1 mL  . ropivacaine (PF) 2 mg/mL (0.2%) (NAROPIN) injection 1 mL  . dexamethasone (DECADRON) injection 10 mg  . sodium chloride flush (NS) 0.9 % injection 1 mL  . ropivacaine (PF) 2 mg/mL (0.2%) (NAROPIN) injection 1 mL  . dexamethasone (DECADRON) injection 10 mg  . iopamidol (ISOVUE-M) 41 % intrathecal injection 10 mL    Must be Myelogram-compatible. If not available, you may substitute with a water-soluble, non-ionic, hypoallergenic, myelogram-compatible radiological contrast medium.   Medications administered: We administered lidocaine, ropivacaine (PF) 2 mg/mL (0.2%), dexamethasone, sodium chloride flush, and dexamethasone.  See the medical record for exact dosing, route, and time of administration.  Disposition: Discharge home  Discharge Date & Time: 02/10/2018; 0909 hrs.   Physician-requested Follow-up: Return for post-procedure eval (2 wks), w/ Dionisio David, NP.  Future Appointments  Date Time Provider [place]  02/24/2018  8:00 AM Vevelyn Francois, NP [facility] None  04/23/2018  8:00 AM Vevelyn Francois, NP [facility] None   Primary Care Physician: Randel Books, FNP Location: [facility] Management Facility Note by: Gaspar Cola, MD Date: 02/10/2018; Time: 9:16 AM  Disclaimer:  Medicine is not an exact science. The only guarantee in medicine is that nothing is guaranteed. It is important to note that the decision to proceed with this intervention was based on the information collected from the patient. The Data and conclusions were drawn from the patient's questionnaire, the interview, and the physical examination. Because the information was provided in large part by the patient,  it cannot be guaranteed that it has not been purposely or unconsciously manipulated. Every effort has been made to obtain as much relevant data as possible for this evaluation. It is important to note that the conclusions that lead to this procedure are derived in large part from the available data. Always take into account that the treatment will also be dependent on availability of resources and existing treatment guidelines, considered by other Pain Management Practitioners as being common knowledge and practice, at the  time of the intervention. For Medico-Legal purposes, it is also important to point out that variation in procedural techniques and pharmacological choices are the acceptable norm. The indications, contraindications, technique, and results of the above procedure should only be interpreted and judged by a Board-Certified Interventional Pain Specialist with extensive familiarity and expertise in the same exact procedure and technique.

## 2018-02-11 ENCOUNTER — Telehealth: Payer: Self-pay | Admitting: *Deleted

## 2018-02-11 NOTE — Telephone Encounter (Signed)
No problems post procedure. 

## 2018-02-17 ENCOUNTER — Telehealth: Payer: Self-pay | Admitting: Nurse Practitioner

## 2018-02-17 ENCOUNTER — Other Ambulatory Visit: Payer: Self-pay | Admitting: Nurse Practitioner

## 2018-02-17 DIAGNOSIS — M62830 Muscle spasm of back: Secondary | ICD-10-CM

## 2018-02-17 DIAGNOSIS — M7918 Myalgia, other site: Secondary | ICD-10-CM

## 2018-02-17 NOTE — Telephone Encounter (Signed)
Patient calling to see if you could send in her Tizanidine .

## 2018-02-18 MED ORDER — TIZANIDINE HCL 4 MG PO TABS: 4.0000 mg | ORAL_TABLET | Freq: Three times a day (TID) | ORAL | 2 refills | Status: DC

## 2018-02-23 ENCOUNTER — Telehealth: Payer: Self-pay | Admitting: *Deleted

## 2018-02-23 ENCOUNTER — Other Ambulatory Visit: Payer: Self-pay

## 2018-02-23 NOTE — Telephone Encounter (Signed)
Called Dinna back and gave her the numbers for [facility] orthopedic dept.  She wants me to be sure and tell Dr Dossie Arbour what has happened.

## 2018-02-23 NOTE — Telephone Encounter (Signed)
She can always call [facility] and see what they have.

## 2018-02-23 NOTE — Telephone Encounter (Signed)
If she broke her leg. She needs to follow up with Ortho as directed for treatment of the fracture. She does not have to accept a referral to [facility] when she is being treated here. I am sure it was just a misunderstanding.

## 2018-02-23 NOTE — Telephone Encounter (Signed)
Spoke to [place], states she went to the ED in [facility] because that was the closest when she fell.  That is probably why she was referred to [facility] ortho.  She called to cancel Neurosurgeon appt and they referred her to [facility] and they are unable to see her until Wednesday.  She does have a splint on the leg and is using ice. She was also given some oxycodone for pain.  I told her I would speak to our providers to see if they have any other advise.  The Dr Kennis Carina that she saw at [facility] told her that as long as she had it looked at this week she would be fine.

## 2018-02-24 ENCOUNTER — Ambulatory Visit: Payer: Medicare Other | Admitting: Nurse Practitioner

## 2018-04-07 ENCOUNTER — Encounter: Payer: Self-pay | Admitting: Nurse Practitioner

## 2018-04-07 ENCOUNTER — Other Ambulatory Visit: Payer: Self-pay

## 2018-04-07 ENCOUNTER — Ambulatory Visit: Payer: Medicare Other | Attending: Nurse Practitioner | Admitting: Nurse Practitioner

## 2018-04-07 VITALS — BP 170/72 | HR 65 | Temp 98.0°F | Resp 16 | Ht 61.0 in | Wt 158.0 lb

## 2018-04-07 DIAGNOSIS — G894 Chronic pain syndrome: Secondary | ICD-10-CM | POA: Insufficient documentation

## 2018-04-07 DIAGNOSIS — M62830 Muscle spasm of back: Secondary | ICD-10-CM | POA: Insufficient documentation

## 2018-04-07 DIAGNOSIS — M7918 Myalgia, other site: Secondary | ICD-10-CM | POA: Insufficient documentation

## 2018-04-07 DIAGNOSIS — M797 Fibromyalgia: Secondary | ICD-10-CM | POA: Insufficient documentation

## 2018-04-07 DIAGNOSIS — M47817 Spondylosis without myelopathy or radiculopathy, lumbosacral region: Secondary | ICD-10-CM | POA: Insufficient documentation

## 2018-04-07 DIAGNOSIS — M25551 Pain in right hip: Secondary | ICD-10-CM | POA: Diagnosis present

## 2018-04-07 DIAGNOSIS — M25561 Pain in right knee: Secondary | ICD-10-CM | POA: Diagnosis present

## 2018-04-07 DIAGNOSIS — M792 Neuralgia and neuritis, unspecified: Secondary | ICD-10-CM | POA: Diagnosis present

## 2018-04-07 DIAGNOSIS — G8929 Other chronic pain: Secondary | ICD-10-CM | POA: Diagnosis present

## 2018-04-07 MED ORDER — OXYCODONE HCL 5 MG PO TABS: 5.0000 mg | ORAL_TABLET | Freq: Four times a day (QID) | ORAL | 0 refills | Status: DC | PRN

## 2018-04-07 MED ORDER — GABAPENTIN 300 MG PO CAPS: 300.0000 mg | ORAL_CAPSULE | Freq: Four times a day (QID) | ORAL | 2 refills | Status: DC

## 2018-04-07 MED ORDER — NALOXONE HCL 4 MG/0.1ML NA LIQD: NASAL | 0 refills | Status: DC

## 2018-04-07 MED ORDER — TIZANIDINE HCL 4 MG PO TABS: 4.0000 mg | ORAL_TABLET | Freq: Three times a day (TID) | ORAL | 0 refills | Status: DC
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
## 2018-04-07 NOTE — Patient Instructions (Signed)
____________________________________________________________________________________________  Medication Rules  Purpose: To inform patients, and their family members, of our rules and regulations.  Applies to: All patients receiving prescriptions (written or electronic).  Pharmacy of record: Pharmacy where electronic prescriptions will be sent. If written prescriptions are taken to a different pharmacy, please inform the nursing staff. The pharmacy listed in the electronic medical record should be the one where you would like electronic prescriptions to be sent.  Electronic prescriptions: In compliance with the [place] Strengthen Opioid Misuse Prevention (STOP) Act of 2017 (Session Law 2017-74/H243), effective January 28, 2018, all controlled substances must be electronically prescribed. Calling prescriptions to the pharmacy will cease to exist.  Prescription refills: Only during scheduled appointments. Applies to all prescriptions.  NOTE: The following applies primarily to controlled substances (Opioid* Pain Medications).   Patient's responsibilities: 1. Pain Pills: Bring all pain pills to every appointment (except for procedure appointments). 2. Pill Bottles: Bring pills in original pharmacy bottle. Always bring the newest bottle. Bring bottle, even if empty. 3. Medication refills: You are responsible for knowing and keeping track of what medications you take and those you need refilled. The day before your appointment: write a list of all prescriptions that need to be refilled. The day of the appointment: give the list to the admitting nurse. Prescriptions will be written only during appointments. No prescriptions will be written on procedure days. If you forget a medication: it will not be "Called in", "Faxed", or "electronically sent". You will need to get another appointment to get these prescribed. No early refills. Do not call asking to have your prescription filled  early. 4. Prescription Accuracy: You are responsible for carefully inspecting your prescriptions before leaving our office. Have the discharge nurse carefully go over each prescription with you, before taking them home. Make sure that your name is accurately spelled, that your address is correct. Check the name and dose of your medication to make sure it is accurate. Check the number of pills, and the written instructions to make sure they are clear and accurate. Make sure that you are given enough medication to last until your next medication refill appointment. 5. Taking Medication: Take medication as prescribed. When it comes to controlled substances, taking less pills or less frequently than prescribed is permitted and encouraged. Never take more pills than instructed. Never take medication more frequently than prescribed.  6. Inform other Doctors: Always inform, all of your healthcare providers, of all the medications you take. 7. Pain Medication from other Providers: You are not allowed to accept any additional pain medication from any other Doctor or Healthcare provider. There are two exceptions to this rule. (see below) In the event that you require additional pain medication, you are responsible for notifying us, as stated below. 8. Medication Agreement: You are responsible for carefully reading and following our Medication Agreement. This must be signed before receiving any prescriptions from our practice. Safely store a copy of your signed Agreement. Violations to the Agreement will result in no further prescriptions. (Additional copies of our Medication Agreement are available upon request.) 9. Laws, Rules, & Regulations: All patients are expected to follow all Federal and State Laws, Statutes, Rules, & Regulations. Ignorance of the Laws does not constitute a valid excuse. The use of any illegal substances is prohibited. 10. Adopted CDC guidelines & recommendations: Target dosing levels will be  at or below 60 MME/day. Use of benzodiazepines** is not recommended.  Exceptions: There are only two exceptions to the rule of not   receiving pain medications from other Healthcare Providers. 1. Exception #1 (Emergencies): In the event of an emergency (i.e.: accident requiring emergency care), you are allowed to receive additional pain medication. However, you are responsible for: As soon as you are able, call our office [phone], at any time of the day or night, and leave a message stating your name, the date and nature of the emergency, and the name and dose of the medication prescribed. In the event that your call is answered by a member of our staff, make sure to document and save the date, time, and the name of the person that took your information.  2. Exception #2 (Planned Surgery): In the event that you are scheduled by another doctor or dentist to have any type of surgery or procedure, you are allowed (for a period no longer than 30 days), to receive additional pain medication, for the acute post-op pain. However, in this case, you are responsible for picking up a copy of our "Post-op Pain Management for Surgeons" handout, and giving it to your surgeon or dentist. This document is available at our office, and does not require an appointment to obtain it. Simply go to our office during business hours (Monday-Thursday from 8:00 AM to 4:00 PM) (Friday 8:00 AM to 12:00 Noon) or if you have a scheduled appointment with us, prior to your surgery, and ask for it by name. In addition, you will need to provide us with your name, name of your surgeon, type of surgery, and date of procedure or surgery.  *Opioid medications include: morphine, codeine, oxycodone, oxymorphone, hydrocodone, hydromorphone, meperidine, tramadol, tapentadol, buprenorphine, fentanyl, methadone. **Benzodiazepine medications include: diazepam (Valium), alprazolam (Xanax), clonazepam (Klonopine), lorazepam (Ativan), clorazepate  (Tranxene), chlordiazepoxide (Librium), estazolam (Prosom), oxazepam (Serax), temazepam (Restoril), triazolam (Halcion) (Last updated: 03/27/2017) ____________________________________________________________________________________________    

## 2018-04-07 NOTE — Progress Notes (Signed)
Nursing Pain Medication Assessment:  Safety precautions to be maintained throughout the outpatient stay will include: orient to surroundings, keep bed in low position, maintain call bell within reach at all times, provide assistance with transfer out of bed and ambulation.  Medication Inspection Compliance: Pill count conducted under aseptic conditions, in front of the patient. Neither the pills nor the bottle was removed from the patient's sight at any time. Once count was completed pills were immediately returned to the patient in their original bottle.  Medication: Oxycodone IR Pill/Patch Count: 73 of 100 pills remain Pill/Patch Appearance: Markings consistent with prescribed medication Bottle Appearance: Standard pharmacy container. Clearly labeled. Filled Date: 2 / 27 / 2020 Last Medication intake:  Today

## 2018-04-23 ENCOUNTER — Other Ambulatory Visit: Payer: Self-pay | Admitting: Family Medicine

## 2018-04-23 ENCOUNTER — Encounter: Payer: Medicare Other | Admitting: Nurse Practitioner

## 2018-04-23 DIAGNOSIS — Z1231 Encounter for screening mammogram for malignant neoplasm of breast: Secondary | ICD-10-CM

## 2018-04-24 ENCOUNTER — Telehealth: Payer: Self-pay | Admitting: Pain Medicine

## 2018-04-24 NOTE — Telephone Encounter (Signed)
Spoke with patient and she states that she is having pain in her low back and it radiates into her left buttock then down the back of her left leg to knee.  States she has not had this pain in over a year so she was unable to determine which procedure worked for her.  There are no PRN procedures ordered at this time. Will speak to Dr Dossie Arbour when he returns next week.

## 2018-04-24 NOTE — Telephone Encounter (Signed)
Patient called today asking to switch tizanidine to flexaril as the tizanidine is not helping anymore. Also wants to get injections bilateral lower back. Please call to determine what procedure.

## 2018-04-27 NOTE — Telephone Encounter (Signed)
Spoke with Dr. Dossie Arbour, advised that he prefers not to do procedures at this time unless patient feels it is an emergency. Patient notified, she states she does not feel it is emergent, will call us at a later time to determine if we are doing procedures.

## 2018-06-07 ENCOUNTER — Other Ambulatory Visit: Payer: Self-pay | Admitting: Nurse Practitioner

## 2018-06-07 DIAGNOSIS — M797 Fibromyalgia: Secondary | ICD-10-CM

## 2018-06-07 DIAGNOSIS — M7918 Myalgia, other site: Secondary | ICD-10-CM

## 2018-06-07 DIAGNOSIS — M62830 Muscle spasm of back: Secondary | ICD-10-CM

## 2018-06-07 DIAGNOSIS — M792 Neuralgia and neuritis, unspecified: Secondary | ICD-10-CM

## 2018-07-02 ENCOUNTER — Encounter: Payer: Self-pay | Admitting: Pain Medicine

## 2018-07-05 DIAGNOSIS — Z789 Other specified health status: Secondary | ICD-10-CM | POA: Insufficient documentation

## 2018-07-05 DIAGNOSIS — Z79899 Other long term (current) drug therapy: Secondary | ICD-10-CM | POA: Insufficient documentation

## 2018-07-05 DIAGNOSIS — M899 Disorder of bone, unspecified: Secondary | ICD-10-CM | POA: Insufficient documentation
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
## 2018-07-06 ENCOUNTER — Ambulatory Visit: Payer: Medicare Other | Attending: Nurse Practitioner | Admitting: Pain Medicine

## 2018-07-06 ENCOUNTER — Other Ambulatory Visit: Payer: Self-pay

## 2018-07-06 ENCOUNTER — Other Ambulatory Visit: Payer: Self-pay | Admitting: Pain Medicine

## 2018-07-06 ENCOUNTER — Encounter: Payer: PRIVATE HEALTH INSURANCE | Admitting: Nurse Practitioner

## 2018-07-06 DIAGNOSIS — M792 Neuralgia and neuritis, unspecified: Secondary | ICD-10-CM

## 2018-07-06 DIAGNOSIS — M797 Fibromyalgia: Secondary | ICD-10-CM

## 2018-07-06 DIAGNOSIS — M961 Postlaminectomy syndrome, not elsewhere classified: Secondary | ICD-10-CM | POA: Diagnosis not present

## 2018-07-06 DIAGNOSIS — M7918 Myalgia, other site: Secondary | ICD-10-CM

## 2018-07-06 DIAGNOSIS — M62830 Muscle spasm of back: Secondary | ICD-10-CM

## 2018-07-06 DIAGNOSIS — M545 Low back pain: Secondary | ICD-10-CM

## 2018-07-06 DIAGNOSIS — Z789 Other specified health status: Secondary | ICD-10-CM

## 2018-07-06 DIAGNOSIS — G894 Chronic pain syndrome: Secondary | ICD-10-CM | POA: Diagnosis not present

## 2018-07-06 DIAGNOSIS — Z79899 Other long term (current) drug therapy: Secondary | ICD-10-CM

## 2018-07-06 DIAGNOSIS — Z79891 Long term (current) use of opiate analgesic: Secondary | ICD-10-CM

## 2018-07-06 DIAGNOSIS — M79605 Pain in left leg: Secondary | ICD-10-CM

## 2018-07-06 DIAGNOSIS — M899 Disorder of bone, unspecified: Secondary | ICD-10-CM

## 2018-07-06 DIAGNOSIS — G8929 Other chronic pain: Secondary | ICD-10-CM

## 2018-07-06 DIAGNOSIS — M47816 Spondylosis without myelopathy or radiculopathy, lumbar region: Secondary | ICD-10-CM

## 2018-07-06 DIAGNOSIS — Z01818 Encounter for other preprocedural examination: Secondary | ICD-10-CM

## 2018-07-06 MED ORDER — OXYCODONE HCL 5 MG PO TABS: 5.0000 mg | ORAL_TABLET | Freq: Four times a day (QID) | ORAL | 0 refills | Status: DC | PRN

## 2018-07-06 MED ORDER — NALOXONE HCL 4 MG/0.1ML NA LIQD: NASAL | 0 refills | Status: DC

## 2018-07-06 MED ORDER — CYCLOBENZAPRINE HCL 10 MG PO TABS: 10.0000 mg | ORAL_TABLET | Freq: Three times a day (TID) | ORAL | 2 refills | Status: DC | PRN

## 2018-07-06 MED ORDER — GABAPENTIN 300 MG PO CAPS: ORAL_CAPSULE | ORAL | 2 refills | Status: DC

## 2018-07-06 NOTE — Patient Instructions (Signed)
____________________________________________________________________________________________  Preparing for Procedure with Sedation  Procedure appointments are limited to planned procedures: . No Prescription Refills. . No disability issues will be discussed. . No medication changes will be discussed.  Instructions: . Oral Intake: Do not eat or drink anything for at least 8 hours prior to your procedure. . Transportation: Public transportation is not allowed. Bring an adult driver. The driver must be physically present in our waiting room before any procedure can be started. . Physical Assistance: Bring an adult physically capable of assisting you, in the event you need help. This adult should keep you company at home for at least 6 hours after the procedure. . Blood Pressure Medicine: Take your blood pressure medicine with a sip of water the morning of the procedure. . Blood thinners: Notify our staff if you are taking any blood thinners. Depending on which one you take, there will be specific instructions on how and when to stop it. . Diabetics on insulin: Notify the staff so that you can be scheduled 1st case in the morning. If your diabetes requires high dose insulin, take only  of your normal insulin dose the morning of the procedure and notify the staff that you have done so. . Preventing infections: Shower with an antibacterial soap the morning of your procedure. . Build-up your immune system: Take 1000 mg of Vitamin C with every meal (3 times a day) the day prior to your procedure. . Antibiotics: Inform the staff if you have a condition or reason that requires you to take antibiotics before dental procedures. . Pregnancy: If you are pregnant, call and cancel the procedure. . Sickness: If you have a cold, fever, or any active infections, call and cancel the procedure. . Arrival: You must be in the facility at least 30 minutes prior to your scheduled procedure. . Children: Do not bring  children with you. . Dress appropriately: Bring dark clothing that you would not mind if they get stained. . Valuables: Do not bring any jewelry or valuables.  Reasons to call and reschedule or cancel your procedure: (Following these recommendations will minimize the risk of a serious complication.) . Surgeries: Avoid having procedures within 2 weeks of any surgery. (Avoid for 2 weeks before or after any surgery). . Flu Shots: Avoid having procedures within 2 weeks of a flu shots or . (Avoid for 2 weeks before or after immunizations). . Barium: Avoid having a procedure within 7-10 days after having had a radiological study involving the use of radiological contrast. (Myelograms, Barium swallow or enema study). . Heart attacks: Avoid any elective procedures or surgeries for the initial 6 months after a "Myocardial Infarction" (Heart Attack). . Blood thinners: It is imperative that you stop these medications before procedures. Let us know if you if you take any blood thinner.  . Infection: Avoid procedures during or within two weeks of an infection (including chest colds or gastrointestinal problems). Symptoms associated with infections include: Localized redness, fever, chills, night sweats or profuse sweating, burning sensation when voiding, cough, congestion, stuffiness, runny nose, sore throat, diarrhea, nausea, vomiting, cold or Flu symptoms, recent or current infections. It is specially important if the infection is over the area that we intend to treat. . Heart and lung problems: Symptoms that may suggest an active cardiopulmonary problem include: cough, chest pain, breathing difficulties or shortness of breath, dizziness, ankle swelling, uncontrolled high or unusually low blood pressure, and/or palpitations. If you are experiencing any of these symptoms, cancel your procedure and contact   your primary care physician for an evaluation.  Remember:  Regular Business hours are:  Monday to Thursday  8:00 AM to 4:00 PM  Provider's Schedule: Milinda Pointer, MD:  Procedure days: Tuesday and Thursday 7:30 AM to 4:00 PM  Gillis Santa, MD:  Procedure days: Monday and Wednesday 7:30 AM to 4:00 PM ____________________________________________________________________________________________   ____________________________________________________________________________________________  Medication Rules  Purpose: To inform patients, and their family members, of our rules and regulations.  Applies to: All patients receiving prescriptions (written or electronic).  Pharmacy of record: Pharmacy where electronic prescriptions will be sent. If written prescriptions are taken to a different pharmacy, please inform the nursing staff. The pharmacy listed in the electronic medical record should be the one where you would like electronic prescriptions to be sent.  Electronic prescriptions: In compliance with the [place] (STOP) Act of 2017 (Session Lanny Cramp [phone]), effective January 28, 2018, all controlled substances must be electronically prescribed. Calling prescriptions to the pharmacy will cease to exist.  Prescription refills: Only during scheduled appointments. Applies to all prescriptions.  NOTE: The following applies primarily to controlled substances (Opioid* Pain Medications).   Patient's responsibilities: 1. Pain Pills: Bring all pain pills to every appointment (except for procedure appointments). 2. Pill Bottles: Bring pills in original pharmacy bottle. Always bring the newest bottle. Bring bottle, even if empty. 3. Medication refills: You are responsible for knowing and keeping track of what medications you take and those you need refilled. The day before your appointment: write a list of all prescriptions that need to be refilled. The day of the appointment: give the list to the admitting nurse. Prescriptions will be written only during  appointments. No prescriptions will be written on procedure days. If you forget a medication: it will not be "Called in", "Faxed", or "electronically sent". You will need to get another appointment to get these prescribed. No early refills. Do not call asking to have your prescription filled early. 4. Prescription Accuracy: You are responsible for carefully inspecting your prescriptions before leaving our office. Have the discharge nurse carefully go over each prescription with you, before taking them home. Make sure that your name is accurately spelled, that your address is correct. Check the name and dose of your medication to make sure it is accurate. Check the number of pills, and the written instructions to make sure they are clear and accurate. Make sure that you are given enough medication to last until your next medication refill appointment. 5. Taking Medication: Take medication as prescribed. When it comes to controlled substances, taking less pills or less frequently than prescribed is permitted and encouraged. Never take more pills than instructed. Never take medication more frequently than prescribed.  6. Inform other Doctors: Always inform, all of your healthcare providers, of all the medications you take. 7. Pain Medication from other Providers: You are not allowed to accept any additional pain medication from any other Doctor or Healthcare provider. There are two exceptions to this rule. (see below) In the event that you require additional pain medication, you are responsible for notifying us, as stated below. 8. Medication Agreement: You are responsible for carefully reading and following our Medication Agreement. This must be signed before receiving any prescriptions from our practice. Safely store a copy of your signed Agreement. Violations to the Agreement will result in no further prescriptions. (Additional copies of our Medication Agreement are available upon request.) 9. Laws, Rules,  & Regulations: All patients are expected to follow all Federal  and State Laws, Statutes, Rules, & Regulations. Ignorance of the Laws does not constitute a valid excuse. The use of any illegal substances is prohibited. 10. Adopted CDC guidelines & recommendations: Target dosing levels will be at or below 60 MME/day. Use of benzodiazepines** is not recommended.  Exceptions: There are only two exceptions to the rule of not receiving pain medications from other Healthcare Providers. 1. Exception #1 (Emergencies): In the event of an emergency (i.e.: accident requiring emergency care), you are allowed to receive additional pain medication. However, you are responsible for: As soon as you are able, call our office [phone], at any time of the day or night, and leave a message stating your name, the date and nature of the emergency, and the name and dose of the medication prescribed. In the event that your call is answered by a member of our staff, make sure to document and save the date, time, and the name of the person that took your information.  2. Exception #2 (Planned Surgery): In the event that you are scheduled by another doctor or dentist to have any type of surgery or procedure, you are allowed (for a period no longer than 30 days), to receive additional pain medication, for the acute post-op pain. However, in this case, you are responsible for picking up a copy of our "Post-op Pain Management for Surgeons" handout, and giving it to your surgeon or dentist. This document is available at our office, and does not require an appointment to obtain it. Simply go to our office during business hours (Monday-Thursday from 8:00 AM to 4:00 PM) (Friday 8:00 AM to 12:00 Noon) or if you have a scheduled appointment with us, prior to your surgery, and ask for it by name. In addition, you will need to provide us with your name, name of your surgeon, type of surgery, and date of procedure or surgery.  *Opioid  medications include: morphine, codeine, oxycodone, oxymorphone, hydrocodone, hydromorphone, meperidine, tramadol, tapentadol, buprenorphine, fentanyl, methadone. **Benzodiazepine medications include: diazepam (Valium), alprazolam (Xanax), clonazepam (Klonopine), lorazepam (Ativan), clorazepate (Tranxene), chlordiazepoxide (Librium), estazolam (Prosom), oxazepam (Serax), temazepam (Restoril), triazolam (Halcion) (Last updated: 03/27/2017) ____________________________________________________________________________________________   ____________________________________________________________________________________________  Medication Recommendations and Reminders  Applies to: All patients receiving prescriptions (written and/or electronic).  Medication Rules & Regulations: These rules and regulations exist for your safety and that of others. They are not flexible and neither are we. Dismissing or ignoring them will be considered "non-compliance" with medication therapy, resulting in complete and irreversible termination of such therapy. (See document titled "Medication Rules" for more details.) In all conscience, because of safety reasons, we cannot continue providing a therapy where the patient does not follow instructions.  Pharmacy of record:   Definition: This is the pharmacy where your electronic prescriptions will be sent.   We do not endorse any particular pharmacy.  You are not restricted in your choice of pharmacy.  The pharmacy listed in the electronic medical record should be the one where you want electronic prescriptions to be sent.  If you choose to change pharmacy, simply notify our nursing staff of your choice of new pharmacy.  Recommendations:  Keep all of your pain medications in a safe place, under lock and key, even if you live alone.   After you fill your prescription, take 1 week's worth of pills and put them away in a safe place. You should keep a separate,  properly labeled bottle for this purpose. The remainder should be kept in the original bottle. Use   this as your primary supply, until it runs out. Once it's gone, then you know that you have 1 week's worth of medicine, and it is time to come in for a prescription refill. If you do this correctly, it is unlikely that you will ever run out of medicine.  To make sure that the above recommendation works, it is very important that you make sure your medication refill appointments are scheduled at least 1 week before you run out of medicine. To do this in an effective manner, make sure that you do not leave the office without scheduling your next medication management appointment. Always ask the nursing staff to show you in your prescription , when your medication will be running out. Then arrange for the receptionist to get you a return appointment, at least 7 days before you run out of medicine. Do not wait until you have 1 or 2 pills left, to come in. This is very poor planning and does not take into consideration that we may need to cancel appointments due to bad weather, sickness, or emergencies affecting our staff.  "Partial Fill": If for any reason your pharmacy does not have enough pills/tablets to completely fill or refill your prescription, do not allow for a "partial fill". You will need a separate prescription to fill the remaining amount, which we will not provide. If the reason for the partial fill is your insurance, you will need to talk to the pharmacist about payment alternatives for the remaining tablets, but again, do not accept a partial fill.  Prescription refills and/or changes in medication(s):   Prescription refills, and/or changes in dose or medication, will be conducted only during scheduled medication management appointments. (Applies to both, written and electronic prescriptions.)  No refills on procedure days. No medication will be changed or started on procedure days. No changes,  adjustments, and/or refills will be conducted on a procedure day. Doing so will interfere with the diagnostic portion of the procedure.  No phone refills. No medications will be "called into the pharmacy".  No Fax refills.  No weekend refills.  No Holliday refills.  No after hours refills.  Remember:  Business hours are:  Monday to Thursday 8:00 AM to 4:00 PM Provider's Schedule: Dionisio David, NP - Appointments are:  Medication management: Monday to Thursday 8:00 AM to 4:00 PM Milinda Pointer, MD - Appointments are:  Medication management: Monday and Wednesday 8:00 AM to 4:00 PM Procedure day: Tuesday and Thursday 7:30 AM to 4:00 PM Gillis Santa, MD - Appointments are:  Medication management: Tuesday and Thursday 8:00 AM to 4:00 PM Procedure day: Monday and Wednesday 7:30 AM to 4:00 PM (Last update: 03/27/2017) ____________________________________________________________________________________________

## 2018-07-06 NOTE — Addendum Note (Signed)
Addended by: Morley Kos on: 07/06/2018 11:20 AM   Modules accepted: Orders, SmartSet

## 2018-07-06 NOTE — Addendum Note (Signed)
Addended by: Milinda Pointer A on: 07/06/2018 11:22 AM   Modules accepted: Orders, SmartSet

## 2018-07-08 ENCOUNTER — Other Ambulatory Visit: Admission: RE | Admit: 2018-07-08 | Payer: Medicare Other | Source: Ambulatory Visit

## 2018-07-13 ENCOUNTER — Other Ambulatory Visit: Payer: Self-pay

## 2018-07-13 ENCOUNTER — Other Ambulatory Visit
Admission: RE | Admit: 2018-07-13 | Discharge: 2018-07-13 | Disposition: A | Discharge status: Home / Self Care | Payer: Medicare Other | Source: Ambulatory Visit | Attending: Pain Medicine | Admitting: Pain Medicine

## 2018-07-13 DIAGNOSIS — Z1159 Encounter for screening for other viral diseases: Secondary | ICD-10-CM | POA: Insufficient documentation

## 2018-07-14 LAB — NOVEL CORONAVIRUS, NAA (HOSP ORDER, SEND-OUT TO REF LAB; TAT 18-24 HRS): SARS-CoV-2, NAA: NOT DETECTED

## 2018-07-15 NOTE — Patient Instructions (Signed)

## 2018-07-15 NOTE — Progress Notes (Signed)
Patient's Name: [name]  MRN: [id number]  Referring Provider: Milinda Pointer, MD  DOB: 1945-05-19  PCP: Randel Books, FNP  DOS: 07/16/2018  Note by: Gaspar Cola, MD  Service setting: Ambulatory outpatient  Specialty: Interventional Pain Management  Patient type: Established  Location: [facility] (AMB) Pain Management Facility  Visit type: Interventional Procedure   Primary Reason for Visit: Interventional Pain Management Treatment. CC: Back Pain (low back)  Procedure:          Anesthesia, Analgesia, Anxiolysis:  Type: Lumbar Facet, Medial Branch Block(s)          Primary Purpose: Palliative Region: Posterolateral Lumbosacral Spine Level: L2, L3, L4, L5, & S1 Medial Branch Level(s). Injecting these levels blocks the L3-4, L4-5, and L5-S1 lumbar facet joints. Laterality: Bilateral  Type: Moderate (Conscious) Sedation combined with Local Anesthesia Indication(s): Analgesia and Anxiety Route: Intravenous (IV) IV Access: Secured Sedation: Meaningful verbal contact was maintained at all times during the procedure  Local Anesthetic: Lidocaine 1-2%  Position: Prone   Indications: 1. Lumbar facet syndrome (Bilateral) (L>R)   2. Spondylosis without myelopathy or radiculopathy, lumbosacral region   3. DDD (degenerative disc disease), lumbar   4. Lumbar facet arthropathy ([place])   5. Chronic low back pain (Primary Source of Pain) (Bilateral) (L>R)    Pain Score: Pre-procedure: 5 /10 Post-procedure: 0-No pain/10  Pre-op Assessment:  Ms. Selner is a 73 y.o. (year old), female patient, seen today for interventional treatment. She  has a past surgical history that includes Vaginal hysterectomy (1975); Incontinence surgery (2001); Oophorectomy (1978); Elbow Arthroplasty (1990); Cholecystectomy (1989); Rectocele repair (2009); back implant (2015); Mastectomy partial / lumpectomy (Bilateral, 1978 ); Augmentation mammaplasty (Bilateral, 1978); Colonoscopy with propofol (N/A,  01/29/2017); Spinal cord stimulator implant; Esophagogastroduodenoscopy (egd) with propofol (N/A, 11/07/2017); and Knee surgery. Ms. Infantino has a current medication list which includes the following prescription(s): albuterol, alendronate, alprazolam, aspirin ec, atenolol, calcium, cetirizine, cholecalciferol, cinnamon, citalopram, conjugated estrogens, cyclobenzaprine, fenofibrate, fish oil-flax oil-borage oil, fluticasone-salmeterol, furosemide, gabapentin, meclizine, melatonin, vitamin k2, montelukast, naloxone, omega 3-6-9 complex, omeprazole, oxycodone, oxycodone, oxycodone, ropinirole, zinc gluconate, and zolpidem, and the following Facility-Administered Medications: fentanyl and midazolam. Her primarily concern today is the Back Pain (low back)  Initial Vital Signs:  Pulse/HCG Rate: 60ECG Heart Rate: 61 Temp: 97.9 F (36.6 C) Resp: 16 BP: (!) 135/108 SpO2: 98 %  BMI: Estimated body mass index is 31.74 kg/m as calculated from the following:   Height as of this encounter: 5\' 1"  (1.549 m).   Weight as of this encounter: 168 lb (76.2 kg).  Risk Assessment: Allergies: Reviewed. She is allergic to aspirin; morphine; vicodin [hydrocodone-acetaminophen]; niacin and related; and penicillins.  Allergy Precautions: None required Coagulopathies: Reviewed. None identified.  Blood-thinner therapy: None at this time Active Infection(s): Reviewed. None identified. Ms. Twardowski is afebrile  Site Confirmation: Ms. Mathison was asked to confirm the procedure and laterality before marking the site Procedure checklist: Completed Consent: Before the procedure and under the influence of no sedative(s), amnesic(s), or anxiolytics, the patient was informed of the treatment options, risks and possible complications. To fulfill our ethical and legal obligations, as recommended by the American Medical Association's Code of Ethics, I have informed the patient of my clinical impression; the nature and purpose of  the treatment or procedure; the risks, benefits, and possible complications of the intervention; the alternatives, including doing nothing; the risk(s) and benefit(s) of the alternative treatment(s) or procedure(s); and the risk(s) and benefit(s) of doing nothing. The patient was provided  information about the general risks and possible complications associated with the procedure. These may include, but are not limited to: failure to achieve desired goals, infection, bleeding, organ or nerve damage, allergic reactions, paralysis, and death. In addition, the patient was informed of those risks and complications associated to Spine-related procedures, such as failure to decrease pain; infection (i.e.: Meningitis, epidural or intraspinal abscess); bleeding (i.e.: epidural hematoma, subarachnoid hemorrhage, or any other type of intraspinal or peri-dural bleeding); organ or nerve damage (i.e.: Any type of peripheral nerve, nerve root, or spinal cord injury) with subsequent damage to sensory, motor, and/or autonomic systems, resulting in permanent pain, numbness, and/or weakness of one or several areas of the body; allergic reactions; (i.e.: anaphylactic reaction); and/or death. Furthermore, the patient was informed of those risks and complications associated with the medications. These include, but are not limited to: allergic reactions (i.e.: anaphylactic or anaphylactoid reaction(s)); adrenal axis suppression; blood sugar elevation that in diabetics may result in ketoacidosis or comma; water retention that in patients with history of congestive heart failure may result in shortness of breath, pulmonary edema, and decompensation with resultant heart failure; weight gain; swelling or edema; medication-induced neural toxicity; particulate matter embolism and blood vessel occlusion with resultant organ, and/or nervous system infarction; and/or aseptic necrosis of one or more joints. Finally, the patient was informed  that Medicine is not an exact science; therefore, there is also the possibility of unforeseen or unpredictable risks and/or possible complications that may result in a catastrophic outcome. The patient indicated having understood very clearly. We have given the patient no guarantees and we have made no promises. Enough time was given to the patient to ask questions, all of which were answered to the patient's satisfaction. Ms. Bellucci has indicated that she wanted to continue with the procedure. Attestation: I, the ordering provider, attest that I have discussed with the patient the benefits, risks, side-effects, alternatives, likelihood of achieving goals, and potential problems during recovery for the procedure that I have provided informed consent. Date   Time: 07/16/2018  8:32 AM  Pre-Procedure Preparation:  Monitoring: As per clinic protocol. Respiration, ETCO2, SpO2, BP, heart rate and rhythm monitor placed and checked for adequate function Safety Precautions: Patient was assessed for positional comfort and pressure points before starting the procedure. Time-out: I initiated and conducted the "Time-out" before starting the procedure, as per protocol. The patient was asked to participate by confirming the accuracy of the "Time Out" information. Verification of the correct person, site, and procedure were performed and confirmed by me, the nursing staff, and the patient. "Time-out" conducted as per Joint Commission's Universal Protocol (UP.01.01.01). Time: 0913  Description of Procedure:          Laterality: Bilateral. The procedure was performed in identical fashion on both sides. Levels:  L2, L3, L4, L5, & S1 Medial Branch Level(s) Area Prepped: Posterior Lumbosacral Region Prepping solution: DuraPrep (Iodine Povacrylex [0.7% available iodine] and Isopropyl Alcohol, 74% w/w) Safety Precautions: Aspiration looking for blood return was conducted prior to all injections. At no point did we inject  any substances, as a needle was being advanced. Before injecting, the patient was told to immediately notify me if she was experiencing any new onset of "ringing in the ears, or metallic taste in the mouth". No attempts were made at seeking any paresthesias. Safe injection practices and needle disposal techniques used. Medications properly checked for expiration dates. SDV (single dose vial) medications used. After the completion of the procedure, all disposable equipment used  was discarded in the proper designated medical waste containers. Local Anesthesia: Protocol guidelines were followed. The patient was positioned over the fluoroscopy table. The area was prepped in the usual manner. The time-out was completed. The target area was identified using fluoroscopy. A 12-in long, straight, sterile hemostat was used with fluoroscopic guidance to locate the targets for each level blocked. Once located, the skin was marked with an approved surgical skin marker. Once all sites were marked, the skin (epidermis, dermis, and hypodermis), as well as deeper tissues (fat, connective tissue and muscle) were infiltrated with a small amount of a short-acting local anesthetic, loaded on a 10cc syringe with a 25G, 1.5-in  Needle. An appropriate amount of time was allowed for local anesthetics to take effect before proceeding to the next step. Local Anesthetic: Lidocaine 2.0% The unused portion of the local anesthetic was discarded in the proper designated containers. Technical explanation of process:  L2 Medial Branch Nerve Block (MBB): The target area for the L2 medial branch is at the junction of the postero-lateral aspect of the superior articular process and the superior, posterior, and medial edge of the transverse process of L3. Under fluoroscopic guidance, a Quincke needle was inserted until contact was made with os over the superior postero-lateral aspect of the pedicular shadow (target area). After negative aspiration  for blood, 0.5 mL of the nerve block solution was injected without difficulty or complication. The needle was removed intact. L3 Medial Branch Nerve Block (MBB): The target area for the L3 medial branch is at the junction of the postero-lateral aspect of the superior articular process and the superior, posterior, and medial edge of the transverse process of L4. Under fluoroscopic guidance, a Quincke needle was inserted until contact was made with os over the superior postero-lateral aspect of the pedicular shadow (target area). After negative aspiration for blood, 0.5 mL of the nerve block solution was injected without difficulty or complication. The needle was removed intact. L4 Medial Branch Nerve Block (MBB): The target area for the L4 medial branch is at the junction of the postero-lateral aspect of the superior articular process and the superior, posterior, and medial edge of the transverse process of L5. Under fluoroscopic guidance, a Quincke needle was inserted until contact was made with os over the superior postero-lateral aspect of the pedicular shadow (target area). After negative aspiration for blood, 0.5 mL of the nerve block solution was injected without difficulty or complication. The needle was removed intact. L5 Medial Branch Nerve Block (MBB): The target area for the L5 medial branch is at the junction of the postero-lateral aspect of the superior articular process and the superior, posterior, and medial edge of the sacral ala. Under fluoroscopic guidance, a Quincke needle was inserted until contact was made with os over the superior postero-lateral aspect of the pedicular shadow (target area). After negative aspiration for blood, 0.5 mL of the nerve block solution was injected without difficulty or complication. The needle was removed intact. S1 Medial Branch Nerve Block (MBB): The target area for the S1 medial branch is at the posterior and inferior 6 o'clock position of the L5-S1 facet  joint. Under fluoroscopic guidance, the Quincke needle inserted for the L5 MBB was redirected until contact was made with os over the inferior and postero aspect of the sacrum, at the 6 o' clock position under the L5-S1 facet joint (Target area). After negative aspiration for blood, 0.5 mL of the nerve block solution was injected without difficulty or complication. The needle was  removed intact.  Nerve block solution: 0.2% PF-Ropivacaine + Triamcinolone (40 mg/mL) diluted to a final concentration of 4 mg of Triamcinolone/mL of Ropivacaine The unused portion of the solution was discarded in the proper designated containers. Procedural Needles: 22-gauge, 3.5-inch, Quincke needles used for all levels.  Once the entire procedure was completed, the treated area was cleaned, making sure to leave some of the prepping solution back to take advantage of its long term bactericidal properties.   Illustration of the posterior view of the lumbar spine and the posterior neural structures. Laminae of L2 through S1 are labeled. DPRL5, dorsal primary ramus of L5; DPRS1, dorsal primary ramus of S1; DPR3, dorsal primary ramus of L3; FJ, facet (zygapophyseal) joint L3-L4; I, inferior articular process of L4; LB1, lateral branch of dorsal primary ramus of L1; IAB, inferior articular branches from L3 medial branch (supplies L4-L5 facet joint); IBP, intermediate branch plexus; MB3, medial branch of dorsal primary ramus of L3; NR3, third lumbar nerve root; S, superior articular process of L5; SAB, superior articular branches from L4 (supplies L4-5 facet joint also); TP3, transverse process of L3.  Vitals:   07/16/18 0924 07/16/18 0934 07/16/18 0944 07/16/18 0953  BP: (!) 99/58 128/68 128/86 126/86  Pulse: 61     Resp: 12 14 15 16   Temp:  (!) 96.5 F (35.8 C)  (!) 97 F (36.1 C)  TempSrc:      SpO2: 99% 99% 100% 100%  Weight:      Height:         Start Time: 0913 hrs. End Time: 0925 hrs.  Imaging Guidance  (Spinal):          Type of Imaging Technique: Fluoroscopy Guidance (Spinal) Indication(s): Assistance in needle guidance and placement for procedures requiring needle placement in or near specific anatomical locations not easily accessible without such assistance. Exposure Time: Please see nurses notes. Contrast: None used. Fluoroscopic Guidance: I was personally present during the use of fluoroscopy. "Tunnel Vision Technique" used to obtain the best possible view of the target area. Parallax error corrected before commencing the procedure. "Direction-depth-direction" technique used to introduce the needle under continuous pulsed fluoroscopy. Once target was reached, antero-posterior, oblique, and lateral fluoroscopic projection used confirm needle placement in all planes. Images permanently stored in EMR. Interpretation: No contrast injected. I personally interpreted the imaging intraoperatively. Adequate needle placement confirmed in multiple planes. Permanent images saved into the patient's record.  Antibiotic Prophylaxis:   Anti-infectives (From admission, onward)   None     Indication(s): None identified  Post-operative Assessment:  Post-procedure Vital Signs:  Pulse/HCG Rate: 6162 Temp: (!) 97 F (36.1 C) Resp: 16 BP: 126/86 SpO2: 100 %  EBL: None  Complications: No immediate post-treatment complications observed by team, or reported by patient.  Note: The patient tolerated the entire procedure well. A repeat set of vitals were taken after the procedure and the patient was kept under observation following institutional policy, for this type of procedure. Post-procedural neurological assessment was performed, showing return to baseline, prior to discharge. The patient was provided with post-procedure discharge instructions, including a section on how to identify potential problems. Should any problems arise concerning this procedure, the patient was given instructions to immediately  contact us, at any time, without hesitation. In any case, we plan to contact the patient by telephone for a follow-up status report regarding this interventional procedure.  Comments:  No additional relevant information.  Plan of Care  Orders:  Orders Placed This Encounter  Procedures  LUMBAR FACET(MEDIAL BRANCH NERVE BLOCK) MBNB    Scheduling Instructions:     Side: Bilateral     Level: L3-4, L4-5, & L5-S1 Facets (L2, L3, L4, L5, & S1 Medial Branch Nerves)     Sedation: With Sedation.     Timeframe: Today    Order Specific Question:   Where will this procedure be performed?    Answer:   [facility]   DG PAIN CLINIC C-ARM 1-60 MIN NO REPORT    Intraoperative interpretation by procedural physician at [place].    Standing Status:   Standing    Number of Occurrences:   1    Order Specific Question:   Reason for exam:    Answer:   Assistance in needle guidance and placement for procedures requiring needle placement in or near specific anatomical locations not easily accessible without such assistance.   Provider attestation of informed consent for procedure/surgical case    I, the ordering provider, attest that I have discussed with the patient the benefits, risks, side effects, alternatives, likelihood of achieving goals and potential problems during recovery for the procedure that I have provided informed consent.    Standing Status:   Standing    Number of Occurrences:   1   Informed Consent Details: Transcribe to consent form and obtain patient signature    Standing Status:   Standing    Number of Occurrences:   1    Order Specific Question:   Procedure    Answer:   Bilateral Lumbar facet block (medial branch block) under fluoroscopic guidance. (See notes for levels.)    Order Specific Question:   Surgeon    Answer:   Berle Fitz A. Dossie Arbour, MD    Order Specific Question:   Indication/Reason    Answer:   Bilateral low back pain with or without lower  extremity pain   Medications ordered for procedure: Meds ordered this encounter  Medications   lidocaine (XYLOCAINE) 2 % (with pres) injection 400 mg   lactated ringers infusion 1,000 mL   midazolam (VERSED) 5 MG/5ML injection 1-2 mg    Make sure Flumazenil is available in the pyxis when using this medication. If oversedation occurs, administer 0.2 mg IV over 15 sec. If after 45 sec no response, administer 0.2 mg again over 1 min; may repeat at 1 min intervals; not to exceed 4 doses (1 mg)   fentaNYL (SUBLIMAZE) injection 25-50 mcg    Make sure Narcan is available in the pyxis when using this medication. In the event of respiratory depression (RR< 8/min): Titrate NARCAN (naloxone) in increments of 0.1 to 0.2 mg IV at 2-3 minute intervals, until desired degree of reversal.   ropivacaine (PF) 2 mg/mL (0.2%) (NAROPIN) injection 18 mL   triamcinolone acetonide (KENALOG-40) injection 80 mg   Medications administered: We administered lidocaine, lactated ringers, midazolam, fentaNYL, ropivacaine (PF) 2 mg/mL (0.2%), and triamcinolone acetonide.  See the medical record for exact dosing, route, and time of administration.  Disposition: Discharge home  Discharge Date & Time: 07/16/2018; 0954 hrs.   Follow-up plan:   Return in about 2 weeks (around 07/30/2018) for (VV), E/M (PP).     Future Appointments  Date Time Provider [place]  07/30/2018  8:20 AM [facility]  08/12/2018  1:45 PM Milinda Pointer, MD [facility] None  09/28/2018  8:45 AM Milinda Pointer, MD [facility] None   Primary Care Physician: Randel Books, FNP Location: [facility] Note by: Beatriz Chancellor  Farrel Conners, MD Date: 07/16/2018; Time: 10:18 AM  Disclaimer:  Medicine is not an Chief Strategy Officer. The only guarantee in medicine is that nothing is guaranteed. It is important to note that the decision to proceed with this intervention was based on the information collected from the  patient. The Data and conclusions were drawn from the patient's questionnaire, the interview, and the physical examination. Because the information was provided in large part by the patient, it cannot be guaranteed that it has not been purposely or unconsciously manipulated. Every effort has been made to obtain as much relevant data as possible for this evaluation. It is important to note that the conclusions that lead to this procedure are derived in large part from the available data. Always take into account that the treatment will also be dependent on availability of resources and existing treatment guidelines, considered by other Pain Management Practitioners as being common knowledge and practice, at the time of the intervention. For Medico-Legal purposes, it is also important to point out that variation in procedural techniques and pharmacological choices are the acceptable norm. The indications, contraindications, technique, and results of the above procedure should only be interpreted and judged by a Board-Certified Interventional Pain Specialist with extensive familiarity and expertise in the same exact procedure and technique.

## 2018-07-16 ENCOUNTER — Ambulatory Visit
Admission: RE | Admit: 2018-07-16 | Discharge: 2018-07-16 | Disposition: A | Discharge status: Home / Self Care | Payer: Medicare Other | Source: Ambulatory Visit | Attending: Pain Medicine | Admitting: Pain Medicine

## 2018-07-16 ENCOUNTER — Other Ambulatory Visit: Payer: Self-pay

## 2018-07-16 ENCOUNTER — Encounter: Payer: Self-pay | Admitting: Pain Medicine

## 2018-07-16 ENCOUNTER — Ambulatory Visit (HOSPITAL_BASED_OUTPATIENT_CLINIC_OR_DEPARTMENT_OTHER): Payer: Medicare Other | Admitting: Pain Medicine

## 2018-07-16 VITALS — BP 126/86 | HR 61 | Temp 97.0°F | Resp 16 | Ht 61.0 in | Wt 168.0 lb

## 2018-07-16 DIAGNOSIS — M47816 Spondylosis without myelopathy or radiculopathy, lumbar region: Secondary | ICD-10-CM

## 2018-07-16 DIAGNOSIS — M545 Low back pain, unspecified: Secondary | ICD-10-CM

## 2018-07-16 DIAGNOSIS — M47817 Spondylosis without myelopathy or radiculopathy, lumbosacral region: Secondary | ICD-10-CM | POA: Diagnosis present

## 2018-07-16 DIAGNOSIS — M5136 Other intervertebral disc degeneration, lumbar region: Secondary | ICD-10-CM | POA: Diagnosis present

## 2018-07-16 DIAGNOSIS — G8929 Other chronic pain: Secondary | ICD-10-CM | POA: Diagnosis present

## 2018-07-16 DIAGNOSIS — M51369 Other intervertebral disc degeneration, lumbar region without mention of lumbar back pain or lower extremity pain: Secondary | ICD-10-CM

## 2018-07-16 MED ORDER — TRIAMCINOLONE ACETONIDE 40 MG/ML IJ SUSP
80.0000 mg | Freq: Once | INTRAMUSCULAR | Status: AC
  Administered 2018-07-16: 80 mg
  Filled 2018-07-16: qty 2

## 2018-07-16 MED ORDER — ROPIVACAINE HCL 2 MG/ML IJ SOLN
18.0000 mL | Freq: Once | INTRAMUSCULAR | Status: AC
  Administered 2018-07-16: 18 mL via PERINEURAL
  Filled 2018-07-16: qty 20

## 2018-07-16 MED ORDER — MIDAZOLAM HCL 5 MG/5ML IJ SOLN
1.0000 mg | INTRAMUSCULAR | Status: DC | PRN
  Administered 2018-07-16: 2 mg via INTRAVENOUS
  Filled 2018-07-16: qty 5

## 2018-07-16 MED ORDER — FENTANYL CITRATE (PF) 100 MCG/2ML IJ SOLN
25.0000 ug | INTRAMUSCULAR | Status: DC | PRN
  Administered 2018-07-16: 50 ug via INTRAVENOUS
  Filled 2018-07-16: qty 2

## 2018-07-16 MED ORDER — LIDOCAINE HCL 2 % IJ SOLN
20.0000 mL | Freq: Once | INTRAMUSCULAR | Status: AC
  Administered 2018-07-16: 400 mg
  Filled 2018-07-16: qty 20

## 2018-07-16 MED ORDER — LACTATED RINGERS IV SOLN
1000.0000 mL | Freq: Once | INTRAVENOUS | Status: AC
  Administered 2018-07-16: 09:00:00 1000 mL via INTRAVENOUS

## 2018-07-17 ENCOUNTER — Telehealth: Payer: Self-pay

## 2018-07-17 NOTE — Telephone Encounter (Signed)
Post procedure phone call. Patient states she is doing good.  

## 2018-07-22 ENCOUNTER — Other Ambulatory Visit: Payer: Self-pay

## 2018-07-30 ENCOUNTER — Other Ambulatory Visit: Payer: Self-pay

## 2018-07-30 ENCOUNTER — Ambulatory Visit
Admission: RE | Admit: 2018-07-30 | Discharge: 2018-07-30 | Disposition: A | Discharge status: Home / Self Care | Payer: Medicare Other | Source: Ambulatory Visit | Attending: Family Medicine | Admitting: Family Medicine

## 2018-07-30 DIAGNOSIS — Z1231 Encounter for screening mammogram for malignant neoplasm of breast: Secondary | ICD-10-CM | POA: Insufficient documentation

## 2018-08-11 ENCOUNTER — Encounter: Payer: Self-pay | Admitting: Pain Medicine
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
## 2018-08-12 ENCOUNTER — Ambulatory Visit: Payer: Medicare Other | Attending: Pain Medicine | Admitting: Pain Medicine

## 2018-08-12 ENCOUNTER — Other Ambulatory Visit: Payer: Self-pay

## 2018-08-12 DIAGNOSIS — M47816 Spondylosis without myelopathy or radiculopathy, lumbar region: Secondary | ICD-10-CM | POA: Diagnosis not present

## 2018-08-12 DIAGNOSIS — M4310 Spondylolisthesis, site unspecified: Secondary | ICD-10-CM

## 2018-08-12 DIAGNOSIS — M5136 Other intervertebral disc degeneration, lumbar region: Secondary | ICD-10-CM

## 2018-08-12 DIAGNOSIS — M533 Sacrococcygeal disorders, not elsewhere classified: Secondary | ICD-10-CM

## 2018-08-12 DIAGNOSIS — G8929 Other chronic pain: Secondary | ICD-10-CM

## 2018-08-12 DIAGNOSIS — M545 Low back pain, unspecified: Secondary | ICD-10-CM

## 2018-08-12 NOTE — Patient Instructions (Signed)

## 2018-09-15 ENCOUNTER — Encounter: Payer: Self-pay | Admitting: Pain Medicine

## 2018-09-15 ENCOUNTER — Ambulatory Visit (HOSPITAL_BASED_OUTPATIENT_CLINIC_OR_DEPARTMENT_OTHER): Payer: Medicare Other | Admitting: Pain Medicine

## 2018-09-15 ENCOUNTER — Ambulatory Visit
Admission: RE | Admit: 2018-09-15 | Discharge: 2018-09-15 | Disposition: A | Discharge status: Home / Self Care | Payer: Medicare Other | Source: Ambulatory Visit | Attending: Pain Medicine | Admitting: Pain Medicine

## 2018-09-15 ENCOUNTER — Other Ambulatory Visit: Payer: Self-pay

## 2018-09-15 VITALS — BP 148/73 | HR 69 | Temp 96.8°F | Resp 16 | Ht 61.0 in | Wt 181.0 lb

## 2018-09-15 DIAGNOSIS — M5388 Other specified dorsopathies, sacral and sacrococcygeal region: Secondary | ICD-10-CM | POA: Diagnosis present

## 2018-09-15 DIAGNOSIS — G8918 Other acute postprocedural pain: Secondary | ICD-10-CM | POA: Insufficient documentation

## 2018-09-15 DIAGNOSIS — M545 Low back pain, unspecified: Secondary | ICD-10-CM

## 2018-09-15 DIAGNOSIS — M533 Sacrococcygeal disorders, not elsewhere classified: Secondary | ICD-10-CM | POA: Diagnosis present

## 2018-09-15 DIAGNOSIS — M47817 Spondylosis without myelopathy or radiculopathy, lumbosacral region: Secondary | ICD-10-CM | POA: Diagnosis present

## 2018-09-15 DIAGNOSIS — M47816 Spondylosis without myelopathy or radiculopathy, lumbar region: Secondary | ICD-10-CM | POA: Diagnosis present

## 2018-09-15 DIAGNOSIS — G8929 Other chronic pain: Secondary | ICD-10-CM | POA: Insufficient documentation

## 2018-09-15 DIAGNOSIS — M961 Postlaminectomy syndrome, not elsewhere classified: Secondary | ICD-10-CM | POA: Diagnosis present

## 2018-09-15 MED ORDER — ROPIVACAINE HCL 2 MG/ML IJ SOLN
9.0000 mL | Freq: Once | INTRAMUSCULAR | Status: AC
  Administered 2018-09-15: 12:00:00 9 mL via PERINEURAL
  Filled 2018-09-15: qty 10

## 2018-09-15 MED ORDER — OXYCODONE-ACETAMINOPHEN 5-325 MG PO TABS: 1.0000 | ORAL_TABLET | Freq: Four times a day (QID) | ORAL | 0 refills | Status: AC | PRN

## 2018-09-15 MED ORDER — LIDOCAINE HCL 2 % IJ SOLN
20.0000 mL | Freq: Once | INTRAMUSCULAR | Status: AC
  Administered 2018-09-15: 12:00:00 400 mg
  Filled 2018-09-15: qty 20

## 2018-09-15 MED ORDER — TRIAMCINOLONE ACETONIDE 40 MG/ML IJ SUSP
40.0000 mg | Freq: Once | INTRAMUSCULAR | Status: AC
  Administered 2018-09-15: 40 mg
  Filled 2018-09-15: qty 1

## 2018-09-15 MED ORDER — TRIAMCINOLONE ACETONIDE 40 MG/ML IJ SUSP
40.0000 mg | Freq: Once | INTRAMUSCULAR | Status: AC
  Administered 2018-09-15: 12:00:00 40 mg
  Filled 2018-09-15: qty 1

## 2018-09-15 MED ORDER — LACTATED RINGERS IV SOLN
1000.0000 mL | Freq: Once | INTRAVENOUS | Status: AC
  Administered 2018-09-15: 12:00:00 1000 mL via INTRAVENOUS

## 2018-09-15 MED ORDER — OXYCODONE-ACETAMINOPHEN 5-325 MG PO TABS: 1.0000 | ORAL_TABLET | Freq: Four times a day (QID) | ORAL | 0 refills | Status: DC | PRN

## 2018-09-15 MED ORDER — MIDAZOLAM HCL 5 MG/5ML IJ SOLN
1.0000 mg | INTRAMUSCULAR | Status: DC | PRN
  Administered 2018-09-15: 12:00:00 2 mg via INTRAVENOUS
  Filled 2018-09-15: qty 5

## 2018-09-15 MED ORDER — FENTANYL CITRATE (PF) 100 MCG/2ML IJ SOLN
25.0000 ug | INTRAMUSCULAR | Status: DC | PRN
  Administered 2018-09-15: 12:00:00 50 ug via INTRAVENOUS
  Filled 2018-09-15: qty 2

## 2018-09-15 NOTE — Progress Notes (Signed)
Patient's Name: [name]  MRN: [id number]  Referring Provider: Randel Books, FNP  DOB: Aug 30, 1945  PCP: Randel Books, FNP  DOS: 09/15/2018  Note by: Gaspar Cola, MD  Service setting: Ambulatory outpatient  Specialty: Interventional Pain Management  Patient type: Established  Location: [facility] (AMB) Pain Management Facility  Visit type: Interventional Procedure   Primary Reason for Visit: Interventional Pain Management Treatment. CC: Back Pain (lower)  Procedure:          Anesthesia, Analgesia, Anxiolysis:  Type: Thermal Lumbar Facet, Medial Branch Radiofrequency Ablation/Neurotomy  #3  Primary Purpose: Therapeutic Region: Posterolateral Lumbosacral Spine Level: L2, L3, L4, L5, & S1 Medial Branch Level(s). These levels will denervate the L3-4, L4-5, and the L5-S1 lumbar facet joints. Laterality: Left  Type: Moderate (Conscious) Sedation combined with Local Anesthesia Indication(s): Analgesia and Anxiety Route: Intravenous (IV) IV Access: Secured Sedation: Meaningful verbal contact was maintained at all times during the procedure  Local Anesthetic: Lidocaine 1-2%  Position: Prone   Indications: 1. Spondylosis without myelopathy or radiculopathy, lumbosacral region   2. Lumbar facet syndrome (Bilateral) (L>R)   3. Lumbar facet hypertrophy (L4-5)   4. Lumbar facet arthropathy ([place])   5. Chronic low back pain (Primary Source of Pain) (Bilateral) (L>R)   6. Failed back surgical syndrome    [name] has been dealing with the above chronic pain for longer than three months and has either failed to respond, was unable to tolerate, or simply did not get enough benefit from other more conservative therapies including, but not limited to: 1. Over-the-counter medications 2. Anti-inflammatory medications 3. Muscle relaxants 4. Membrane stabilizers 5. Opioids 6. Physical therapy and/or chiropractic manipulation 7. Modalities (Heat, ice, etc.) 8. Invasive  techniques such as nerve blocks. [name] has attained more than 50% relief of the pain from a series of diagnostic injections conducted in separate occasions.  Pain Score: Pre-procedure: 6 /10 Post-procedure: 0-No pain/10  Pre-op Assessment:  [name] is a 73 y.o. (year old), female patient, seen today for interventional treatment. She  has a past surgical history that includes Vaginal hysterectomy (1975); Incontinence surgery (2001); Oophorectomy (1978); Elbow Arthroplasty (1990); Cholecystectomy (1989); Rectocele repair (2009); back implant (2015); Mastectomy partial / lumpectomy (Bilateral, 1978 ); Augmentation mammaplasty (Bilateral, 1978); Colonoscopy with propofol (N/A, 01/29/2017); Spinal cord stimulator implant; Esophagogastroduodenoscopy (egd) with propofol (N/A, 11/07/2017); and Knee surgery. [name] has a current medication list which includes the following prescription(s): albuterol, alendronate, alprazolam, aspirin ec, atenolol, calcium, cetirizine, cholecalciferol, cinnamon, citalopram, conjugated estrogens, cyclobenzaprine, fenofibrate, fish oil-flax oil-borage oil, fluticasone-salmeterol, furosemide, gabapentin, meclizine, melatonin, vitamin k2, montelukast, naloxone, omega 3-6-9 complex, omeprazole, oxycodone, oxycodone, ropinirole, zinc gluconate, zolpidem, oxycodone, oxycodone-acetaminophen, and oxycodone-acetaminophen, and the following Facility-Administered Medications: fentanyl and midazolam. [name] primarily concern today is the Back Pain (lower)  Initial Vital Signs:  Pulse/HCG Rate: 68ECG Heart Rate: 69 Temp: 98 F (36.7 C) Resp: 18 BP: (!) 117/57 SpO2: 100 %  BMI: Estimated body mass index is 34.2 kg/m as calculated from the following:   Height as of this encounter: 5\' 1"  (1.549 m).   Weight as of this encounter: 181 lb (82.1 kg).  Risk Assessment: Allergies: Reviewed. She is allergic to aspirin; morphine; vicodin [hydrocodone-acetaminophen]; niacin and  related; and penicillins.  Allergy Precautions: None required Coagulopathies: Reviewed. None identified.  Blood-thinner therapy: None at this time Active Infection(s): Reviewed. None identified. [name] is afebrile  Site Confirmation: [name] was asked to confirm the procedure and laterality before marking the site  Procedure checklist: Completed Consent: Before the procedure and under the influence of no sedative(s), amnesic(s), or anxiolytics, the patient was informed of the treatment options, risks and possible complications. To fulfill our ethical and legal obligations, as recommended by the American Medical Association's Code of Ethics, I have informed the patient of my clinical impression; the nature and purpose of the treatment or procedure; the risks, benefits, and possible complications of the intervention; the alternatives, including doing nothing; the risk(s) and benefit(s) of the alternative treatment(s) or procedure(s); and the risk(s) and benefit(s) of doing nothing. The patient was provided information about the general risks and possible complications associated with the procedure. These may include, but are not limited to: failure to achieve desired goals, infection, bleeding, organ or nerve damage, allergic reactions, paralysis, and death. In addition, the patient was informed of those risks and complications associated to Spine-related procedures, such as failure to decrease pain; infection (i.e.: Meningitis, epidural or intraspinal abscess); bleeding (i.e.: epidural hematoma, subarachnoid hemorrhage, or any other type of intraspinal or peri-dural bleeding); organ or nerve damage (i.e.: Any type of peripheral nerve, nerve root, or spinal cord injury) with subsequent damage to sensory, motor, and/or autonomic systems, resulting in permanent pain, numbness, and/or weakness of one or several areas of the body; allergic reactions; (i.e.: anaphylactic reaction); and/or  death. Furthermore, the patient was informed of those risks and complications associated with the medications. These include, but are not limited to: allergic reactions (i.e.: anaphylactic or anaphylactoid reaction(s)); adrenal axis suppression; blood sugar elevation that in diabetics may result in ketoacidosis or comma; water retention that in patients with history of congestive heart failure may result in shortness of breath, pulmonary edema, and decompensation with resultant heart failure; weight gain; swelling or edema; medication-induced neural toxicity; particulate matter embolism and blood vessel occlusion with resultant organ, and/or nervous system infarction; and/or aseptic necrosis of one or more joints. Finally, the patient was informed that Medicine is not an exact science; therefore, there is also the possibility of unforeseen or unpredictable risks and/or possible complications that may result in a catastrophic outcome. The patient indicated having understood very clearly. We have given the patient no guarantees and we have made no promises. Enough time was given to the patient to ask questions, all of which were answered to the patient's satisfaction. Ms. Gilkerson has indicated that she wanted to continue with the procedure. Attestation: I, the ordering provider, attest that I have discussed with the patient the benefits, risks, side-effects, alternatives, likelihood of achieving goals, and potential problems during recovery for the procedure that I have provided informed consent. Date   Time: 09/15/2018 10:27 AM  Pre-Procedure Preparation:  Monitoring: As per clinic protocol. Respiration, ETCO2, SpO2, BP, heart rate and rhythm monitor placed and checked for adequate function Safety Precautions: Patient was assessed for positional comfort and pressure points before starting the procedure. Time-out: I initiated and conducted the "Time-out" before starting the procedure, as per protocol. The  patient was asked to participate by confirming the accuracy of the "Time Out" information. Verification of the correct person, site, and procedure were performed and confirmed by me, the nursing staff, and the patient. "Time-out" conducted as per Joint Commission's Universal Protocol (UP.01.01.01). Time: 1135  Description of Procedure:          Laterality: Left Levels:  L2, L3, L4, L5, & S1 Medial Branch Level(s), at the L3-4, L4-5, and the L5-S1 lumbar facet joints. Area Prepped: Lumbosacral Prepping solution: DuraPrep (Iodine Povacrylex [0.7% available iodine] and  Isopropyl Alcohol, 74% w/w) Safety Precautions: Aspiration looking for blood return was conducted prior to all injections. At no point did we inject any substances, as a needle was being advanced. Before injecting, the patient was told to immediately notify me if she was experiencing any new onset of "ringing in the ears, or metallic taste in the mouth". No attempts were made at seeking any paresthesias. Safe injection practices and needle disposal techniques used. Medications properly checked for expiration dates. SDV (single dose vial) medications used. After the completion of the procedure, all disposable equipment used was discarded in the proper designated medical waste containers. Local Anesthesia: Protocol guidelines were followed. The patient was positioned over the fluoroscopy table. The area was prepped in the usual manner. The time-out was completed. The target area was identified using fluoroscopy. A 12-in long, straight, sterile hemostat was used with fluoroscopic guidance to locate the targets for each level blocked. Once located, the skin was marked with an approved surgical skin marker. Once all sites were marked, the skin (epidermis, dermis, and hypodermis), as well as deeper tissues (fat, connective tissue and muscle) were infiltrated with a small amount of a short-acting local anesthetic, loaded on a 10cc syringe with a 25G,  1.5-in  Needle. An appropriate amount of time was allowed for local anesthetics to take effect before proceeding to the next step. Local Anesthetic: Lidocaine 2.0% The unused portion of the local anesthetic was discarded in the proper designated containers. Technical explanation of process:  Radiofrequency Ablation (RFA) L2 Medial Branch Nerve RFA: The target area for the L2 medial branch is at the junction of the postero-lateral aspect of the superior articular process and the superior, posterior, and medial edge of the transverse process of L3. Under fluoroscopic guidance, a Radiofrequency needle was inserted until contact was made with os over the superior postero-lateral aspect of the pedicular shadow (target area). Sensory and motor testing was conducted to properly adjust the position of the needle. Once satisfactory placement of the needle was achieved, the numbing solution was slowly injected after negative aspiration for blood. 2.0 mL of the nerve block solution was injected without difficulty or complication. After waiting for at least 3 minutes, the ablation was performed. Once completed, the needle was removed intact. L3 Medial Branch Nerve RFA: The target area for the L3 medial branch is at the junction of the postero-lateral aspect of the superior articular process and the superior, posterior, and medial edge of the transverse process of L4. Under fluoroscopic guidance, a Radiofrequency needle was inserted until contact was made with os over the superior postero-lateral aspect of the pedicular shadow (target area). Sensory and motor testing was conducted to properly adjust the position of the needle. Once satisfactory placement of the needle was achieved, the numbing solution was slowly injected after negative aspiration for blood. 2.0 mL of the nerve block solution was injected without difficulty or complication. After waiting for at least 3 minutes, the ablation was performed. Once completed,  the needle was removed intact. L4 Medial Branch Nerve RFA: The target area for the L4 medial branch is at the junction of the postero-lateral aspect of the superior articular process and the superior, posterior, and medial edge of the transverse process of L5. Under fluoroscopic guidance, a Radiofrequency needle was inserted until contact was made with os over the superior postero-lateral aspect of the pedicular shadow (target area). Sensory and motor testing was conducted to properly adjust the position of the needle. Once satisfactory placement of the needle  was achieved, the numbing solution was slowly injected after negative aspiration for blood. 2.0 mL of the nerve block solution was injected without difficulty or complication. After waiting for at least 3 minutes, the ablation was performed. Once completed, the needle was removed intact. L5 Medial Branch Nerve RFA: The target area for the L5 medial branch is at the junction of the postero-lateral aspect of the superior articular process of S1 and the superior, posterior, and medial edge of the sacral ala. Under fluoroscopic guidance, a Radiofrequency needle was inserted until contact was made with os over the superior postero-lateral aspect of the pedicular shadow (target area). Sensory and motor testing was conducted to properly adjust the position of the needle. Once satisfactory placement of the needle was achieved, the numbing solution was slowly injected after negative aspiration for blood. 2.0 mL of the nerve block solution was injected without difficulty or complication. After waiting for at least 3 minutes, the ablation was performed. Once completed, the needle was removed intact. S1 Medial Branch Nerve RFA: The target area for the S1 medial branch is located inferior to the junction of the S1 superior articular process and the L5 inferior articular process, posterior, inferior, and lateral to the 6 o'clock position of the L5-S1 facet joint, just  superior to the S1 posterior foramen. Under fluoroscopic guidance, the Radiofrequency needle was advanced until contact was made with os over the Target area. Sensory and motor testing was conducted to properly adjust the position of the needle. Once satisfactory placement of the needle was achieved, the numbing solution was slowly injected after negative aspiration for blood. 2.0 mL of the nerve block solution was injected without difficulty or complication. After waiting for at least 3 minutes, the ablation was performed. Once completed, the needle was removed intact. Radiofrequency lesioning (ablation):  Radiofrequency Generator: NeuroTherm [id number] Sensory Stimulation Parameters: 50 Hz was used to locate & identify the nerve, making sure that the needle was positioned such that there was no sensory stimulation below 0.3 V or above 0.7 V. Motor Stimulation Parameters: 2 Hz was used to evaluate the motor component. Care was taken not to lesion any nerves that demonstrated motor stimulation of the lower extremities at an output of less than 2.5 times that of the sensory threshold, or a maximum of 2.0 V. Lesioning Technique Parameters: Standard Radiofrequency settings. (Not bipolar or pulsed.) Temperature Settings: 80 degrees C Lesioning time: 60 seconds Intra-operative Compliance: Compliant Materials & Medications: Needle(s) (Electrode/Cannula) Type: Teflon-coated, curved tip, Radiofrequency needle(s) Gauge: 22G Length: 10cm Numbing solution: 0.2% PF-Ropivacaine + Triamcinolone (40 mg/mL) diluted to a final concentration of 4 mg of Triamcinolone/mL of Ropivacaine The unused portion of the solution was discarded in the proper designated containers.  Once the entire procedure was completed, the treated area was cleaned, making sure to leave some of the prepping solution back to take advantage of its long term bactericidal properties.  Illustration of the posterior view of the lumbar spine and the  posterior neural structures. Laminae of L2 through S1 are labeled. DPRL5, dorsal primary ramus of L5; DPRS1, dorsal primary ramus of S1; DPR3, dorsal primary ramus of L3; FJ, facet (zygapophyseal) joint L3-L4; I, inferior articular process of L4; LB1, lateral branch of dorsal primary ramus of L1; IAB, inferior articular branches from L3 medial branch (supplies L4-L5 facet joint); IBP, intermediate branch plexus; MB3, medial branch of dorsal primary ramus of L3; NR3, third lumbar nerve root; S, superior articular process of L5; SAB, superior articular branches from L4 (  supplies L4-5 facet joint also); TP3, transverse process of L3.  Vitals:   09/15/18 1230 09/15/18 1240 09/15/18 1250 09/15/18 1300  BP: 140/79 (!) 130/58 (!) 146/77 (!) 148/73  Pulse: 69     Resp: 19 15 16 16   Temp:  (!) 96.8 F (36 C)    TempSrc:      SpO2: 100% 100% 100% 100%  Weight:      Height:        Start Time: 1135 hrs. End Time: 1129 hrs.  Imaging Guidance (Spinal):          Type of Imaging Technique: Fluoroscopy Guidance (Spinal) Indication(s): Assistance in needle guidance and placement for procedures requiring needle placement in or near specific anatomical locations not easily accessible without such assistance. Exposure Time: Please see nurses notes. Contrast: None used. Fluoroscopic Guidance: I was personally present during the use of fluoroscopy. "Tunnel Vision Technique" used to obtain the best possible view of the target area. Parallax error corrected before commencing the procedure. "Direction-depth-direction" technique used to introduce the needle under continuous pulsed fluoroscopy. Once target was reached, antero-posterior, oblique, and lateral fluoroscopic projection used confirm needle placement in all planes. Images permanently stored in EMR. Interpretation: No contrast injected. I personally interpreted the imaging intraoperatively. Adequate needle placement confirmed in multiple planes. Permanent  images saved into the patient's record.  Antibiotic Prophylaxis:   Anti-infectives (From admission, onward)   None     Indication(s): None identified  Post-operative Assessment:  Post-procedure Vital Signs:  Pulse/HCG Rate: 6967 Temp: (!) 96.8 F (36 C) Resp: 16 BP: (!) 148/73 SpO2: 100 %  EBL: None  Complications: No immediate post-treatment complications observed by team, or reported by patient.  Note: The patient tolerated the entire procedure well. A repeat set of vitals were taken after the procedure and the patient was kept under observation following institutional policy, for this type of procedure. Post-procedural neurological assessment was performed, showing return to baseline, prior to discharge. The patient was provided with post-procedure discharge instructions, including a section on how to identify potential problems. Should any problems arise concerning this procedure, the patient was given instructions to immediately contact us, at any time, without hesitation. In any case, we plan to contact the patient by telephone for a follow-up status report regarding this interventional procedure.  Comments:  No additional relevant information.  Plan of Care  Orders:  Orders Placed This Encounter  Procedures   Radiofrequency,Lumbar    Scheduling Instructions:     Side(s): Left-sided     Level: L3-4, L4-5, & L5-S1 Facets (L2, L3, L4, L5, & S1 Medial Branch Nerves)     Sedation: With Sedation     Timeframe: Today    Order Specific Question:   Where will this procedure be performed?    Answer:   [facility]   Radiofrequency Sacroiliac Joint    Scheduling Instructions:     Side(s): Left-sided     Level(s): L4, L5, S1, S2, & S3 Medial Branch Nerve(s)     Sedation: With Sedation     Timeframe: Today     Procedure: Sacroiliac joint RFA   [facility] C-ARM 1-60 MIN NO REPORT    Intraoperative interpretation by procedural physician at [place].     Standing Status:   Standing    Number of Occurrences:   1    Order Specific Question:   Reason for exam:    Answer:   Assistance in needle guidance and placement for procedures  requiring needle placement in or near specific anatomical locations not easily accessible without such assistance.   Provider attestation of informed consent for procedure/surgical case    I, the ordering provider, attest that I have discussed with the patient the benefits, risks, side effects, alternatives, likelihood of achieving goals and potential problems during recovery for the procedure that I have provided informed consent.    Standing Status:   Standing    Number of Occurrences:   1   Informed Consent Details: Transcribe to consent form and obtain patient signature    Consent Attestation: I, the ordering provider, attest that I have discussed with the patient the benefits, risks, side-effects, alternatives, likelihood of achieving goals, and potential problems during recovery for the procedure that I have provided informed consent.    Standing Status:   Standing    Number of Occurrences:   1    Order Specific Question:   Procedure    Answer:   Left Lumbar facet medial branch RFA under fluoroscopic guidance.    Order Specific Question:   Surgeon    Answer:   Kathlen Brunswick. Dossie Arbour, MD    Order Specific Question:   Indication/Reason    Answer:   Chronic low back pain secondary to lumbar facet syndrome   Informed Consent Details: Transcribe to consent form and obtain patient signature    Consent Attestation: I, the ordering provider, attest that I have discussed with the patient the benefits, risks, side-effects, alternatives, likelihood of achieving goals, and potential problems during recovery for the procedure that I have provided informed consent.    Standing Status:   Standing    Number of Occurrences:   1    Order Specific Question:   Procedure    Answer:   Sacroiliac joint RFA under fluoroscopic  guidance    Order Specific Question:   Surgeon    Answer:   Ad Guttman A. Dossie Arbour, MD    Order Specific Question:   Indication/Reason    Answer:   Chronic low back pain secondary to sacroiliac joint arthralgia   Chronic Opioid Analgesic:  Oxycodone IR 5 mg every 6 hours (20 mg/day) (has enough to last until 10/23/2018) MME/day:30 mg/day.   Medications ordered for procedure: Meds ordered this encounter  Medications   lidocaine (XYLOCAINE) 2 % (with pres) injection 400 mg   lactated ringers infusion 1,000 mL   midazolam (VERSED) 5 MG/5ML injection 1-2 mg    Make sure Flumazenil is available in the pyxis when using this medication. If oversedation occurs, administer 0.2 mg IV over 15 sec. If after 45 sec no response, administer 0.2 mg again over 1 min; may repeat at 1 min intervals; not to exceed 4 doses (1 mg)   fentaNYL (SUBLIMAZE) injection 25-50 mcg    Make sure Narcan is available in the pyxis when using this medication. In the event of respiratory depression (RR< 8/min): Titrate NARCAN (naloxone) in increments of 0.1 to 0.2 mg IV at 2-3 minute intervals, until desired degree of reversal.   ropivacaine (PF) 2 mg/mL (0.2%) (NAROPIN) injection 9 mL   triamcinolone acetonide (KENALOG-40) injection 40 mg   ropivacaine (PF) 2 mg/mL (0.2%) (NAROPIN) injection 9 mL   triamcinolone acetonide (KENALOG-40) injection 40 mg   oxyCODONE-acetaminophen (PERCOCET) 5-325 MG tablet    Sig: Take 1 tablet by mouth every 6 (six) hours as needed for up to 7 days for severe pain. Must last 7 days.    Dispense:  28 tablet    Refill:  0    For acute post-operative pain. Not to be refilled. Most last 7 days.   oxyCODONE-acetaminophen (PERCOCET) 5-325 MG tablet    Sig: Take 1 tablet by mouth every 6 (six) hours as needed for up to 7 days for severe pain. Must last 7 days.    Dispense:  28 tablet    Refill:  0    For acute post-operative pain. Not to be refilled. Must last 7 days.   Medications  administered: We administered lidocaine, lactated ringers, midazolam, fentaNYL, ropivacaine (PF) 2 mg/mL (0.2%), triamcinolone acetonide, ropivacaine (PF) 2 mg/mL (0.2%), and triamcinolone acetonide.  See the medical record for exact dosing, route, and time of administration.  Follow-up plan:   Return for (VV), 2 wk PP-F/U Eval.       Interventional management options:  Considering: Diagnostic bilateral intra-articular hip injection. Diagnostic/therapeutic right L2-3 LESI #2 Diagnostic/therapeutic L1-2 vs L4-5LESI. Diagnostic/therapeutic bilateral L4-5 TFESI.   Palliative PRN treatment(s): Diagnostic bilateral intra-articular hip injection. Palliativeleftlumbar facetRFA #4(Last done: 09/15/2018) Palliativerightlumbar facetRFA #2(Last done: 06/06/2016) Palliative bilateral lumbar facet blocks  Palliativeleft sacroiliac joint block. Palliativeleft sacroiliac jointRFA #3(Last done: 07/10/2017) Palliative left-sided L4 & L5 TFESI #3  Palliative right-sided L1-2 LESI #2 + left sided L4 transforaminal ESI #3     Recent Visits Date Type Provider Dept  08/12/18 Office Visit Milinda Pointer, MD [facility]  07/16/18 Procedure visit Milinda Pointer, MD [facility]  07/06/18 Office Visit Milinda Pointer, MD [facility]  Showing recent visits within past 90 days and meeting all other requirements   Today's Visits Date Type Provider Dept  09/15/18 Procedure visit Milinda Pointer, MD [facility]  Showing today's visits and meeting all other requirements   Future Appointments Date Type Provider Dept  09/28/18 Appointment Milinda Pointer, MD [facility]  Showing future appointments within next 90 days and meeting all other requirements   Disposition: Discharge home  Discharge Date & Time: 09/15/2018; 1300 hrs.   Primary Care Physician: Randel Books, FNP Location: [facility]  Facility Note by: Gaspar Cola, MD Date: 09/15/2018; Time: 1:32 PM  Disclaimer:  Medicine is not an Chief Strategy Officer. The only guarantee in medicine is that nothing is guaranteed. It is important to note that the decision to proceed with this intervention was based on the information collected from the patient. The Data and conclusions were drawn from the patient's questionnaire, the interview, and the physical examination. Because the information was provided in large part by the patient, it cannot be guaranteed that it has not been purposely or unconsciously manipulated. Every effort has been made to obtain as much relevant data as possible for this evaluation. It is important to note that the conclusions that lead to this procedure are derived in large part from the available data. Always take into account that the treatment will also be dependent on availability of resources and existing treatment guidelines, considered by other Pain Management Practitioners as being common knowledge and practice, at the time of the intervention. For Medico-Legal purposes, it is also important to point out that variation in procedural techniques and pharmacological choices are the acceptable norm. The indications, contraindications, technique, and results of the above procedure should only be interpreted and judged by a Board-Certified Interventional Pain Specialist with extensive familiarity and expertise in the same exact procedure and technique.

## 2018-09-15 NOTE — Patient Instructions (Addendum)
___________________________________________________________________________________________  Post-Radiofrequency (RF) Discharge Instructions  You have just completed a Radiofrequency Neurotomy.  The following instructions will provide you with information and guidelines for self-care upon discharge.  If at any time you have questions or concerns please call your physician. DO NOT DRIVE YOURSELF!!  Instructions:  Apply ice: Fill a plastic sandwich bag with crushed ice. Cover it with a small towel and apply to injection site. Apply for 15 minutes then remove x 15 minutes. Repeat sequence on day of procedure, until you go to bed. The purpose is to minimize swelling and discomfort after procedure.  Apply heat: Apply heat to procedure site starting the day following the procedure. The purpose is to treat any soreness and discomfort from the procedure.  Food intake: No eating limitations, unless stipulated above.  Nevertheless, if you have had sedation, you may experience some nausea.  In this case, it may be wise to wait at least two hours prior to resuming regular diet.  Physical activities: Keep activities to a minimum for the first 8 hours after the procedure. For the first 24 hours after the procedure, do not drive a motor vehicle,  Operate heavy machinery, power tools, or handle any weapons.  Consider walking with the use of an assistive device or accompanied by an adult for the first 24 hours.  Do not drink alcoholic beverages including beer.  Do not make any important decisions or sign any legal documents. Go home and rest today.  Resume activities tomorrow, as tolerated.  Use caution in moving about as you may experience mild leg weakness.  Use caution in cooking, use of household electrical appliances and climbing steps.  Driving: If you have received any sedation, you are not allowed to drive for 24 hours after your procedure.  Blood thinner: Restart your blood thinner 6 hours after your  procedure. (Only for those taking blood thinners)  Insulin: As soon as you can eat, you may resume your normal dosing schedule. (Only for those taking insulin)  Medications: May resume pre-procedure medications.  Do not take any drugs, other than what has been prescribed to you.  Infection prevention: Keep procedure site clean and dry.  Post-procedure Pain Diary: Extremely important that this be done correctly and accurately. Recorded information will be used to determine the next step in treatment.  Pain evaluated is that of treated area only. Do not include pain from an untreated area.  Complete every hour, on the hour, for the initial 8 hours. Set an alarm to help you do this part accurately.  Do not go to sleep and have it completed later. It will not be accurate.  Follow-up appointment: Keep your follow-up appointment after the procedure. Usually 2 weeks for most procedures. (6 weeks in the case of radiofrequency.) Bring you pain diary.   Expect:  From numbing medicine (AKA: Local Anesthetics): Numbness or decrease in pain.  Onset: Full effect within 15 minutes of injected.  Duration: It will depend on the type of local anesthetic used. On the average, 1 to 8 hours.   From steroids: Decrease in swelling or inflammation. Once inflammation is improved, relief of the pain will follow.  Onset of benefits: Depends on the amount of swelling present. The more swelling, the longer it will take for the benefits to be seen. In some cases, up to 10 days.  Duration: Steroids will stay in the system x 2 weeks. Duration of benefits will depend on multiple posibilities including persistent irritating factors.  From procedure: Some   discomfort is to be expected once the numbing medicine wears off. This should be minimal if ice and heat are applied as instructed.  Call if:  You experience numbness and weakness that gets worse with time, as opposed to wearing off.  He experience any unusual  bleeding, difficulty breathing, or loss of the ability to control your bowel and bladder. (This applies to Spinal procedures only)  You experience any redness, swelling, heat, red streaks, elevated temperature, fever, or any other signs of a possible infection.  Emergency Numbers:  [facility] hours (Monday - Thursday, 8:00 AM - 4:00 PM) (Friday, 9:00 AM - 12:00 Noon): [phone]  After hours: [phone] ____________________________________________________________________________________________   Pain Management Discharge Instructions  General Discharge Instructions :  If you need to reach your doctor call: Monday-Friday 8:00 am - 4:00 pm at [phone] or toll free [phone].  After clinic hours [phone] to have operator reach doctor.  Bring all of your medication bottles to all your appointments in the pain clinic.  To cancel or reschedule your appointment with Pain Management please remember to call 24 hours in advance to avoid a fee.  Refer to the educational materials which you have been given on: General Risks, I had my Procedure. Discharge Instructions, Post Sedation.  Post Procedure Instructions:  The drugs you were given will stay in your system until tomorrow, so for the next 24 hours you should not drive, make any legal decisions or drink any alcoholic beverages.  You may eat anything you prefer, but it is better to start with liquids then soups and crackers, and gradually work up to solid foods.  Please notify your doctor immediately if you have any unusual bleeding, trouble breathing or pain that is not related to your normal pain.  Depending on the type of procedure that was done, some parts of your body may feel week and/or numb.  This usually clears up by tonight or the next day.  Walk with the use of an assistive device or accompanied by an adult for the 24 hours.  You may use ice on the affected area for the first 24 hours.  Put ice in a  Ziploc bag and cover with a towel and place against area 15 minutes on 15 minutes off.  You may switch to heat after 24 hours. Radiofrequency Lesioning Radiofrequency lesioning is a procedure that is performed to relieve pain. The procedure is often used for back, neck, or arm pain. Radiofrequency lesioning involves the use of a machine that creates radio waves to make heat. During the procedure, the heat is applied to the nerve that carries the pain signal. The heat damages the nerve and interferes with the pain signal. Pain relief usually starts about 2 weeks after the procedure and lasts for 6 months to 1 year. You will be awake during the procedure. You will need to be able to talk with the health care provider during the procedure. Tell a health care provider about:  Any allergies you have.  All medicines you are taking, including vitamins, herbs, eye drops, creams, and over-the-counter medicines.  Any problems you or family members have had with anesthetic medicines.  Any blood disorders you have.  Any surgeries you have had.  Any medical conditions you have or have had.  Whether you are pregnant or may be pregnant. What are the risks? Generally, this is a safe procedure. However, problems may occur, including:  Pain or soreness at the injection site.  Allergic reaction to  medicines given during the procedure.  Bleeding.  Infection at the injection site.  Damage to nerves or blood vessels. What happens before the procedure? Staying hydrated Follow instructions from your health care provider about hydration, which may include:  Up to 2 hours before the procedure - you may continue to drink clear liquids, such as water, clear fruit juice, black coffee, and plain tea. Eating and drinking Follow instructions from your health care provider about eating and drinking, which may include:  8 hours before the procedure - stop eating heavy meals or foods, such as meat, fried foods,  or fatty foods.  6 hours before the procedure - stop eating light meals or foods, such as toast or cereal.  6 hours before the procedure - stop drinking milk or drinks that contain milk.  2 hours before the procedure - stop drinking clear liquids. Medicines Ask your health care provider about:  Changing or stopping your regular medicines. This is especially important if you are taking diabetes medicines or blood thinners.  Taking medicines such as aspirin and ibuprofen. These medicines can thin your blood. Do not take these medicines unless your health care provider tells you to take them.  Taking over-the-counter medicines, vitamins, herbs, and supplements. General instructions  Plan to have someone take you home from the hospital or clinic.  If you will be going home right after the procedure, plan to have someone with you for 24 hours.  Ask your health care provider what steps will be taken to help prevent infection. These may include: ? Removing hair at the procedure site. ? Washing skin with a germ-killing soap. ? Taking antibiotic medicine. What happens during the procedure?   An IV will be inserted into one of your veins.  You will be given one or more of the following: ? A medicine to help you relax (sedative). ? A medicine to numb the area (local anesthetic).  Your health care provider will insert a radiofrequency needle into the area to be treated. This is done with the help of a type of X-ray (fluoroscopy).  A wire that carries the radio waves (electrode) will be put through the radiofrequency needle.  An electrical pulse will be sent through the electrode to verify the correct nerve that is causing your pain. You will feel a tingling sensation, and you may have muscle twitching.  The tissue around the needle tip will be heated by an electric current that comes from the radiofrequency machine. This will numb the nerves.  The needle will be removed.  A bandage  (dressing) will be put on the insertion area. The procedure may vary among health care providers and hospitals. What happens after the procedure?  Your blood pressure, heart rate, breathing rate, and blood oxygen level will be monitored until you leave the hospital or clinic.  Return to your normal activities as told by your health care provider. Ask your health care provider what activities are safe for you.  Do not drive for 24 hours if you were given a sedative during your procedure. Summary  Radiofrequency lesioning is a procedure that is performed to relieve pain. The procedure is often used for back, neck, or arm pain.  Radiofrequency lesioning involves the use of a machine that creates radio waves to make heat.  Plan to have someone take you home from the hospital or clinic. Do not drive for 24 hours if you were given a sedative during your procedure.  Return to your normal activities as  told by your health care provider. Ask your health care provider what activities are safe for you. This information is not intended to replace advice given to you by your health care provider. Make sure you discuss any questions you have with your health care provider. Document Released: 09/12/2010 Document Revised: 10/02/2017 Document Reviewed: 10/02/2017 Elsevier Patient Education  2020 [employer].

## 2018-09-16 ENCOUNTER — Telehealth: Payer: Self-pay | Admitting: *Deleted

## 2018-09-16 NOTE — Telephone Encounter (Signed)
No post procedure issues.

## 2018-09-24 ENCOUNTER — Encounter: Payer: Self-pay | Admitting: Pain Medicine

## 2018-09-27 DIAGNOSIS — M48062 Spinal stenosis, lumbar region with neurogenic claudication: Secondary | ICD-10-CM | POA: Insufficient documentation
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
## 2018-09-27 NOTE — Patient Instructions (Signed)
____________________________________________________________________________________________  Medication Recommendations and Reminders  Applies to: All patients receiving prescriptions (written and/or electronic).  Medication Rules & Regulations: These rules and regulations exist for your safety and that of others. They are not flexible and neither are we. Dismissing or ignoring them will be considered "non-compliance" with medication therapy, resulting in complete and irreversible termination of such therapy. (See document titled "Medication Rules" for more details.) In all conscience, because of safety reasons, we cannot continue providing a therapy where the patient does not follow instructions.  Pharmacy of record:   Definition: This is the pharmacy where your electronic prescriptions will be sent.   We do not endorse any particular pharmacy.  You are not restricted in your choice of pharmacy.  The pharmacy listed in the electronic medical record should be the one where you want electronic prescriptions to be sent.  If you choose to change pharmacy, simply notify our nursing staff of your choice of new pharmacy.  Recommendations:  Keep all of your pain medications in a safe place, under lock and key, even if you live alone.   After you fill your prescription, take 1 week's worth of pills and put them away in a safe place. You should keep a separate, properly labeled bottle for this purpose. The remainder should be kept in the original bottle. Use this as your primary supply, until it runs out. Once it's gone, then you know that you have 1 week's worth of medicine, and it is time to come in for a prescription refill. If you do this correctly, it is unlikely that you will ever run out of medicine.  To make sure that the above recommendation works, it is very important that you make sure your medication refill appointments are scheduled at least 1 week before you run out of medicine. To do  this in an effective manner, make sure that you do not leave the office without scheduling your next medication management appointment. Always ask the nursing staff to show you in your prescription , when your medication will be running out. Then arrange for the receptionist to get you a return appointment, at least 7 days before you run out of medicine. Do not wait until you have 1 or 2 pills left, to come in. This is very poor planning and does not take into consideration that we may need to cancel appointments due to bad weather, sickness, or emergencies affecting our staff.  "Partial Fill": If for any reason your pharmacy does not have enough pills/tablets to completely fill or refill your prescription, do not allow for a "partial fill". You will need a separate prescription to fill the remaining amount, which we will not provide. If the reason for the partial fill is your insurance, you will need to talk to the pharmacist about payment alternatives for the remaining tablets, but again, do not accept a partial fill.  Prescription refills and/or changes in medication(s):   Prescription refills, and/or changes in dose or medication, will be conducted only during scheduled medication management appointments. (Applies to both, written and electronic prescriptions.)  No refills on procedure days. No medication will be changed or started on procedure days. No changes, adjustments, and/or refills will be conducted on a procedure day. Doing so will interfere with the diagnostic portion of the procedure.  No phone refills. No medications will be "called into the pharmacy".  No Fax refills.  No weekend refills.  No Holliday refills.  No after hours refills.  Remember:  Business   hours are:  Monday to Thursday 8:00 AM to 4:00 PM Provider's Schedule: Caulin Begley, MD - Appointments are:  Medication management: Monday and Wednesday 8:00 AM to 4:00 PM Procedure day: Tuesday and Thursday 7:30 AM  to 4:00 PM Bilal Lateef, MD - Appointments are:  Medication management: Tuesday and Thursday 8:00 AM to 4:00 PM Procedure day: Monday and Wednesday 7:30 AM to 4:00 PM (Last update: 03/27/2017) ____________________________________________________________________________________________    

## 2018-09-28 ENCOUNTER — Ambulatory Visit: Payer: Medicare Other | Attending: Pain Medicine | Admitting: Pain Medicine

## 2018-09-28 ENCOUNTER — Other Ambulatory Visit: Payer: Self-pay | Admitting: Pain Medicine

## 2018-09-28 ENCOUNTER — Other Ambulatory Visit: Payer: Self-pay

## 2018-09-28 ENCOUNTER — Telehealth: Payer: Self-pay

## 2018-09-28 DIAGNOSIS — G894 Chronic pain syndrome: Secondary | ICD-10-CM

## 2018-09-28 DIAGNOSIS — M4310 Spondylolisthesis, site unspecified: Secondary | ICD-10-CM

## 2018-09-28 DIAGNOSIS — M792 Neuralgia and neuritis, unspecified: Secondary | ICD-10-CM

## 2018-09-28 DIAGNOSIS — M899 Disorder of bone, unspecified: Secondary | ICD-10-CM

## 2018-09-28 DIAGNOSIS — M47816 Spondylosis without myelopathy or radiculopathy, lumbar region: Secondary | ICD-10-CM

## 2018-09-28 DIAGNOSIS — M62830 Muscle spasm of back: Secondary | ICD-10-CM

## 2018-09-28 DIAGNOSIS — G8929 Other chronic pain: Secondary | ICD-10-CM

## 2018-09-28 DIAGNOSIS — M545 Low back pain: Secondary | ICD-10-CM

## 2018-09-28 DIAGNOSIS — M7918 Myalgia, other site: Secondary | ICD-10-CM

## 2018-09-28 DIAGNOSIS — Z789 Other specified health status: Secondary | ICD-10-CM

## 2018-09-28 DIAGNOSIS — M79605 Pain in left leg: Secondary | ICD-10-CM

## 2018-09-28 DIAGNOSIS — Z79899 Other long term (current) drug therapy: Secondary | ICD-10-CM

## 2018-09-28 DIAGNOSIS — M797 Fibromyalgia: Secondary | ICD-10-CM

## 2018-09-28 DIAGNOSIS — M48062 Spinal stenosis, lumbar region with neurogenic claudication: Secondary | ICD-10-CM

## 2018-09-28 MED ORDER — OXYCODONE HCL 5 MG PO TABS: 5.0000 mg | ORAL_TABLET | Freq: Four times a day (QID) | ORAL | 0 refills | Status: DC | PRN

## 2018-09-28 MED ORDER — GABAPENTIN 300 MG PO CAPS: ORAL_CAPSULE | ORAL | 3 refills | Status: DC

## 2018-09-28 MED ORDER — MAGNESIUM OXIDE -MG SUPPLEMENT 500 MG PO CAPS: 1.0000 | ORAL_CAPSULE | Freq: Two times a day (BID) | ORAL | 3 refills | Status: DC

## 2018-09-28 MED ORDER — CYCLOBENZAPRINE HCL 10 MG PO TABS: 10.0000 mg | ORAL_TABLET | Freq: Three times a day (TID) | ORAL | 3 refills | Status: DC | PRN

## 2018-09-28 NOTE — Telephone Encounter (Signed)
Pt wanted Dr Dossie Arbour to know she is having labs done with PCP tomorrow, She said to let them know if you need any additional labs done.

## 2018-09-28 NOTE — Telephone Encounter (Signed)
Labs ordered by Dr Dossie Arbour.  Patient notified of labs that he wanted drawn.

## 2018-10-07 ENCOUNTER — Other Ambulatory Visit: Payer: Self-pay

## 2019-01-12 ENCOUNTER — Encounter: Payer: Self-pay | Admitting: Pain Medicine
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
## 2019-01-13 ENCOUNTER — Ambulatory Visit: Payer: Medicare Other | Attending: Pain Medicine | Admitting: Pain Medicine

## 2019-01-13 ENCOUNTER — Other Ambulatory Visit: Payer: Self-pay

## 2019-01-13 DIAGNOSIS — G894 Chronic pain syndrome: Secondary | ICD-10-CM

## 2019-01-13 DIAGNOSIS — M797 Fibromyalgia: Secondary | ICD-10-CM

## 2019-01-13 DIAGNOSIS — M545 Low back pain: Secondary | ICD-10-CM | POA: Diagnosis not present

## 2019-01-13 DIAGNOSIS — M47816 Spondylosis without myelopathy or radiculopathy, lumbar region: Secondary | ICD-10-CM | POA: Diagnosis not present

## 2019-01-13 DIAGNOSIS — M79605 Pain in left leg: Secondary | ICD-10-CM | POA: Diagnosis not present

## 2019-01-13 DIAGNOSIS — M7918 Myalgia, other site: Secondary | ICD-10-CM

## 2019-01-13 DIAGNOSIS — M62830 Muscle spasm of back: Secondary | ICD-10-CM

## 2019-01-13 DIAGNOSIS — M48062 Spinal stenosis, lumbar region with neurogenic claudication: Secondary | ICD-10-CM

## 2019-01-13 DIAGNOSIS — M4310 Spondylolisthesis, site unspecified: Secondary | ICD-10-CM

## 2019-01-13 DIAGNOSIS — M792 Neuralgia and neuritis, unspecified: Secondary | ICD-10-CM

## 2019-01-13 DIAGNOSIS — G8929 Other chronic pain: Secondary | ICD-10-CM

## 2019-01-13 MED ORDER — GABAPENTIN 300 MG PO CAPS: ORAL_CAPSULE | ORAL | 5 refills | Status: DC

## 2019-01-13 MED ORDER — OXYCODONE HCL 5 MG PO TABS: 5.0000 mg | ORAL_TABLET | Freq: Four times a day (QID) | ORAL | 0 refills | Status: DC | PRN

## 2019-01-13 MED ORDER — CYCLOBENZAPRINE HCL 10 MG PO TABS: 10.0000 mg | ORAL_TABLET | Freq: Three times a day (TID) | ORAL | 5 refills | Status: DC | PRN

## 2019-01-18 ENCOUNTER — Telehealth: Payer: Medicare Other | Admitting: Pain Medicine

## 2019-01-26 ENCOUNTER — Other Ambulatory Visit: Payer: Self-pay | Admitting: Specialist

## 2019-01-26 DIAGNOSIS — J849 Interstitial pulmonary disease, unspecified: Secondary | ICD-10-CM

## 2019-01-29 DIAGNOSIS — U071 COVID-19: Secondary | ICD-10-CM

## 2019-01-29 HISTORY — DX: COVID-19: U07.1

## 2019-02-03 ENCOUNTER — Ambulatory Visit: Payer: Medicare Other

## 2019-02-16 DIAGNOSIS — U071 COVID-19: Secondary | ICD-10-CM | POA: Insufficient documentation

## 2019-02-16 DIAGNOSIS — J9601 Acute respiratory failure with hypoxia: Secondary | ICD-10-CM | POA: Insufficient documentation

## 2019-02-16 DIAGNOSIS — J80 Acute respiratory distress syndrome: Secondary | ICD-10-CM | POA: Insufficient documentation

## 2019-02-17 ENCOUNTER — Ambulatory Visit: Payer: Medicare HMO

## 2019-02-26 DIAGNOSIS — E785 Hyperlipidemia, unspecified: Secondary | ICD-10-CM | POA: Insufficient documentation

## 2019-02-26 DIAGNOSIS — G629 Polyneuropathy, unspecified: Secondary | ICD-10-CM | POA: Insufficient documentation

## 2019-02-26 DIAGNOSIS — J1282 Pneumonia due to coronavirus disease 2019: Secondary | ICD-10-CM | POA: Insufficient documentation

## 2019-02-26 DIAGNOSIS — J440 Chronic obstructive pulmonary disease with acute lower respiratory infection: Secondary | ICD-10-CM | POA: Insufficient documentation

## 2019-02-26 DIAGNOSIS — I10 Essential (primary) hypertension: Secondary | ICD-10-CM

## 2019-02-26 DIAGNOSIS — R1312 Dysphagia, oropharyngeal phase: Secondary | ICD-10-CM

## 2019-02-26 DIAGNOSIS — M810 Age-related osteoporosis without current pathological fracture: Secondary | ICD-10-CM | POA: Insufficient documentation

## 2019-02-26 DIAGNOSIS — I2510 Atherosclerotic heart disease of native coronary artery without angina pectoris: Secondary | ICD-10-CM | POA: Insufficient documentation

## 2019-02-26 DIAGNOSIS — F329 Major depressive disorder, single episode, unspecified: Secondary | ICD-10-CM | POA: Insufficient documentation

## 2019-03-01 ENCOUNTER — Emergency Department: Payer: Medicare Other

## 2019-03-01 ENCOUNTER — Other Ambulatory Visit: Payer: Self-pay

## 2019-03-01 ENCOUNTER — Inpatient Hospital Stay
Admission: EM | Admit: 2019-03-01 | Discharge: 2019-03-05 | DRG: 071 | Disposition: A | Discharge status: Rehabilitation Facility | Payer: Medicare Other | Attending: Internal Medicine | Admitting: Internal Medicine

## 2019-03-01 DIAGNOSIS — M545 Low back pain: Secondary | ICD-10-CM | POA: Diagnosis present

## 2019-03-01 DIAGNOSIS — K5900 Constipation, unspecified: Secondary | ICD-10-CM | POA: Diagnosis present

## 2019-03-01 DIAGNOSIS — G4700 Insomnia, unspecified: Secondary | ICD-10-CM | POA: Diagnosis present

## 2019-03-01 DIAGNOSIS — G9341 Metabolic encephalopathy: Secondary | ICD-10-CM

## 2019-03-01 DIAGNOSIS — F329 Major depressive disorder, single episode, unspecified: Secondary | ICD-10-CM | POA: Diagnosis not present

## 2019-03-01 DIAGNOSIS — F419 Anxiety disorder, unspecified: Secondary | ICD-10-CM | POA: Diagnosis present

## 2019-03-01 DIAGNOSIS — M1611 Unilateral primary osteoarthritis, right hip: Secondary | ICD-10-CM | POA: Diagnosis present

## 2019-03-01 DIAGNOSIS — E8809 Other disorders of plasma-protein metabolism, not elsewhere classified: Secondary | ICD-10-CM | POA: Diagnosis not present

## 2019-03-01 DIAGNOSIS — D649 Anemia, unspecified: Secondary | ICD-10-CM | POA: Diagnosis not present

## 2019-03-01 DIAGNOSIS — Z888 Allergy status to other drugs, medicaments and biological substances status: Secondary | ICD-10-CM | POA: Diagnosis not present

## 2019-03-01 DIAGNOSIS — M810 Age-related osteoporosis without current pathological fracture: Secondary | ICD-10-CM | POA: Diagnosis present

## 2019-03-01 DIAGNOSIS — R404 Transient alteration of awareness: Secondary | ICD-10-CM | POA: Diagnosis present

## 2019-03-01 DIAGNOSIS — J45909 Unspecified asthma, uncomplicated: Secondary | ICD-10-CM | POA: Diagnosis present

## 2019-03-01 DIAGNOSIS — Z880 Allergy status to penicillin: Secondary | ICD-10-CM | POA: Diagnosis not present

## 2019-03-01 DIAGNOSIS — M109 Gout, unspecified: Secondary | ICD-10-CM | POA: Diagnosis present

## 2019-03-01 DIAGNOSIS — Z7983 Long term (current) use of bisphosphonates: Secondary | ICD-10-CM

## 2019-03-01 DIAGNOSIS — M48062 Spinal stenosis, lumbar region with neurogenic claudication: Secondary | ICD-10-CM | POA: Diagnosis present

## 2019-03-01 DIAGNOSIS — R4182 Altered mental status, unspecified: Secondary | ICD-10-CM | POA: Diagnosis not present

## 2019-03-01 DIAGNOSIS — G8194 Hemiplegia, unspecified affecting left nondominant side: Secondary | ICD-10-CM | POA: Diagnosis present

## 2019-03-01 DIAGNOSIS — D72829 Elevated white blood cell count, unspecified: Secondary | ICD-10-CM

## 2019-03-01 DIAGNOSIS — R630 Anorexia: Secondary | ICD-10-CM | POA: Diagnosis present

## 2019-03-01 DIAGNOSIS — R29898 Other symptoms and signs involving the musculoskeletal system: Secondary | ICD-10-CM

## 2019-03-01 DIAGNOSIS — R402140 Coma scale, eyes open, spontaneous, unspecified time: Secondary | ICD-10-CM | POA: Diagnosis present

## 2019-03-01 DIAGNOSIS — Z87440 Personal history of urinary (tract) infections: Secondary | ICD-10-CM

## 2019-03-01 DIAGNOSIS — R531 Weakness: Secondary | ICD-10-CM

## 2019-03-01 DIAGNOSIS — Z885 Allergy status to narcotic agent status: Secondary | ICD-10-CM

## 2019-03-01 DIAGNOSIS — D638 Anemia in other chronic diseases classified elsewhere: Secondary | ICD-10-CM | POA: Diagnosis not present

## 2019-03-01 DIAGNOSIS — L8990 Pressure ulcer of unspecified site, unspecified stage: Secondary | ICD-10-CM | POA: Insufficient documentation

## 2019-03-01 DIAGNOSIS — Z9013 Acquired absence of bilateral breasts and nipples: Secondary | ICD-10-CM

## 2019-03-01 DIAGNOSIS — Z9682 Presence of neurostimulator: Secondary | ICD-10-CM

## 2019-03-01 DIAGNOSIS — R509 Fever, unspecified: Secondary | ICD-10-CM | POA: Diagnosis not present

## 2019-03-01 DIAGNOSIS — G43909 Migraine, unspecified, not intractable, without status migrainosus: Secondary | ICD-10-CM | POA: Diagnosis not present

## 2019-03-01 DIAGNOSIS — R159 Full incontinence of feces: Secondary | ICD-10-CM | POA: Diagnosis not present

## 2019-03-01 DIAGNOSIS — E669 Obesity, unspecified: Secondary | ICD-10-CM | POA: Diagnosis present

## 2019-03-01 DIAGNOSIS — E46 Unspecified protein-calorie malnutrition: Secondary | ICD-10-CM | POA: Diagnosis not present

## 2019-03-01 DIAGNOSIS — M961 Postlaminectomy syndrome, not elsewhere classified: Secondary | ICD-10-CM | POA: Diagnosis not present

## 2019-03-01 DIAGNOSIS — G894 Chronic pain syndrome: Secondary | ICD-10-CM | POA: Diagnosis present

## 2019-03-01 DIAGNOSIS — Z6831 Body mass index (BMI) 31.0-31.9, adult: Secondary | ICD-10-CM

## 2019-03-01 DIAGNOSIS — E860 Dehydration: Secondary | ICD-10-CM | POA: Diagnosis not present

## 2019-03-01 DIAGNOSIS — R0989 Other specified symptoms and signs involving the circulatory and respiratory systems: Secondary | ICD-10-CM | POA: Diagnosis not present

## 2019-03-01 DIAGNOSIS — E876 Hypokalemia: Secondary | ICD-10-CM

## 2019-03-01 DIAGNOSIS — B370 Candidal stomatitis: Secondary | ICD-10-CM | POA: Diagnosis not present

## 2019-03-01 DIAGNOSIS — Z8616 Personal history of COVID-19: Secondary | ICD-10-CM

## 2019-03-01 DIAGNOSIS — R1310 Dysphagia, unspecified: Secondary | ICD-10-CM | POA: Diagnosis not present

## 2019-03-01 DIAGNOSIS — R197 Diarrhea, unspecified: Secondary | ICD-10-CM | POA: Diagnosis not present

## 2019-03-01 DIAGNOSIS — E785 Hyperlipidemia, unspecified: Secondary | ICD-10-CM | POA: Diagnosis present

## 2019-03-01 DIAGNOSIS — E870 Hyperosmolality and hypernatremia: Secondary | ICD-10-CM | POA: Diagnosis not present

## 2019-03-01 DIAGNOSIS — E861 Hypovolemia: Secondary | ICD-10-CM | POA: Diagnosis present

## 2019-03-01 DIAGNOSIS — H04129 Dry eye syndrome of unspecified lacrimal gland: Secondary | ICD-10-CM | POA: Diagnosis present

## 2019-03-01 DIAGNOSIS — R402350 Coma scale, best motor response, localizes pain, unspecified time: Secondary | ICD-10-CM | POA: Diagnosis present

## 2019-03-01 DIAGNOSIS — L89151 Pressure ulcer of sacral region, stage 1: Secondary | ICD-10-CM | POA: Diagnosis not present

## 2019-03-01 DIAGNOSIS — R402230 Coma scale, best verbal response, inappropriate words, unspecified time: Secondary | ICD-10-CM | POA: Diagnosis present

## 2019-03-01 DIAGNOSIS — I10 Essential (primary) hypertension: Secondary | ICD-10-CM | POA: Diagnosis not present

## 2019-03-01 DIAGNOSIS — Z79899 Other long term (current) drug therapy: Secondary | ICD-10-CM | POA: Diagnosis present

## 2019-03-01 DIAGNOSIS — Z8249 Family history of ischemic heart disease and other diseases of the circulatory system: Secondary | ICD-10-CM

## 2019-03-01 DIAGNOSIS — Z7982 Long term (current) use of aspirin: Secondary | ICD-10-CM

## 2019-03-01 DIAGNOSIS — B379 Candidiasis, unspecified: Secondary | ICD-10-CM | POA: Diagnosis present

## 2019-03-01 DIAGNOSIS — K219 Gastro-esophageal reflux disease without esophagitis: Secondary | ICD-10-CM | POA: Diagnosis present

## 2019-03-01 DIAGNOSIS — R109 Unspecified abdominal pain: Secondary | ICD-10-CM

## 2019-03-01 DIAGNOSIS — Z87891 Personal history of nicotine dependence: Secondary | ICD-10-CM

## 2019-03-01 DIAGNOSIS — M62838 Other muscle spasm: Secondary | ICD-10-CM | POA: Diagnosis present

## 2019-03-01 DIAGNOSIS — E871 Hypo-osmolality and hyponatremia: Secondary | ICD-10-CM | POA: Diagnosis not present

## 2019-03-01 DIAGNOSIS — Z803 Family history of malignant neoplasm of breast: Secondary | ICD-10-CM

## 2019-03-01 DIAGNOSIS — Z969 Presence of functional implant, unspecified: Secondary | ICD-10-CM | POA: Diagnosis not present

## 2019-03-01 DIAGNOSIS — Z201 Contact with and (suspected) exposure to tuberculosis: Secondary | ICD-10-CM

## 2019-03-01 DIAGNOSIS — N6019 Diffuse cystic mastopathy of unspecified breast: Secondary | ICD-10-CM | POA: Diagnosis present

## 2019-03-01 LAB — COMPREHENSIVE METABOLIC PANEL
ALT: 28 U/L (ref 0–44)
AST: 15 U/L (ref 15–41)
Albumin: 2.5 g/dL — ABNORMAL LOW (ref 3.5–5.0)
Alkaline Phosphatase: 54 U/L (ref 38–126)
Anion gap: 9 (ref 5–15)
BUN: 16 mg/dL (ref 8–23)
CO2: 26 mmol/L (ref 22–32)
Calcium: 8.8 mg/dL — ABNORMAL LOW (ref 8.9–10.3)
Chloride: 114 mmol/L — ABNORMAL HIGH (ref 98–111)
Creatinine, Ser: 0.59 mg/dL (ref 0.44–1.00)
GFR calc Af Amer: >60 mL/min (ref >60)
GFR calc non Af Amer: >60 mL/min (ref >60)
Glucose, Bld: 147 mg/dL — ABNORMAL HIGH (ref 70–99)
Potassium: 3.2 mmol/L — ABNORMAL LOW (ref 3.5–5.1)
Sodium: 149 mmol/L — ABNORMAL HIGH (ref 135–145)
Total Bilirubin: 0.7 mg/dL (ref 0.3–1.2)
Total Protein: 6.9 g/dL (ref 6.5–8.1)

## 2019-03-01 LAB — CBC WITH DIFFERENTIAL/PLATELET
Abs Immature Granulocytes: 0.17 10*3/uL — ABNORMAL HIGH (ref 0.00–0.07)
Basophils Absolute: 0 10*3/uL (ref 0.0–0.1)
Basophils Relative: 0 %
Eosinophils Absolute: 0.1 10*3/uL (ref 0.0–0.5)
Eosinophils Relative: 0 %
HCT: 34.3 % — ABNORMAL LOW (ref 36.0–46.0)
Hemoglobin: 10.5 g/dL — ABNORMAL LOW (ref 12.0–15.0)
Immature Granulocytes: 1 %
Lymphocytes Relative: 6 %
Lymphs Abs: 1.2 10*3/uL (ref 0.7–4.0)
MCH: 28.2 pg (ref 26.0–34.0)
MCHC: 30.6 g/dL (ref 30.0–36.0)
MCV: 92.2 fL (ref 80.0–100.0)
Monocytes Absolute: 1.4 10*3/uL — ABNORMAL HIGH (ref 0.1–1.0)
Monocytes Relative: 7 %
Neutro Abs: 17.3 10*3/uL — ABNORMAL HIGH (ref 1.7–7.7)
Neutrophils Relative %: 86 %
Platelets: 357 10*3/uL (ref 150–400)
RBC: 3.72 MIL/uL — ABNORMAL LOW (ref 3.87–5.11)
RDW: 16.8 % — ABNORMAL HIGH (ref 11.5–15.5)
WBC: 20.2 10*3/uL — ABNORMAL HIGH (ref 4.0–10.5)
nRBC: 0 % (ref 0.0–0.2)

## 2019-03-01 LAB — TROPONIN I (HIGH SENSITIVITY)
Troponin I (High Sensitivity): 34 ng/L — ABNORMAL HIGH (ref <18)
Troponin I (High Sensitivity): 31 ng/L — ABNORMAL HIGH (ref <18)
Troponin I (High Sensitivity): 28 ng/L — ABNORMAL HIGH (ref <18)

## 2019-03-01 LAB — CBC
HCT: 33.3 % — ABNORMAL LOW (ref 36.0–46.0)
Hemoglobin: 10.4 g/dL — ABNORMAL LOW (ref 12.0–15.0)
MCH: 28.7 pg (ref 26.0–34.0)
MCHC: 31.2 g/dL (ref 30.0–36.0)
MCV: 91.7 fL (ref 80.0–100.0)
Platelets: 312 10*3/uL (ref 150–400)
RBC: 3.63 MIL/uL — ABNORMAL LOW (ref 3.87–5.11)
RDW: 16.7 % — ABNORMAL HIGH (ref 11.5–15.5)
WBC: 19.2 10*3/uL — ABNORMAL HIGH (ref 4.0–10.5)
nRBC: 0 % (ref 0.0–0.2)

## 2019-03-01 LAB — URINALYSIS, COMPLETE (UACMP) WITH MICROSCOPIC
Bacteria, UA: NONE SEEN
Bilirubin Urine: NEGATIVE
Glucose, UA: NEGATIVE mg/dL
Hgb urine dipstick: NEGATIVE
Ketones, ur: NEGATIVE mg/dL
Nitrite: NEGATIVE
Protein, ur: NEGATIVE mg/dL
Specific Gravity, Urine: 1.016 (ref 1.005–1.030)
Squamous Epithelial / HPF: NONE SEEN (ref 0–5)
pH: 6 (ref 5.0–8.0)

## 2019-03-01 LAB — GLUCOSE, CAPILLARY: Glucose-Capillary: 122 mg/dL — ABNORMAL HIGH (ref 70–99)

## 2019-03-01 LAB — LACTIC ACID, PLASMA: Lactic Acid, Venous: 1 mmol/L (ref 0.5–1.9)

## 2019-03-01 LAB — HEMOGLOBIN A1C
Hgb A1c MFr Bld: 5.5 % (ref 4.8–5.6)
Mean Plasma Glucose: 111.15 mg/dL

## 2019-03-01 LAB — CREATININE, SERUM
Creatinine, Ser: 0.35 mg/dL — ABNORMAL LOW (ref 0.44–1.00)
GFR calc Af Amer: >60 mL/min (ref >60)
GFR calc non Af Amer: >60 mL/min (ref >60)

## 2019-03-01 LAB — TSH: TSH: 0.065 u[IU]/mL — ABNORMAL LOW (ref 0.350–4.500)

## 2019-03-01 MED ORDER — LACTATED RINGERS IV BOLUS
1000.0000 mL | Freq: Once | INTRAVENOUS | Status: AC
  Administered 2019-03-01: 1000 mL via INTRAVENOUS

## 2019-03-01 MED ORDER — DEXTROSE 5 % IV SOLN: Freq: Once | INTRAVENOUS | Status: AC

## 2019-03-01 MED ORDER — ENOXAPARIN SODIUM 40 MG/0.4ML ~~LOC~~ SOLN
40.0000 mg | SUBCUTANEOUS | Status: DC
  Administered 2019-03-01 – 2019-03-04 (×4): 40 mg via SUBCUTANEOUS
  Filled 2019-03-01 (×4): qty 0.4

## 2019-03-01 MED ORDER — FLUTICASONE FUROATE-VILANTEROL 200-25 MCG/INH IN AEPB
1.0000 | INHALATION_SPRAY | Freq: Every day | RESPIRATORY_TRACT | Status: DC
  Administered 2019-03-04 – 2019-03-05 (×2): 1 via RESPIRATORY_TRACT
  Filled 2019-03-01: qty 28

## 2019-03-01 MED ORDER — SENNA 8.6 MG PO TABS: 2.0000 | ORAL_TABLET | Freq: Every day | ORAL | Status: DC | PRN

## 2019-03-01 MED ORDER — CALCIUM CARBONATE ANTACID 500 MG PO CHEW: 1.0000 | CHEWABLE_TABLET | Freq: Three times a day (TID) | ORAL | Status: DC | PRN

## 2019-03-01 MED ORDER — CITALOPRAM HYDROBROMIDE 20 MG PO TABS
20.0000 mg | ORAL_TABLET | Freq: Every day | ORAL | Status: DC
  Administered 2019-03-02 – 2019-03-05 (×4): 20 mg via ORAL
  Filled 2019-03-01 (×4): qty 1

## 2019-03-01 MED ORDER — VITAMIN B-12 1000 MCG PO TABS
1000.0000 ug | ORAL_TABLET | Freq: Every day | ORAL | Status: DC
  Administered 2019-03-03 – 2019-03-05 (×3): 1000 ug via ORAL
  Filled 2019-03-01 (×3): qty 1

## 2019-03-01 MED ORDER — ACETAMINOPHEN 325 MG PO TABS
650.0000 mg | ORAL_TABLET | Freq: Four times a day (QID) | ORAL | Status: DC | PRN
  Administered 2019-03-05: 650 mg via ORAL
  Filled 2019-03-01: qty 2

## 2019-03-01 MED ORDER — ROPINIROLE HCL 1 MG PO TABS
1.0000 mg | ORAL_TABLET | Freq: Two times a day (BID) | ORAL | Status: DC
  Administered 2019-03-02 – 2019-03-05 (×6): 1 mg via ORAL
  Filled 2019-03-01 (×6): qty 1

## 2019-03-01 MED ORDER — MELATONIN 5 MG PO TABS
5.0000 mg | ORAL_TABLET | Freq: Every day | ORAL | Status: DC
  Administered 2019-03-02 – 2019-03-04 (×3): 5 mg via ORAL
  Filled 2019-03-01 (×3): qty 1

## 2019-03-01 MED ORDER — ALENDRONATE SODIUM 70 MG PO TABS: 70.0000 mg | ORAL_TABLET | ORAL | Status: DC

## 2019-03-01 MED ORDER — ASPIRIN 81 MG PO CHEW
81.0000 mg | CHEWABLE_TABLET | Freq: Every day | ORAL | Status: DC
  Administered 2019-03-02 – 2019-03-05 (×4): 81 mg via ORAL
  Filled 2019-03-01 (×4): qty 1

## 2019-03-01 MED ORDER — ATENOLOL 25 MG PO TABS
50.0000 mg | ORAL_TABLET | Freq: Every day | ORAL | Status: DC
  Administered 2019-03-02 – 2019-03-05 (×4): 50 mg via ORAL
  Filled 2019-03-01 (×5): qty 2

## 2019-03-01 MED ORDER — FENOFIBRATE 160 MG PO TABS
160.0000 mg | ORAL_TABLET | Freq: Every day | ORAL | Status: DC
  Administered 2019-03-03 – 2019-03-05 (×3): 160 mg via ORAL
  Filled 2019-03-01 (×5): qty 1

## 2019-03-01 MED ORDER — DEXTROSE 5 % IV SOLN: INTRAVENOUS | Status: DC

## 2019-03-01 MED ORDER — ZINC SULFATE 220 (50 ZN) MG PO CAPS
220.0000 mg | ORAL_CAPSULE | Freq: Every day | ORAL | Status: DC
  Administered 2019-03-03 – 2019-03-05 (×3): 220 mg via ORAL
  Filled 2019-03-01 (×3): qty 1

## 2019-03-01 MED ORDER — MONTELUKAST SODIUM 10 MG PO TABS
10.0000 mg | ORAL_TABLET | Freq: Every day | ORAL | Status: DC
  Administered 2019-03-02 – 2019-03-04 (×3): 10 mg via ORAL
  Filled 2019-03-01 (×3): qty 1

## 2019-03-01 MED ORDER — VITAMIN D 25 MCG (1000 UNIT) PO TABS
5000.0000 [IU] | ORAL_TABLET | Freq: Every day | ORAL | Status: DC
  Administered 2019-03-03 – 2019-03-05 (×3): 5000 [IU] via ORAL
  Filled 2019-03-01 (×3): qty 5

## 2019-03-01 MED ORDER — POLYETHYLENE GLYCOL 3350 17 G PO PACK
17.0000 g | PACK | Freq: Two times a day (BID) | ORAL | Status: DC
  Filled 2019-03-01: qty 1

## 2019-03-01 MED ORDER — FUROSEMIDE 20 MG PO TABS
20.0000 mg | ORAL_TABLET | Freq: Every day | ORAL | Status: DC
  Administered 2019-03-02 – 2019-03-03 (×2): 20 mg via ORAL
  Filled 2019-03-01 (×2): qty 1

## 2019-03-01 MED ORDER — NALOXONE HCL 4 MG/0.1ML NA LIQD: 0.4000 mg | Freq: Once | NASAL | Status: DC

## 2019-03-01 MED ORDER — ALBUTEROL SULFATE (2.5 MG/3ML) 0.083% IN NEBU: 2.5000 mg | INHALATION_SOLUTION | RESPIRATORY_TRACT | Status: DC | PRN
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
## 2019-03-01 NOTE — ED Notes (Signed)
Report given patient to unit via RN and tech. S/o to follows.
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
## 2019-03-01 NOTE — ED Triage Notes (Signed)
Patient from [facility] with c/o of AMS. Patient non verbal upon arrival.

## 2019-03-01 NOTE — ED Notes (Signed)
First attempt to call report, nurse unable to take at present time.

## 2019-03-01 NOTE — Progress Notes (Signed)
Patient arrives from ED at this time via stretch to room 258 accompanied by spouse.

## 2019-03-01 NOTE — ED Notes (Signed)
Patient continues to be non-verbal, s/o at bedside patient holding his hand. Patient follows command when asked to do something just not verbalizing needs. bld cx's x 2 and lactate sent.

## 2019-03-01 NOTE — Progress Notes (Signed)
PHARMACIST - PHYSICIAN COMMUNICATION  CONCERNING: P&T Medication Policy Regarding Oral Bisphosphonates  RECOMMENDATION: Your order for alendronate (Fosamax), ibandronate (Boniva), or risedronate (Actonel) has been discontinued at this time.  If the patient's post-hospital medical condition warrants safe use of this class of drugs, please resume the pre-hospital regimen upon discharge.  DESCRIPTION:  Alendronate (Fosamax), ibandronate (Boniva), and risedronate (Actonel) can cause severe esophageal erosions in patients who are unable to remain upright at least 30 minutes after taking this medication.   Since brief interruptions in therapy are thought to have minimal impact on bone mineral density, the [facility] has established that bisphosphonate orders should be routinely discontinued during hospitalization.   To override this safety policy and permit administration of Boniva, Fosamax, or Actonel in the hospital, prescribers must write "DO NOT HOLD" in the comments section when placing the order for this class of medications.   Dorena Bodo, PharmD

## 2019-03-02 DIAGNOSIS — E860 Dehydration: Secondary | ICD-10-CM | POA: Diagnosis not present

## 2019-03-02 DIAGNOSIS — R4182 Altered mental status, unspecified: Secondary | ICD-10-CM | POA: Diagnosis not present

## 2019-03-02 DIAGNOSIS — G9341 Metabolic encephalopathy: Secondary | ICD-10-CM | POA: Diagnosis not present

## 2019-03-02 DIAGNOSIS — R404 Transient alteration of awareness: Secondary | ICD-10-CM | POA: Diagnosis not present

## 2019-03-02 LAB — URINE DRUG SCREEN, QUALITATIVE (ARMC ONLY)
Amphetamines, Ur Screen: NOT DETECTED
Barbiturates, Ur Screen: NOT DETECTED
Benzodiazepine, Ur Scrn: NOT DETECTED
Cannabinoid 50 Ng, Ur ~~LOC~~: NOT DETECTED
Cocaine Metabolite,Ur ~~LOC~~: NOT DETECTED
MDMA (Ecstasy)Ur Screen: POSITIVE — AB
Methadone Scn, Ur: NOT DETECTED
Opiate, Ur Screen: NOT DETECTED
Phencyclidine (PCP) Ur S: NOT DETECTED
Tricyclic, Ur Screen: POSITIVE — AB

## 2019-03-02 LAB — BASIC METABOLIC PANEL
Anion gap: 11 (ref 5–15)
BUN: 16 mg/dL (ref 8–23)
CO2: 24 mmol/L (ref 22–32)
Calcium: 8.4 mg/dL — ABNORMAL LOW (ref 8.9–10.3)
Chloride: 113 mmol/L — ABNORMAL HIGH (ref 98–111)
Creatinine, Ser: 0.41 mg/dL — ABNORMAL LOW (ref 0.44–1.00)
GFR calc Af Amer: >60 mL/min (ref >60)
GFR calc non Af Amer: >60 mL/min (ref >60)
Glucose, Bld: 144 mg/dL — ABNORMAL HIGH (ref 70–99)
Potassium: 2.8 mmol/L — ABNORMAL LOW (ref 3.5–5.1)
Sodium: 148 mmol/L — ABNORMAL HIGH (ref 135–145)

## 2019-03-02 LAB — CBC
HCT: 31.4 % — ABNORMAL LOW (ref 36.0–46.0)
Hemoglobin: 9.7 g/dL — ABNORMAL LOW (ref 12.0–15.0)
MCH: 28.3 pg (ref 26.0–34.0)
MCHC: 30.9 g/dL (ref 30.0–36.0)
MCV: 91.5 fL (ref 80.0–100.0)
Platelets: 297 10*3/uL (ref 150–400)
RBC: 3.43 MIL/uL — ABNORMAL LOW (ref 3.87–5.11)
RDW: 16.5 % — ABNORMAL HIGH (ref 11.5–15.5)
WBC: 16.2 10*3/uL — ABNORMAL HIGH (ref 4.0–10.5)
nRBC: 0 % (ref 0.0–0.2)

## 2019-03-02 LAB — POTASSIUM: Potassium: 3.3 mmol/L — ABNORMAL LOW (ref 3.5–5.1)

## 2019-03-02 LAB — MAGNESIUM: Magnesium: 1.6 mg/dL — ABNORMAL LOW (ref 1.7–2.4)

## 2019-03-02 LAB — AMMONIA: Ammonia: 17 umol/L (ref 9–35)

## 2019-03-02 MED ORDER — POTASSIUM CHLORIDE CRYS ER 20 MEQ PO TBCR: 40.0000 meq | EXTENDED_RELEASE_TABLET | Freq: Once | ORAL | Status: DC

## 2019-03-02 MED ORDER — POTASSIUM CHLORIDE 10 MEQ/100ML IV SOLN
10.0000 meq | INTRAVENOUS | Status: AC
  Administered 2019-03-02 (×4): 10 meq via INTRAVENOUS
  Filled 2019-03-02 (×4): qty 100

## 2019-03-02 MED ORDER — POTASSIUM CL IN DEXTROSE 5% 20 MEQ/L IV SOLN
20.0000 meq | INTRAVENOUS | Status: DC
  Administered 2019-03-02 – 2019-03-03 (×2): 20 meq via INTRAVENOUS
  Filled 2019-03-02 (×4): qty 1000

## 2019-03-02 NOTE — Progress Notes (Addendum)
Subjective: Much improved this morning.  Was responding verbally with few words.  Alert and oriented to person and place.  Objective: Vital signs in last 24 hours: Temp:  [97.6 F (36.4 C)-100.6 F (38.1 C)] 100.6 F (38.1 C) (02/02 1154) Pulse Rate:  [72-104] 84 (02/02 1531) Resp:  [16-21] 18 (02/02 1531) BP: (129-164)/(58-77) 129/58 (02/02 1531) SpO2:  [92 %-100 %] 95 % (02/02 1531) Weight:  [74.6 kg] 74.6 kg (02/01 2142)  Intake/Output from previous day: 02/01 0701 - 02/02 0700 In: 2134.3 [I.V.:1134.3] Out: -  Intake/Output this shift: No intake/output data recorded.   Constitutional: No acute distress.  Alert and oriented to place and person.  eyes: Conjunctivae are normal. Normal extraocular movements. ENT Head: Normocephalic and atraumatic. Nose: No congestion/rhinnorhea. Mouth/Throat: Mucous membranes are moist. Neck: No stridor. Cardiovascular: Regular rate, regular rhythm. No murmurs, rubs, or gallops. Respiratory: Normal respiratory effort without tachypnea nor retractions. Breath sounds are clear and equal bilaterally. No wheezes/rales/rhonchi. Gastrointestinal: Soft and nontender. Normal bowel sounds Musculoskeletal: Nontender with normal range of motion in extremities. No lower extremity tenderness nor edema. Neurologic:  No gross focal neurologic deficits are appreciated.   -Able to squeeze fingers both hands.  Grip felt weaker on left hand than the right hand.  Generalized weakness present.  Sensation intact as she responded to painful stimuli in all 4 extremities.  Reflexes also present appropriately. Skin: Skin is warm, dry and intact. No rash noted. Psychiatric:Flat affect.   Results for orders placed or performed during the hospital encounter of 03/01/19 (from the past 24 hour(s))  CBC     Status: Abnormal   Collection Time: 03/01/19  4:56 PM  Result Value Ref Range   WBC 19.2 (H) 4.0 - 10.5 K/uL   RBC 3.63 (L) 3.87 - 5.11 MIL/uL    Hemoglobin 10.4 (L) 12.0 - 15.0 g/dL   HCT 33.3 (L) 36.0 - 46.0 %   MCV 91.7 80.0 - 100.0 fL   MCH 28.7 26.0 - 34.0 pg   MCHC 31.2 30.0 - 36.0 g/dL   RDW 16.7 (H) 11.5 - 15.5 %   Platelets 312 150 - 400 K/uL   nRBC 0.0 0.0 - 0.2 %  Creatinine, serum     Status: Abnormal   Collection Time: 03/01/19  4:56 PM  Result Value Ref Range   Creatinine, Ser 0.35 (L) 0.44 - 1.00 mg/dL   GFR calc non Af Amer >60 >60 mL/min   GFR calc Af Amer >60 >60 mL/min  TSH     Status: Abnormal   Collection Time: 03/01/19  4:56 PM  Result Value Ref Range   TSH 0.065 (L) 0.350 - 4.500 uIU/mL  Hemoglobin A1c     Status: None   Collection Time: 03/01/19  4:56 PM  Result Value Ref Range   Hgb A1c MFr Bld 5.5 4.8 - 5.6 %   Mean Plasma Glucose 111.15 mg/dL  Troponin I (High Sensitivity)     Status: Abnormal   Collection Time: 03/01/19  4:56 PM  Result Value Ref Range   Troponin I (High Sensitivity) 31 (H) <18 ng/L  Troponin I (High Sensitivity)     Status: Abnormal   Collection Time: 03/01/19  6:48 PM  Result Value Ref Range   Troponin I (High Sensitivity) 28 (H) <18 ng/L  Basic metabolic panel     Status: Abnormal   Collection Time: 03/02/19  5:59 AM  Result Value Ref Range   Sodium 148 (H) 135 - 145 mmol/L   Potassium  2.8 (L) 3.5 - 5.1 mmol/L   Chloride 113 (H) 98 - 111 mmol/L   CO2 24 22 - 32 mmol/L   Glucose, Bld 144 (H) 70 - 99 mg/dL   BUN 16 8 - 23 mg/dL   Creatinine, Ser 0.41 (L) 0.44 - 1.00 mg/dL   Calcium 8.4 (L) 8.9 - 10.3 mg/dL   GFR calc non Af Amer >60 >60 mL/min   GFR calc Af Amer >60 >60 mL/min   Anion gap 11 5 - 15  CBC     Status: Abnormal   Collection Time: 03/02/19  5:59 AM  Result Value Ref Range   WBC 16.2 (H) 4.0 - 10.5 K/uL   RBC 3.43 (L) 3.87 - 5.11 MIL/uL   Hemoglobin 9.7 (L) 12.0 - 15.0 g/dL   HCT 31.4 (L) 36.0 - 46.0 %   MCV 91.5 80.0 - 100.0 fL   MCH 28.3 26.0 - 34.0 pg   MCHC 30.9 30.0 - 36.0 g/dL   RDW 16.5 (H) 11.5 - 15.5 %   Platelets 297 150 - 400 K/uL    nRBC 0.0 0.0 - 0.2 %  Ammonia     Status: None   Collection Time: 03/02/19  9:09 AM  Result Value Ref Range   Ammonia 17 9 - 35 umol/L  Magnesium     Status: Abnormal   Collection Time: 03/02/19  9:09 AM  Result Value Ref Range   Magnesium 1.6 (L) 1.7 - 2.4 mg/dL  Urine Drug Screen, Qualitative ([facility] only)     Status: Abnormal   Collection Time: 03/02/19 10:12 AM  Result Value Ref Range   Tricyclic, Ur Screen POSITIVE (A) NONE DETECTED   Amphetamines, Ur Screen NONE DETECTED NONE DETECTED   MDMA (Ecstasy)Ur Screen POSITIVE (A) NONE DETECTED   Cocaine Metabolite,Ur [place] NONE DETECTED NONE DETECTED   Opiate, Ur Screen NONE DETECTED NONE DETECTED   Phencyclidine (PCP) Ur S NONE DETECTED NONE DETECTED   Cannabinoid 50 Ng, Ur [place] NONE DETECTED NONE DETECTED   Barbiturates, Ur Screen NONE DETECTED NONE DETECTED   Benzodiazepine, Ur Scrn NONE DETECTED NONE DETECTED   Methadone Scn, Ur NONE DETECTED NONE DETECTED  Potassium     Status: Abnormal   Collection Time: 03/02/19  2:57 PM  Result Value Ref Range   Potassium 3.3 (L) 3.5 - 5.1 mmol/L    Studies/Results: DG Chest 1 View  Result Date: 03/01/2019 CLINICAL DATA:  Altered mental status. EXAM: CHEST  1 VIEW COMPARISON:  Single-view of the chest 02/14/2019 and 02/02/2019. FINDINGS: Bilateral airspace disease persists but has markedly improved since the most recent examination. No pneumothorax or pleural effusion. Heart size is normal. Atherosclerosis noted. Spinal stimulator is in place. IMPRESSION: Bilateral airspace disease persists but is markedly improved compared to the most recent exam. Electronically Signed   By: Inge Rise M.D.   On: 03/01/2019 14:16   CT Head Wo Contrast  Result Date: 03/01/2019 CLINICAL DATA:  Altered mental status. EXAM: CT HEAD WITHOUT CONTRAST TECHNIQUE: Contiguous axial images were obtained from the base of the skull through the vertex without intravenous contrast. COMPARISON:  Jun 22, 2010 FINDINGS: Brain:  No evidence of acute infarction, hemorrhage, hydrocephalus, extra-axial collection or mass lesion/mass effect. There is chronic diffuse atrophy. Chronic bilateral periventricular white matter small vessel ischemic changes identified. Vascular: No hyperdense vessel is noted. Skull: Normal. Negative for fracture or focal lesion. Sinuses/Orbits: No acute finding. Other: None. IMPRESSION: No focal acute intracranial abnormality identified. Chronic diffuse atrophy. Chronic bilateral periventricular  white matter small vessel ischemic change. Electronically Signed   By: Abelardo Diesel M.D.   On: 03/01/2019 14:50    Scheduled Meds: . aspirin  81 mg Oral Daily  . atenolol  50 mg Oral Daily  . cholecalciferol  5,000 Units Oral Daily  . citalopram  20 mg Oral Daily  . enoxaparin (LOVENOX) injection  40 mg Subcutaneous Q24H  . fenofibrate  160 mg Oral Daily  . fluticasone furoate-vilanterol  1 puff Inhalation Daily  . furosemide  20 mg Oral Daily  . Melatonin  5 mg Oral QHS  . montelukast  10 mg Oral QHS  . polyethylene glycol  17 g Oral BID  . rOPINIRole  1 mg Oral BID  . vitamin B-12  1,000 mcg Oral Daily  . zinc sulfate  220 mg Oral Daily   Continuous Infusions: . dextrose 5 % with KCl 20 mEq / L 20 mEq (03/02/19 1310)   PRN Meds:acetaminophen, albuterol, calcium carbonate, senna  Assessment/Plan:  Altered level of consciousness:  Much improved today  - Per record patient seems to have experienced altered mental status while she was at the [facility] care. It is unsure if patient is acutely worsen or it is the continuation -Suspected to be post ICU delirium in the setting of acute illness. Could also be from polypharmacy as patient's home medication record showing she was on multiple narcotics, gabapentin's as well as several potential CNS suppressants.  Or post Covid phenomena/complication. -Also could be due to hypernatremia, and found to be 149 -UDS to be checked; still pending -Troponin to be  followed up although EKG shows no acute changes -Patient's WBC found to be in 20s which is higher than when she was discharged from the [facility] -Check urine and blood culture --pending so far no growth -X-ray shows no obvious consolidation or infiltrate and appears much improved compared to her last chest x-ray from the [facility] -CT head negative -Continue to hold all narcotics and potential CNS suppressants at this time; also patient is improving without them and has not been complaining of pain so far -Per record husband feels that part of her decreased desire to eat may also be because she does not want to soil herself. -Passed swallow evaluation; diet as per speech therapist -PT OT also found focal weakness, left upper extremity and questionable hemispatial neglect.  These were also noticed during admission as well.  But unsure how old these findings are.  -Initial CT head negative for any acute finding and or strokes as mentioned above cannot obtain an MRI since patient has a spinal stimulator -We will check 2D echo, carotid Doppler -Neurology is not available today, 03/02/19.  May consult tomorrow if they are available  Chronic pain: Chronic back pain  -Until this Covid infection and hospitalization, patient was able to ambulate with walker - patient has a Spinal stimulator in place for many years -Has been on narcotics for long time along with other pain medication including Flexeril -We will hold narcotics and muscle relaxants for now -May use as needed NSAIDs -PT OT eval, treat and DC recommendation -Was came from [facility] facility following her recent discharge from [facility]  Hypernatremia: Found to be 149 at than when he was discharged from the [facility] hospital.  On 02/26/2019 patient's sodium was 143 -Today came down to 148 much less than expected with D5W of 75 cc/h -Continue D5W with potassium at 100 cc/h -Likely from hypovolemia -Repeat BMP -Monitor  Hypokalemia: Continues to  remain low -Status post p.o. and IV supplementation -Check magnesium level -Continue IV hydration with KCl  Leukocytosis: Improving  - patient's white blood cell count found to be in 20s on admission with neutrophil of 17.3.  On 02/26/2019 at discharge her WBC was 17.3  -Patient has not have any fever or dysuria -Could also be due to dehydration due to decreased p.o. intake -Was also receiving a steroid recently -UA followed by urine culture and blood culture -We will hold onto antibiotic for now   -IV hydration -Monitor closely as patient's white count is improving  Low TSH: Found to be 0.065 on admission -In the setting of recent infection and prolonged illness and all other comorbidities, may have to repeat thyroid panel in a couple of days -Recheck   DVT Prophylaxis: Lovenox Code Status: Full code Family Communication: Husband Disposition Plan: Skilled nursing facility; came from [facility]   LOS: 1 day   [employer]

## 2019-03-02 NOTE — Progress Notes (Signed)
Rehab Admissions Coordinator Note:  Patient was screened by Cleatrice Burke for appropriateness for an Inpatient Acute Rehab admit at [facility]. Noted pt diagnosed 01/30/2019 COVID. At [facility] 1/17 until 02/26/19 when discharged to [facility] for rehab . Admitted 2/1 to [facility]. Bed mobility only with PT and OT on eval today. I await further progress with therapy to demonstrate more active participation as well as medical workup to assist with planning dispo options. I will follow up tomorrow. Not yet demonstrated the tolerance for a possible inpt rehab admit.  Cleatrice Burke RN MSN 03/02/2019, 4:21 PM  I can be reached at [phone].

## 2019-03-02 NOTE — Evaluation (Addendum)
Physical Therapy Evaluation Patient Details Name: [name] MRN: [id number] DOB: 27-Oct-1945 Today's Date: 03/02/2019   History of Present Illness  74 y.o. female with recent discharge, on 02/26/2019 from [place] for COVID-19 infection hospitalization and altered level of consciousness comes today with altered level of consciousness.  Patient also has significant past medical history of asthma, spinal stimulator, HTN, anxiety, depression, migraines, HLD, restless leg and chronic pain managed with narcotics and other pain medication.  She was discharged to the skilled nursing facility from [facility] (hospitalized 02/14/19-02/26/19).    Clinical Impression  Pt easily woken at start of session, family at bedside to provide PLOF information; per family prior to recent hospitalizations pt was independent, driving.   The patient exhibited difficulty with communicating wants/needs during session, difficult to assess whether she was aphasic or AMS. Pt did exhibit/verbalize frustration with speech. Pt also demonstrated deficits in UE coordination as well as significant L sided neglect, complained of blurry vision. The patient was able to automatically move all extremities, LUE brought to midline once during session after PT lifted L arm. Pt reported pain/grimaced with LUE movement, as well as tenderness to palpation of L neck musculature. Despite max cueing pt unable to turn and look left,  No tone noted in LE, again able to move automatically including bilateral ankle DF/PF, repositioning of LE including LLE back onto bed with assistance. Presentation suggestive of neurological insult, RN and MD notified of pt status, neurology consult pending tomorrow, also unable to have MRI due to spinal stimulator. Overall the patient demonstrated deficits (see "PT Problem List") that impede the patient's functional abilities, safety, and mobility and would benefit from skilled PT intervention. Recommend  transition to acute inpatient rehab upon discharge for high-intensity, post-acute rehab services due to family support and to return to [facility] as able.       Follow Up Recommendations CIR    Equipment Recommendations  Other (comment)(TBD)    Recommendations for Other Services       Precautions / Restrictions Precautions Precautions: Fall;Other (comment) Precaution Comments: spinal cord stimulator Restrictions Weight Bearing Restrictions: No      Mobility  Bed Mobility Overal bed mobility: Needs Assistance             General bed mobility comments: unsafe to attempt EOB; Dep x2 to scoot up in bed and reposition hips to improve positioning and comfort and to encourage L side attention  Transfers                 General transfer comment: unsafe to attempt  Ambulation/Gait                Stairs            Wheelchair Mobility    Modified Rankin (Stroke Patients Only)       Balance                                             Pertinent Vitals/Pain Pain Assessment: Faces Faces Pain Scale: Hurts little more Pain Location: pt able to communicate she is having pain in head, back; grimaces slightly to touch applied to L lateral aspect of neck and L shoulder/traps, LUE with PROM Pain Descriptors / Indicators: Grimacing;Guarding;Headache Pain Intervention(s): Limited activity within patient's tolerance;Monitored during session;Repositioned    Home Living Family/patient expects to be discharged to:: Private residence Living  Arrangements: Spouse/significant other Available Help at Discharge: Family Type of Home: House Home Access: Ramped entrance     Home Layout: One level Home Equipment: Shower seat;Grab bars - tub/shower;Walker - 4 wheels Additional Comments: rollator    Prior Function Level of Independence: Needs assistance   Gait / Transfers Assistance Needed: Per spouse, before recent Covid and [facility] admission, pt was mod  indep with rollator for [facility] mobility (did not use consistently all the time, spouse reminded)  ADL's / Homemaking Assistance Needed: Pt able to dress herself, light meal prep, spouse provided assist with shower transfers for safety and washed her back, pt managed own medications, was driving; spouse did the cleaning 2/2 pt's chronic back pain  Comments: Spouse reports pt had a couple falls in the past 12 months     Hand Dominance   Dominant Hand: Left    Extremity/Trunk Assessment   Upper Extremity Assessment Upper Extremity Assessment: Defer to OT evaluation RUE Deficits / Details: grossly at least 3/5, mild impairments in hand/eye coordination with finger to nose testing, denies sensory deficits RUE Coordination: decreased fine motor;decreased gross motor LUE Deficits / Details: L neglect, able to voluntarily move LUE at times in response to tactile input primarily, grip at least 3/5, but unable to perform purposeful movements when asked with LUE, unclear if pt also having sensory deficits    Lower Extremity Assessment Lower Extremity Assessment: (Pt able to automatically move BLE such as ankle DF/PF and returning LLE to bed, difficulty re-directing pt to attempt voluntarily) LLE Deficits / Details: pt appears to have L side neglect, difficult to fully assess, pt does voluntarily perform DF/PF but unable to replicate on demand, difficult to assess sensory deficits    Cervical / Trunk Assessment Cervical / Trunk Assessment: Other exceptions Cervical / Trunk Exceptions: R gaze preference, pt demo's difficulty with attempts at passive cervical rotation to L to bring to midline (ultimately unsuccessful), pt grimaces to touch on lateral neck/musculature  Communication   Communication: Receptive difficulties;Expressive difficulties  Cognition Arousal/Alertness: Awake/alert Behavior During Therapy: Restless(RUE restless, picking at things, folding and wiping pillow with tissue) Overall  Cognitive Status: Impaired/Different from baseline Area of Impairment: Orientation;Attention;Following commands;Safety/judgement;Awareness;Problem solving                 Orientation Level: Disoriented to;Place;Time;Situation Current Attention Level: Focused   Following Commands: Follows one step commands inconsistently Safety/Judgement: Decreased awareness of deficits Awareness: Intellectual Problem Solving: Slow processing;Difficulty sequencing;Requires verbal cues;Requires tactile cues;Decreased initiation General Comments: Pt with fair attention when presented with visual and verbal stimuli on R side, but demonstrates L side neglect, requires multimodal cues for 1 step commands      General Comments      Exercises Other Exercises Other Exercises: Pt/caregiver education in positioning of room to improve pt's attention to L side   Assessment/Plan    PT Assessment Patient needs continued PT services  PT Problem List Decreased strength;Decreased mobility;Decreased safety awareness;Decreased range of motion;Decreased coordination;Decreased knowledge of precautions;Decreased activity tolerance;Decreased balance;Decreased knowledge of use of DME;Pain       PT Treatment Interventions DME instruction;Therapeutic exercise;Gait training;Balance training;Stair training;Neuromuscular re-education;Functional mobility training;Therapeutic activities;Patient/family education    PT Goals (Current goals can be found in the Care Plan section)  Acute Rehab PT Goals Patient Stated Goal: return to PLOF PT Goal Formulation: With patient Time For Goal Achievement: 03/16/19 Potential to Achieve Goals: Good    Frequency Min 2X/week   Barriers to discharge  Co-evaluation PT/OT/SLP Co-Evaluation/Treatment: Yes Reason for Co-Treatment: Complexity of the patient's impairments (multi-system involvement);For patient/therapist safety PT goals addressed during session: Mobility/safety  with mobility;Strengthening/ROM OT goals addressed during session: ADL's and self-care       AM-PAC PT "6 Clicks" Mobility  Outcome Measure Help needed turning from your back to your side while in a flat bed without using bedrails?: Total Help needed moving from lying on your back to sitting on the side of a flat bed without using bedrails?: Total Help needed moving to and from a bed to a chair (including a wheelchair)?: Total Help needed standing up from a chair using your arms (e.g., wheelchair or bedside chair)?: Total Help needed to walk in hospital room?: Total Help needed climbing 3-5 steps with a railing? : Total 6 Click Score: 6    End of Session   Activity Tolerance: Other (comment)(limited by cognitive status. difficulty assessing aphasia versus AMS) Patient left: in bed;with call bell/phone within reach;with bed alarm set;with family/visitor present Nurse Communication: Mobility status PT Visit Diagnosis: Other abnormalities of gait and mobility (R26.89);Difficulty in walking, not elsewhere classified (R26.2);Muscle weakness (generalized) (M62.81);Other symptoms and signs involving the nervous system [id number])    Time: [id number] PT Time Calculation (min) (ACUTE ONLY): 35 min   Charges:   PT Evaluation $PT Eval Moderate Complexity: 1 Mod          Lieutenant Diego PT, DPT 4:10 PM,03/02/19

## 2019-03-02 NOTE — Evaluation (Signed)
Occupational Therapy Evaluation Patient Details Name: [name] MRN: [id number] DOB: 1945-09-04 Today's Date: 03/02/2019    History of Present Illness 74 y.o. female with recent discharge, on 02/26/2019 from [place] for COVID-19 infection hospitalization and altered level of consciousness comes today with altered level of consciousness.  Patient also has significant past medical history of asthma, spinal stimulator, HTN, anxiety, depression, migraines, HLD, restless leg and chronic pain managed with narcotics and other pain medication.  She was discharged to the skilled nursing facility from [facility] (hospitalized 02/14/19-02/26/19).   Clinical Impression   Pt seen for OT evaluation this date. Prior to recent [facility] hospital admission for Covid-19, pt was generally independent in ADL (spouse provided supervision for safety during showers and washed her back), ambulating some with rollator, driving, and doing light meal prep. Chronic back pain limited her ability to participate in housekeeping tasks. Pt lives with spouse in 1 story home with ramped entrance and walk-in shower. Spouse provides PLOF/home setup.  The patient exhibited difficulty with communicating wants/needs during session, difficult to assess whether she was aphasic or AMS. Pt did exhibit/verbalize frustration with speech. Pt also demonstrates significant impairments in LUE/LLE purposeful movement (able to move voluntarily to stimuli and ad hoc), coordination, and L sided neglect. Pt is left hand dominant, per husband. Mild RUE coordination deficits noted. Pt complains of blurry vision as well. Able to track somewhat (not smoothly) to midline with max cues once. Head turned to R and pt reports pain/discomfort with attempts to reposition. Spouse notes that pt had IV in L side of neck. Tenderness noted with palpation to L lateral aspect of neck musculature. With max manual cues to position LUE across midline, pt then uses L hand  to pick at IV in R arm briefly but unable to sustain. Presentation suggestive of neurological insult, RN and MD notified of pt status, neurology consult pending tomorrow, also unable to have MRI due to spinal stimulator. Pt would benefit from high intensity, skilled OT services in conjunction with PT and SLP to address noted impairments and functional limitations (see below for any additional details) in order to maximize safety and independence while minimizing falls risk and caregiver burden.  Upon hospital discharge, recommend pt discharge to CIR to maximize safety and return to PLOF.    Follow Up Recommendations  CIR    Equipment Recommendations  3 in 1 bedside commode    Recommendations for Other Services Rehab consult     Precautions / Restrictions Precautions Precautions: Fall;Other (comment) Precaution Comments: spinal cord stimulator Restrictions Weight Bearing Restrictions: No      Mobility Bed Mobility Overal bed mobility: Needs Assistance             General bed mobility comments: unsafe to attempt EOB; Dep x2 to scoot up in bed and reposition hips to improve positioning and comfort  Transfers                 General transfer comment: unsafe to attempt    Balance                                           ADL either performed or assessed with clinical judgement   ADL Overall ADL's : Needs assistance/impaired Eating/Feeding: Maximal assistance;With caregiver independent assisting;Bed level   Grooming: Maximal assistance;Bed level  General ADL Comments: Max for all bed level ADL at this time, significant L neglect     Vision Baseline Vision/History: Wears glasses Wears Glasses: At all times Patient Visual Report: Blurring of vision Vision Assessment?: Yes Eye Alignment: Within Functional Limits Ocular Range of Motion: Restricted on the left Alignment/Gaze Preference: Head turned;Head  tilt;Gaze right Tracking/Visual Pursuits: Impaired - to be further tested in functional context Visual Fields: Impaired-to be further tested in functional context     Perception     Praxis      Pertinent Vitals/Pain Pain Assessment: Faces Faces Pain Scale: Hurts little more Pain Location: pt able to communicate she is having pain in head, back; grimaces slightly to touch applied to L lateral aspect of neck and L shoulder/traps, LUE with PROM Pain Descriptors / Indicators: Grimacing;Guarding;Headache Pain Intervention(s): Limited activity within patient's tolerance;Monitored during session;Repositioned     Hand Dominance Left   Extremity/Trunk Assessment Upper Extremity Assessment Upper Extremity Assessment: RUE deficits/detail;LUE deficits/detail;Difficult to assess due to impaired cognition RUE Deficits / Details: grossly at least 3/5, mild impairments in hand/eye coordination with finger to nose testing, denies sensory deficits RUE Coordination: decreased fine motor;decreased gross motor LUE Deficits / Details: L neglect, able to voluntarily move LUE at times in response to tactile input primarily, grip at least 3/5, but unable to perform purposeful movements when asked with LUE, unclear if pt also having sensory deficits   Lower Extremity Assessment Lower Extremity Assessment: Defer to PT evaluation;RLE deficits/detail;LLE deficits/detail;Difficult to assess due to impaired cognition LLE Deficits / Details: pt appears to have L side neglect, difficult to fully assess, pt does voluntarily perform DF/PF but unable to replicate on demand, difficult to assess sensory deficits   Cervical / Trunk Assessment Cervical / Trunk Assessment: Other exceptions Cervical / Trunk Exceptions: R gaze preference, pt demo's difficulty with attempts at passive cervical rotation to L to bring to midline (ultimately unsuccessful), pt grimaces to touch on lateral neck/musculature   Communication  Communication Communication: Receptive difficulties;Expressive difficulties   Cognition Arousal/Alertness: Awake/alert Behavior During Therapy: Restless(RUE restless, picking at things, folding and wiping pillow with tissue) Overall Cognitive Status: Impaired/Different from baseline Area of Impairment: Orientation;Attention;Following commands;Safety/judgement;Awareness;Problem solving                 Orientation Level: Disoriented to;Place;Time;Situation Current Attention Level: Focused   Following Commands: Follows one step commands inconsistently Safety/Judgement: Decreased awareness of deficits Awareness: Intellectual Problem Solving: Slow processing;Difficulty sequencing;Requires verbal cues;Requires tactile cues;Decreased initiation General Comments: Pt with fair attention when presented with visual and verbal stimuli on R side, but demonstrates L side neglect, requires multimodal cues for 1 step commands   General Comments       Exercises Other Exercises Other Exercises: Pt/caregiver education in positioning of room to improve pt's attention to L side   Shoulder Instructions      Home Living Family/patient expects to be discharged to:: Private residence Living Arrangements: Spouse/significant other Available Help at Discharge: Family Type of Home: House Home Access: Ramped entrance     Home Layout: One level     Bathroom Shower/Tub: Walk-in shower         Home Equipment: Shower seat;Grab bars - tub/shower;Walker - 4 wheels   Additional Comments: rollator      Prior Functioning/Environment Level of Independence: Needs assistance  Gait / Transfers Assistance Needed: Per spouse, before recent Covid and [facility] admission, pt was mod indep with rollator for [facility] mobility (did not use consistently all the time, spouse  reminded) ADL's / Homemaking Assistance Needed: Pt able to dress herself, light meal prep, spouse provided assist with shower transfers for safety and  washed her back, pt managed own medications, was driving; spouse did the cleaning 2/2 pt's chronic back pain   Comments: Spouse reports pt had a couple falls in the past 12 months        OT Problem List: Decreased strength;Decreased coordination;Pain;Decreased cognition;Impaired vision/perception;Impaired UE functional use;Impaired balance (sitting and/or standing)      OT Treatment/Interventions: Self-care/ADL training;Therapeutic exercise;Therapeutic activities;Neuromuscular education;Cognitive remediation/compensation;Visual/perceptual remediation/compensation;DME and/or AE instruction;Patient/family education;Balance training    OT Goals(Current goals can be found in the care plan section) Acute Rehab OT Goals Patient Stated Goal: return to PLOF OT Goal Formulation: With patient/family Time For Goal Achievement: 03/16/19 Potential to Achieve Goals: Good  OT Frequency: Min 3X/week   Barriers to D/C:            Co-evaluation PT/OT/SLP Co-Evaluation/Treatment: Yes Reason for Co-Treatment: Complexity of the patient's impairments (multi-system involvement);For patient/therapist safety PT goals addressed during session: Mobility/safety with mobility OT goals addressed during session: ADL's and self-care      AM-PAC OT "6 Clicks" Daily Activity     Outcome Measure Help from another person eating meals?: A Lot Help from another person taking care of personal grooming?: A Lot Help from another person toileting, which includes using toliet, bedpan, or urinal?: Total Help from another person bathing (including washing, rinsing, drying)?: Total Help from another person to put on and taking off regular upper body clothing?: Total Help from another person to put on and taking off regular lower body clothing?: Total 6 Click Score: 8   End of Session Nurse Communication: Mobility status;Other (comment)(cognition, neglect)  Activity Tolerance: Patient tolerated treatment well Patient  left: in bed;with call bell/phone within reach;with bed alarm set;with family/visitor present  OT Visit Diagnosis: Other abnormalities of gait and mobility (R26.89);Low vision, both eyes (H54.2);Muscle weakness (generalized) (M62.81);Other symptoms and signs involving cognitive function;Hemiplegia and hemiparesis Hemiplegia - Right/Left: Left Hemiplegia - dominant/non-dominant: Dominant Hemiplegia - caused by: Unspecified                Time: [id number] OT Time Calculation (min): 36 min Charges:  OT General Charges $OT Visit: 1 Visit OT Evaluation $OT Eval Moderate Complexity: 1 Mod  Jeni Salles, MPH, MS, OTR/L ascom [phone] 03/02/19, 4:48 PM
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
## 2019-03-02 NOTE — Plan of Care (Signed)
  Problem: Clinical Measurements: Goal: Ability to maintain clinical measurements within normal limits will improve Outcome: Progressing Goal: Diagnostic test results will improve Outcome: Progressing   Problem: Activity: Goal: Risk for activity intolerance will decrease Outcome: Progressing   Problem: Safety: Goal: Ability to remain free from injury will improve Outcome: Progressing

## 2019-03-03 ENCOUNTER — Encounter: Payer: Self-pay | Admitting: Family Medicine

## 2019-03-03 ENCOUNTER — Inpatient Hospital Stay: Payer: Medicare Other

## 2019-03-03 ENCOUNTER — Inpatient Hospital Stay
Admit: 2019-03-03 | Discharge: 2019-03-03 | Disposition: A | Discharge status: Home / Self Care | Payer: Medicare Other | Attending: Family Medicine | Admitting: Family Medicine

## 2019-03-03 DIAGNOSIS — L8990 Pressure ulcer of unspecified site, unspecified stage: Secondary | ICD-10-CM | POA: Insufficient documentation

## 2019-03-03 DIAGNOSIS — E870 Hyperosmolality and hypernatremia: Secondary | ICD-10-CM | POA: Diagnosis not present

## 2019-03-03 DIAGNOSIS — D72829 Elevated white blood cell count, unspecified: Secondary | ICD-10-CM

## 2019-03-03 DIAGNOSIS — G9341 Metabolic encephalopathy: Secondary | ICD-10-CM

## 2019-03-03 DIAGNOSIS — R404 Transient alteration of awareness: Secondary | ICD-10-CM | POA: Diagnosis not present

## 2019-03-03 DIAGNOSIS — R509 Fever, unspecified: Secondary | ICD-10-CM

## 2019-03-03 DIAGNOSIS — L89151 Pressure ulcer of sacral region, stage 1: Secondary | ICD-10-CM

## 2019-03-03 DIAGNOSIS — R531 Weakness: Secondary | ICD-10-CM | POA: Diagnosis not present

## 2019-03-03 DIAGNOSIS — E876 Hypokalemia: Secondary | ICD-10-CM

## 2019-03-03 LAB — URINALYSIS, COMPLETE (UACMP) WITH MICROSCOPIC
Bacteria, UA: NONE SEEN
Bilirubin Urine: NEGATIVE
Glucose, UA: NEGATIVE mg/dL
Hgb urine dipstick: NEGATIVE
Ketones, ur: NEGATIVE mg/dL
Nitrite: NEGATIVE
Protein, ur: NEGATIVE mg/dL
Specific Gravity, Urine: 1.018 (ref 1.005–1.030)
pH: 6 (ref 5.0–8.0)

## 2019-03-03 LAB — CBC WITH DIFFERENTIAL/PLATELET
Abs Immature Granulocytes: 0.14 10*3/uL — ABNORMAL HIGH (ref 0.00–0.07)
Basophils Absolute: 0 10*3/uL (ref 0.0–0.1)
Basophils Relative: 0 %
Eosinophils Absolute: 0.3 10*3/uL (ref 0.0–0.5)
Eosinophils Relative: 2 %
HCT: 29.1 % — ABNORMAL LOW (ref 36.0–46.0)
Hemoglobin: 9 g/dL — ABNORMAL LOW (ref 12.0–15.0)
Immature Granulocytes: 1 %
Lymphocytes Relative: 13 %
Lymphs Abs: 1.7 10*3/uL (ref 0.7–4.0)
MCH: 28.2 pg (ref 26.0–34.0)
MCHC: 30.9 g/dL (ref 30.0–36.0)
MCV: 91.2 fL (ref 80.0–100.0)
Monocytes Absolute: 0.5 10*3/uL (ref 0.1–1.0)
Monocytes Relative: 4 %
Neutro Abs: 10.9 10*3/uL — ABNORMAL HIGH (ref 1.7–7.7)
Neutrophils Relative %: 80 %
Platelets: 256 10*3/uL (ref 150–400)
RBC: 3.19 MIL/uL — ABNORMAL LOW (ref 3.87–5.11)
RDW: 16.1 % — ABNORMAL HIGH (ref 11.5–15.5)
WBC: 13.6 10*3/uL — ABNORMAL HIGH (ref 4.0–10.5)
nRBC: 0 % (ref 0.0–0.2)

## 2019-03-03 LAB — URINE CULTURE: Culture: <10000 — AB

## 2019-03-03 LAB — ECHOCARDIOGRAM COMPLETE: Weight: 2631.41 oz

## 2019-03-03 IMAGING — CT CT CERVICAL SPINE W/ CM
1 series · 11 of 19 positions shown · IV contrast (APPLIED)
Comparison: MRI cervical spine 07/21/2012

CLINICAL DATA: Neck pain acute.  Rule out infection.

EXAM:
CT CERVICAL SPINE WITH CONTRAST
TECHNIQUE: Multidetector CT imaging of the cervical spine was performed during
intravenous contrast administration. Multiplanar CT image
reconstructions were also generated.
CONTRAST:  75mL OMNIPAQUE IOHEXOL 350 MG/ML SOLN

[Series 9: c spine soft cor · coronal · 0.26mm/px · 11 of 57 slices shown]
[im 3/57  bone]
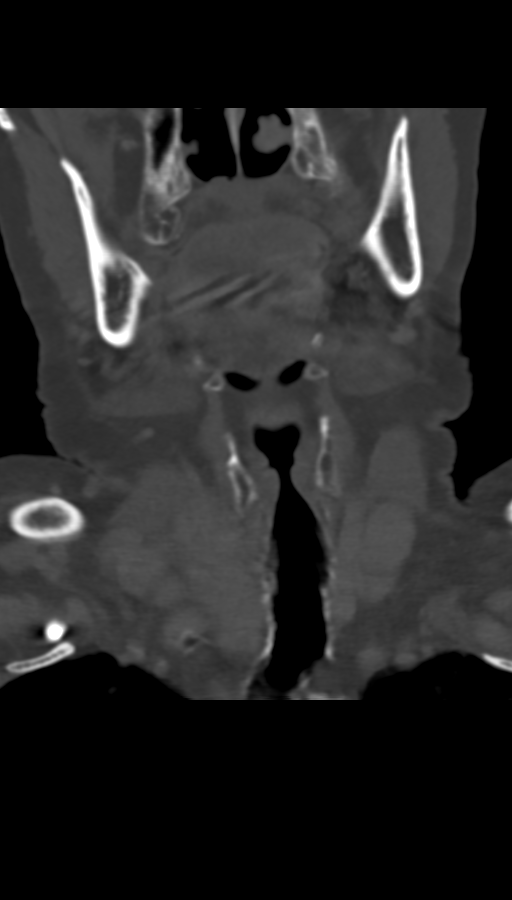
[im 9/57  bone]
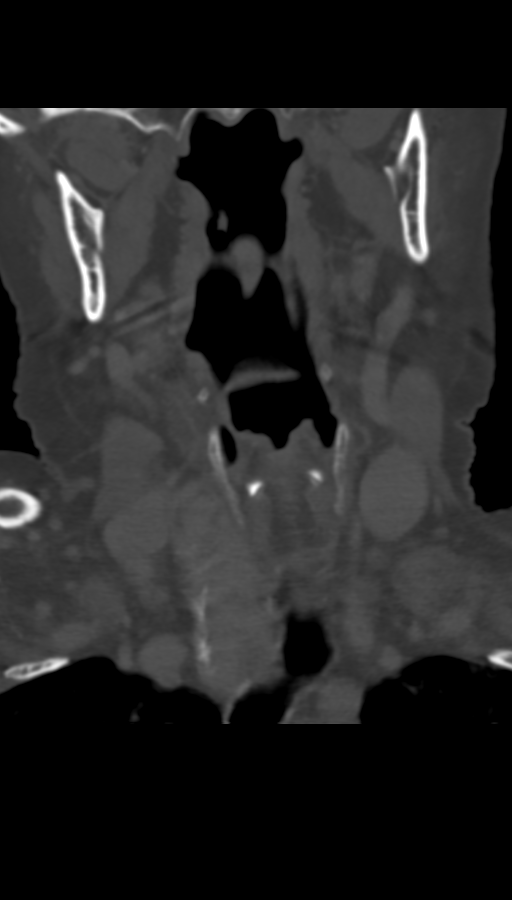
[im 13/57  bone]
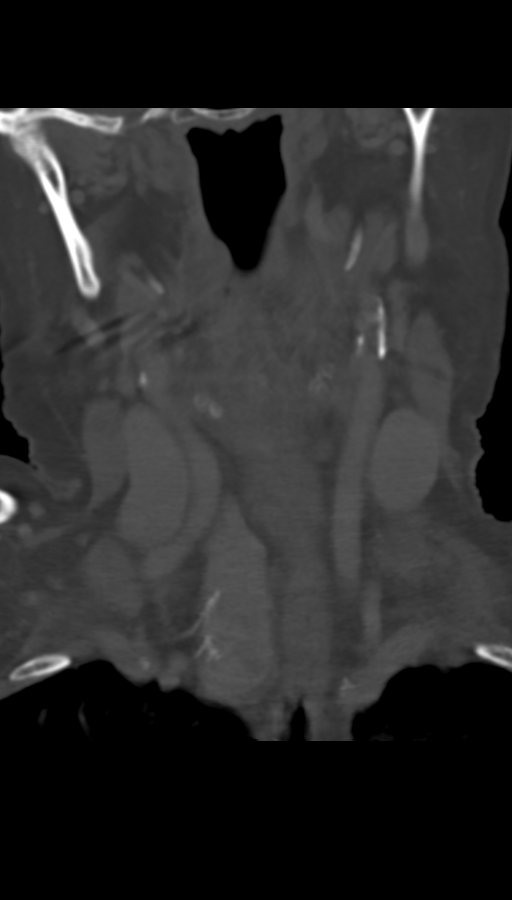
[im 22/57  bone]
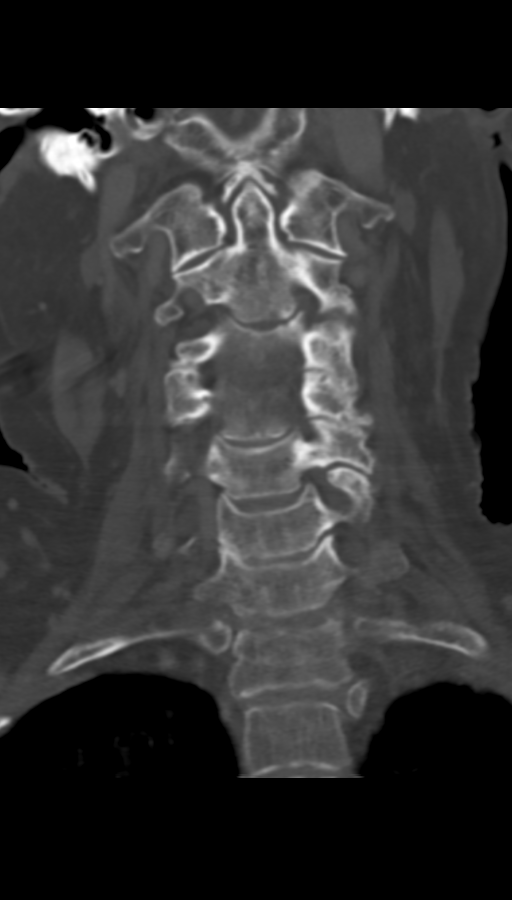
[im 24/57  bone]
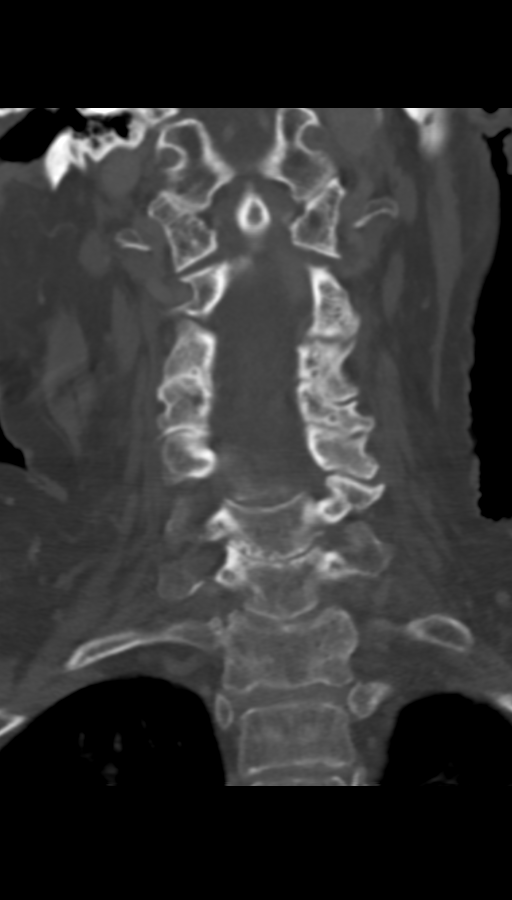
[im 31/57  bone]
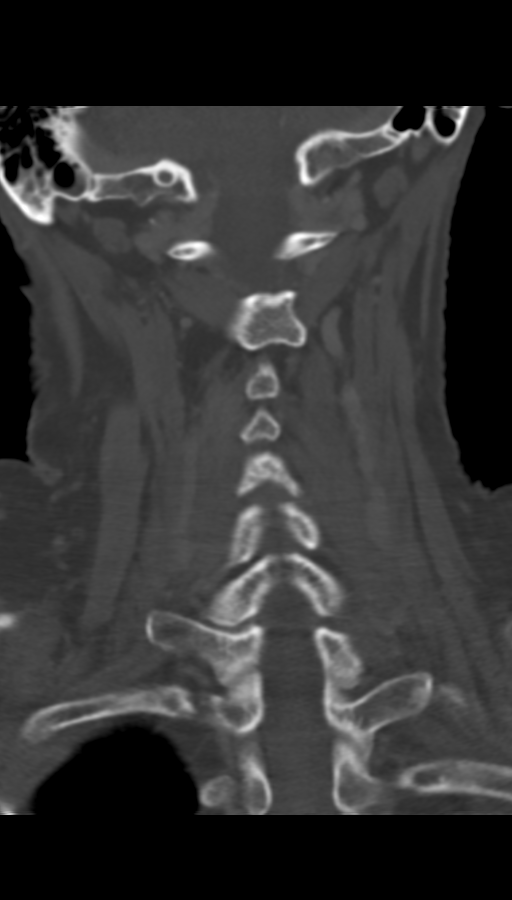
[im 35/57  bone]
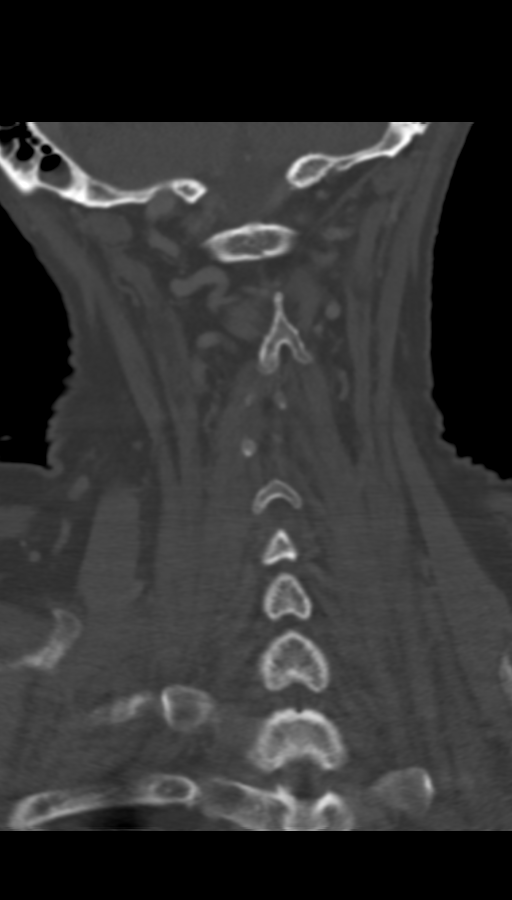
[im 39/57  bone]
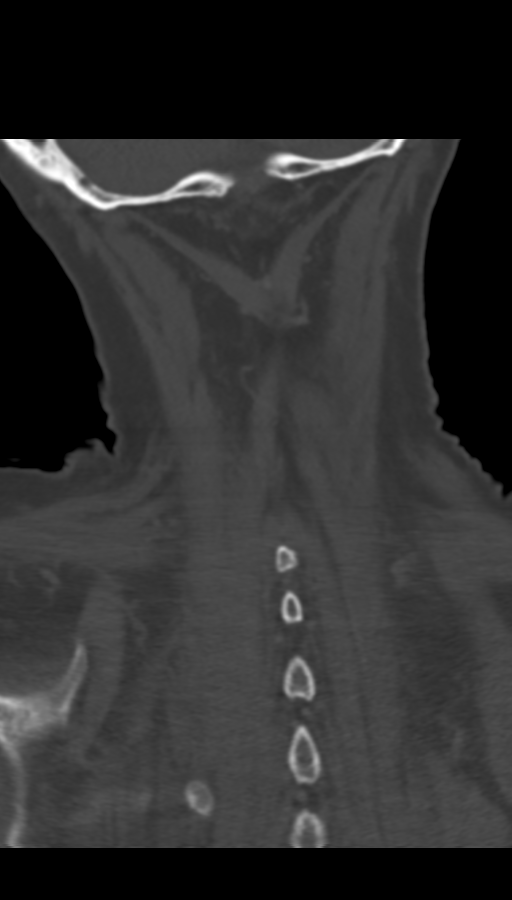
[im 45/57  bone]
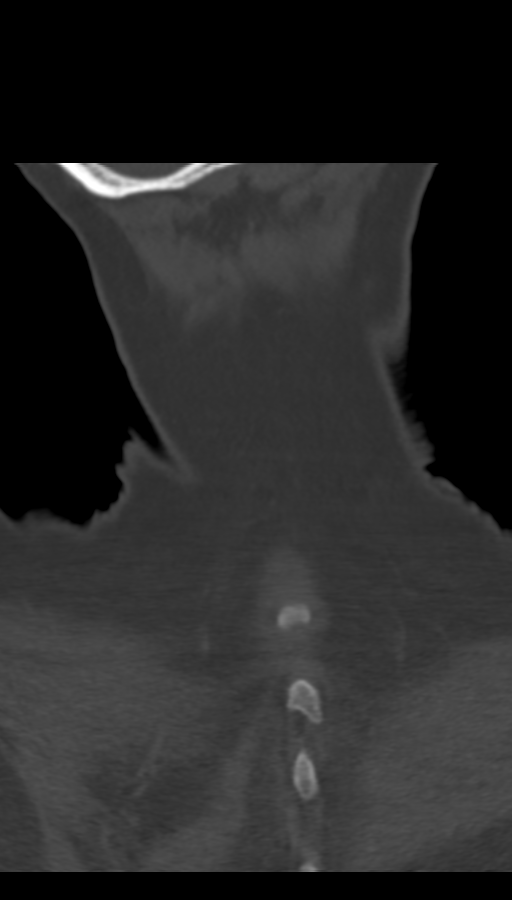
[im 48/57  bone]
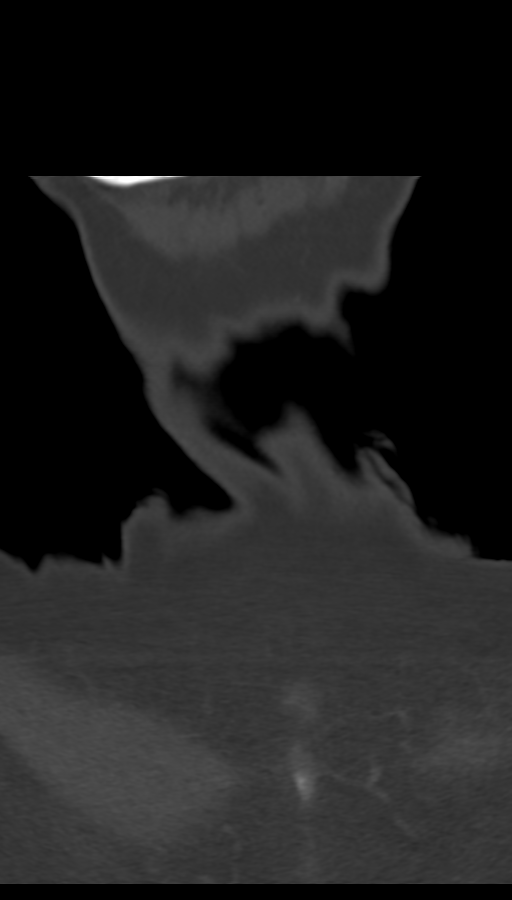
[im 56/57  bone]
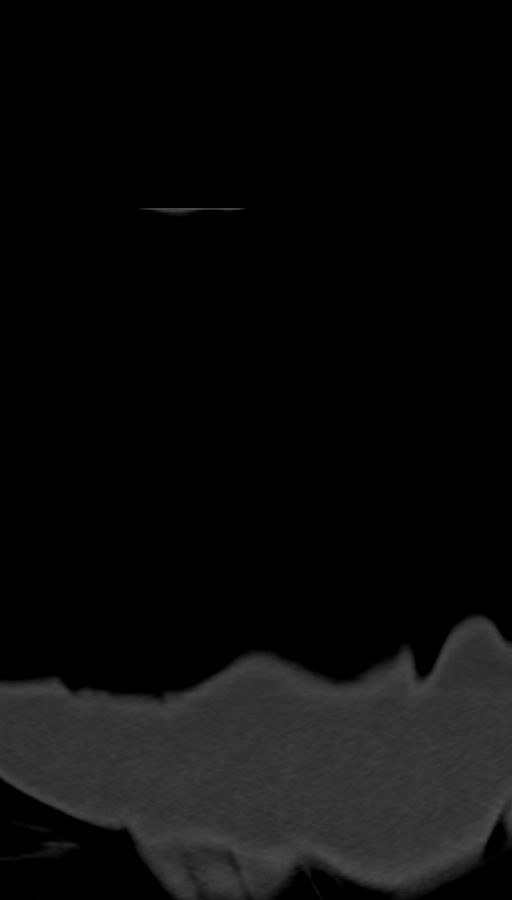

[11 of 19 positions shown; findings below may reference images not displayed]

FINDINGS: Alignment: Mild anterolisthesis C4-5 and C5-6. Straightening of the
cervical lordosis

Skull base and vertebrae: Negative for fracture or mass. No evidence
of bone infection in the cervical spine

Soft tissues and spinal canal: Right lobe of the thyroid is
diffusely enlarged with heterogeneous enhancement and multiple
coarse calcifications. It is difficult to determine if this is a
solitary nodule or multiple adjacent nodules. Overall the right lobe
of the thyroid measures 32 x 18 x 52 mm. Small left lobe without
nodularity. No adenopathy in neck. No soft tissue mass or edema in
the neck.

Disc levels: C2-3: Mild disc degeneration and spurring on the left.
Mild left foraminal narrowing due to uncinate spurring

C3-4: Bilateral facet degeneration.  Mild left foraminal narrowing

C4-5: Mild anterolisthesis. Asymmetric facet degeneration on the
left with mild left foraminal narrowing due to spurring

C5-6: Mild anterolisthesis with asymmetric facet degeneration on the
left. Mild left foraminal narrowing.

C6-7: Disc degeneration and spondylosis with diffuse uncinate
spurring. Mild foraminal narrowing bilaterally and mild spinal
stenosis

C7-T1: Asymmetric facet degeneration on the right. Mild right
foraminal narrowing due to spurring.

Upper chest: Lung apices clear bilaterally.

Other: None
IMPRESSION: No acute abnormality in the cervical spine. No fracture or spinal
infection

Cervical spondylosis as above.

Enlargement of the right lobe of the thyroid with heterogeneous
enhancement multiple coarse calcifications. Possible large nodule
versus goiter in the right lobe of the thyroid. Thyroid ultrasound
recommended.(Ref: [HOSPITAL]. [DATE]): 143-50).

## 2019-03-03 MED ORDER — POTASSIUM CHLORIDE CRYS ER 20 MEQ PO TBCR: 40.0000 meq | EXTENDED_RELEASE_TABLET | Freq: Once | ORAL | Status: DC

## 2019-03-03 MED ORDER — IOHEXOL 350 MG/ML SOLN
75.0000 mL | Freq: Once | INTRAVENOUS | Status: AC | PRN
  Administered 2019-03-03: 75 mL via INTRAVENOUS

## 2019-03-03 MED ORDER — MAGNESIUM OXIDE 400 (241.3 MG) MG PO TABS
400.0000 mg | ORAL_TABLET | Freq: Every day | ORAL | Status: DC
  Administered 2019-03-03: 400 mg via ORAL
  Filled 2019-03-03: qty 1

## 2019-03-03 MED ORDER — QUETIAPINE FUMARATE 25 MG PO TABS
12.5000 mg | ORAL_TABLET | Freq: Every day | ORAL | Status: DC
  Administered 2019-03-03 – 2019-03-04 (×2): 12.5 mg via ORAL
  Filled 2019-03-03 (×2): qty 1

## 2019-03-03 MED ORDER — POTASSIUM CL IN DEXTROSE 5% 20 MEQ/L IV SOLN
20.0000 meq | INTRAVENOUS | Status: DC
  Administered 2019-03-03 (×2): 20 meq via INTRAVENOUS
  Filled 2019-03-03 (×3): qty 1000
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
## 2019-03-03 NOTE — Progress Notes (Signed)
*  PRELIMINARY RESULTS* Echocardiogram 2D Echocardiogram has been performed.  Wallie Char Missy Baksh 03/03/2019, 10:17 AM

## 2019-03-03 NOTE — Progress Notes (Signed)
Inpatient Rehabilitation Admissions Coordinator  Patient continues to make progress. I will place order for rehab consult and I will follow up with patient and her souse tomorrow to begin rehab assessment and discussion of venue options.  Danne Baxter, RN, MSN Rehab Admissions Coordinator [phone] 03/03/2019 6:21 PM

## 2019-03-03 NOTE — Progress Notes (Signed)
Patient ID: [name], female   DOB: 1945/12/26, 74 y.o.   MRN: [id number] [facility] PROGRESS NOTE  [name] [id number] DOB: 1945/09/09 DOA: 03/01/2019 PCP: Randel Books, FNP  HPI/Subjective: Patient seen this morning.  She was looking over to the right and unable to bend her neck to the left.  She says when she was over at [facility] she had an IV in her left neck.  Patient had a low-grade temperature this morning.  Came in with altered mental status.  Having diarrhea.  Did not want to eat today  Objective: Vitals:   03/02/19 2139 03/03/19 0805  BP:  124/61  Pulse:  79  Resp:  15  Temp: 99.6 F (37.6 C) 98 F (36.7 C)  SpO2:  90%    Intake/Output Summary (Last 24 hours) at 03/03/2019 1418 Last data filed at 03/03/2019 1105 Gross per 24 hour  Intake 893.33 ml  Output 1550 ml  Net -656.67 ml   Filed Weights   03/01/19 2142  Weight: 74.6 kg    ROS: Review of Systems  Constitutional: Negative for chills and fever.  Eyes: Negative for blurred vision.  Respiratory: Positive for shortness of breath. Negative for cough.   Cardiovascular: Negative for chest pain.  Gastrointestinal: Positive for abdominal pain and diarrhea. Negative for constipation, nausea and vomiting.  Genitourinary: Negative for dysuria.  Musculoskeletal: Positive for neck pain. Negative for joint pain.  Neurological: Negative for dizziness and headaches.   Exam: Physical Exam  HENT:  Nose: No mucosal edema.  Mouth/Throat: No oropharyngeal exudate or posterior oropharyngeal edema.  Eyes: Pupils are equal, round, and reactive to light. Conjunctivae and lids are normal.  Neck: Carotid bruit is not present.  Cardiovascular: S1 normal and S2 normal. Exam reveals no gallop.  No murmur heard. Respiratory: No respiratory distress. She has decreased breath sounds in the right lower field and the left lower field. She has no wheezes. She has no rhonchi. She has no rales.  GI: Soft.  Bowel sounds are normal. There is no abdominal tenderness.  Musculoskeletal:     Right ankle: No swelling.     Left ankle: No swelling.  Lymphadenopathy:    She has no cervical adenopathy.  Neurological: She is alert. No cranial nerve deficit.  Left side power 4 out of 5.  Right-sided power 5 out of 5  Skin: Skin is warm. No rash noted. Nails show no clubbing.  Psychiatric:  Answers some questions appropriately.      Data Reviewed: Basic Metabolic Panel: Recent Labs  Lab 03/01/19 1408 03/01/19 1656 03/02/19 0559 03/02/19 0909 03/02/19 1457  NA 149*  --  148*  --   --   K 3.2*  --  2.8*  --  3.3*  CL 114*  --  113*  --   --   CO2 26  --  24  --   --   GLUCOSE 147*  --  144*  --   --   BUN 16  --  16  --   --   CREATININE 0.59 0.35* 0.41*  --   --   CALCIUM 8.8*  --  8.4*  --   --   MG  --   --   --  1.6*  --    Liver Function Tests: Recent Labs  Lab 03/01/19 1408  AST 15  ALT 28  ALKPHOS 54  BILITOT 0.7  PROT 6.9  ALBUMIN 2.5*    Recent Labs  Lab  03/02/19 0909  AMMONIA 17   CBC: Recent Labs  Lab 03/01/19 1408 03/01/19 1656 03/02/19 0559 03/03/19 0708  WBC 20.2* 19.2* 16.2* 13.6*  NEUTROABS 17.3*  --   --  10.9*  HGB 10.5* 10.4* 9.7* 9.0*  HCT 34.3* 33.3* 31.4* 29.1*  MCV 92.2 91.7 91.5 91.2  PLT 357 312 297 256    CBG: Recent Labs  Lab 03/01/19 1413  GLUCAP 122*    Recent Results (from the past 240 hour(s))  Blood culture (routine x 2)     Status: None (Preliminary result)   Collection Time: 03/01/19  3:41 PM   Specimen: BLOOD  Result Value Ref Range Status   Specimen Description BLOOD LEFT ANTECUBITAL  Final   Special Requests   Final    BOTTLES DRAWN AEROBIC AND ANAEROBIC Blood Culture adequate volume   Culture   Final    NO GROWTH 2 DAYS Performed at [facility], [address], [place], [place] [address]    Report Status PENDING  Incomplete  Blood culture (routine x 2)     Status: None (Preliminary result)    Collection Time: 03/01/19  3:41 PM   Specimen: BLOOD  Result Value Ref Range Status   Specimen Description BLOOD BLOOD RIGHT FOREARM  Final   Special Requests   Final    BOTTLES DRAWN AEROBIC AND ANAEROBIC Blood Culture results may not be optimal due to an inadequate volume of blood received in culture bottles   Culture   Final    NO GROWTH 2 DAYS Performed at [facility], [address], [place], [place] [address]    Report Status PENDING  Incomplete  Urine Culture     Status: Abnormal   Collection Time: 03/02/19 10:12 AM   Specimen: Urine, Clean Catch  Result Value Ref Range Status   Specimen Description   Final    URINE, CLEAN CATCH Performed at [facility], [address], [place], [place] [address]    Special Requests   Final    NONE Performed at [facility], [address] [place], [place] [address]    Culture (A)  Final    <10,000 COLONIES/mL INSIGNIFICANT GROWTH Performed at [facility], [place] [address] [place],  [address]    Report Status 03/03/2019 FINAL  Final     Studies: CT ANGIO HEAD W OR WO CONTRAST  Result Date: 03/03/2019 CLINICAL DATA:  Acute neuro deficit. Rule out stroke. Recent COVID-19 EXAM: CT ANGIOGRAPHY HEAD TECHNIQUE: Multidetector CT imaging of the head was performed using the standard protocol during bolus administration of intravenous contrast. Multiplanar CT image reconstructions and MIPs were obtained to evaluate the vascular anatomy. CONTRAST:  64mL OMNIPAQUE IOHEXOL 350 MG/ML SOLN COMPARISON:  CT head 03/01/2019 FINDINGS: CT HEAD Brain: Mild atrophy. Negative for hydrocephalus. Negative for acute infarct, hemorrhage, mass. No fluid collection or midline shift. Vascular: Negative for hyperdense vessel Skull: Negative Sinuses: Mild mucosal edema sphenoid sinus. Remaining sinuses clear. Right mastoid effusion. Orbits: Negative CTA HEAD Anterior circulation: Atherosclerotic calcification in the cavernous  carotid bilaterally without significant stenosis. Anterior and middle cerebral arteries widely patent without stenosis or aneurysm. Posterior circulation: Both vertebral arteries patent to the basilar. PICA patent bilaterally. Basilar widely patent. AICA, superior cerebellar, posterior cerebral arteries patent bilaterally without stenosis or aneurysm. Venous sinuses: Normal venous enhancement Anatomic variants: None IMPRESSION: No acute intracranial abnormality Negative CTA head Electronically Signed   By: Franchot Gallo M.D.   On: 03/03/2019 12:45  CT Head Wo Contrast  Result Date: 03/01/2019 CLINICAL DATA:  Altered mental status. EXAM: CT HEAD WITHOUT CONTRAST TECHNIQUE: Contiguous axial images were obtained from the base of the skull through the vertex without intravenous contrast. COMPARISON:  Jun 22, 2010 FINDINGS: Brain: No evidence of acute infarction, hemorrhage, hydrocephalus, extra-axial collection or mass lesion/mass effect. There is chronic diffuse atrophy. Chronic bilateral periventricular white matter small vessel ischemic changes identified. Vascular: No hyperdense vessel is noted. Skull: Normal. Negative for fracture or focal lesion. Sinuses/Orbits: No acute finding. Other: None. IMPRESSION: No focal acute intracranial abnormality identified. Chronic diffuse atrophy. Chronic bilateral periventricular white matter small vessel ischemic change. Electronically Signed   By: Abelardo Diesel M.D.   On: 03/01/2019 14:50   CT CERVICAL SPINE W CONTRAST  Result Date: 03/03/2019 CLINICAL DATA:  Neck pain acute.  Rule out infection. EXAM: CT CERVICAL SPINE WITH CONTRAST TECHNIQUE: Multidetector CT imaging of the cervical spine was performed during intravenous contrast administration. Multiplanar CT image reconstructions were also generated. CONTRAST:  90mL OMNIPAQUE IOHEXOL 350 MG/ML SOLN COMPARISON:  MRI cervical spine 07/21/2012 FINDINGS: Alignment: Mild anterolisthesis C4-5 and C5-6. Straightening of the  cervical lordosis Skull base and vertebrae: Negative for fracture or mass. No evidence of bone infection in the cervical spine Soft tissues and spinal canal: Right lobe of the thyroid is diffusely enlarged with heterogeneous enhancement and multiple coarse calcifications. It is difficult to determine if this is a solitary nodule or multiple adjacent nodules. Overall the right lobe of the thyroid measures 32 x 18 x 52 mm. Small left lobe without nodularity. No adenopathy in neck. No soft tissue mass or edema in the neck. Disc levels: C2-3: Mild disc degeneration and spurring on the left. Mild left foraminal narrowing due to uncinate spurring C3-4: Bilateral facet degeneration.  Mild left foraminal narrowing C4-5: Mild anterolisthesis. Asymmetric facet degeneration on the left with mild left foraminal narrowing due to spurring C5-6: Mild anterolisthesis with asymmetric facet degeneration on the left. Mild left foraminal narrowing. C6-7: Disc degeneration and spondylosis with diffuse uncinate spurring. Mild foraminal narrowing bilaterally and mild spinal stenosis C7-T1: Asymmetric facet degeneration on the right. Mild right foraminal narrowing due to spurring. Upper chest: Lung apices clear bilaterally. Other: None IMPRESSION: No acute abnormality in the cervical spine. No fracture or spinal infection Cervical spondylosis as above. Enlargement of the right lobe of the thyroid with heterogeneous enhancement multiple coarse calcifications. Possible large nodule versus goiter in the right lobe of the thyroid. Thyroid ultrasound recommended.(Ref: J Am Coll Radiol. 2015 Feb;12(2): 143-50). Electronically Signed   By: Franchot Gallo M.D.   On: 03/03/2019 12:51   US Carotid Bilateral  Result Date: 03/03/2019 CLINICAL DATA:  Weakness. Syncopal episode. History of hypertension, hyperlipidemia and smoking. EXAM: BILATERAL CAROTID DUPLEX ULTRASOUND TECHNIQUE: Pearline Cables scale imaging, color Doppler and duplex ultrasound were  performed of bilateral carotid and vertebral arteries in the neck. COMPARISON:  None. FINDINGS: Criteria: Quantification of carotid stenosis is based on velocity parameters that correlate the residual internal carotid diameter with NASCET-based stenosis levels, using the diameter of the distal internal carotid lumen as the denominator for stenosis measurement. The following velocity measurements were obtained: RIGHT ICA: 119/30 cm/sec CCA: [id number] cm/sec SYSTOLIC ICA/CCA RATIO:  1.3 ECA: 94 cm/sec LEFT ICA: 95/23 cm/sec CCA: [id number] cm/sec SYSTOLIC ICA/CCA RATIO:  1.1 ECA: 81 cm/sec RIGHT CAROTID ARTERY: There is a minimal amount of eccentric echogenic plaque within the right carotid bulb (image 16), extending to involve the origin and proximal aspects  of the right internal carotid artery (image 22), not resulting in elevated peak systolic velocities within the interrogated course of the right internal carotid artery to suggest a hemodynamically significant stenosis. RIGHT VERTEBRAL ARTERY:  Antegrade flow LEFT CAROTID ARTERY: There is a minimal to moderate amount of eccentric echogenic plaque within the left carotid bulb (image 36 and 47), extending to involve the origin and proximal aspects of the left internal carotid artery (image 53), not resulting in elevated peak systolic velocities within the interrogated course of the left internal carotid artery to suggest a hemodynamically significant stenosis. LEFT VERTEBRAL ARTERY:  Antegrade flow IMPRESSION: Minimal to moderate amount of bilateral atherosclerotic plaque, left greater than right, not resulting in a hemodynamically significant stenosis within either internal carotid artery. Electronically Signed   By: Sandi Mariscal M.D.   On: 03/03/2019 09:07   DG Chest Port 1 View  Result Date: 03/03/2019 CLINICAL DATA:  Fever.  Confusion. EXAM: PORTABLE CHEST 1 VIEW COMPARISON:  03/01/2019; 02/14/2019; 06/30/2015 FINDINGS: Grossly unchanged cardiac silhouette and  mediastinal contours with atherosclerotic plaque within the thoracic aorta. There is persistent rightward deviation of the tracheal air column at the level of the thoracic aorta. There is persistent thickening the right paratracheal stripe presumably secondary prominent vasculature. Continued improved aeration of the lungs with persistent ill-defined heterogeneous opacities within the bilateral mid and lower lungs, left greater than right. No new focal airspace opacities. No pleural effusion or pneumothorax. Spinal stimulator leads overlie the mid/caudal aspect of the thoracic spine. Post cholecystectomy. IMPRESSION: 1. Continued improved aeration of the lungs with persistent bilateral mid lung interstitial opacities, nonspecific though given relatively rapid continued improvement, is favored to represent improving edema. 2. No new focal airspace opacities. Electronically Signed   By: Sandi Mariscal M.D.   On: 03/03/2019 09:09    Scheduled Meds: . aspirin  81 mg Oral Daily  . atenolol  50 mg Oral Daily  . cholecalciferol  5,000 Units Oral Daily  . citalopram  20 mg Oral Daily  . enoxaparin (LOVENOX) injection  40 mg Subcutaneous Q24H  . fenofibrate  160 mg Oral Daily  . fluticasone furoate-vilanterol  1 puff Inhalation Daily  . magnesium oxide  400 mg Oral Daily  . Melatonin  5 mg Oral QHS  . montelukast  10 mg Oral QHS  . QUEtiapine  12.5 mg Oral QHS  . rOPINIRole  1 mg Oral BID  . vitamin B-12  1,000 mcg Oral Daily  . zinc sulfate  220 mg Oral Daily   Continuous Infusions: . dextrose 5 % with KCl 20 mEq / L      Assessment/Plan:  1. Acute metabolic encephalopathy.  As per the husband patient is slowly improving.  Continue to hold medications that can cause altered mental status including gabapentin, oxycodone.  Repeat CT angio of the brain negative for stroke.  Case discussed with neurology for evaluation. 2. Fever and leukocytosis.  Patient having diarrhea so stool for C. difficile ordered.   Repeat chest x-ray looks more improved.  Repeat urine analysis only had 6-10 white blood cells.  Blood cultures on admission was negative.  Continue to monitor. 3. Left-sided weakness.  CT scan of the head negative.  Physical therapy and Occupational Therapy evaluations. 4. Insomnia trial of Seroquel at night. 5. Hypernatremia on D5 with KCl 6. Hypokalemia.  Replace potassium and IV 7. Hypomagnesemia replace magnesium orally 8. Enlargement of the right thyroid with coarse calcifications.  Radiologist recommended thyroid ultrasound which is an outpatient test.  TSH slightly low.  Will check a free T4. 9. Anemia, unknown cause.  Will decrease rate of IV fluids.  Check iron studies in the morning. 10. Essential hypertension on atenolol 11. Stage I sacral decubiti, present on admission 12. Not wanting to eat and poor appetite.  Start empiric PPI 13. Chronic pain has spinal stimulator  Code Status:     Code Status Orders  (From admission, onward)         Start     Ordered   03/01/19 1608  Full code  Continuous     03/01/19 1613        Code Status History    This patient has a current code status but no historical code status.   Advance Care Planning Activity     Family Communication: Spoke with husband on the phone Disposition Plan: Patient will need to eat and tolerate food and hold her electrolytes on her own prior to any disposition  Consultants:  Neurology  Time spent: 28 minutes in coordination of care speaking with husband and neurology and nursing staff.  [place]  [facility] [employer]

## 2019-03-03 NOTE — Progress Notes (Addendum)
Physical Therapy Treatment Patient Details Name: [name] MRN: [id number] DOB: [birth year] Today's Date: 03/03/2019    History of Present Illness Pt. is a 74 y.o. female with recent discharge, on 02/26/2019 from [facility] for COVID-19 infection hospitalization and altered level of consciousness comes today with altered level of consciousness.  Patient also has significant past medical history of asthma, spinal stimulator, HTN, anxiety, depression, migraines, HLD, restless leg and chronic pain managed with narcotics and other pain medication.  She was discharged to the skilled nursing facility from [facility] (hospitalized 02/14/19-02/26/19).    PT Comments    Patient with significant mental status and L attention this session. Pt oriented to self, disorientedx3, but conversational and much more appropriate, able to follow one step commands consistently. The patient was able to perform supine heel slides with verbal/tactile cues bilaterally and move LUE with purpose and on cues. Supine <> sit with maxAx2. Pt progressed from maxA-CGA sitting EOB for at least 10 minutes. CGA during functional ADL task, maintained at least 1 UE support. first attempted sit <> stand pt almost able to clear buttocks from bed with maxAx2 and handheld assist. Second attempt after significant motivation and encouragement, improved buttocks clearing but decreased upright trunk. Pt assisted to supine, and performed rolling with mod-maxA. Room set up again to address L attention. Overall the patient demonstrated great progress with mobility today and would benefit from further skilled PT.     Follow Up Recommendations  CIR     Equipment Recommendations  Other (comment)(TBD)    Recommendations for Other Services       Precautions / Restrictions Precautions Precautions: Fall Precaution Comments: spinal cord stimulator Restrictions Weight Bearing Restrictions: No    Mobility  Bed Mobility Overal bed  mobility: Needs Assistance Bed Mobility: Supine to Sit;Sit to Supine;Rolling Rolling: Max assist;+2 for safety/equipment   Supine to sit: Max assist;+2 for physical assistance;+2 for safety/equipment;HOB elevated Sit to supine: Max assist;+2 for physical assistance;+2 for safety/equipment   General bed mobility comments: pt able to grab handrail with rolling, and maintain sidelying with modA  Transfers Overall transfer level: Needs assistance Equipment used: 2 person hand held assist Transfers: Sit to/from Stand Sit to Stand: Max assist;Total assist;+2 physical assistance         General transfer comment: first attempt pt almost able to clear buttocks from bed with maxAx2 and handheld assist. Second attempt after significant motivation and encouragement, improved buttocks clearing but decreased upright trunk.  Ambulation/Gait             General Gait Details: deferred   Stairs             Wheelchair Mobility    Modified Rankin (Stroke Patients Only)       Balance Overall balance assessment: Needs assistance Sitting-balance support: Feet supported;Feet unsupported;Single extremity supported;Bilateral upper extremity supported   Sitting balance - Comments: Pt progressed from maxA-CGA sitting EOB for at least 10 minutes. CGA during functional ADL task, maintained at least 1 UE support Postural control: Posterior lean   Standing balance-Leahy Scale: Zero                              Cognition Arousal/Alertness: Awake/alert Behavior During Therapy: Flat affect Overall Cognitive Status: Impaired/Different from baseline Area of Impairment: Orientation;Attention                 Orientation Level: Disoriented to;Place;Time;Situation Current Attention Level: Focused  Following Commands: Follows one step commands consistently     Problem Solving: Slow processing;Requires verbal cues;Requires tactile cues        Exercises Other  Exercises Other Exercises: Room set up adjusted to address L inattention    General Comments        Pertinent Vitals/Pain Pain Assessment: 0-10 Faces Pain Scale: Hurts little more Pain Location: L buttocks Pain Descriptors / Indicators: Aching Pain Intervention(s): Limited activity within patient's tolerance;Monitored during session;Repositioned    Home Living                      Prior Function            PT Goals (current goals can now be found in the care plan section) Progress towards PT goals: Progressing toward goals    Frequency    Min 2X/week      PT Plan Current plan remains appropriate    Co-evaluation PT/OT/SLP Co-Evaluation/Treatment: Yes Reason for Co-Treatment: Complexity of the patient's impairments (multi-system involvement);Necessary to address cognition/behavior during functional activity;For patient/therapist safety;To address functional/ADL transfers PT goals addressed during session: Mobility/safety with mobility;Strengthening/ROM;Balance OT goals addressed during session: ADL's and self-care      AM-PAC PT "6 Clicks" Mobility   Outcome Measure  Help needed turning from your back to your side while in a flat bed without using bedrails?: Total Help needed moving from lying on your back to sitting on the side of a flat bed without using bedrails?: Total Help needed moving to and from a bed to a chair (including a wheelchair)?: Total Help needed standing up from a chair using your arms (e.g., wheelchair or bedside chair)?: Total Help needed to walk in hospital room?: Total Help needed climbing 3-5 steps with a railing? : Total 6 Click Score: 6    End of Session Equipment Utilized During Treatment: Gait belt Activity Tolerance: Patient tolerated treatment well Patient left: in bed;with call bell/phone within reach;with bed alarm set;with family/visitor present Nurse Communication: Mobility status PT Visit Diagnosis: Other  abnormalities of gait and mobility (R26.89);Difficulty in walking, not elsewhere classified (R26.2);Muscle weakness (generalized) (M62.81);Other symptoms and signs involving the nervous system (R29.898)     Time: [id number] PT Time Calculation (min) (ACUTE ONLY): 49 min  Charges:  $Therapeutic Exercise: 8-22 mins $Therapeutic Activity: 8-22 mins                     Lieutenant Diego PT, DPT 4:33 PM,03/03/19

## 2019-03-03 NOTE — Progress Notes (Signed)
When RN went in to assess patient, patient reports that she is hurting but refused all medication offered by this RN. Patient states that she does not want to be touched by anyone, also refusing vital signs. This RN repeatedly offered pain medication and nonmedicinal interventions but patient refused everything. RN encouraged patient to call if she needs anything to which she replied " I dont think I will be needing anything". Will continue to monitor.

## 2019-03-03 NOTE — Progress Notes (Addendum)
Occupational Therapy Treatment Patient Details Name: [name] MRN: [id number] DOB: 01/03/1946 Today's Date: 03/03/2019    History of present illness Pt. is a 74 y.o. female with recent discharge, on 02/26/2019 from [facility] for COVID-19 infection hospitalization and altered level of consciousness comes today with altered level of consciousness.  Patient also has significant past medical history of asthma, spinal stimulator, HTN, anxiety, depression, migraines, HLD, restless leg and chronic pain managed with narcotics and other pain medication.  She was discharged to the skilled nursing facility from [facility] (hospitalized 02/14/19-02/26/19).   OT comments  Pt. was assisted with repositioning as pt.'s head, and neck were laterally flexed to the far right upon arrival. Pt. was assisted with positioning of her head, and neck into midline with pillows, and towel rolls for support at the right side. Pt. And caregiver education was provided about positioning. Pt. continues to present with limited awareness to her left side. Pt. Presented with confusion at times throughout the session. Pt. Has a poor appetite, and declined breakfast. Pt. continues benefit from OT services for ADL training, A/E training, positioning, visual perceptual functioning, neuromuscular re-education, and pt. education about cognitive compensatory strategies, education about home modification, and DME. Pt. continues to benefit from follow-up OT services upon discharge in order to maximize independence with ADLs, and IADLs.   Follow Up Recommendations  CIR    Equipment Recommendations  3 in 1 bedside commode    Recommendations for Other Services Rehab consult    Precautions / Restrictions Precautions Precautions: Fall Restrictions Weight Bearing Restrictions: No       Mobility Bed Mobility Overal bed mobility: Needs Assistance             General bed mobility comments: Total assist for  positioning  Transfers                 General transfer comment: Deferred: Pt is unable    Balance                                           ADL either performed or assessed with clinical judgement   ADL Overall ADL's : Needs assistance/impaired Eating/Feeding: Maximal assistance;Bed level   Grooming: Maximal assistance;Bed level       Lower Body Bathing: Set up;Total assistance   Upper Body Dressing : Set up;Total assistance   Lower Body Dressing: Maximal assistance;Total assistance   Toilet Transfer: Cueing for sequencing;Total assistance   Toileting- Clothing Manipulation and Hygiene: Maximal assistance;Total assistance         General ADL Comments: Pt. is max-total assist for all ADLs.     Vision Baseline Vision/History: Wears glasses Wears Glasses: At all times Patient Visual Report: Blurring of vision Vision Assessment?: Yes Eye Alignment: Within Functional Limits Ocular Range of Motion: Restricted on the left Alignment/Gaze Preference: Head turned;Head tilt;Gaze right Tracking/Visual Pursuits: Impaired - to be further tested in functional context Visual Fields: Impaired   Perception     Praxis      Cognition Arousal/Alertness: Awake/alert Behavior During Therapy: Restless Overall Cognitive Status: Impaired/Different from baseline Area of Impairment: Orientation;Attention;Following commands;Safety/judgement;Awareness;Problem solving                 Orientation Level: Disoriented to;Place;Time;Situation Current Attention Level: Focused   Following Commands: Follows one step commands inconsistently Safety/Judgement: Decreased awareness of deficits Awareness: Intellectual Problem Solving: Slow processing;Difficulty sequencing;Requires verbal  cues;Requires tactile cues;Decreased initiation General Comments: Decreased left sided awareness.        Exercises Other Exercises Other Exercises: Pt./caregiver education  about positioining   Shoulder Instructions       General Comments      Pertinent Vitals/ Pain       Pain Assessment: 0-10 Pain Score: 10  Faces Pain Scale: Hurts  The worst Pain Descriptors / Indicators:  Aching Pain Intervention(s): Limited activity within patient's tolerance;Monitored during session;Repositioned  Home Living Family/patient expects to be discharged to:: Private residence Living Arrangements: Spouse/significant other Available Help at Discharge: Family Type of Home: House Home Access: Ramped entrance     Home Layout: One level     Bathroom Shower/Tub: Chief Strategy Officer: Shower seat;Grab bars - tub/shower;Walker - 4 wheels          Prior Functioning/Environment Level of Independence: Needs assistance  Gait / Transfers Assistance Needed: Per spouse, before recent Covid and [facility] admission, pt was mod indep with rollator for [facility] mobility (did not use consistently all the time, spouse reminded) ADL's / Homemaking Assistance Needed: Pt able to dress herself, light meal prep, spouse provided assist with shower transfers for safety and washed her back, pt managed own medications, was driving; spouse did the cleaning 2/2 pt's chronic back pain       Frequency  Min 3X/week        Progress Toward Goals  OT Goals(current goals can now be found in the care plan section)     Acute Rehab OT Goals Patient Stated Goal: Return to PLOF OT Goal Formulation: With patient/family Potential to Achieve Goals: Good  Plan      Co-evaluation                 AM-PAC OT "6 Clicks" Daily Activity     Outcome Measure   Help from another person eating meals?: A Lot Help from another person taking care of personal grooming?: A Lot Help from another person toileting, which includes using toliet, bedpan, or urinal?: Total Help from another person bathing (including washing, rinsing, drying)?: Total Help from another person to put on and  taking off regular upper body clothing?: Total Help from another person to put on and taking off regular lower body clothing?: Total 6 Click Score: 8    End of Session    OT Visit Diagnosis: Other abnormalities of gait and mobility (R26.89);Low vision, both eyes (H54.2);Muscle weakness (generalized) (M62.81);Other symptoms and signs involving cognitive function;Hemiplegia and hemiparesis Hemiplegia - Right/Left: Left Hemiplegia - dominant/non-dominant: Dominant   Activity Tolerance Patient tolerated treatment well   Patient Left in bed;with call bell/phone within reach;with bed alarm set;with family/visitor present   Nurse Communication Mobility status;Other (comment)        [id number] OT Time Calculation (min): 23 min  Charges: OT General Charges $OT Visit: 1 Visit OT Treatments $Self Care/Home Management : 23-37 mins  Harrel Carina, MS, OTR/L    Harrel Carina 03/03/2019, 11:54 AM

## 2019-03-03 NOTE — Progress Notes (Signed)
Occupational Therapy Treatment Patient Details Name: [name] MRN: [id number] DOB: 06-Jun-1945 Today's Date: 03/03/2019    History of present illness Pt. is a 74 y.o. female with recent discharge, on 02/26/2019 from [place] for COVID-19 infection hospitalization and altered level of consciousness comes today with altered level of consciousness.  Patient also has significant past medical history of asthma, spinal stimulator, HTN, anxiety, depression, migraines, HLD, restless leg and chronic pain managed with narcotics and other pain medication.  She was discharged to the skilled nursing facility from [facility] (hospitalized 02/14/19-02/26/19).   OT comments  Ms. Cacciola was seen for OT/PT co-treatment on this date. Upon arrival to room pt awake/alert (oriented to self only), with PT in room for session. Pt agreeable to OT assisting with bed mobility. She states "I would love to" when asked if she would like to perform oral care once seated EOB. OT engages pt in seated ADL tasks as described below (See ADL section for detail). Pt tolerates bed mobility and standing attempts well this date, but does requires encouragement for second standing trial as she endorses increased fatigue and desire to return to bed at end of session. Pt husband present at bedside t/o session and readily assists with encouragement and pt education during session. Pt and caregiver educated on environmental strategies for mgt of L inattention this date. Pt room furniture re-arranged to maximize pt attention to L side. Pt and caregiver endorse desire to see pt return to her PLOF ASAP. Pt continues to benefit from skilled OT services to maximize return to PLOF and minimize risk of future falls, injury, caregiver burden, and readmission. Will continue to follow POC. Continue to recommend CIR for acute intensive rehabilitation upon hospital DC.   Follow Up Recommendations  CIR    Equipment Recommendations  3 in 1  bedside commode    Recommendations for Other Services Rehab consult    Precautions / Restrictions Precautions Precautions: Fall Precaution Comments: spinal cord stimulator Restrictions Weight Bearing Restrictions: No       Mobility Bed Mobility Overal bed mobility: Needs Assistance Bed Mobility: Supine to Sit;Sit to Supine;Rolling Rolling: Max assist;+2 for safety/equipment   Supine to sit: Max assist;+2 for physical assistance;+2 for safety/equipment;HOB elevated Sit to supine: Max assist;+2 for physical assistance;+2 for safety/equipment   General bed mobility comments: pt able to grab handrail with rolling, and maintain sidelying with modA  Transfers Overall transfer level: Needs assistance Equipment used: 2 person hand held assist Transfers: Sit to/from Stand Sit to Stand: Max assist;Total assist;+2 physical assistance         General transfer comment: first attempt pt almost able to clear buttocks from bed with maxAx2 and handheld assist. Second attempt after significant motivation and encouragement, improved buttocks clearing but decreased upright trunk.    Balance Overall balance assessment: Needs assistance Sitting-balance support: Feet supported;Feet unsupported;Single extremity supported;Bilateral upper extremity supported Sitting balance-Leahy Scale: Fair Sitting balance - Comments: Pt progressed from maxA-CGA sitting EOB for at least 10 minutes. CGA during functional ADL task, maintained at least 1 UE support Postural control: Posterior lean   Standing balance-Leahy Scale: Zero Standing balance comment: Pt unable to stand w/o significant support +2 this date.                           ADL either performed or assessed with clinical judgement   ADL Overall ADL's : Needs assistance/impaired  General ADL Comments: Pt. continues to require max-total assist for all ADLs.. Is able to sit at EOB to  perform Oral care with heavy cueing, and moderate assist from therapist this date. Pt able to apply a very small amt of toothpaste to toothbrush with LUE, but continues to demonstrate poor functional use of this extremity. She uses her RUE to brush briefly, and requires physical assist to maintain sitting balance during seated task.     Vision Baseline Vision/History: Wears glasses Wears Glasses: At all times Tracking/Visual Pursuits: Impaired - to be further tested in functional context Visual Fields: Impaired-to be further tested in functional context   Perception     Praxis      Cognition Arousal/Alertness: Awake/alert Behavior During Therapy: Flat affect Overall Cognitive Status: Impaired/Different from baseline Area of Impairment: Orientation;Attention                 Orientation Level: Disoriented to;Place;Time;Situation Current Attention Level: Focused   Following Commands: Follows one step commands inconsistently Safety/Judgement: Decreased awareness of deficits   Problem Solving: Slow processing;Requires verbal cues;Requires tactile cues General Comments: Pt with fair attention when presented with visual and verbal stimuli on R side, but demonstrates L side neglect, requires multimodal cues for 1 step commands        Exercises Other Exercises Other Exercises: Pt and caregiver educated on environmental modifications to address L sided inattention/neglect. Pt room set-up adjusted to support pt attendance to L side. Pt and caregiver also educated on positioning of LUE to maximize functional use and safe positioning Other Exercises: OT engages pt in seated grooming task at EOB with PT to provide +2 assist for seated balance this date. OT provides +2 assist for mobility attempts.   Shoulder Instructions       General Comments      Pertinent Vitals/ Pain       Pain Assessment: 0-10 Pain Score: 10-Worst pain ever Faces Pain Scale: Hurts little more Pain Location:  L buttocks Pain Descriptors / Indicators: Aching Pain Intervention(s): Limited activity within patient's tolerance;Monitored during session;Repositioned  Home Living                                          Prior Functioning/Environment              Frequency           Progress Toward Goals  OT Goals(current goals can now be found in the care plan section)  Progress towards OT goals: Progressing toward goals  Acute Rehab OT Goals Patient Stated Goal: Return to PLOF OT Goal Formulation: With patient/family Time For Goal Achievement: 03/16/19 Potential to Achieve Goals: Good  Plan Discharge plan remains appropriate;Frequency remains appropriate    Co-evaluation    PT/OT/SLP Co-Evaluation/Treatment: Yes Reason for Co-Treatment: Complexity of the patient's impairments (multi-system involvement);Necessary to address cognition/behavior during functional activity;For patient/therapist safety;To address functional/ADL transfers PT goals addressed during session: Mobility/safety with mobility;Strengthening/ROM;Balance OT goals addressed during session: ADL's and self-care      AM-PAC OT "6 Clicks" Daily Activity     Outcome Measure   Help from another person eating meals?: A Lot Help from another person taking care of personal grooming?: A Lot Help from another person toileting, which includes using toliet, bedpan, or urinal?: Total Help from another person bathing (including washing, rinsing, drying)?: A Lot Help from another person to put on and  taking off regular upper body clothing?: A Lot Help from another person to put on and taking off regular lower body clothing?: Total 6 Click Score: 10    End of Session Equipment Utilized During Treatment: Gait belt  OT Visit Diagnosis: Other abnormalities of gait and mobility (R26.89);Low vision, both eyes (H54.2);Muscle weakness (generalized) (M62.81);Other symptoms and signs involving cognitive  function;Hemiplegia and hemiparesis Hemiplegia - Right/Left: Left Hemiplegia - dominant/non-dominant: Dominant Hemiplegia - caused by: Unspecified   Activity Tolerance Patient tolerated treatment well   Patient Left in bed;with call bell/phone within reach;with bed alarm set;with family/visitor present   Nurse Communication          Time: 0209-0243 OT Time Calculation (min): 34 min  Charges: OT General Charges $OT Visit: 1 Visit OT Treatments $Self Care/Home Management : 8-22 mins  Shara Blazing, M.S., OTR/L Ascom: [phone] 03/03/19, 4:49 PM

## 2019-03-03 NOTE — Progress Notes (Signed)
PT Cancellation Note  Patient Details Name: [name] MRN: [id number] DOB: Oct 08, 1945   Cancelled Treatment:    Reason Eval/Treat Not Completed: Other (comment)(RN and CNA entered room to bathe patient at this time, RN also informed PT patient is preparing for transport to [place]. PT to follow up as able.)   Lieutenant Diego PT, DPT 12:03 PM,03/03/19

## 2019-03-04 ENCOUNTER — Inpatient Hospital Stay: Payer: Medicare Other

## 2019-03-04 DIAGNOSIS — R404 Transient alteration of awareness: Secondary | ICD-10-CM | POA: Diagnosis not present

## 2019-03-04 DIAGNOSIS — G9341 Metabolic encephalopathy: Principal | ICD-10-CM

## 2019-03-04 DIAGNOSIS — D72829 Elevated white blood cell count, unspecified: Secondary | ICD-10-CM | POA: Diagnosis not present

## 2019-03-04 DIAGNOSIS — B370 Candidal stomatitis: Secondary | ICD-10-CM

## 2019-03-04 DIAGNOSIS — E876 Hypokalemia: Secondary | ICD-10-CM | POA: Diagnosis not present

## 2019-03-04 DIAGNOSIS — R531 Weakness: Secondary | ICD-10-CM | POA: Diagnosis not present

## 2019-03-04 LAB — IRON AND TIBC
Iron: 22 ug/dL — ABNORMAL LOW (ref 28–170)
Saturation Ratios: 13 % (ref 10.4–31.8)
TIBC: 176 ug/dL — ABNORMAL LOW (ref 250–450)
UIBC: 154 ug/dL

## 2019-03-04 LAB — FERRITIN: Ferritin: 432 ng/mL — ABNORMAL HIGH (ref 11–307)

## 2019-03-04 LAB — THYROID PANEL WITH TSH
Free Thyroxine Index: 2.4 (ref 1.2–4.9)
T3 Uptake Ratio: 33 % (ref 24–39)
T4, Total: 7.3 ug/dL (ref 4.5–12.0)
TSH: 0.1 u[IU]/mL — ABNORMAL LOW (ref 0.450–4.500)

## 2019-03-04 LAB — BASIC METABOLIC PANEL
Anion gap: 9 (ref 5–15)
BUN: 9 mg/dL (ref 8–23)
CO2: 23 mmol/L (ref 22–32)
Calcium: 8.4 mg/dL — ABNORMAL LOW (ref 8.9–10.3)
Chloride: 109 mmol/L (ref 98–111)
Creatinine, Ser: 0.34 mg/dL — ABNORMAL LOW (ref 0.44–1.00)
GFR calc Af Amer: >60 mL/min (ref >60)
GFR calc non Af Amer: >60 mL/min (ref >60)
Glucose, Bld: 120 mg/dL — ABNORMAL HIGH (ref 70–99)
Potassium: 3.5 mmol/L (ref 3.5–5.1)
Sodium: 141 mmol/L (ref 135–145)

## 2019-03-04 LAB — OCCULT BLOOD X 1 CARD TO LAB, STOOL: Fecal Occult Bld: NEGATIVE

## 2019-03-04 LAB — C DIFFICILE QUICK SCREEN W PCR REFLEX
C Diff antigen: NEGATIVE
C Diff interpretation: NOT DETECTED
C Diff toxin: NEGATIVE

## 2019-03-04 LAB — T4, FREE: Free T4: 1.62 ng/dL — ABNORMAL HIGH (ref 0.61–1.12)

## 2019-03-04 LAB — URINE CULTURE: Culture: NO GROWTH

## 2019-03-04 LAB — MAGNESIUM: Magnesium: 1.3 mg/dL — ABNORMAL LOW (ref 1.7–2.4)

## 2019-03-04 LAB — VITAMIN B12: Vitamin B-12: 1007 pg/mL — ABNORMAL HIGH (ref 180–914)

## 2019-03-04 LAB — PHOSPHORUS: Phosphorus: 2.9 mg/dL (ref 2.5–4.6)

## 2019-03-04 MED ORDER — POTASSIUM CL IN DEXTROSE 5% 20 MEQ/L IV SOLN
20.0000 meq | INTRAVENOUS | Status: DC
  Administered 2019-03-04 – 2019-03-05 (×2): 20 meq via INTRAVENOUS
  Filled 2019-03-04 (×3): qty 1000

## 2019-03-04 MED ORDER — MAGNESIUM SULFATE 4 GM/100ML IV SOLN
4.0000 g | Freq: Once | INTRAVENOUS | Status: AC
  Administered 2019-03-04: 4 g via INTRAVENOUS
  Filled 2019-03-04: qty 100

## 2019-03-04 MED ORDER — FLUCONAZOLE 100MG IVPB
100.0000 mg | INTRAVENOUS | Status: DC
  Administered 2019-03-05: 100 mg via INTRAVENOUS
  Filled 2019-03-04 (×2): qty 50

## 2019-03-04 MED ORDER — METHIMAZOLE 5 MG PO TABS
5.0000 mg | ORAL_TABLET | Freq: Every day | ORAL | Status: DC
  Administered 2019-03-04 – 2019-03-05 (×2): 5 mg via ORAL
  Filled 2019-03-04 (×2): qty 1

## 2019-03-04 MED ORDER — POTASSIUM CHLORIDE CRYS ER 20 MEQ PO TBCR
40.0000 meq | EXTENDED_RELEASE_TABLET | Freq: Once | ORAL | Status: AC
  Administered 2019-03-04: 40 meq via ORAL
  Filled 2019-03-04: qty 2

## 2019-03-04 MED ORDER — LOPERAMIDE HCL 2 MG PO CAPS
2.0000 mg | ORAL_CAPSULE | Freq: Three times a day (TID) | ORAL | Status: DC | PRN
  Administered 2019-03-04: 2 mg via ORAL
  Filled 2019-03-04: qty 1

## 2019-03-04 MED ORDER — FLUCONAZOLE IN SODIUM CHLORIDE 200-0.9 MG/100ML-% IV SOLN
200.0000 mg | Freq: Once | INTRAVENOUS | Status: AC
  Administered 2019-03-04: 200 mg via INTRAVENOUS
  Filled 2019-03-04: qty 100

## 2019-03-04 NOTE — Progress Notes (Addendum)
Speech Language Pathology Treatment: Dysphagia  Patient Details Name: [name] MRN: [id number] DOB: [birth year] Today's Date: 03/04/2019 Time: [id number] SLP Time Calculation (min) (ACUTE ONLY): 40 min  Assessment / Plan / Recommendation Clinical Impression  Pt seen today for ongoing assessment of swallowing; toleration of diet and trials to upgrade diet if appropriate. Pt sitting in chair post PT/OT session. Pt's cognition and speech-language appear much improved; also noted more head turning to her Left(though stiff and min restricted) as well. Pt more appropriate in her verbal communication and is better able to articulate thoughts and wants/needs. Pt continues to exhibit intermittent deficits in expressive and receptive language tasks -- she would benefit from a Formal Cognitive-linguistic evaluations as she discharges to Rehab(in a more structured environment) -- when Acuity of Acute metabolic encephalopathy improves more. (MD is conntinuing to hold medications that can cause altered mental status including gabapentin, oxycodone. CT angio of the brain negative for stroke.) Pt's Husband feels pt is improved today. Repeat chest x-ray looks much improved w/ no fever today per MD note. Pt continues to be easily overwhelmed with increased stimuli in room w/ improved performance noted when environmental distractions were reduced and w/ 1:1 engagement during tasks. PO trials of mech soft foods placed on tray table in front of pt. Encouraged pt to do self-feeding at her own pace. Pt consumed trials of mech soft foods w/ Min+ oral phase deficits c/b min prolonged oral phase and oral mastication effort. Given time, pt was able to complete mastication of solids, complete A-P transfer, and orally clear. Pt utilized lingual sweeping independently to completely clear mouth. SLP encouraged alternating foods/liquids also. No overt clinical s/s of aspiration noted w/ trials of mech soft foods, thin liquids  via Straw. No decline in vocal quality or respiratory effort noted during/post trials.  Education w/ pt and Husband completed on general aspiration precautions; swallowing and dysphagia as impacted by illness; food/diet consistencies -- modification, use of condiments, moistened foods. Suggested foods easy to eat for conservation of energy but also foods of interest to pt to stimulate desire/appetite d/t reduced oral intake. Husband and pt agreed. ST services will f/u next 1-2 days for toleration of upgraded diet; education as needed. NSG updated.     HPI HPI: Pt is a 74 y.o. female has a h/o multiple issues including chronic back pain managed w/ Narcotics and pain medications, GERD, Hiatal Hernia with recent discharge, on 02/26/2019 from [facility] for COVID-19 infection hospitalization and altered level of consciousness comes today with altered level of consciousness.  Patient also has significant past medical history of asthma, HTN, and chronic pain managed with Narcotics and other pain medication.  She was discharged to the skilled nursing facility from [facility]  At SNF, pt became more lethargic w/ an altered level of consciousness and also having decreased p.o. intake.  In the emergency room, patient husband was sitting w/ patient. He stated patient is somewhat less responsive but thinks she is recognizing him though is not verbally communicative with him as she did about a week or 2 ago.  Head CT:  Negative for Acute but "Chronic diffuse atrophy. Chronic bilateral periventricular white matter small vessel ischemic changes". CXR improved from previous w/ bilateral airspace dis.  Noted Labs in chart.       SLP Plan  Continue with current plan of care       Recommendations  Diet recommendations: Dysphagia 3 (mechanical soft);Thin liquid(well-cut meats w/ gravies) Liquids provided via: Cup;Straw(monitor) Medication Administration:  Whole meds with puree(for easier, safer swallowing; Crushed  if necessary) Supervision: Patient able to self feed;Staff to assist with self feeding;Full supervision/cueing for compensatory strategies Compensations: Minimize environmental distractions;Slow rate;Small sips/bites;Lingual sweep for clearance of pocketing;Multiple dry swallows after each bite/sip;Follow solids with liquid Postural Changes and/or Swallow Maneuvers: Seated upright 90 degrees;Upright 30-60 min after meal                General recommendations: (Dietician f/u) Oral Care Recommendations: Oral care BID;Oral care before and after PO;Staff/trained caregiver to provide oral care Follow up Recommendations: Skilled Nursing facility(TBD) SLP Visit Diagnosis: Dysphagia, oropharyngeal phase (R13.12)(also note Cognitive-linguistic deficits - needs eval) Plan: Continue with current plan of care       [place], [place], [place] 03/04/2019, 3:32 PM

## 2019-03-04 NOTE — Consult Note (Signed)
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 ratio:  0.69       1.5  Dwayne Prince Rome MD Electronically signed by Yolonda Kida MD Signature Date/Time: 03/03/2019/8:54:18 PM    Final     Assessment/Plan: Diagnosis: Acute metabolic encephalopathy 1. Does the need for close, 24 hr/day medical supervision in concert with the patient's rehab needs make it unreasonable for this patient to be served in a less intensive setting? Yes 2. Co-Morbidities requiring supervision/potential complications: asthma, spinal stimulator, HTN, anxiety, depression, migraines, HLD, restless leg syndrome, chronic pain syndrome, recent COVID-19 infection 3. Due to bladder management, bowel management, safety, skin/wound care, disease management, medication administration, pain management and patient education, does the patient require 24 hr/day rehab nursing? Yes 4. Does the patient require coordinated care of a physician, rehab nurse, therapy disciplines of PT, OT, SLP to address physical and functional deficits in the context of the above medical diagnosis(es)? Yes Addressing deficits in the following areas: balance, endurance, locomotion, strength, transferring, bowel/bladder control, bathing, dressing, feeding, grooming, toileting, cognition and psychosocial support 5. Can the patient actively participate in an intensive therapy  program of at least 3 hrs of therapy per day at least 5 days per week? Yes 6. The potential for patient to make measurable gains while on inpatient rehab is good 7. Anticipated functional outcomes upon discharge from inpatient rehab are min assist  with PT, min assist with OT, independent with SLP. 8. Estimated rehab length of stay to reach the above functional goals is: 2-3 weeks 9. Anticipated discharge destination: Home 10. Overall Rehab/Functional Prognosis: good  RECOMMENDATIONS: This patient's condition is appropriate for continued rehabilitative care in the following setting: CIR Patient has agreed to participate in recommended program. Yes Note that insurance prior authorization may be required for reimbursement for recommended care.  Comment: Reviewed neurology, PT, IM, OT notes: willing to participate in therapy with good attitude. MaxA x2 for transfers and bed mobility. Ambulation deferred. Care discussed with CIR admission coordinator Danne Baxter, PT Lieutenant Diego, and OT Jeni Salles.  Disposition: Patient would be an excellent CIR candidate. Prior to getting COVID in January, she was independent and driving. She has an excellent support system of her husband who will take time off of work to help her. She is motivated and agreeable to participate in therapy and her cognitive status is improving as per her husband. Discussed with above team that our goal is for patient to demonstrate increased mobility during therapy, such as transfer to chair, in order to obtain insurance approval for CIR admission.   Chronic low back pain: Please apply heating pad for comfort. Can consider lidocaine pathc as well.   Constipation: Patient states she is not moving bowels regularly, and this could be contributing to her decreased appetite. Consider addition of senna-docusate BID to treat constipation.  Decreased appetite: If fails to improve when moving bowels regularly, consider addition of  Megace to stimulate appetite.   Thank you for this consult. I will continue to follow in [name]'s care.   Izora Ribas, MD 03/04/2019

## 2019-03-04 NOTE — Progress Notes (Signed)
Patient ID: [name], female   DOB: 04/27/1945, 74 y.o.   MRN: [id number] [facility] PROGRESS NOTE  [name] [id number] DOB: 21-Jul-1945 DOA: 03/01/2019 PCP: Randel Books, FNP  HPI/Subjective: Patient slept a little bit more last night.  Feels a little bit better today.  Had some diarrhea overnight.  Objective: Vitals:   03/04/19 0807 03/04/19 1248  BP: (!) 151/73 (!) 130/55  Pulse: 95 76  Resp: 18 18  Temp: 98.4 F (36.9 C) (!) 97.4 F (36.3 C)  SpO2: 98% 96%    Intake/Output Summary (Last 24 hours) at 03/04/2019 1442 Last data filed at 03/04/2019 0600 Gross per 24 hour  Intake 1799.94 ml  Output 300 ml  Net 1499.94 ml   Filed Weights   03/01/19 2142  Weight: 74.6 kg    ROS: Review of Systems  Constitutional: Negative for chills and fever.  Eyes: Negative for blurred vision.  Respiratory: Positive for shortness of breath. Negative for cough.   Cardiovascular: Negative for chest pain.  Gastrointestinal: Positive for abdominal pain and diarrhea. Negative for constipation, nausea and vomiting.  Genitourinary: Negative for dysuria.  Musculoskeletal: Positive for neck pain. Negative for joint pain.  Neurological: Negative for dizziness and headaches.   Exam: Physical Exam  HENT:  Nose: No mucosal edema.  Mouth/Throat: No oropharyngeal exudate or posterior oropharyngeal edema.  Eyes: Pupils are equal, round, and reactive to light. Conjunctivae and lids are normal.  Neck: Carotid bruit is not present.  Cardiovascular: S1 normal and S2 normal. Exam reveals no gallop.  No murmur heard. Respiratory: No respiratory distress. She has decreased breath sounds in the right lower field and the left lower field. She has no wheezes. She has no rhonchi. She has no rales.  GI: Soft. Bowel sounds are normal. There is no abdominal tenderness.  Musculoskeletal:     Right ankle: No swelling.     Left ankle: No swelling.  Lymphadenopathy:    She has  no cervical adenopathy.  Neurological: She is alert. No cranial nerve deficit.  Left side power 4 out of 5.  Right-sided power 5 out of 5  Skin: Skin is warm. No rash noted. Nails show no clubbing.  Psychiatric:  Answers some questions appropriately.      Data Reviewed: Basic Metabolic Panel: Recent Labs  Lab 03/01/19 1408 03/01/19 1656 03/02/19 0559 03/02/19 0909 03/02/19 1457 03/04/19 0648  NA 149*  --  148*  --   --  141  K 3.2*  --  2.8*  --  3.3* 3.5  CL 114*  --  113*  --   --  109  CO2 26  --  24  --   --  23  GLUCOSE 147*  --  144*  --   --  120*  BUN 16  --  16  --   --  9  CREATININE 0.59 0.35* 0.41*  --   --  0.34*  CALCIUM 8.8*  --  8.4*  --   --  8.4*  MG  --   --   --  1.6*  --  1.3*  PHOS  --   --   --   --   --  2.9   Liver Function Tests: Recent Labs  Lab 03/01/19 1408  AST 15  ALT 28  ALKPHOS 54  BILITOT 0.7  PROT 6.9  ALBUMIN 2.5*    Recent Labs  Lab 03/02/19 0909  AMMONIA 17   CBC: Recent Labs  Lab 03/01/19 1408 03/01/19 1656 03/02/19 0559 03/03/19 0708  WBC 20.2* 19.2* 16.2* 13.6*  NEUTROABS 17.3*  --   --  10.9*  HGB 10.5* 10.4* 9.7* 9.0*  HCT 34.3* 33.3* 31.4* 29.1*  MCV 92.2 91.7 91.5 91.2  PLT 357 312 297 256    CBG: Recent Labs  Lab 03/01/19 1413  GLUCAP 122*    Recent Results (from the past 240 hour(s))  Blood culture (routine x 2)     Status: None (Preliminary result)   Collection Time: 03/01/19  3:41 PM   Specimen: BLOOD  Result Value Ref Range Status   Specimen Description BLOOD LEFT ANTECUBITAL  Final   Special Requests   Final    BOTTLES DRAWN AEROBIC AND ANAEROBIC Blood Culture adequate volume   Culture   Final    NO GROWTH 3 DAYS Performed at [facility], [address] [place], [place] [address]    Report Status PENDING  Incomplete  Blood culture (routine x 2)     Status: None (Preliminary result)   Collection Time: 03/01/19  3:41 PM   Specimen: BLOOD  Result Value Ref Range Status    Specimen Description BLOOD BLOOD RIGHT FOREARM  Final   Special Requests   Final    BOTTLES DRAWN AEROBIC AND ANAEROBIC Blood Culture results may not be optimal due to an inadequate volume of blood received in culture bottles   Culture   Final    NO GROWTH 3 DAYS Performed at [facility], [address], [place], [place] [address]    Report Status PENDING  Incomplete  Urine Culture     Status: Abnormal   Collection Time: 03/02/19 10:12 AM   Specimen: Urine, Clean Catch  Result Value Ref Range Status   Specimen Description   Final    URINE, CLEAN CATCH Performed at [facility], [address], [place], [place] [address]    Special Requests   Final    NONE Performed at [facility], [address], [place], [place] [address]    Culture (A)  Final    <10,000 COLONIES/mL INSIGNIFICANT GROWTH Performed at [facility], [place] [address], [place], [place] [address]    Report Status 03/03/2019 FINAL  Final  Urine Culture     Status: None   Collection Time: 03/03/19  8:05 AM   Specimen: Urine, Random  Result Value Ref Range Status   Specimen Description   Final    URINE, RANDOM Performed at [facility], [address], [place], [place] [address]    Special Requests   Final    NONE Performed at [facility], [address], [place], [place] [address]    Culture   Final    NO GROWTH Performed at [facility], [address] [place], [place] [address]    Report Status 03/04/2019 FINAL  Final  C Difficile Quick Screen w PCR reflex     Status: None   Collection Time: 03/04/19  8:29 AM  Result Value Ref Range Status   C Diff antigen NEGATIVE NEGATIVE Final   C Diff toxin NEGATIVE NEGATIVE Final   C Diff interpretation No C. difficile detected.  Final    Comment: Performed at [facility], [address] [place], [place] [address]     Studies: CT ANGIO HEAD W OR WO CONTRAST  Result Date:  03/03/2019 CLINICAL DATA:  Acute neuro deficit. Rule out stroke. Recent COVID-19 EXAM: CT ANGIOGRAPHY HEAD  TECHNIQUE: Multidetector CT imaging of the head was performed using the standard protocol during bolus administration of intravenous contrast. Multiplanar CT image reconstructions and MIPs were obtained to evaluate the vascular anatomy. CONTRAST:  24mL OMNIPAQUE IOHEXOL 350 MG/ML SOLN COMPARISON:  CT head 03/01/2019 FINDINGS: CT HEAD Brain: Mild atrophy. Negative for hydrocephalus. Negative for acute infarct, hemorrhage, mass. No fluid collection or midline shift. Vascular: Negative for hyperdense vessel Skull: Negative Sinuses: Mild mucosal edema sphenoid sinus. Remaining sinuses clear. Right mastoid effusion. Orbits: Negative CTA HEAD Anterior circulation: Atherosclerotic calcification in the cavernous carotid bilaterally without significant stenosis. Anterior and middle cerebral arteries widely patent without stenosis or aneurysm. Posterior circulation: Both vertebral arteries patent to the basilar. PICA patent bilaterally. Basilar widely patent. AICA, superior cerebellar, posterior cerebral arteries patent bilaterally without stenosis or aneurysm. Venous sinuses: Normal venous enhancement Anatomic variants: None IMPRESSION: No acute intracranial abnormality Negative CTA head Electronically Signed   By: Franchot Gallo M.D.   On: 03/03/2019 12:45   CT CERVICAL SPINE W CONTRAST  Result Date: 03/03/2019 CLINICAL DATA:  Neck pain acute.  Rule out infection. EXAM: CT CERVICAL SPINE WITH CONTRAST TECHNIQUE: Multidetector CT imaging of the cervical spine was performed during intravenous contrast administration. Multiplanar CT image reconstructions were also generated. CONTRAST:  55mL OMNIPAQUE IOHEXOL 350 MG/ML SOLN COMPARISON:  MRI cervical spine 07/21/2012 FINDINGS: Alignment: Mild anterolisthesis C4-5 and C5-6. Straightening of the cervical lordosis Skull base and vertebrae: Negative for fracture or mass. No  evidence of bone infection in the cervical spine Soft tissues and spinal canal: Right lobe of the thyroid is diffusely enlarged with heterogeneous enhancement and multiple coarse calcifications. It is difficult to determine if this is a solitary nodule or multiple adjacent nodules. Overall the right lobe of the thyroid measures 32 x 18 x 52 mm. Small left lobe without nodularity. No adenopathy in neck. No soft tissue mass or edema in the neck. Disc levels: C2-3: Mild disc degeneration and spurring on the left. Mild left foraminal narrowing due to uncinate spurring C3-4: Bilateral facet degeneration.  Mild left foraminal narrowing C4-5: Mild anterolisthesis. Asymmetric facet degeneration on the left with mild left foraminal narrowing due to spurring C5-6: Mild anterolisthesis with asymmetric facet degeneration on the left. Mild left foraminal narrowing. C6-7: Disc degeneration and spondylosis with diffuse uncinate spurring. Mild foraminal narrowing bilaterally and mild spinal stenosis C7-T1: Asymmetric facet degeneration on the right. Mild right foraminal narrowing due to spurring. Upper chest: Lung apices clear bilaterally. Other: None IMPRESSION: No acute abnormality in the cervical spine. No fracture or spinal infection Cervical spondylosis as above. Enlargement of the right lobe of the thyroid with heterogeneous enhancement multiple coarse calcifications. Possible large nodule versus goiter in the right lobe of the thyroid. Thyroid ultrasound recommended.(Ref: J Am Coll Radiol. 2015 Feb;12(2): 143-50). Electronically Signed   By: Franchot Gallo M.D.   On: 03/03/2019 12:51   US Carotid Bilateral  Result Date: 03/03/2019 CLINICAL DATA:  Weakness. Syncopal episode. History of hypertension, hyperlipidemia and smoking. EXAM: BILATERAL CAROTID DUPLEX ULTRASOUND TECHNIQUE: Pearline Cables scale imaging, color Doppler and duplex ultrasound were performed of bilateral carotid and vertebral arteries in the neck. COMPARISON:  None.  FINDINGS: Criteria: Quantification of carotid stenosis is based on velocity parameters that correlate the residual internal carotid diameter with NASCET-based stenosis levels, using the diameter of the distal internal carotid lumen as the denominator for stenosis measurement. The following velocity measurements were obtained: RIGHT ICA: 119/30 cm/sec CCA: [id number] cm/sec SYSTOLIC ICA/CCA RATIO:  1.3 ECA:  94 cm/sec LEFT ICA: 95/23 cm/sec CCA: [id number] cm/sec SYSTOLIC ICA/CCA RATIO:  1.1 ECA: 81 cm/sec RIGHT CAROTID ARTERY: There is a minimal amount of eccentric echogenic plaque within the right carotid bulb (image 16), extending to involve the origin and proximal aspects of the right internal carotid artery (image 22), not resulting in elevated peak systolic velocities within the interrogated course of the right internal carotid artery to suggest a hemodynamically significant stenosis. RIGHT VERTEBRAL ARTERY:  Antegrade flow LEFT CAROTID ARTERY: There is a minimal to moderate amount of eccentric echogenic plaque within the left carotid bulb (image 36 and 47), extending to involve the origin and proximal aspects of the left internal carotid artery (image 53), not resulting in elevated peak systolic velocities within the interrogated course of the left internal carotid artery to suggest a hemodynamically significant stenosis. LEFT VERTEBRAL ARTERY:  Antegrade flow IMPRESSION: Minimal to moderate amount of bilateral atherosclerotic plaque, left greater than right, not resulting in a hemodynamically significant stenosis within either internal carotid artery. Electronically Signed   By: Sandi Mariscal M.D.   On: 03/03/2019 09:07   DG Chest Port 1 View  Result Date: 03/03/2019 CLINICAL DATA:  Fever.  Confusion. EXAM: PORTABLE CHEST 1 VIEW COMPARISON:  03/01/2019; 02/14/2019; 06/30/2015 FINDINGS: Grossly unchanged cardiac silhouette and mediastinal contours with atherosclerotic plaque within the thoracic aorta. There is  persistent rightward deviation of the tracheal air column at the level of the thoracic aorta. There is persistent thickening the right paratracheal stripe presumably secondary prominent vasculature. Continued improved aeration of the lungs with persistent ill-defined heterogeneous opacities within the bilateral mid and lower lungs, left greater than right. No new focal airspace opacities. No pleural effusion or pneumothorax. Spinal stimulator leads overlie the mid/caudal aspect of the thoracic spine. Post cholecystectomy. IMPRESSION: 1. Continued improved aeration of the lungs with persistent bilateral mid lung interstitial opacities, nonspecific though given relatively rapid continued improvement, is favored to represent improving edema. 2. No new focal airspace opacities. Electronically Signed   By: Sandi Mariscal M.D.   On: 03/03/2019 09:09   DG Abd Portable 2V  Result Date: 03/04/2019 CLINICAL DATA:  Abdominal pain EXAM: PORTABLE ABDOMEN - 2 VIEW COMPARISON:  None. FINDINGS: Bowel gas pattern is unremarkable. No significant stool burden. Right upper quadrant surgical clips. Spinal stimulator is present. Lower lumbar degenerative changes are noted. IMPRESSION: Normal bowel gas pattern. Electronically Signed   By: Macy Mis M.D.   On: 03/04/2019 09:27   ECHOCARDIOGRAM COMPLETE  Result Date: 03/03/2019   ECHOCARDIOGRAM REPORT   Patient Name:   [name] Date of Exam: 03/03/2019 Medical Rec #:  [id number]           Height:       61.0 in Accession #:    [id number]          Weight:       164.5 lb Date of Birth:  10/09/1945          BSA:          1.74 m Patient Age:    51 years            BP:           124/61 mmHg Patient Gender: F                   HR:           77 bpm. Exam Location:  [facility] Procedure: 2D Echo, Color Doppler and Cardiac Doppler Indications:  I163.9 Stroke  History:         Patient has no prior history of Echocardiogram examinations.                  Risk Factors:Hypertension and  Dyslipidemia.  Sonographer:     Charmayne Sheer RDCS (AE) Referring Phys:  [id number] TAWFIKUL ALAM Diagnosing Phys: Yolonda Kida MD  Sonographer Comments: No subcostal window and suboptimal apical window. IMPRESSIONS  1. Left ventricular ejection fraction, by visual estimation, is 50 to 55%. The left ventricle has normal function. Normal left ventricular posterior wall thickness. There is borderline left ventricular hypertrophy.  2. Left ventricular diastolic parameters are consistent with Grade I diastolic dysfunction (impaired relaxation).  3. The left ventricle demonstrates regional wall motion abnormalities.  4. Global right ventricle has normal systolic function.The right ventricular size is normal. No increase in right ventricular wall thickness.  5. Left atrial size was normal.  6. Right atrial size was normal.  7. The mitral valve is normal in structure. Trivial mitral valve regurgitation.  8. The tricuspid valve is normal in structure.  9. The tricuspid valve is normal in structure. Tricuspid valve regurgitation is trivial. 10. The aortic valve is normal in structure. Aortic valve regurgitation is not visualized. 11. The pulmonic valve was grossly normal. Pulmonic valve regurgitation is not visualized. FINDINGS  Left Ventricle: Left ventricular ejection fraction, by visual estimation, is 50 to 55%. The left ventricle has normal function. The left ventricle demonstrates regional wall motion abnormalities. The left ventricular internal cavity size was the left ventricle is normal in size. Normal left ventricular posterior wall thickness. There is borderline left ventricular hypertrophy. Concentric left ventricular hypertrophy. Left ventricular diastolic parameters are consistent with Grade I diastolic dysfunction (impaired relaxation). Right Ventricle: The right ventricular size is normal. No increase in right ventricular wall thickness. Global RV systolic function is has normal systolic function. Left Atrium:  Left atrial size was normal in size. Right Atrium: Right atrial size was normal in size Pericardium: There is no evidence of pericardial effusion. Mitral Valve: The mitral valve is normal in structure. Trivial mitral valve regurgitation. MV peak gradient, 3.4 mmHg. Tricuspid Valve: The tricuspid valve is normal in structure. Tricuspid valve regurgitation is trivial. Aortic Valve: The aortic valve is normal in structure. Aortic valve regurgitation is not visualized. Aortic valve mean gradient measures 5.0 mmHg. Aortic valve peak gradient measures 9.2 mmHg. Aortic valve area, by VTI measures 1.71 cm. Pulmonic Valve: The pulmonic valve was grossly normal. Pulmonic valve regurgitation is not visualized. Pulmonic regurgitation is not visualized. Aorta: The aortic root is normal in size and structure. IAS/Shunts: No atrial level shunt detected by color flow Doppler.  LEFT VENTRICLE PLAX 2D LVIDd:         3.66 cm  Diastology LVIDs:         2.67 cm  LV e' lateral:   10.20 cm/s LV PW:         0.82 cm  LV E/e' lateral: 5.7 LV IVS:        0.79 cm  LV e' medial:    9.90 cm/s LVOT diam:     1.90 cm  LV E/e' medial:  5.8 LV SV:         30 ml LV SV Index:   16.67 LVOT Area:     2.84 cm  LEFT ATRIUM         Index LA diam:    3.50 cm 2.01 cm/m  AORTIC VALVE  PULMONIC VALVE AV Area (Vmax):    1.64 cm     PV Vmax:       0.99 m/s AV Area (Vmean):   1.48 cm     PV Vmean:      64.500 cm/s AV Area (VTI):     1.71 cm     PV VTI:        0.168 m AV Vmax:           152.00 cm/s  PV Peak grad:  3.9 mmHg AV Vmean:          106.000 cm/s PV Mean grad:  2.0 mmHg AV VTI:            0.216 m AV Peak Grad:      9.2 mmHg AV Mean Grad:      5.0 mmHg LVOT Vmax:         87.90 cm/s LVOT Vmean:        55.200 cm/s LVOT VTI:          0.130 m LVOT/AV VTI ratio: 0.60  AORTA Ao Root diam: 2.90 cm MITRAL VALVE MV Area (PHT): 3.48 cm             SHUNTS MV Peak grad:  3.4 mmHg             Systemic VTI:  0.13 m MV Mean grad:  1.0 mmHg              Systemic Diam: 1.90 cm MV Vmax:       0.92 m/s MV Vmean:      51.0 cm/s MV VTI:        0.21 m MV PHT:        63.22 msec MV Decel Time: 218 msec MV E velocity: 57.70 cm/s 103 cm/s MV A velocity: 83.90 cm/s 70.3 cm/s MV E/A ratio:  0.69       1.5  Dwayne D Callwood MD Electronically signed by Yolonda Kida MD Signature Date/Time: 03/03/2019/8:54:18 PM    Final     Scheduled Meds: . aspirin  81 mg Oral Daily  . atenolol  50 mg Oral Daily  . cholecalciferol  5,000 Units Oral Daily  . citalopram  20 mg Oral Daily  . enoxaparin (LOVENOX) injection  40 mg Subcutaneous Q24H  . fenofibrate  160 mg Oral Daily  . fluticasone furoate-vilanterol  1 puff Inhalation Daily  . Melatonin  5 mg Oral QHS  . montelukast  10 mg Oral QHS  . QUEtiapine  12.5 mg Oral QHS  . rOPINIRole  1 mg Oral BID  . vitamin B-12  1,000 mcg Oral Daily  . zinc sulfate  220 mg Oral Daily   Continuous Infusions: . dextrose 5 % with KCl 20 mEq / L    . [START ON 03/05/2019] fluconazole (DIFLUCAN) IV      Assessment/Plan:  1. Acute metabolic encephalopathy.  As per the husband patient is getting a little bit better every day.  Continue to hold medications that can cause altered mental status including gabapentin, oxycodone.  CT angio of the brain negative for stroke.  2. Fever and leukocytosis.  Fever has resolved. Repeat chest x-ray looks more improved.  Urine culture no growth. Blood cultures on admission was negative.  Stool for C diff negative.  Prn imodium.  Hold off on antibiotics. 3. Left-sided weakness.  CT scan of the head negative.  Physical therapy and Occupational Therapy evaluations. Patient is a good candidate for CIR. 4. Hypomagnesemia-  replace magnesium iv 5. Insomnia trial of Seroquel at night. 6. Hypernatremia.  Improved 7. Hypokalemia.  Replace in IVF 8. Hypomagnesemia replace magnesium orally 9. Enlargement of the right thyroid with coarse calcifications. Radiologist recommended thyroid ultrasound which  is an outpatient test.  TSH slightly low.  T4 slightly high.  Will start low dose methimazole. 10. Anemia, of chronic disease 11. Essential hypertension on atenolol 12. Stage I sacral decubiti, present on admission 13. Not wanting to eat and poor appetite.  Start empiric PPI 14. Chronic pain has spinal stimulator 15. Thrush and poor appetite.  IV diflucan  Code Status:     Code Status Orders  (From admission, onward)         Start     Ordered   03/01/19 1608  Full code  Continuous     03/01/19 1613        Code Status History    This patient has a current code status but no historical code status.   Advance Care Planning Activity     Family Communication: Spoke with husband on the phone Disposition Plan: Awaiting to see if insurance approves for CIR.  Patient would be a good candidate.  Consultants:  Neurology  Time spent: 29 minutes  [place]

## 2019-03-04 NOTE — Progress Notes (Signed)
Physical Therapy Treatment Patient Details Name: [name] MRN: [id number] DOB: 1945-05-22 Today's Date: 03/04/2019    History of Present Illness Pt. is a 74 y.o. female with recent discharge, on 02/26/2019 from [place] for COVID-19 infection hospitalization and altered level of consciousness comes today with altered level of consciousness.  Patient also has significant past medical history of asthma, spinal stimulator, HTN, anxiety, depression, migraines, HLD, restless leg and chronic pain managed with narcotics and other pain medication.  She was discharged to the skilled nursing facility from [facility] (hospitalized 02/14/19-02/26/19).    PT Comments    Patient alert, oriented to self, improved ability to follow commands and overall demeanor this session. Several exercises performed in supine prior to mobility, needed verbal/tactile cues for form/technique and to attend to task. The patient demonstrated supine to sit with modA, significant improvement noted in patient participation this session. Several minutes spent in sitting with assist varying from modA-CGA. Lateral scoot transfer to recliner (to patients R) performed with maxAx2. Significant time allowed for patient thought processing to maximize participation, pt able to push with feet and utilize UE when cued. After rest break, sit <> stand performed with RW maxAx2. Improved standing tolerance, LE weight bearing noted, some bilateral knee buckling noted. Overall the patient demonstrated progression towards goals and would benefit from further skilled PT intervention to continue to progress towards PLOF as able. Pt remains excellent candidate for CIR.     Follow Up Recommendations  CIR     Equipment Recommendations  Other (comment)(TBD)    Recommendations for Other Services       Precautions / Restrictions Precautions Precautions: Fall Precaution Comments: spinal cord stimulator Restrictions Weight Bearing  Restrictions: No    Mobility  Bed Mobility Overal bed mobility: Needs Assistance Bed Mobility: Supine to Sit     Supine to sit: Mod assist;HOB elevated;+2 for safety/equipment     General bed mobility comments: +1 assist to sit EOB with PT, OT there for safety/IV mgt  Transfers Overall transfer level: Needs assistance Equipment used: Rolling walker (2 wheeled) Transfers: Lateral/Scoot Transfers;Sit to/from Stand Sit to Stand: Max assist;+2 physical assistance        Lateral/Scoot Transfers: Mod assist;Max assist;+2 physical assistance General transfer comment: verbal cues for hand placement, sequencing of activities; mod-maxAx2 to complete transfer. sit to stand with RW and verbal cues for hand/foot placement, mod-maxx2, improved weight bearing through LE noted this session.  Ambulation/Gait             General Gait Details: deferred   Stairs             Wheelchair Mobility    Modified Rankin (Stroke Patients Only)       Balance Overall balance assessment: Needs assistance Sitting-balance support: Feet supported;Feet unsupported;Single extremity supported;Bilateral upper extremity supported Sitting balance-Leahy Scale: Fair Sitting balance - Comments: initial min-mod assist for sitting balance, improving to intermittent supervision to CGA to Min A to correct sitting balance with verbal cues for leaning anteriorly to prevent LOB posteriorly. donned R sock with OT assist Postural control: Posterior lean   Standing balance-Leahy Scale: Poor Standing balance comment: very poor balance, +2 assist to briefly maintain standing with legs buckling slightly                            Cognition Arousal/Alertness: Awake/alert Behavior During Therapy: WFL for tasks assessed/performed Overall Cognitive Status: Impaired/Different from baseline Area of Impairment: Orientation;Attention  Orientation Level: Disoriented  to;Situation Current Attention Level: Focused   Following Commands: Follows one step commands consistently;Follows one step commands with increased time;Follows multi-step commands with increased time(requires decreased distractions/ external stimuli)     Problem Solving: Slow processing;Requires verbal cues;Requires tactile cues        Exercises Other Exercises Other Exercises: Pt performed heel slides with tactile cues, ankle pumps with verbal cues, and hip abduction with tactile cues x10 ea side Other Exercises: Pt sat EOB for several minutes, demonstrated modA-CGA for sitting balance, balance improved with cues for set up and hand placement as well as anterior lean to correct posterior LOB    General Comments        Pertinent Vitals/Pain Pain Assessment: No/denies pain    Home Living                      Prior Function            PT Goals (current goals can now be found in the care plan section) Acute Rehab PT Goals Patient Stated Goal: Return to PLOF Progress towards PT goals: Progressing toward goals    Frequency    Min 2X/week      PT Plan Current plan remains appropriate    Co-evaluation PT/OT/SLP Co-Evaluation/Treatment: Yes Reason for Co-Treatment: Complexity of the patient's impairments (multi-system involvement);Necessary to address cognition/behavior during functional activity;For patient/therapist safety;To address functional/ADL transfers PT goals addressed during session: Mobility/safety with mobility;Proper use of DME;Strengthening/ROM OT goals addressed during session: ADL's and self-care;Proper use of Adaptive equipment and DME      AM-PAC PT "6 Clicks" Mobility   Outcome Measure  Help needed turning from your back to your side while in a flat bed without using bedrails?: A Lot Help needed moving from lying on your back to sitting on the side of a flat bed without using bedrails?: A Lot Help needed moving to and from a bed to a chair  (including a wheelchair)?: A Lot Help needed standing up from a chair using your arms (e.g., wheelchair or bedside chair)?: A Lot Help needed to walk in hospital room?: Total Help needed climbing 3-5 steps with a railing? : Total 6 Click Score: 10    End of Session Equipment Utilized During Treatment: Gait belt Activity Tolerance: Patient tolerated treatment well Patient left: in chair;with chair alarm set;with family/visitor present;with call bell/phone within reach(OT at bedside) Nurse Communication: Mobility status;Need for lift equipment PT Visit Diagnosis: Other abnormalities of gait and mobility (R26.89);Difficulty in walking, not elsewhere classified (R26.2);Muscle weakness (generalized) (M62.81);Other symptoms and signs involving the nervous system (R29.898)     Time: [id number] PT Time Calculation (min) (ACUTE ONLY): 45 min  Charges:  $Therapeutic Exercise: 8-22 mins $Therapeutic Activity: 8-22 mins                     Lieutenant Diego PT, DPT 3:42 PM,03/04/19

## 2019-03-04 NOTE — Progress Notes (Signed)
Inpatient Rehabilitation Admissions Coordinator  I will begin authorization with [employer] for a possible inpt rehab admit penidng their approval.  Danne Baxter, RN, MSN Rehab Admissions Coordinator [phone] 03/04/2019 3:42 PM

## 2019-03-04 NOTE — Progress Notes (Signed)
Subjective: Patient awake and alert.  Follows commands.  Agreeable to try to eat today.  Afebrile overnight.  Objective: Current vital signs: BP (!) 151/73 (BP Location: Left Arm)   Pulse 95   Temp 98.4 F (36.9 C) (Oral)   Resp 18   Wt 74.6 kg   SpO2 98%   BMI 31.08 kg/m  Vital signs in last 24 hours: Temp:  [97.6 F (36.4 C)-99 F (37.2 C)] 98.4 F (36.9 C) (02/04 0807) Pulse Rate:  [75-95] 95 (02/04 0807) Resp:  [16-18] 18 (02/04 0807) BP: (124-151)/(55-97) 151/73 (02/04 0807) SpO2:  [90 %-98 %] 98 % (02/04 0807)  Intake/Output from previous day: 02/03 0701 - 02/04 0700 In: 1799.9 [I.V.:1799.9] Out: 600 [Urine:600] Intake/Output this shift: No intake/output data recorded. Nutritional status:  Diet Order            DIET - DYS 1 Room service appropriate? Yes with Assist; Fluid consistency: Thin  Diet effective now             HEENT: Continued pain to palpation of the left neck although improved from yesterday.  Patient holding head more midline. Neurologic Exam: Mental Status: Alert, oriented, thought content appropriate.  Speech fluent with some halting of speech noted at times.  Able to follow commands without difficulty. Cranial Nerves: II: Visual fields grossly normal, pupils equal, round, reactive to light and accommodation III,IV, VI: ptosis not present, extra-ocular motions intact bilaterally V,VII: smile symmetric, facial light touch sensation normal bilaterally VIII: hearing normal bilaterally IX,X: gag reflex present XI: bilateral shoulder shrug XII: midline tongue extension Motor: Generalized weakness in all extremities.  Reported left wrist pain and decreased left hand grip as compared to right Sensory: Pinprick and light touch intact throughout, bilaterally  Lab Results: Basic Metabolic Panel: Recent Labs  Lab 03/01/19 1408 03/01/19 1656 03/02/19 0559 03/02/19 0909 03/02/19 1457 03/04/19 0648  NA 149*  --  148*  --   --  141  K 3.2*  --   2.8*  --  3.3* 3.5  CL 114*  --  113*  --   --  109  CO2 26  --  24  --   --  23  GLUCOSE 147*  --  144*  --   --  120*  BUN 16  --  16  --   --  9  CREATININE 0.59 0.35* 0.41*  --   --  0.34*  CALCIUM 8.8*  --  8.4*  --   --  8.4*  MG  --   --   --  1.6*  --  1.3*  PHOS  --   --   --   --   --  2.9    Liver Function Tests: Recent Labs  Lab 03/01/19 1408  AST 15  ALT 28  ALKPHOS 54  BILITOT 0.7  PROT 6.9  ALBUMIN 2.5*   No results for input(s): LIPASE, AMYLASE in the last 168 hours. Recent Labs  Lab 03/02/19 0909  AMMONIA 17    CBC: Recent Labs  Lab 03/01/19 1408 03/01/19 1656 03/02/19 0559 03/03/19 0708  WBC 20.2* 19.2* 16.2* 13.6*  NEUTROABS 17.3*  --   --  10.9*  HGB 10.5* 10.4* 9.7* 9.0*  HCT 34.3* 33.3* 31.4* 29.1*  MCV 92.2 91.7 91.5 91.2  PLT 357 312 297 256    Cardiac Enzymes: No results for input(s): CKTOTAL, CKMB, CKMBINDEX, TROPONINI in the last 168 hours.  Lipid Panel: No results for input(s): CHOL, TRIG,  HDL, CHOLHDL, VLDL, LDLCALC in the last 168 hours.  CBG: Recent Labs  Lab 03/01/19 1413  GLUCAP 122*    Microbiology: Results for orders placed or performed during the hospital encounter of 03/01/19  Blood culture (routine x 2)     Status: None (Preliminary result)   Collection Time: 03/01/19  3:41 PM   Specimen: BLOOD  Result Value Ref Range Status   Specimen Description BLOOD LEFT ANTECUBITAL  Final   Special Requests   Final    BOTTLES DRAWN AEROBIC AND ANAEROBIC Blood Culture adequate volume   Culture   Final    NO GROWTH 3 DAYS Performed at [facility], [address] [place], [place] [address]    Report Status PENDING  Incomplete  Blood culture (routine x 2)     Status: None (Preliminary result)   Collection Time: 03/01/19  3:41 PM   Specimen: BLOOD  Result Value Ref Range Status   Specimen Description BLOOD BLOOD RIGHT FOREARM  Final   Special Requests   Final    BOTTLES DRAWN AEROBIC AND ANAEROBIC Blood  Culture results may not be optimal due to an inadequate volume of blood received in culture bottles   Culture   Final    NO GROWTH 3 DAYS Performed at [facility], [address], [place], [place] [address]    Report Status PENDING  Incomplete  Urine Culture     Status: Abnormal   Collection Time: 03/02/19 10:12 AM   Specimen: Urine, Clean Catch  Result Value Ref Range Status   Specimen Description   Final    URINE, CLEAN CATCH Performed at [facility], [address], [place], [place] [address]    Special Requests   Final    NONE Performed at [facility], [address] [place], [place] [id number]    Culture (A)  Final    <10,000 COLONIES/mL INSIGNIFICANT GROWTH Performed at [facility], [place] [address] [place],  [address]    Report Status 03/03/2019 FINAL  Final    Coagulation Studies: No results for input(s): LABPROT, INR in the last 72 hours.  Imaging: CT ANGIO HEAD W OR WO CONTRAST  Result Date: 03/03/2019 CLINICAL DATA:  Acute neuro deficit. Rule out stroke. Recent COVID-19 EXAM: CT ANGIOGRAPHY HEAD TECHNIQUE: Multidetector CT imaging of the head was performed using the standard protocol during bolus administration of intravenous contrast. Multiplanar CT image reconstructions and MIPs were obtained to evaluate the vascular anatomy. CONTRAST:  68mL OMNIPAQUE IOHEXOL 350 MG/ML SOLN COMPARISON:  CT head 03/01/2019 FINDINGS: CT HEAD Brain: Mild atrophy. Negative for hydrocephalus. Negative for acute infarct, hemorrhage, mass. No fluid collection or midline shift. Vascular: Negative for hyperdense vessel Skull: Negative Sinuses: Mild mucosal edema sphenoid sinus. Remaining sinuses clear. Right mastoid effusion. Orbits: Negative CTA HEAD Anterior circulation: Atherosclerotic calcification in the cavernous carotid bilaterally without significant stenosis. Anterior and middle cerebral arteries widely patent without stenosis or aneurysm.  Posterior circulation: Both vertebral arteries patent to the basilar. PICA patent bilaterally. Basilar widely patent. AICA, superior cerebellar, posterior cerebral arteries patent bilaterally without stenosis or aneurysm. Venous sinuses: Normal venous enhancement Anatomic variants: None IMPRESSION: No acute intracranial abnormality Negative CTA head Electronically Signed   By: Franchot Gallo M.D.   On: 03/03/2019 12:45   CT CERVICAL SPINE W CONTRAST  Result Date: 03/03/2019 CLINICAL DATA:  Neck pain acute.  Rule out infection. EXAM: CT CERVICAL SPINE WITH CONTRAST TECHNIQUE: Multidetector CT imaging of the cervical spine  was performed during intravenous contrast administration. Multiplanar CT image reconstructions were also generated. CONTRAST:  77mL OMNIPAQUE IOHEXOL 350 MG/ML SOLN COMPARISON:  MRI cervical spine 07/21/2012 FINDINGS: Alignment: Mild anterolisthesis C4-5 and C5-6. Straightening of the cervical lordosis Skull base and vertebrae: Negative for fracture or mass. No evidence of bone infection in the cervical spine Soft tissues and spinal canal: Right lobe of the thyroid is diffusely enlarged with heterogeneous enhancement and multiple coarse calcifications. It is difficult to determine if this is a solitary nodule or multiple adjacent nodules. Overall the right lobe of the thyroid measures 32 x 18 x 52 mm. Small left lobe without nodularity. No adenopathy in neck. No soft tissue mass or edema in the neck. Disc levels: C2-3: Mild disc degeneration and spurring on the left. Mild left foraminal narrowing due to uncinate spurring C3-4: Bilateral facet degeneration.  Mild left foraminal narrowing C4-5: Mild anterolisthesis. Asymmetric facet degeneration on the left with mild left foraminal narrowing due to spurring C5-6: Mild anterolisthesis with asymmetric facet degeneration on the left. Mild left foraminal narrowing. C6-7: Disc degeneration and spondylosis with diffuse uncinate spurring. Mild foraminal  narrowing bilaterally and mild spinal stenosis C7-T1: Asymmetric facet degeneration on the right. Mild right foraminal narrowing due to spurring. Upper chest: Lung apices clear bilaterally. Other: None IMPRESSION: No acute abnormality in the cervical spine. No fracture or spinal infection Cervical spondylosis as above. Enlargement of the right lobe of the thyroid with heterogeneous enhancement multiple coarse calcifications. Possible large nodule versus goiter in the right lobe of the thyroid. Thyroid ultrasound recommended.(Ref: J Am Coll Radiol. 2015 Feb;12(2): 143-50). Electronically Signed   By: Franchot Gallo M.D.   On: 03/03/2019 12:51   US Carotid Bilateral  Result Date: 03/03/2019 CLINICAL DATA:  Weakness. Syncopal episode. History of hypertension, hyperlipidemia and smoking. EXAM: BILATERAL CAROTID DUPLEX ULTRASOUND TECHNIQUE: Pearline Cables scale imaging, color Doppler and duplex ultrasound were performed of bilateral carotid and vertebral arteries in the neck. COMPARISON:  None. FINDINGS: Criteria: Quantification of carotid stenosis is based on velocity parameters that correlate the residual internal carotid diameter with NASCET-based stenosis levels, using the diameter of the distal internal carotid lumen as the denominator for stenosis measurement. The following velocity measurements were obtained: RIGHT ICA: 119/30 cm/sec CCA: [id number] cm/sec SYSTOLIC ICA/CCA RATIO:  1.3 ECA: 94 cm/sec LEFT ICA: 95/23 cm/sec CCA: [id number] cm/sec SYSTOLIC ICA/CCA RATIO:  1.1 ECA: 81 cm/sec RIGHT CAROTID ARTERY: There is a minimal amount of eccentric echogenic plaque within the right carotid bulb (image 16), extending to involve the origin and proximal aspects of the right internal carotid artery (image 22), not resulting in elevated peak systolic velocities within the interrogated course of the right internal carotid artery to suggest a hemodynamically significant stenosis. RIGHT VERTEBRAL ARTERY:  Antegrade flow LEFT CAROTID  ARTERY: There is a minimal to moderate amount of eccentric echogenic plaque within the left carotid bulb (image 36 and 47), extending to involve the origin and proximal aspects of the left internal carotid artery (image 53), not resulting in elevated peak systolic velocities within the interrogated course of the left internal carotid artery to suggest a hemodynamically significant stenosis. LEFT VERTEBRAL ARTERY:  Antegrade flow IMPRESSION: Minimal to moderate amount of bilateral atherosclerotic plaque, left greater than right, not resulting in a hemodynamically significant stenosis within either internal carotid artery. Electronically Signed   By: Sandi Mariscal M.D.   On: 03/03/2019 09:07   DG Chest Port 1 View  Result Date: 03/03/2019 CLINICAL DATA:  Fever.  Confusion. EXAM: PORTABLE CHEST 1 VIEW COMPARISON:  03/01/2019; 02/14/2019; 06/30/2015 FINDINGS: Grossly unchanged cardiac silhouette and mediastinal contours with atherosclerotic plaque within the thoracic aorta. There is persistent rightward deviation of the tracheal air column at the level of the thoracic aorta. There is persistent thickening the right paratracheal stripe presumably secondary prominent vasculature. Continued improved aeration of the lungs with persistent ill-defined heterogeneous opacities within the bilateral mid and lower lungs, left greater than right. No new focal airspace opacities. No pleural effusion or pneumothorax. Spinal stimulator leads overlie the mid/caudal aspect of the thoracic spine. Post cholecystectomy. IMPRESSION: 1. Continued improved aeration of the lungs with persistent bilateral mid lung interstitial opacities, nonspecific though given relatively rapid continued improvement, is favored to represent improving edema. 2. No new focal airspace opacities. Electronically Signed   By: Sandi Mariscal M.D.   On: 03/03/2019 09:09   DG Abd Portable 2V  Result Date: 03/04/2019 CLINICAL DATA:  Abdominal pain EXAM: PORTABLE  ABDOMEN - 2 VIEW COMPARISON:  None. FINDINGS: Bowel gas pattern is unremarkable. No significant stool burden. Right upper quadrant surgical clips. Spinal stimulator is present. Lower lumbar degenerative changes are noted. IMPRESSION: Normal bowel gas pattern. Electronically Signed   By: Macy Mis M.D.   On: 03/04/2019 09:27   ECHOCARDIOGRAM COMPLETE  Result Date: 03/03/2019   ECHOCARDIOGRAM REPORT   Patient Name:   JONIE BAUERS Date of Exam: 03/03/2019 Medical Rec #:  [id number]           Height:       61.0 in Accession #:    [id number]          Weight:       164.5 lb Date of Birth:  1945/04/18          BSA:          1.74 m Patient Age:    30 years            BP:           124/61 mmHg Patient Gender: F                   HR:           77 bpm. Exam Location:  [facility] Procedure: 2D Echo, Color Doppler and Cardiac Doppler Indications:     I163.9 Stroke  History:         Patient has no prior history of Echocardiogram examinations.                  Risk Factors:Hypertension and Dyslipidemia.  Sonographer:     Charmayne Sheer RDCS (AE) Referring Phys:  [id number] TAWFIKUL ALAM Diagnosing Phys: Yolonda Kida MD  Sonographer Comments: No subcostal window and suboptimal apical window. IMPRESSIONS  1. Left ventricular ejection fraction, by visual estimation, is 50 to 55%. The left ventricle has normal function. Normal left ventricular posterior wall thickness. There is borderline left ventricular hypertrophy.  2. Left ventricular diastolic parameters are consistent with Grade I diastolic dysfunction (impaired relaxation).  3. The left ventricle demonstrates regional wall motion abnormalities.  4. Global right ventricle has normal systolic function.The right ventricular size is normal. No increase in right ventricular wall thickness.  5. Left atrial size was normal.  6. Right atrial size was normal.  7. The mitral valve is normal in structure. Trivial mitral valve regurgitation.  8. The tricuspid valve is normal in  structure.  9. The tricuspid valve is normal in structure. Tricuspid valve regurgitation is trivial.  10. The aortic valve is normal in structure. Aortic valve regurgitation is not visualized. 11. The pulmonic valve was grossly normal. Pulmonic valve regurgitation is not visualized. FINDINGS  Left Ventricle: Left ventricular ejection fraction, by visual estimation, is 50 to 55%. The left ventricle has normal function. The left ventricle demonstrates regional wall motion abnormalities. The left ventricular internal cavity size was the left ventricle is normal in size. Normal left ventricular posterior wall thickness. There is borderline left ventricular hypertrophy. Concentric left ventricular hypertrophy. Left ventricular diastolic parameters are consistent with Grade I diastolic dysfunction (impaired relaxation). Right Ventricle: The right ventricular size is normal. No increase in right ventricular wall thickness. Global RV systolic function is has normal systolic function. Left Atrium: Left atrial size was normal in size. Right Atrium: Right atrial size was normal in size Pericardium: There is no evidence of pericardial effusion. Mitral Valve: The mitral valve is normal in structure. Trivial mitral valve regurgitation. MV peak gradient, 3.4 mmHg. Tricuspid Valve: The tricuspid valve is normal in structure. Tricuspid valve regurgitation is trivial. Aortic Valve: The aortic valve is normal in structure. Aortic valve regurgitation is not visualized. Aortic valve mean gradient measures 5.0 mmHg. Aortic valve peak gradient measures 9.2 mmHg. Aortic valve area, by VTI measures 1.71 cm. Pulmonic Valve: The pulmonic valve was grossly normal. Pulmonic valve regurgitation is not visualized. Pulmonic regurgitation is not visualized. Aorta: The aortic root is normal in size and structure. IAS/Shunts: No atrial level shunt detected by color flow Doppler.  LEFT VENTRICLE PLAX 2D LVIDd:         3.66 cm  Diastology LVIDs:          2.67 cm  LV e' lateral:   10.20 cm/s LV PW:         0.82 cm  LV E/e' lateral: 5.7 LV IVS:        0.79 cm  LV e' medial:    9.90 cm/s LVOT diam:     1.90 cm  LV E/e' medial:  5.8 LV SV:         30 ml LV SV Index:   16.67 LVOT Area:     2.84 cm  LEFT ATRIUM         Index LA diam:    3.50 cm 2.01 cm/m  AORTIC VALVE                    PULMONIC VALVE AV Area (Vmax):    1.64 cm     PV Vmax:       0.99 m/s AV Area (Vmean):   1.48 cm     PV Vmean:      64.500 cm/s AV Area (VTI):     1.71 cm     PV VTI:        0.168 m AV Vmax:           152.00 cm/s  PV Peak grad:  3.9 mmHg AV Vmean:          106.000 cm/s PV Mean grad:  2.0 mmHg AV VTI:            0.216 m AV Peak Grad:      9.2 mmHg AV Mean Grad:      5.0 mmHg LVOT Vmax:         87.90 cm/s LVOT Vmean:        55.200 cm/s LVOT VTI:          0.130 m LVOT/AV VTI ratio: 0.60  AORTA  Ao Root diam: 2.90 cm MITRAL VALVE MV Area (PHT): 3.48 cm             SHUNTS MV Peak grad:  3.4 mmHg             Systemic VTI:  0.13 m MV Mean grad:  1.0 mmHg             Systemic Diam: 1.90 cm MV Vmax:       0.92 m/s MV Vmean:      51.0 cm/s MV VTI:        0.21 m MV PHT:        63.22 msec MV Decel Time: 218 msec MV E velocity: 57.70 cm/s 103 cm/s MV A velocity: 83.90 cm/s 70.3 cm/s MV E/A ratio:  0.69       1.5  Dwayne D Callwood MD Electronically signed by Yolonda Kida MD Signature Date/Time: 03/03/2019/8:54:18 PM    Final     Medications:  I have reviewed the patient's current medications. Scheduled: . aspirin  81 mg Oral Daily  . atenolol  50 mg Oral Daily  . cholecalciferol  5,000 Units Oral Daily  . citalopram  20 mg Oral Daily  . enoxaparin (LOVENOX) injection  40 mg Subcutaneous Q24H  . fenofibrate  160 mg Oral Daily  . fluticasone furoate-vilanterol  1 puff Inhalation Daily  . Melatonin  5 mg Oral QHS  . montelukast  10 mg Oral QHS  . QUEtiapine  12.5 mg Oral QHS  . rOPINIRole  1 mg Oral BID  . vitamin B-12  1,000 mcg Oral Daily  . zinc sulfate  220 mg Oral Daily     Assessment/Plan: 74 year old COVID patient with altered mental status and left upper extremity weakness.  Patient exhibits quite a bit of pain when her left arm is moved and when her neck is moved to the left specifically.  Husband reports that she had two lines in that location.  Focal weakness may very well be related to this pain.  Improved today.  Patient otherwise is weak throughout and having some improvement noted in her mental status per husband.  Mental status continues to improve and today patient is agreeable to attempt to eat.  Patient afebrile. Echocardiogram unremarkable with EF of 50-55%.  Recommendations: 1. Will continue to follow patient clinically.  LP not indicated at this time. 2. Continue ASA  3. PT/OT and speech therapy   LOS: 3 days   Alexis Goodell, MD Neurology [phone] 03/04/2019  9:37 AM

## 2019-03-04 NOTE — Progress Notes (Signed)
Occupational Therapy Treatment Patient Details Name: [name] MRN: [id number] DOB: 1945-06-08 Today's Date: 03/04/2019    History of present illness Pt. is a 74 y.o. female with recent discharge, on 02/26/2019 from [facility] for COVID-19 infection hospitalization and altered level of consciousness comes today with altered level of consciousness.  Patient also has significant past medical history of asthma, spinal stimulator, HTN, anxiety, depression, migraines, HLD, restless leg and chronic pain managed with narcotics and other pain medication.  She was discharged to the skilled nursing facility from [facility] (hospitalized 02/14/19-02/26/19).   OT comments  Pt seen for OT tx this date, partial co-tx with PT for mobility training. Pt with PT upon start of session. Rehab MD in room at start of session discussing CIR with pt/spouse. Pt eager to participate, spouse supportive throughout session. +1 physical assist from PT for bed mobility. Once EOB, pt required varying levels of physical assist (sup to Min A) to correct for posterior lean/slight LOB, improving with time and cues. With Min A for dynamic sitting balance and Min A to support RLE, pt able to reach for and grasp sock on L side and using LUE donned sock on R foot. Pt reporting mild dizziness requiring additional time (did not worsen or improve with positional changes, BP checked). Lateral scoot transfers with +2 Max assist to recliner. Room rearranged and pt then trialed STS transfer with Mod-Max A x2 and cues for hand/foot placement with RW. B knees noted to buckle slightly. Once in recliner, meal tray placed in front of her. Pt opened lids/packets (PRN Min A to open) and able to access 100% of meal tray, visually attending to both the R and L side. Pt instructed to reach for and retrieve various items, requiring visual scanning. No difficulty noted. Pt's cognition and speech appears much improved as well. Pt more appropriate and  able to better articulate most thoughts, still has occasional trouble with descriptions. Follows commands with cues. Pt demonstrating significant improvement in attention to L side, utilizing LUE and BUE in automatic and on command tasks well. Appeared to get a bit overwhelmed with various stimuli in room requiring her to multitask. Improved performance noted with minimal environmental distractions and proving her with 1 task at a time or "first/then" sequencing. Pt progressing well towards goals. Continues to benefit from high intensity skilled OT services. Continue to recommend CIR at discharge. Pt very motivated to improve, spouse very supportive, and both agreeable to CIR. Will continue to progress.   Follow Up Recommendations  CIR    Equipment Recommendations  3 in 1 bedside commode    Recommendations for Other Services Rehab consult    Precautions / Restrictions Precautions Precautions: Fall Precaution Comments: spinal cord stimulator Restrictions Weight Bearing Restrictions: No       Mobility Bed Mobility Overal bed mobility: Needs Assistance Bed Mobility: Supine to Sit           General bed mobility comments: +1 assist to sit EOB with PT, OT there for safety/IV mgt  Transfers Overall transfer level: Needs assistance Equipment used: Rolling walker (2 wheeled) Transfers: Lateral/Scoot Transfers;Sit to/from Stand Sit to Stand: Max assist;+2 physical assistance        Lateral/Scoot Transfers: Max assist;+2 physical assistance;Mod assist General transfer comment: verbal cues for hand placement and sequencing to perform lateral scoots to recliner with +2 assist; STS with RW and VC for hand/foot placement requiring mod-max assist x2    Balance Overall balance assessment: Needs assistance Sitting-balance support:  Feet supported;Feet unsupported;Single extremity supported;Bilateral upper extremity supported Sitting balance-Leahy Scale: Fair Sitting balance - Comments:  initial min-mod assist for sitting balance, improving to intermittent supervision to CGA to Min A to correct sitting balance with verbal cues for leaning anteriorly to prevent LOB posteriorly; Min A for dynamic sitting balance while donning R sock Postural control: Posterior lean   Standing balance-Leahy Scale: Poor Standing balance comment: very poor balance, +2 assist to briefly maintain standing with legs buckling slightly                           ADL either performed or assessed with clinical judgement   ADL Overall ADL's : Needs assistance/impaired Eating/Feeding: Sitting Eating/Feeding Details (indicate cue type and reason): Sitting in recliner with tray positioned in front of her pt removed lids, opens containers/packets with PRN Min Assist. Utilizes bilat UEs without cues Grooming: Wash/dry face;Sitting;Set up Grooming Details (indicate cue type and reason): With set up of cool washcloth for pt's face/neck, and holding pt's R hand, pt grasps cloth with L hand and brings to face and neck to wipe                                     Vision Baseline Vision/History: Wears glasses Wears Glasses: At all times Patient Visual Report: No change from baseline Additional Comments: Pt denies visual difficulties this date, tracks therapists to R and L, no difficulty noted   Perception     Praxis      Cognition Arousal/Alertness: Awake/alert Behavior During Therapy: WFL for tasks assessed/performed Overall Cognitive Status: Impaired/Different from baseline                   Orientation Level: Disoriented to;Situation Current Attention Level: Focused   Following Commands: Follows one step commands consistently;Follows one step commands with increased time;Follows multi-step commands with increased time(requires decreased distractions/external environmental stimuli)     Problem Solving: Slow processing;Requires verbal cues;Requires tactile cues           Exercises Other Exercises Other Exercises: Sitting EOB with Min assist to support trunk and RLE across LLE, pt able to reach for sock on her L side and place on R foot using L hand primarily, requiring additional time/effort and brief rest break   Shoulder Instructions       General Comments      Pertinent Vitals/ Pain       Pain Assessment: No/denies pain  Home Living                                          Prior Functioning/Environment              Frequency  Min 3X/week        Progress Toward Goals  OT Goals(current goals can now be found in the care plan section)  Progress towards OT goals: Progressing toward goals  Acute Rehab OT Goals Patient Stated Goal: Return to PLOF OT Goal Formulation: With patient/family Time For Goal Achievement: 03/16/19 Potential to Achieve Goals: Good  Plan Discharge plan remains appropriate;Frequency remains appropriate    Co-evaluation    PT/OT/SLP Co-Evaluation/Treatment: Yes Reason for Co-Treatment: Complexity of the patient's impairments (multi-system involvement);Necessary to address cognition/behavior during functional activity;For patient/therapist safety;To address functional/ADL transfers PT  goals addressed during session: Mobility/safety with mobility;Strengthening/ROM;Proper use of DME;Balance OT goals addressed during session: ADL's and self-care;Proper use of Adaptive equipment and DME      AM-PAC OT "6 Clicks" Daily Activity     Outcome Measure   Help from another person eating meals?: A Little Help from another person taking care of personal grooming?: A Little Help from another person toileting, which includes using toliet, bedpan, or urinal?: A Lot Help from another person bathing (including washing, rinsing, drying)?: A Lot Help from another person to put on and taking off regular upper body clothing?: A Lot Help from another person to put on and taking off regular lower body  clothing?: A Lot 6 Click Score: 14    End of Session Equipment Utilized During Treatment: Gait belt;Rolling walker  OT Visit Diagnosis: Other abnormalities of gait and mobility (R26.89);Low vision, both eyes (H54.2);Muscle weakness (generalized) (M62.81);Other symptoms and signs involving cognitive function;Hemiplegia and hemiparesis Hemiplegia - Right/Left: Left Hemiplegia - dominant/non-dominant: Dominant Hemiplegia - caused by: Unspecified   Activity Tolerance Patient tolerated treatment well   Patient Left in chair;with call bell/phone within reach;with chair alarm set;Other (comment)(SLP in room for further assessment)   Nurse Communication          Time: [phone] OT Time Calculation (min): 46 min  Charges: OT General Charges $OT Visit: 1 Visit OT Treatments $Self Care/Home Management : 8-22 mins $Therapeutic Activity: 8-22 mins  Jeni Salles, MPH, MS, OTR/L ascom [phone] 03/04/19, 3:09 PM

## 2019-03-04 NOTE — Progress Notes (Signed)
Inpatient Rehabilitation Admissions Coordinator  Inpatient rehab consult received. I spoke with pt's spouse by phone who is at her bedside. We discussed goals and expectations of an inpt rehab admit. He prefers inpt rehab/CIR rather than return to SNF. She was at [facility] for about 3 days before readmit. He works but is taking off to provide his wife assistance until she recovers. I await further progress with therapy before pursuing insurance approval with [facility]. Hopeful that she can transfer to chair today. Dr. Ranell Patrick to see patient today for bedside assessment. I will follow up tomorrow.  Danne Baxter, RN, MSN Rehab Admissions Coordinator [phone] 03/04/2019 11:51 AM
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
## 2019-03-05 ENCOUNTER — Inpatient Hospital Stay (HOSPITAL_COMMUNITY)
Admission: RE | Admit: 2019-03-05 | Discharge: 2019-03-17 | DRG: 071 | Disposition: A | Discharge status: Home Health | Payer: Medicare HMO | Source: Intra-hospital | Attending: Physical Medicine & Rehabilitation | Admitting: Physical Medicine & Rehabilitation

## 2019-03-05 ENCOUNTER — Other Ambulatory Visit: Payer: Self-pay

## 2019-03-05 ENCOUNTER — Encounter (HOSPITAL_COMMUNITY): Payer: Self-pay | Admitting: Physical Medicine & Rehabilitation

## 2019-03-05 DIAGNOSIS — Z9049 Acquired absence of other specified parts of digestive tract: Secondary | ICD-10-CM

## 2019-03-05 DIAGNOSIS — R1310 Dysphagia, unspecified: Secondary | ICD-10-CM | POA: Diagnosis present

## 2019-03-05 DIAGNOSIS — G2581 Restless legs syndrome: Secondary | ICD-10-CM | POA: Diagnosis present

## 2019-03-05 DIAGNOSIS — Z8616 Personal history of COVID-19: Secondary | ICD-10-CM

## 2019-03-05 DIAGNOSIS — E8809 Other disorders of plasma-protein metabolism, not elsewhere classified: Secondary | ICD-10-CM

## 2019-03-05 DIAGNOSIS — K219 Gastro-esophageal reflux disease without esophagitis: Secondary | ICD-10-CM | POA: Diagnosis present

## 2019-03-05 DIAGNOSIS — G4700 Insomnia, unspecified: Secondary | ICD-10-CM | POA: Diagnosis present

## 2019-03-05 DIAGNOSIS — Z969 Presence of functional implant, unspecified: Secondary | ICD-10-CM | POA: Diagnosis not present

## 2019-03-05 DIAGNOSIS — F419 Anxiety disorder, unspecified: Secondary | ICD-10-CM | POA: Diagnosis present

## 2019-03-05 DIAGNOSIS — Z201 Contact with and (suspected) exposure to tuberculosis: Secondary | ICD-10-CM | POA: Diagnosis present

## 2019-03-05 DIAGNOSIS — E785 Hyperlipidemia, unspecified: Secondary | ICD-10-CM | POA: Diagnosis present

## 2019-03-05 DIAGNOSIS — Z886 Allergy status to analgesic agent status: Secondary | ICD-10-CM

## 2019-03-05 DIAGNOSIS — G894 Chronic pain syndrome: Secondary | ICD-10-CM | POA: Diagnosis present

## 2019-03-05 DIAGNOSIS — M961 Postlaminectomy syndrome, not elsewhere classified: Secondary | ICD-10-CM | POA: Diagnosis present

## 2019-03-05 DIAGNOSIS — Z880 Allergy status to penicillin: Secondary | ICD-10-CM

## 2019-03-05 DIAGNOSIS — Z7982 Long term (current) use of aspirin: Secondary | ICD-10-CM

## 2019-03-05 DIAGNOSIS — J45909 Unspecified asthma, uncomplicated: Secondary | ICD-10-CM | POA: Diagnosis present

## 2019-03-05 DIAGNOSIS — Z9013 Acquired absence of bilateral breasts and nipples: Secondary | ICD-10-CM | POA: Diagnosis not present

## 2019-03-05 DIAGNOSIS — R110 Nausea: Secondary | ICD-10-CM | POA: Diagnosis not present

## 2019-03-05 DIAGNOSIS — B370 Candidal stomatitis: Secondary | ICD-10-CM | POA: Diagnosis present

## 2019-03-05 DIAGNOSIS — Z8249 Family history of ischemic heart disease and other diseases of the circulatory system: Secondary | ICD-10-CM

## 2019-03-05 DIAGNOSIS — Z803 Family history of malignant neoplasm of breast: Secondary | ICD-10-CM

## 2019-03-05 DIAGNOSIS — Z90722 Acquired absence of ovaries, bilateral: Secondary | ICD-10-CM

## 2019-03-05 DIAGNOSIS — R509 Fever, unspecified: Secondary | ICD-10-CM | POA: Diagnosis not present

## 2019-03-05 DIAGNOSIS — J309 Allergic rhinitis, unspecified: Secondary | ICD-10-CM | POA: Diagnosis present

## 2019-03-05 DIAGNOSIS — J449 Chronic obstructive pulmonary disease, unspecified: Secondary | ICD-10-CM | POA: Diagnosis present

## 2019-03-05 DIAGNOSIS — R414 Neurologic neglect syndrome: Secondary | ICD-10-CM | POA: Diagnosis present

## 2019-03-05 DIAGNOSIS — R159 Full incontinence of feces: Secondary | ICD-10-CM

## 2019-03-05 DIAGNOSIS — F329 Major depressive disorder, single episode, unspecified: Secondary | ICD-10-CM | POA: Diagnosis present

## 2019-03-05 DIAGNOSIS — G9341 Metabolic encephalopathy: Secondary | ICD-10-CM

## 2019-03-05 DIAGNOSIS — R1312 Dysphagia, oropharyngeal phase: Secondary | ICD-10-CM

## 2019-03-05 DIAGNOSIS — M109 Gout, unspecified: Secondary | ICD-10-CM | POA: Diagnosis present

## 2019-03-05 DIAGNOSIS — L89151 Pressure ulcer of sacral region, stage 1: Secondary | ICD-10-CM | POA: Diagnosis present

## 2019-03-05 DIAGNOSIS — Z90710 Acquired absence of both cervix and uterus: Secondary | ICD-10-CM

## 2019-03-05 DIAGNOSIS — Z79899 Other long term (current) drug therapy: Secondary | ICD-10-CM

## 2019-03-05 DIAGNOSIS — R0989 Other specified symptoms and signs involving the circulatory and respiratory systems: Secondary | ICD-10-CM

## 2019-03-05 DIAGNOSIS — D649 Anemia, unspecified: Secondary | ICD-10-CM

## 2019-03-05 DIAGNOSIS — E46 Unspecified protein-calorie malnutrition: Secondary | ICD-10-CM

## 2019-03-05 DIAGNOSIS — Z79891 Long term (current) use of opiate analgesic: Secondary | ICD-10-CM

## 2019-03-05 DIAGNOSIS — K5900 Constipation, unspecified: Secondary | ICD-10-CM | POA: Diagnosis present

## 2019-03-05 DIAGNOSIS — E876 Hypokalemia: Secondary | ICD-10-CM | POA: Diagnosis not present

## 2019-03-05 DIAGNOSIS — Z87891 Personal history of nicotine dependence: Secondary | ICD-10-CM

## 2019-03-05 DIAGNOSIS — R404 Transient alteration of awareness: Secondary | ICD-10-CM | POA: Diagnosis not present

## 2019-03-05 DIAGNOSIS — Z9682 Presence of neurostimulator: Secondary | ICD-10-CM

## 2019-03-05 DIAGNOSIS — Z888 Allergy status to other drugs, medicaments and biological substances status: Secondary | ICD-10-CM

## 2019-03-05 DIAGNOSIS — Z7951 Long term (current) use of inhaled steroids: Secondary | ICD-10-CM

## 2019-03-05 DIAGNOSIS — Z885 Allergy status to narcotic agent status: Secondary | ICD-10-CM

## 2019-03-05 DIAGNOSIS — R531 Weakness: Secondary | ICD-10-CM | POA: Diagnosis not present

## 2019-03-05 DIAGNOSIS — I10 Essential (primary) hypertension: Secondary | ICD-10-CM

## 2019-03-05 LAB — CREATININE, SERUM
Creatinine, Ser: 0.52 mg/dL (ref 0.44–1.00)
GFR calc Af Amer: >60 mL/min (ref >60)
GFR calc non Af Amer: >60 mL/min (ref >60)

## 2019-03-05 LAB — BASIC METABOLIC PANEL
Anion gap: 8 (ref 5–15)
BUN: 7 mg/dL — ABNORMAL LOW (ref 8–23)
CO2: 24 mmol/L (ref 22–32)
Calcium: 8.4 mg/dL — ABNORMAL LOW (ref 8.9–10.3)
Chloride: 110 mmol/L (ref 98–111)
Creatinine, Ser: 0.3 mg/dL — ABNORMAL LOW (ref 0.44–1.00)
GFR calc Af Amer: >60 mL/min (ref >60)
GFR calc non Af Amer: >60 mL/min (ref >60)
Glucose, Bld: 101 mg/dL — ABNORMAL HIGH (ref 70–99)
Potassium: 3.8 mmol/L (ref 3.5–5.1)
Sodium: 142 mmol/L (ref 135–145)

## 2019-03-05 LAB — CBC
HCT: 32.1 % — ABNORMAL LOW (ref 36.0–46.0)
Hemoglobin: 10 g/dL — ABNORMAL LOW (ref 12.0–15.0)
MCH: 28.2 pg (ref 26.0–34.0)
MCHC: 31.2 g/dL (ref 30.0–36.0)
MCV: 90.7 fL (ref 80.0–100.0)
Platelets: 297 10*3/uL (ref 150–400)
RBC: 3.54 MIL/uL — ABNORMAL LOW (ref 3.87–5.11)
RDW: 15.9 % — ABNORMAL HIGH (ref 11.5–15.5)
WBC: 9.6 10*3/uL (ref 4.0–10.5)
nRBC: 0 % (ref 0.0–0.2)

## 2019-03-05 LAB — MAGNESIUM: Magnesium: 1.8 mg/dL (ref 1.7–2.4)

## 2019-03-05 MED ORDER — ENOXAPARIN SODIUM 40 MG/0.4ML ~~LOC~~ SOLN
40.0000 mg | SUBCUTANEOUS | Status: DC
  Administered 2019-03-05 – 2019-03-16 (×12): 40 mg via SUBCUTANEOUS
  Filled 2019-03-05 (×12): qty 0.4

## 2019-03-05 MED ORDER — ROPINIROLE HCL 1 MG PO TABS
1.0000 mg | ORAL_TABLET | Freq: Two times a day (BID) | ORAL | Status: DC
  Administered 2019-03-05 – 2019-03-17 (×24): 1 mg via ORAL
  Filled 2019-03-05 (×24): qty 1

## 2019-03-05 MED ORDER — MELATONIN 3 MG PO TABS
5.0000 mg | ORAL_TABLET | Freq: Every day | ORAL | Status: DC
  Administered 2019-03-05 – 2019-03-12 (×8): 4.5 mg via ORAL
  Filled 2019-03-05 (×9): qty 1.5

## 2019-03-05 MED ORDER — QUETIAPINE FUMARATE 25 MG PO TABS
12.5000 mg | ORAL_TABLET | Freq: Every day | ORAL | Status: DC
  Administered 2019-03-05 – 2019-03-16 (×12): 12.5 mg via ORAL
  Filled 2019-03-05 (×12): qty 1

## 2019-03-05 MED ORDER — FLUTICASONE FUROATE-VILANTEROL 200-25 MCG/INH IN AEPB
1.0000 | INHALATION_SPRAY | Freq: Every day | RESPIRATORY_TRACT | Status: DC
  Administered 2019-03-09 – 2019-03-17 (×9): 1 via RESPIRATORY_TRACT
  Filled 2019-03-05: qty 28

## 2019-03-05 MED ORDER — ZINC SULFATE 220 (50 ZN) MG PO CAPS
220.0000 mg | ORAL_CAPSULE | Freq: Every day | ORAL | Status: DC
  Administered 2019-03-06 – 2019-03-17 (×12): 220 mg via ORAL
  Filled 2019-03-05 (×12): qty 1

## 2019-03-05 MED ORDER — VITAMIN B-12 1000 MCG PO TABS
1000.0000 ug | ORAL_TABLET | Freq: Every day | ORAL | Status: DC
  Administered 2019-03-06 – 2019-03-17 (×12): 1000 ug via ORAL
  Filled 2019-03-05 (×12): qty 1

## 2019-03-05 MED ORDER — MONTELUKAST SODIUM 10 MG PO TABS
10.0000 mg | ORAL_TABLET | Freq: Every day | ORAL | Status: DC
  Administered 2019-03-05 – 2019-03-16 (×13): 10 mg via ORAL
  Filled 2019-03-05 (×12): qty 1

## 2019-03-05 MED ORDER — QUETIAPINE FUMARATE 25 MG PO TABS: 12.5000 mg | ORAL_TABLET | Freq: Every day | ORAL | 0 refills | Status: DC

## 2019-03-05 MED ORDER — METHIMAZOLE 5 MG PO TABS
5.0000 mg | ORAL_TABLET | Freq: Every day | ORAL | Status: DC
  Administered 2019-03-06 – 2019-03-17 (×12): 5 mg via ORAL
  Filled 2019-03-05 (×12): qty 1

## 2019-03-05 MED ORDER — ASPIRIN 81 MG PO CHEW
81.0000 mg | CHEWABLE_TABLET | Freq: Every day | ORAL | Status: DC
  Administered 2019-03-06 – 2019-03-17 (×12): 81 mg via ORAL
  Filled 2019-03-05 (×12): qty 1

## 2019-03-05 MED ORDER — LOPERAMIDE HCL 2 MG PO CAPS: 2.0000 mg | ORAL_CAPSULE | Freq: Three times a day (TID) | ORAL | 0 refills | Status: DC | PRN

## 2019-03-05 MED ORDER — LOPERAMIDE HCL 2 MG PO CAPS: 2.0000 mg | ORAL_CAPSULE | Freq: Three times a day (TID) | ORAL | Status: DC | PRN

## 2019-03-05 MED ORDER — ROPINIROLE HCL 1 MG PO TABS: 1.0000 mg | ORAL_TABLET | Freq: Every day | ORAL | 0 refills | Status: DC

## 2019-03-05 MED ORDER — ALBUTEROL SULFATE (2.5 MG/3ML) 0.083% IN NEBU: 2.5000 mg | INHALATION_SOLUTION | RESPIRATORY_TRACT | Status: DC | PRN

## 2019-03-05 MED ORDER — ENOXAPARIN SODIUM 40 MG/0.4ML ~~LOC~~ SOLN: 40.0000 mg | SUBCUTANEOUS | Status: DC

## 2019-03-05 MED ORDER — FLUCONAZOLE 100 MG PO TABS: 100.0000 mg | ORAL_TABLET | Freq: Every day | ORAL | 0 refills | Status: DC

## 2019-03-05 MED ORDER — SORBITOL 70 % SOLN
30.0000 mL | Freq: Every day | Status: DC | PRN
  Administered 2019-03-07 – 2019-03-17 (×3): 30 mL via ORAL
  Filled 2019-03-05 (×3): qty 30

## 2019-03-05 MED ORDER — MAGNESIUM SULFATE 2 GM/50ML IV SOLN
2.0000 g | Freq: Once | INTRAVENOUS | Status: AC
  Administered 2019-03-05: 2 g via INTRAVENOUS
  Filled 2019-03-05: qty 50

## 2019-03-05 MED ORDER — FENOFIBRATE 160 MG PO TABS
160.0000 mg | ORAL_TABLET | Freq: Every day | ORAL | Status: DC
  Administered 2019-03-06 – 2019-03-17 (×12): 160 mg via ORAL
  Filled 2019-03-05 (×12): qty 1

## 2019-03-05 MED ORDER — VITAMIN D 25 MCG (1000 UNIT) PO TABS
5000.0000 [IU] | ORAL_TABLET | Freq: Every day | ORAL | Status: DC
  Administered 2019-03-06 – 2019-03-17 (×12): 5000 [IU] via ORAL
  Filled 2019-03-05 (×12): qty 5

## 2019-03-05 MED ORDER — METHIMAZOLE 5 MG PO TABS: 5.0000 mg | ORAL_TABLET | Freq: Every day | ORAL | 0 refills | Status: DC

## 2019-03-05 MED ORDER — ATENOLOL 50 MG PO TABS
50.0000 mg | ORAL_TABLET | Freq: Every day | ORAL | Status: DC
  Administered 2019-03-06 – 2019-03-17 (×12): 50 mg via ORAL
  Filled 2019-03-05 (×12): qty 1

## 2019-03-05 MED ORDER — CALCIUM CARBONATE ANTACID 500 MG PO CHEW
1.0000 | CHEWABLE_TABLET | Freq: Three times a day (TID) | ORAL | Status: DC | PRN
  Administered 2019-03-12: 200 mg via ORAL
  Filled 2019-03-05: qty 1

## 2019-03-05 MED ORDER — CITALOPRAM HYDROBROMIDE 20 MG PO TABS
20.0000 mg | ORAL_TABLET | Freq: Every day | ORAL | Status: DC
  Administered 2019-03-06 – 2019-03-17 (×12): 20 mg via ORAL
  Filled 2019-03-05 (×12): qty 1

## 2019-03-05 MED ORDER — FLUCONAZOLE 100MG IVPB
100.0000 mg | INTRAVENOUS | Status: DC
  Administered 2019-03-06 – 2019-03-07 (×2): 100 mg via INTRAVENOUS
  Filled 2019-03-05 (×3): qty 50

## 2019-03-05 MED ORDER — ACETAMINOPHEN 325 MG PO TABS
650.0000 mg | ORAL_TABLET | Freq: Four times a day (QID) | ORAL | Status: DC | PRN
  Administered 2019-03-05 – 2019-03-16 (×5): 650 mg via ORAL
  Filled 2019-03-05 (×5): qty 2

## 2019-03-05 NOTE — IPOC Note (Signed)
Individualized overall Plan of Care (IPOC) Patient Details Name: [name] MRN: [id number] DOB: 1945/05/14  Admitting Diagnosis: Acute metabolic encephalopathy  Hospital Problems: Principal Problem:   Acute metabolic encephalopathy Active Problems:   Long term current use of opiate analgesic   Chronic pain syndrome   Postlaminectomy syndrome, lumbar region   Presence of functional implant ([employer] lumbar spinal cord stimulator)   Bronchial asthma   COPD (chronic obstructive pulmonary disease) (HCC)   Thrush   Fecal incontinence   Anemia   Hypoalbuminemia due to protein-calorie malnutrition (HCC)   Dysphagia   Labile blood pressure     Functional Problem List: Nursing Behavior, Pain, Bladder, Safety, Bowel, Endurance, Motor, Skin Integrity, Nutrition, Medication Management  PT Balance, Endurance, Motor, Perception, Safety  OT Balance, Behavior, Cognition, Endurance, Motor, Pain, Perception, Skin Integrity  SLP Cognition, Nutrition  TR         Basic ADL's: OT Eating, Grooming, Bathing, Dressing, Toileting     Advanced  ADL's: OT Simple Meal Preparation     Transfers: PT Bed Mobility, Bed to Chair, Car, Furniture, Floor  OT Toilet, Metallurgist: PT Ambulation, Emergency planning/management officer, Stairs     Additional Impairments: OT None  SLP Swallowing, Social Cognition   Problem Solving, Memory, Attention, Awareness  TR      Anticipated Outcomes Item Anticipated Outcome  Self Feeding No goal  Swallowing  mod I   Basic self-care  Min A  Toileting  Min A   Bathroom Transfers Min A  Bowel/Bladder  pt will be able to manage bowel and bladder with mod I assist  Transfers  min A  Locomotion  min A at w/c level  Communication     Cognition  Supervision for basic  Pain  pain level less than 4 on scale of 0-10  Safety/Judgment  pt will remain free of injury, prevent falls with cues and reminders   Therapy Plan: PT Intensity: Minimum of  1-2 x/day ,45 to 90 minutes PT Frequency: 5 out of 7 days PT Duration Estimated Length of Stay: 14-16 days OT Intensity: Minimum of 1-2 x/day, 45 to 90 minutes OT Frequency: 5 out of 7 days OT Duration/Estimated Length of Stay: 14-16 days SLP Intensity: Minumum of 1-2 x/day, 30 to 90 minutes SLP Frequency: 3 to 5 out of 7 days SLP Duration/Estimated Length of Stay: 14-21 days    Team Interventions: Nursing Interventions Patient/Family Education, Pain Management, Bladder Management, Medication Management, Bowel Management, Psychosocial Support, Skin Care/Wound Management, Cognitive Remediation/Compensation, Discharge Planning  PT interventions Ambulation/gait training, Balance/vestibular training, Cognitive remediation/compensation, Community reintegration, Discharge planning, Disease management/prevention, DME/adaptive equipment instruction, Functional mobility training, Neuromuscular re-education, Pain management, Patient/family education, Psychosocial support, Splinting/orthotics, Therapeutic Activities, Therapeutic Exercise, UE/LE Strength taining/ROM, UE/LE Coordination activities, Wheelchair propulsion/positioning  OT Interventions Training and development officer, Patient/family education, Engineer, drilling, Therapeutic Activities, Wheelchair propulsion/positioning, Therapeutic Exercise, Psychosocial support, Cognitive remediation/compensation, Community reintegration, Functional mobility training, Self Care/advanced ADL retraining, UE/LE Strength taining/ROM, UE/LE Coordination activities, Skin care/wound managment, Discharge planning, Disease mangement/prevention, Pain management, Visual/perceptual remediation/compensation  SLP Interventions Cognitive remediation/compensation, Cueing hierarchy, Functional tasks, Patient/family education, Environmental controls, Dysphagia/aspiration precaution training, Internal/external aids  TR Interventions    SW/CM Interventions     Barriers  to Discharge MD  Medical stability and Behavior  Nursing      PT Medical stability    OT Medical stability, Wound Care, Weight, Behavior    SLP      SW       Team  Discharge Planning: Destination: PT-Home ,OT- Home , SLP-Home Projected Follow-up: PT-Home health PT, 24 hour supervision/assistance, OT-  Home health OT, SLP-Home Health SLP, Skilled Nursing facility, 24 hour supervision/assistance Projected Equipment Needs: PT-Wheelchair (measurements), Wheelchair cushion (measurements), To be determined, OT- To be determined, SLP-None recommended by SLP Equipment Details: PT-TBD pending progress, OT-  Patient/family involved in discharge planning: PT- Patient, Family member/caregiver,  OT-Patient, Family member/caregiver, SLP-Patient  MD ELOS: 10-14 days. Medical Rehab Prognosis:  Good Assessment: 74 year old right-handed female with history of hypertension, asthma, chronic back pain with spinal cord stimulator implant, hyperlipidemia, migraine headaches, quit smoking 22 years ago.  Patient with recent admission [place] for COVID-19 infection was discharged to skilled nursing facility from [facility] after being hospitalized 02/14/2019 to 02/26/2019.  Presented to [facility] 03/01/2019 from skilled facility with altered mental status, loose stools, low-grade fever and decrease in appetite as well as stage I sacral decubiti.  Admission labs with WBC 20,200, hemoglobin 10.5, sodium 149, potassium 3.2, creatinine 0.59, C. difficile negative, urinalysis negative nitrite with urine culture less than 10,000 and significant growth, troponin high-sensitivity 34 that was repeated trending down to 28, blood cultures no growth to date, lactic acid 1.0, ammonia level 17.  Chest x-ray bilateral airspace disease markedly improved as compared to prior study.  Cranial CT scan no focal acute intracranial abnormality.  Chronic diffuse atrophy.  Carotid ultrasound with no significant stenosis.  CT cervical spine no  acute abnormality or fracture.  There was incidental findings of a enlarged right thyroid with coarse calcification recommendations were for outpatient follow-up thyroid ultrasound.  TSH level slightly low 0.065 started on low-dose Tapazole..  Negative CTA of head.  Echocardiogram unremarkable with ejection fraction of 55%.  Neurology services consulted maintain prophylactically on aspirin.  Mental status continues to improve maintained on low-dose Seroquel.  Maintained on a mechanical soft diet thin liquids.  Electrolyte imbalances much improved.  Patient with resulting functional deficits with mobility, transfers, endurance, cognition, self-care.  Will set goals for Min A with PT/OT and Supervision with SLP.   Due to the current state of emergency, patients may not be receiving their 3-hours of Medicare-mandated therapy.  See Team Conference Notes for weekly updates to the plan of care

## 2019-03-05 NOTE — Progress Notes (Signed)
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 mmHg AV VTI:            0.216 m AV Peak Grad:      9.2 mmHg AV Mean Grad:      5.0 mmHg LVOT Vmax:         87.90 cm/s LVOT Vmean:        55.200 cm/s LVOT VTI:          0.130 m LVOT/AV VTI ratio: 0.60  AORTA Ao Root diam: 2.90 cm MITRAL VALVE MV Area (PHT): 3.48 cm             SHUNTS MV Peak grad:  3.4 mmHg             Systemic VTI:  0.13 m MV Mean grad:  1.0 mmHg             Systemic Diam: 1.90 cm MV Vmax:       0.92 m/s MV Vmean:      51.0 cm/s MV VTI:        0.21 m MV PHT:        63.22 msec MV Decel Time: 218 msec MV E velocity: 57.70 cm/s 103 cm/s MV A velocity: 83.90 cm/s 70.3 cm/s MV E/A ratio:  0.69       1.5  Dwayne D Callwood MD Electronically signed by Yolonda Kida MD Signature Date/Time: 03/03/2019/8:54:18 PM    Final        Assessment/Plan: Diagnosis: Acute metabolic encephalopathy 1. Does the need for close, 24 hr/day medical supervision in concert with the patient's rehab needs make it unreasonable for this patient to be served in a less intensive setting? Yes 2. Co-Morbidities requiring supervision/potential complications: asthma, spinal stimulator, HTN, anxiety, depression, migraines, HLD, restless leg syndrome, chronic pain syndrome, recent COVID-19 infection 3. Due to bladder  management, bowel management, safety, skin/wound care, disease management, medication administration, pain management and patient education, does the patient require 24 hr/day rehab nursing? Yes 4. Does the patient require coordinated care of a physician, rehab nurse, therapy disciplines of PT, OT, SLP to address physical and functional deficits in the context of the above medical diagnosis(es)? Yes Addressing deficits in the following areas: balance, endurance, locomotion, strength, transferring, bowel/bladder control, bathing, dressing, feeding, grooming, toileting, cognition and psychosocial support 5. Can the patient actively participate in an intensive therapy program of at least 3 hrs of therapy per day at least 5 days per week? Yes 6. The potential for patient to make measurable gains while on inpatient rehab is good 7. Anticipated functional outcomes upon discharge from inpatient rehab are min assist  with PT, min  assist with OT, independent with SLP. 8. Estimated rehab length of stay to reach the above functional goals is: 2-3 weeks 9. Anticipated discharge destination: Home 10. Overall Rehab/Functional Prognosis: good   RECOMMENDATIONS: This patient's condition is appropriate for continued rehabilitative care in the following setting: CIR Patient has agreed to participate in recommended program. Yes Note that insurance prior authorization may be required for reimbursement for recommended care.   Comment: Reviewed neurology, PT, IM, OT notes: willing to participate in therapy with good attitude. MaxA x2 for transfers and bed mobility. Ambulation deferred. Care discussed with CIR admission coordinator Danne Baxter, PT Lieutenant Diego, and OT Jeni Salles.   Disposition: Patient would be an excellent CIR candidate. Prior to getting COVID in January, she was independent and driving. She has an excellent support system of her husband who will take time off of work to help her. She is  motivated and agreeable to participate in therapy and her cognitive status is improving as per her husband. Discussed with above team that our goal is for patient to demonstrate increased mobility during therapy, such as transfer to chair, in order to obtain insurance approval for CIR admission.    Chronic low back pain: Please apply heating pad for comfort. Can consider lidocaine pathc as well.    Constipation: Patient states she is not moving bowels regularly, and this could be contributing to her decreased appetite. Consider addition of senna-docusate BID to treat constipation.   Decreased appetite: If fails to improve when moving bowels regularly, consider addition of Megace to stimulate appetite.    Thank you for this consult. I will continue to follow in Mrs. Scarfo's care.    Sharon Ribas, MD 03/04/2019        Routing History
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
## 2019-03-05 NOTE — H&P (Signed)
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 the right internal carotid  artery to suggest a hemodynamically significant stenosis. RIGHT VERTEBRAL ARTERY:  Antegrade flow LEFT CAROTID ARTERY: There is a minimal to moderate amount of eccentric echogenic plaque within the left carotid bulb (image 36 and 47), extending to involve the origin and proximal aspects of the left internal carotid artery (image 53), not resulting in elevated peak systolic velocities within the interrogated course of the left internal carotid artery to suggest a hemodynamically significant stenosis. LEFT VERTEBRAL ARTERY:  Antegrade flow IMPRESSION: Minimal to moderate amount of bilateral atherosclerotic plaque, left greater than right, not resulting in a hemodynamically significant stenosis within either internal carotid artery. Electronically Signed   By: Sandi Mariscal M.D.   On: 03/03/2019 09:07   DG Chest Port 1 View  Result Date: 03/03/2019 CLINICAL DATA:  Fever.  Confusion. EXAM: PORTABLE CHEST  1 VIEW COMPARISON:  03/01/2019; 02/14/2019; 06/30/2015 FINDINGS: Grossly unchanged cardiac silhouette and mediastinal contours with atherosclerotic plaque within the thoracic aorta. There is persistent rightward deviation of the tracheal air column at the level of the thoracic aorta. There is persistent thickening the right paratracheal stripe presumably secondary prominent vasculature. Continued improved aeration of the lungs with persistent ill-defined heterogeneous opacities within the bilateral mid and lower lungs, left greater than right. No new focal airspace opacities. No pleural effusion or pneumothorax. Spinal stimulator leads overlie the mid/caudal aspect of the thoracic spine. Post cholecystectomy. IMPRESSION: 1. Continued improved aeration of the lungs with persistent bilateral mid lung interstitial opacities, nonspecific though given relatively rapid continued improvement, is favored to represent improving edema. 2. No new focal airspace opacities. Electronically Signed   By: Sandi Mariscal M.D.   On: 03/03/2019 09:09   DG Abd Portable 2V  Result Date: 03/04/2019 CLINICAL DATA:  Abdominal pain EXAM: PORTABLE ABDOMEN - 2 VIEW COMPARISON:  None. FINDINGS: Bowel gas pattern is unremarkable. No significant stool burden. Right upper quadrant surgical clips. Spinal stimulator is present. Lower lumbar degenerative changes are noted. IMPRESSION: Normal bowel gas pattern. Electronically Signed   By: Macy Mis M.D.   On: 03/04/2019 09:27   ECHOCARDIOGRAM COMPLETE  Result Date: 03/03/2019   ECHOCARDIOGRAM REPORT   Patient Name:   WANISHA SHIROMA Date of Exam: 03/03/2019 Medical Rec #:  [id number]           Height:       61.0 in Accession #:    [id number]          Weight:       164.5 lb Date of Birth:  1945-05-25          BSA:          1.74 m Patient Age:    38 years            BP:           124/61 mmHg Patient Gender: F                   HR:           77 bpm. Exam Location:  [facility] Procedure: 2D Echo,  Color Doppler and Cardiac Doppler Indications:     I163.9 Stroke  History:         Patient has no prior history of Echocardiogram examinations.                  Risk Factors:Hypertension and Dyslipidemia.  Sonographer:     Charmayne Sheer RDCS (AE) Referring Phys:  [id number] TAWFIKUL ALAM Diagnosing Phys: Yolonda Kida MD  Sonographer  Comments: No subcostal window and suboptimal apical window. IMPRESSIONS  1. Left ventricular ejection fraction, by visual estimation, is 50 to 55%. The left ventricle has normal function. Normal left ventricular posterior wall thickness. There is borderline left ventricular hypertrophy.  2. Left ventricular diastolic parameters are consistent with Grade I diastolic dysfunction (impaired relaxation).  3. The left ventricle demonstrates regional wall motion abnormalities.  4. Global right ventricle has normal systolic function.The right ventricular size is normal. No increase in right ventricular wall thickness.  5. Left atrial size was normal.  6. Right atrial size was normal.  7. The mitral valve is normal in structure. Trivial mitral valve regurgitation.  8. The tricuspid valve is normal in structure.  9. The tricuspid valve is normal in structure. Tricuspid valve regurgitation is trivial. 10. The aortic valve is normal in structure. Aortic valve regurgitation is not visualized. 11. The pulmonic valve was grossly normal. Pulmonic valve regurgitation is not visualized. FINDINGS  Left Ventricle: Left ventricular ejection fraction, by visual estimation, is 50 to 55%. The left ventricle has normal function. The left ventricle demonstrates regional wall motion abnormalities. The left ventricular internal cavity size was the left ventricle is normal in size. Normal left ventricular posterior wall thickness. There is borderline left ventricular hypertrophy. Concentric left ventricular hypertrophy. Left ventricular diastolic parameters are consistent with Grade I diastolic dysfunction (impaired  relaxation). Right Ventricle: The right ventricular size is normal. No increase in right ventricular wall thickness. Global RV systolic function is has normal systolic function. Left Atrium: Left atrial size was normal in size. Right Atrium: Right atrial size was normal in size Pericardium: There is no evidence of pericardial effusion. Mitral Valve: The mitral valve is normal in structure. Trivial mitral valve regurgitation. MV peak gradient, 3.4 mmHg. Tricuspid Valve: The tricuspid valve is normal in structure. Tricuspid valve regurgitation is trivial. Aortic Valve: The aortic valve is normal in structure. Aortic valve regurgitation is not visualized. Aortic valve mean gradient measures 5.0 mmHg. Aortic valve peak gradient measures 9.2 mmHg. Aortic valve area, by VTI measures 1.71 cm. Pulmonic Valve: The pulmonic valve was grossly normal. Pulmonic valve regurgitation is not visualized. Pulmonic regurgitation is not visualized. Aorta: The aortic root is normal in size and structure. IAS/Shunts: No atrial level shunt detected by color flow Doppler.  LEFT VENTRICLE PLAX 2D LVIDd:         3.66 cm  Diastology LVIDs:         2.67 cm  LV e' lateral:   10.20 cm/s LV PW:         0.82 cm  LV E/e' lateral: 5.7 LV IVS:        0.79 cm  LV e' medial:    9.90 cm/s LVOT diam:     1.90 cm  LV E/e' medial:  5.8 LV SV:         30 ml LV SV Index:   16.67 LVOT Area:     2.84 cm  LEFT ATRIUM         Index LA diam:    3.50 cm 2.01 cm/m  AORTIC VALVE                    PULMONIC VALVE AV Area (Vmax):    1.64 cm     PV Vmax:       0.99 m/s AV Area (Vmean):   1.48 cm     PV Vmean:      64.500 cm/s AV Area (VTI):  1.71 cm     PV VTI:        0.168 m AV Vmax:           152.00 cm/s  PV Peak grad:  3.9 mmHg AV Vmean:          106.000 cm/s PV Mean grad:  2.0 mmHg AV VTI:            0.216 m AV Peak Grad:      9.2 mmHg AV Mean Grad:      5.0 mmHg LVOT Vmax:         87.90 cm/s LVOT Vmean:        55.200 cm/s LVOT VTI:          0.130 m LVOT/AV  VTI ratio: 0.60  AORTA Ao Root diam: 2.90 cm MITRAL VALVE MV Area (PHT): 3.48 cm             SHUNTS MV Peak grad:  3.4 mmHg             Systemic VTI:  0.13 m MV Mean grad:  1.0 mmHg             Systemic Diam: 1.90 cm MV Vmax:       0.92 m/s MV Vmean:      51.0 cm/s MV VTI:        0.21 m MV PHT:        63.22 msec MV Decel Time: 218 msec MV E velocity: 57.70 cm/s 103 cm/s MV A velocity: 83.90 cm/s 70.3 cm/s MV E/A ratio:  0.69       1.5  Dwayne D Callwood MD Electronically signed by Yolonda Kida MD Signature Date/Time: 03/03/2019/8:54:18 PM    Final     Medical Problem List and Plan: 1.  Altered mental status secondary to acute metabolic encephalopathy and recent admission [facility] healthcare for COVID-19 02/14/2019 to 02/26/2019  -patient may shower  -ELOS/Goals: MinA 10-16 days 2.  Antithrombotics: -DVT/anticoagulation: Lovenox  -antiplatelet therapy: Aspirin 81 mg daily 3. Pain Management/chronic back pain with spinal cord stimulator implant: Patient on Neurontin 600 mg twice daily as well as oxycodone 5 mg every 6 hours as needed prior to admission held due to altered mental status.  2/5: Kpad for lower back pain for comfort.  4. Mood: Celexa 20 mg daily, melatonin 5 mg nightly  -antipsychotic agents: Seroquel 12.5 mg nightly 5. Neuropsych: This patient is capable of making decisions on her own behalf. 6. Skin/Wound Care: Routine skin checks.  Reported stage I sacral decubiti on admission to [facility] 7. Fluids/Electrolytes/Nutrition: Routine in and outs with follow-up chemistries 8.  Hypertension.  Tenormin 50 mg daily.  Monitor with increased mobility 9.  History of asthma.  Continue inhalers, Singulair 10 mg nightly.  Check oxygen saturations every shift 10.  Thrush with poor appetite.  Complete course of Diflucan.  Dietary follow-up  2/5: Poor appetite may be related to constipation. If fails to improve after better control of constipation, consider megace supplement.  11.  Enlargement of right  thyroid with coarse calcification.  Follow-up outpatient thyroid ultrasound.  TSH slightly low and started on low-dose Tapazole. 12.  Fever with leukocytosis and loose stools.  C. difficile negative.  WBC improved 13,600.  Latest blood cultures no growth to date.  Urinalysis negative. 13. Constipation:   2/5: Patient states she is not moving bowels regularly, and this could be contributing to her decreased appetite. Consider addition of senna-docusate BID to treat constipation.  Lavon Paganini  [place] [facility], PA-C 03/05/2019   I have personally performed a face to face diagnostic evaluation, including, but not limited to relevant history and physical exam findings, of this patient and developed relevant assessment and plan.  Additionally, I have reviewed and concur with the physician assistant's documentation above.  Leeroy Cha, MD

## 2019-03-05 NOTE — Plan of Care (Signed)
  Problem: Clinical Measurements: Goal: Ability to maintain clinical measurements within normal limits will improve Outcome: Progressing Goal: Respiratory complications will improve Outcome: Progressing   Problem: Activity: Goal: Risk for activity intolerance will decrease Outcome: Progressing   
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
## 2019-03-05 NOTE — Progress Notes (Signed)
Inpatient Rehabilitation Admissions Coordinator  I await insurance determination for a possible admit to inpt rehab at [facility] today. I contacted pt's spouse, Merry Proud at pt's bedside and he is aware and in agreement. We discussed cost of care if approved.  Danne Baxter, RN, MSN Rehab Admissions Coordinator [phone] 03/05/2019 11:57 AM

## 2019-03-05 NOTE — Progress Notes (Signed)
Pt admitted to room 4W23 around 1715. Oriented to floor, call bell and rehab fall policy. Tylenol given for comfort. Denies other discomfort. Continue plan of care.   Gerald Stabs, RN
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
## 2019-03-05 NOTE — Progress Notes (Addendum)
Inpatient Rehabilitation Admissions Coordinator  I have received approval to admit pt to inpt rehab today at [facility] room 4w23. I spoke with spouse by phone and he is aware. Dr. Leslye Peer and RN CM are aware. I will assist in making the arrangements to admit today.  Danne Baxter, RN, MSN Rehab Admissions Coordinator [phone] 03/05/2019 3:03 PM   [facility] has been called for transport. Pt to be admitted to Room 4 at [facility]

## 2019-03-06 ENCOUNTER — Inpatient Hospital Stay (HOSPITAL_COMMUNITY): Payer: Medicare HMO | Admitting: Occupational Therapy

## 2019-03-06 ENCOUNTER — Inpatient Hospital Stay (HOSPITAL_COMMUNITY): Payer: Medicare HMO | Admitting: Physical Therapy

## 2019-03-06 ENCOUNTER — Inpatient Hospital Stay (HOSPITAL_COMMUNITY): Payer: Medicare HMO | Admitting: Speech Pathology

## 2019-03-06 DIAGNOSIS — G9341 Metabolic encephalopathy: Principal | ICD-10-CM

## 2019-03-06 LAB — CULTURE, BLOOD (ROUTINE X 2)
Culture: NO GROWTH
Culture: NO GROWTH
Special Requests: ADEQUATE

## 2019-03-06 MED ORDER — SENNA 8.6 MG PO TABS
1.0000 | ORAL_TABLET | Freq: Two times a day (BID) | ORAL | Status: DC
  Administered 2019-03-06 – 2019-03-17 (×20): 8.6 mg via ORAL
  Filled 2019-03-06 (×22): qty 1

## 2019-03-06 MED ORDER — ENSURE ENLIVE PO LIQD
237.0000 mL | Freq: Two times a day (BID) | ORAL | Status: DC
  Administered 2019-03-06 – 2019-03-11 (×8): 237 mL via ORAL

## 2019-03-06 MED ORDER — ONDANSETRON 4 MG PO TBDP
ORAL_TABLET | ORAL | Status: AC
  Administered 2019-03-06: 4 mg
  Filled 2019-03-06: qty 1

## 2019-03-06 MED ORDER — PROCHLORPERAZINE MALEATE 5 MG PO TABS
5.0000 mg | ORAL_TABLET | Freq: Four times a day (QID) | ORAL | Status: DC | PRN
  Administered 2019-03-08: 5 mg via ORAL
  Filled 2019-03-06: qty 1

## 2019-03-06 MED ORDER — ADULT MULTIVITAMIN W/MINERALS CH
1.0000 | ORAL_TABLET | Freq: Every day | ORAL | Status: DC
  Administered 2019-03-06 – 2019-03-17 (×12): 1 via ORAL
  Filled 2019-03-06 (×12): qty 1

## 2019-03-06 MED ORDER — ONDANSETRON HCL 4 MG PO TABS: 4.0000 mg | ORAL_TABLET | Freq: Three times a day (TID) | ORAL | Status: DC | PRN
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
## 2019-03-06 NOTE — Progress Notes (Signed)
[place] PHYSICAL MEDICINE & REHABILITATION PROGRESS NOTE   Subjective/Complaints:  Pt reports so nauseated- didn't have meds for nausea- wrote for Compazine and Zofran prn for her- heard she was doing better after meds.   Says sweated all night. Denies constipation.    ROS: pt denies SOB, CP, (+) nausea, no vomiting; no constipation or diarrhea; no HA, no vision changes, or abd pain Objective:   No results found. Recent Labs    03/05/19 1927  WBC 9.6  HGB 10.0*  HCT 32.1*  PLT 297   Recent Labs    03/04/19 0648 03/04/19 0648 03/05/19 0823 03/05/19 1927  NA 141  --  142  --   K 3.5  --  3.8  --   CL 109  --  110  --   CO2 23  --  24  --   GLUCOSE 120*  --  101*  --   BUN 9  --  7*  --   CREATININE 0.34*   < > 0.30* 0.52  CALCIUM 8.4*  --  8.4*  --    < > = values in this interval not displayed.    Intake/Output Summary (Last 24 hours) at 03/06/2019 1439 Last data filed at 03/06/2019 0900 Gross per 24 hour  Intake 120 ml  Output --  Net 120 ml     Physical Exam: Vital Signs Blood pressure 113/63, pulse 94, temperature 98.1 F (36.7 C), temperature source Oral, resp. rate 16, height 5\' 1"  (1.549 m), weight 75.8 kg, SpO2 100 %.  Physical Exam: Reviewed vitals and labs General:Pleasantly confused.No apparent distress. Lying in bed supine; c/o nausea; more appropriate HEENT:conjugate gaze; oral mucosa dry Neck:Supple without JVD or lymphadenopathy Heart:Reg rate and rhythm. No murmurs rubs or gallops Chest:CTA bilaterally without wheezes, rales, or rhonchi; no distress Abdomen:Soft, non-tender, non-distended, bowel sounds positive. Extremities:No clubbing, cyanosis, or edema. Pulses are 2+ Skin:Clean and intact without signs of breakdown Neuro:CN 2-12 intact. Pleasantly confused. Follows simple commands consistently. Musculoskeletal:4+/5 strength throughout, except 4-/5 left hand grip. Psych:Pt's affect is appropriate. Pt is cooperative;  thought had been in rehab "days".      Assessment/Plan: 1. Functional deficits secondary to Altered mental status secondary to acute metabolic encephalopathy and recent admission [facility] healthcare for COVID-19 02/14/2019 to 02/26/2019  which require 3+ hours per day of interdisciplinary therapy in a comprehensive inpatient rehab setting.  Physiatrist is providing close team supervision and 24 hour management of active medical problems listed below.  Physiatrist and rehab team continue to assess barriers to discharge/monitor patient progress toward functional and medical goals  Care Tool:  Bathing    Body parts bathed by patient: Right arm, Left arm, Chest, Abdomen, Front perineal area, Right upper leg, Left upper leg, Face   Body parts bathed by helper: Buttocks, Right lower leg, Left lower leg     Bathing assist Assist Level: 2 Helpers     Upper Body Dressing/Undressing Upper body dressing   What is the patient wearing?: Dress    Upper body assist Assist Level: Moderate Assistance - Patient 50 - 74%    Lower Body Dressing/Undressing Lower body dressing      What is the patient wearing?: Incontinence brief     Lower body assist Assist for lower body dressing: Total Assistance - Patient < 25%     Toileting Toileting    Toileting assist Assist for toileting: 2 Helpers     Transfers Chair/bed transfer  Transfers assist     Chair/bed  transfer assist level: Maximal Assistance - Patient 25 - 49%     Locomotion Ambulation   Ambulation assist              Walk 10 feet activity   Assist           Walk 50 feet activity   Assist           Walk 150 feet activity   Assist           Walk 10 feet on uneven surface  activity   Assist           Wheelchair     Assist               Wheelchair 50 feet with 2 turns activity    Assist            Wheelchair 150 feet activity     Assist          Blood  pressure 113/63, pulse 94, temperature 98.1 F (36.7 C), temperature source Oral, resp. rate 16, height 5\' 1"  (1.549 m), weight 75.8 kg, SpO2 100 %.  1.  Altered mental status secondary to acute metabolic encephalopathy and recent admission [facility] healthcare for COVID-19 02/14/2019 to 02/26/2019             -patient may shower             -ELOS/Goals: MinA 10-16 days 2.  Antithrombotics: -DVT/anticoagulation: Lovenox             -antiplatelet therapy: Aspirin 81 mg daily 3. Pain Management/chronic back pain with spinal cord stimulator implant: Patient on Neurontin 600 mg twice daily as well as oxycodone 5 mg every 6 hours as needed prior to admission held due to altered mental status.             2/5: Kpad for lower back pain for comfort.  4. Mood: Celexa 20 mg daily, melatonin 5 mg nightly             -antipsychotic agents: Seroquel 12.5 mg nightly 5. Neuropsych: This patient is capable of making decisions on her own behalf. 6. Skin/Wound Care: Routine skin checks.  Reported stage I sacral decubiti on admission to [facility]  2/6- will d/w nursing and assess if still there.  7. Fluids/Electrolytes/Nutrition: Routine in and outs with follow-up chemistries 8.  Hypertension.  Tenormin 50 mg daily.  Monitor with increased mobility 9.  History of asthma.  Continue inhalers, Singulair 10 mg nightly.  Check oxygen saturations every shift 10.  Thrush with poor appetite.  Complete course of Diflucan.  Dietary follow-up             2/5: Poor appetite may be related to constipation. If fails to improve after better control of constipation, consider megace supplement.  11.  Enlargement of right thyroid with coarse calcification.  Follow-up outpatient thyroid ultrasound.  TSH slightly low and started on low-dose Tapazole. 12.  Fever with leukocytosis and loose stools.  C. difficile negative.  WBC improved 13,600.  Latest blood cultures no growth to date.  Urinalysis negative.  2/6- Last WBC 9.6k - con't to  monitor 13. Constipation:              2/5: Patient states she is not moving bowels regularly, and this could be contributing to her decreased appetite. Consider addition of senna-docusate BID to treat constipation.   2/6- LBM 2/4- early AM- needs to have BM- constipation probably playing a role in Nausea.  Will add Senokot 1 tab BID 14. Significant Nausea  2/6- gave Compazine and Zofran prn- helped nausea.    LOS: 1 days A FACE TO FACE EVALUATION WAS PERFORMED  Lillien Petronio 03/06/2019, 2:39 PM
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
## 2019-03-06 NOTE — Plan of Care (Signed)
  Problem: Consults Goal: RH GENERAL PATIENT EDUCATION Description: See Patient Education module for education specifics. Outcome: Progressing   Problem: RH BOWEL ELIMINATION Goal: RH STG MANAGE BOWEL WITH ASSISTANCE Description: STG Manage Bowel with mod I Assistance. Outcome: Progressing   Problem: RH BLADDER ELIMINATION Goal: RH STG MANAGE BLADDER WITH ASSISTANCE Description: STG Manage Bladder With mod I Assistance Outcome: Progressing   Problem: RH SKIN INTEGRITY Goal: RH STG MAINTAIN SKIN INTEGRITY WITH ASSISTANCE Description: STG Maintain Skin Integrity With mod I Assistance. Outcome: Progressing   Problem: RH SAFETY Goal: RH STG ADHERE TO SAFETY PRECAUTIONS W/ASSISTANCE/DEVICE Description: STG Adhere to Safety Precautions With cues and reminders  Outcome: Progressing   Problem: RH PAIN MANAGEMENT Goal: RH STG PAIN MANAGED AT OR BELOW PT'S PAIN GOAL Description: Pain level less than 4 on scale of 0-10 Outcome: Progressing   Problem: RH KNOWLEDGE DEFICIT GENERAL Goal: RH STG INCREASE KNOWLEDGE OF SELF CARE AFTER HOSPITALIZATION Description: Pt will be able to adhere to medication regimen, dietary and lifestyle modifications to prevent health complications mod I assist from family. Pt will be able to prevent falls with mod I assist from family.  Outcome: Progressing

## 2019-03-06 NOTE — Progress Notes (Signed)
Initial Nutrition Assessment  DOCUMENTATION CODES:   Not applicable  INTERVENTION:    Ensure Enlive po BID, each supplement provides 350 kcal and 20 grams of protein  MVI daily   NUTRITION DIAGNOSIS:   Increased nutrient needs related to wound healing as evidenced by estimated needs.  GOAL:   Patient will meet greater than or equal to 90% of their needs  MONITOR:   PO intake, Supplement acceptance, Weight trends, Labs, I & O's, Skin  REASON FOR ASSESSMENT:   Malnutrition Screening Tool    ASSESSMENT:   Patient with PMH significant for HTN, asthma, chronic back pain with spinal cord stimulator implant, and HLD. Recently admitted at [facility] for AMS and COVID-19 infection. Transferred to [facility] for inpatient rehab.   RD working remotely.  Unable to reach pt by phone. Unsure of appetite status PTA. SLP recommends DYS 3 with thin liquids. Breakfast charted as 50% completed. RD to provide supplementation to maximize kcal and protein this admission.   Records indicate pt weighed 188 lb on 1/17 at [facility] and 167 lb this admission (11% in less than one month, significant for time frame). Unable to diagnosis malnutrition without detailed recall and NFPE.   Medications: Vitamin D, Vit B12, 220 mg zinc sulfate  Labs: reviewed   Diet Order:   Diet Order            DIET DYS 3 Room service appropriate? Yes with Assist; Fluid consistency: Thin  Diet effective now              EDUCATION NEEDS:   Not appropriate for education at this time  Skin:  Skin Assessment: Skin Integrity Issues: Skin Integrity Issues:: Stage I Stage I: sacrum  Last BM:  2/4  Height:   Ht Readings from Last 1 Encounters:  03/05/19 5\' 1"  (1.549 m)    Weight:   Wt Readings from Last 1 Encounters:  03/05/19 75.8 kg    Ideal Body Weight:  47.7 kg  BMI:  Body mass index is 31.59 kg/m.  Estimated Nutritional Needs:   Kcal:  1750-1950 kcal  Protein:  85-100 grams  Fluid:  >/= 1.7  L/day   Mariana Single RD, LDN Clinical Nutrition Pager listed in [place]

## 2019-03-06 NOTE — Progress Notes (Signed)
Patient stated she has small black hearting aids inserted in her ears. Per patient's husband , patient does not have hearing aids.

## 2019-03-07 MED ORDER — FLUCONAZOLE 100 MG PO TABS
100.0000 mg | ORAL_TABLET | Freq: Every day | ORAL | Status: AC
  Administered 2019-03-08 – 2019-03-11 (×4): 100 mg via ORAL
  Filled 2019-03-07 (×4): qty 1

## 2019-03-07 NOTE — Progress Notes (Signed)
[facility] PHYSICAL MEDICINE & REHABILITATION PROGRESS NOTE   Subjective/Complaints:  Pt reports she had a BM- nausea is resolved.   Was confused as to where she is; in hospital and needed explanation why she's here.    ROS: pt denies SOB, CP, (+) nausea, no vomiting; no constipation or diarrhea; no HA, no vision changes, or abd pain Objective:   No results found. Recent Labs    03/05/19 1927  WBC 9.6  HGB 10.0*  HCT 32.1*  PLT 297   Recent Labs    03/05/19 0823 03/05/19 1927  NA 142  --   K 3.8  --   CL 110  --   CO2 24  --   GLUCOSE 101*  --   BUN 7*  --   CREATININE 0.30* 0.52  CALCIUM 8.4*  --     Intake/Output Summary (Last 24 hours) at 03/07/2019 0945 Last data filed at 03/06/2019 1824 Gross per 24 hour  Intake 700 ml  Output --  Net 700 ml     Physical Exam: Vital Signs Blood pressure 140/68, pulse 94, temperature 98.2 F (36.8 C), resp. rate 16, height 5\' 1"  (1.549 m), weight 75.8 kg, SpO2 100 %.  Physical Exam: Reviewed vitals and labs General:Pleasantly confused.No apparent distress. Lying in bed supine; watching TV, NAD HEENT:conjugate gaze; oral mucosa dry Neck:Supple without JVD or lymphadenopathy Heart:Reg rate and rhythm. No murmurs rubs or gallops Chest:CTA bilaterally without wheezes, rales, or rhonchi; no distress Abdomen:Soft, non-tender, non-distended, bowel sounds positive. Extremities:No clubbing, cyanosis, or edema. Pulses are 2+ Skin:Clean and intact without signs of breakdown Neuro:CN 2-12 intact. Pleasantly confused. Follows simple commands consistently. Musculoskeletal:4+/5 strength throughout, except 4-/5 left hand grip. Psych:Pt's affect is appropriate. Pt is cooperative; wasn't sure where she was and why here     Assessment/Plan: 1. Functional deficits secondary to Altered mental status secondary to acute metabolic encephalopathy and recent admission [facility] healthcare for COVID-19 02/14/2019 to 02/26/2019  which  require 3+ hours per day of interdisciplinary therapy in a comprehensive inpatient rehab setting.  Physiatrist is providing close team supervision and 24 hour management of active medical problems listed below.  Physiatrist and rehab team continue to assess barriers to discharge/monitor patient progress toward functional and medical goals  Care Tool:  Bathing    Body parts bathed by patient: Right arm, Left arm, Chest, Abdomen, Front perineal area, Right upper leg, Left upper leg, Face   Body parts bathed by helper: Buttocks, Right lower leg, Left lower leg     Bathing assist Assist Level: 2 Helpers     Upper Body Dressing/Undressing Upper body dressing   What is the patient wearing?: Hospital gown only    Upper body assist Assist Level: Minimal Assistance - Patient > 75%    Lower Body Dressing/Undressing Lower body dressing      What is the patient wearing?: Incontinence brief     Lower body assist Assist for lower body dressing: Total Assistance - Patient < 25%     Toileting Toileting    Toileting assist Assist for toileting: 2 Helpers     Transfers Chair/bed transfer  Transfers assist     Chair/bed transfer assist level: Maximal Assistance - Patient 25 - 49%     Locomotion Ambulation   Ambulation assist   Ambulation activity did not occur: Safety/medical concerns          Walk 10 feet activity   Assist  Walk 10 feet activity did not occur: Safety/medical concerns  Walk 50 feet activity   Assist Walk 50 feet with 2 turns activity did not occur: Safety/medical concerns         Walk 150 feet activity   Assist Walk 150 feet activity did not occur: Safety/medical concerns         Walk 10 feet on uneven surface  activity   Assist Walk 10 feet on uneven surfaces activity did not occur: Safety/medical concerns         Wheelchair     Assist Will patient use wheelchair at discharge?: Yes Type of Wheelchair:  Manual    Wheelchair assist level: Dependent - Patient 0% Max wheelchair distance: 150'    Wheelchair 50 feet with 2 turns activity    Assist        Assist Level: Dependent - Patient 0%   Wheelchair 150 feet activity     Assist      Assist Level: Dependent - Patient 0%   Blood pressure 140/68, pulse 94, temperature 98.2 F (36.8 C), resp. rate 16, height 5\' 1"  (1.549 m), weight 75.8 kg, SpO2 100 %.  1.  Altered mental status secondary to acute metabolic encephalopathy and recent admission [facility] healthcare for COVID-19 02/14/2019 to 02/26/2019             -patient may shower             -ELOS/Goals: MinA 10-16 days 2.  Antithrombotics: -DVT/anticoagulation: Lovenox             -antiplatelet therapy: Aspirin 81 mg daily 3. Pain Management/chronic back pain with spinal cord stimulator implant: Patient on Neurontin 600 mg twice daily as well as oxycodone 5 mg every 6 hours as needed prior to admission held due to altered mental status.             2/5: Kpad for lower back pain for comfort.  4. Mood: Celexa 20 mg daily, melatonin 5 mg nightly             -antipsychotic agents: Seroquel 12.5 mg nightly 5. Neuropsych: This patient is capable of making decisions on her own behalf. 6. Skin/Wound Care: Routine skin checks.  Reported stage I sacral decubiti on admission to [facility]  2/6- will d/w nursing and assess if still there.  7. Fluids/Electrolytes/Nutrition: Routine in and outs with follow-up chemistries 8.  Hypertension.  Tenormin 50 mg daily.  Monitor with increased mobility 9.  History of asthma.  Continue inhalers, Singulair 10 mg nightly.  Check oxygen saturations every shift 10.  Thrush with poor appetite.  Complete course of Diflucan.  Dietary follow-up             2/5: Poor appetite may be related to constipation. If fails to improve after better control of constipation, consider megace supplement.  11.  Enlargement of right thyroid with coarse calcification.  Follow-up  outpatient thyroid ultrasound.  TSH slightly low and started on low-dose Tapazole. 12.  Fever with leukocytosis and loose stools.  C. difficile negative.  WBC improved 13,600.  Latest blood cultures no growth to date.  Urinalysis negative.  2/6- Last WBC 9.6k - con't to monitor 13. Constipation:              2/5: Patient states she is not moving bowels regularly, and this could be contributing to her decreased appetite. Consider addition of senna-docusate BID to treat constipation.   2/6- LBM 2/4- early AM- needs to have BM- constipation probably playing a role in Nausea. Will add Senokot 1  tab BID  2/7- had BM per pt, but per chart, did not- not clear- could have had BM with therapy- will ask nursing to address.  14. Significant Nausea  2/6- gave Compazine and Zofran prn- helped nausea.   2/7- improved   LOS: 2 days A FACE TO FACE EVALUATION WAS PERFORMED  Rogan Wigley 03/07/2019, 9:45 AM

## 2019-03-08 ENCOUNTER — Inpatient Hospital Stay (HOSPITAL_COMMUNITY): Payer: Medicare HMO

## 2019-03-08 ENCOUNTER — Inpatient Hospital Stay (HOSPITAL_COMMUNITY): Payer: Medicare HMO | Admitting: Speech Pathology

## 2019-03-08 ENCOUNTER — Inpatient Hospital Stay (HOSPITAL_COMMUNITY): Payer: Medicare HMO | Admitting: Physical Therapy

## 2019-03-08 ENCOUNTER — Inpatient Hospital Stay (HOSPITAL_COMMUNITY): Payer: Medicare HMO | Admitting: Occupational Therapy

## 2019-03-08 DIAGNOSIS — R1310 Dysphagia, unspecified: Secondary | ICD-10-CM

## 2019-03-08 DIAGNOSIS — R0989 Other specified symptoms and signs involving the circulatory and respiratory systems: Secondary | ICD-10-CM

## 2019-03-08 DIAGNOSIS — E8809 Other disorders of plasma-protein metabolism, not elsewhere classified: Secondary | ICD-10-CM

## 2019-03-08 DIAGNOSIS — R159 Full incontinence of feces: Secondary | ICD-10-CM

## 2019-03-08 DIAGNOSIS — Z969 Presence of functional implant, unspecified: Secondary | ICD-10-CM

## 2019-03-08 DIAGNOSIS — I10 Essential (primary) hypertension: Secondary | ICD-10-CM

## 2019-03-08 DIAGNOSIS — E46 Unspecified protein-calorie malnutrition: Secondary | ICD-10-CM

## 2019-03-08 DIAGNOSIS — D649 Anemia, unspecified: Secondary | ICD-10-CM

## 2019-03-08 DIAGNOSIS — G894 Chronic pain syndrome: Secondary | ICD-10-CM

## 2019-03-08 LAB — CBC WITH DIFFERENTIAL/PLATELET
Abs Immature Granulocytes: 0.05 10*3/uL (ref 0.00–0.07)
Basophils Absolute: 0.1 10*3/uL (ref 0.0–0.1)
Basophils Relative: 1 %
Eosinophils Absolute: 0.7 10*3/uL — ABNORMAL HIGH (ref 0.0–0.5)
Eosinophils Relative: 9 %
HCT: 34.1 % — ABNORMAL LOW (ref 36.0–46.0)
Hemoglobin: 10.4 g/dL — ABNORMAL LOW (ref 12.0–15.0)
Immature Granulocytes: 1 %
Lymphocytes Relative: 23 %
Lymphs Abs: 1.6 10*3/uL (ref 0.7–4.0)
MCH: 27.9 pg (ref 26.0–34.0)
MCHC: 30.5 g/dL (ref 30.0–36.0)
MCV: 91.4 fL (ref 80.0–100.0)
Monocytes Absolute: 0.5 10*3/uL (ref 0.1–1.0)
Monocytes Relative: 7 %
Neutro Abs: 4.3 10*3/uL (ref 1.7–7.7)
Neutrophils Relative %: 59 %
Platelets: 384 10*3/uL (ref 150–400)
RBC: 3.73 MIL/uL — ABNORMAL LOW (ref 3.87–5.11)
RDW: 15.8 % — ABNORMAL HIGH (ref 11.5–15.5)
WBC: 7.3 10*3/uL (ref 4.0–10.5)
nRBC: 0 % (ref 0.0–0.2)

## 2019-03-08 LAB — COMPREHENSIVE METABOLIC PANEL
ALT: 31 U/L (ref 0–44)
AST: 18 U/L (ref 15–41)
Albumin: 2.2 g/dL — ABNORMAL LOW (ref 3.5–5.0)
Alkaline Phosphatase: 63 U/L (ref 38–126)
Anion gap: 13 (ref 5–15)
BUN: 5 mg/dL — ABNORMAL LOW (ref 8–23)
CO2: 30 mmol/L (ref 22–32)
Calcium: 10.2 mg/dL (ref 8.9–10.3)
Chloride: 102 mmol/L (ref 98–111)
Creatinine, Ser: 0.53 mg/dL (ref 0.44–1.00)
GFR calc Af Amer: >60 mL/min (ref >60)
GFR calc non Af Amer: >60 mL/min (ref >60)
Glucose, Bld: 113 mg/dL — ABNORMAL HIGH (ref 70–99)
Potassium: 4 mmol/L (ref 3.5–5.1)
Sodium: 145 mmol/L (ref 135–145)
Total Bilirubin: 0.6 mg/dL (ref 0.3–1.2)
Total Protein: 5.7 g/dL — ABNORMAL LOW (ref 6.5–8.1)

## 2019-03-08 MED ORDER — PRO-STAT SUGAR FREE PO LIQD
30.0000 mL | Freq: Two times a day (BID) | ORAL | Status: DC
  Administered 2019-03-08 – 2019-03-11 (×3): 30 mL via ORAL
  Filled 2019-03-08 (×17): qty 30

## 2019-03-08 NOTE — Care Management (Signed)
[facility] Individual Statement of Services  Patient Name:  [name]  Date:  03/08/2019  Welcome to the [facility].  Our goal is to provide you with an individualized program based on your diagnosis and situation, designed to meet your specific needs.  With this comprehensive rehabilitation program, you will be expected to participate in at least 3 hours of rehabilitation therapies Monday-Friday, with modified therapy programming on the weekends.  Your rehabilitation program will include the following services:  Physical Therapy (PT), Occupational Therapy (OT), Speech Therapy (ST), 24 hour per day rehabilitation nursing, Neuropsychology, Case Management (Social Worker), Rehabilitation Medicine, Nutrition Services and Pharmacy Services  Weekly team conferences will be held on Wednesdays to discuss your progress.  Your Social Worker will talk with you frequently to get your input and to update you on team discussions.  Team conferences with you and your family in attendance may also be held.  Expected length of stay: 14-16 days    Overall anticipated outcome: Minimal Assistance  Depending on your progress and recovery, your program may change. Your Social Worker will coordinate services and will keep you informed of any changes. Your Social Worker's name and contact numbers are listed  below.  The following services may also be recommended but are not provided by the [facility] will be made to provide these services after discharge if needed.  Arrangements include referral to agencies that provide these services.  Your insurance has been verified to be:  [employer]  Your primary doctor is:  Christianne Dolin  Pertinent information will be shared with your doctor and your  insurance company.  Social Worker:  Loralee Pacas, MSW, [facility] Information discussed with and copy given to patient by: Rana Snare, 03/08/2019, 10:22 AM

## 2019-03-08 NOTE — Progress Notes (Signed)
[facility] PHYSICAL MEDICINE & REHABILITATION PROGRESS NOTE   Subjective/Complaints: Patient seen sitting up in bed this morning.  She states she slept well overnight.  She states had a good weekend.  Some confusion noted.  ROS: Denies CP, SOB, N/V/D   Objective:   No results found. Recent Labs    03/05/19 1927 03/08/19 0456  WBC 9.6 7.3  HGB 10.0* 10.4*  HCT 32.1* 34.1*  PLT 297 384   Recent Labs    03/05/19 1927 03/08/19 0456  NA  --  145  K  --  4.0  CL  --  102  CO2  --  30  GLUCOSE  --  113*  BUN  --  5*  CREATININE 0.52 0.53  CALCIUM  --  10.2    Intake/Output Summary (Last 24 hours) at 03/08/2019 1008 Last data filed at 03/08/2019 0600 Gross per 24 hour  Intake 717 ml  Output --  Net 717 ml     Physical Exam: Vital Signs Blood pressure 130/63, pulse 91, temperature 97.8 F (36.6 C), resp. rate 18, height 5\' 1"  (1.549 m), weight 75.8 kg, SpO2 100 %. Constitutional: No distress . Vital signs reviewed. HENT: Normocephalic.  Atraumatic. Eyes: EOMI. No discharge. Cardiovascular: No JVD. Respiratory: Normal effort.  No stridor. GI: Non-distended. Skin: Warm and dry.  Intact. Psych: Confused. Musc: No edema in extremities.  No tenderness in extremities. Neuro:Alert and oriented, except for year Motor: Bilateral upper extremities: 4/5 proximal distal Bilateral lower extremities: Hip flexion 4 -/5, distally 4/5  Assessment/Plan: 1. Functional deficits secondary to Altered mental status secondary to acute metabolic encephalopathy and recent admission [facility] healthcare for COVID-19 02/14/2019 to 02/26/2019  which require 3+ hours per day of interdisciplinary therapy in a comprehensive inpatient rehab setting.  Physiatrist is providing close team supervision and 24 hour management of active medical problems listed below.  Physiatrist and rehab team continue to assess barriers to discharge/monitor patient progress toward functional and medical goals  Care  Tool:  Bathing    Body parts bathed by patient: Right arm, Left arm, Chest, Abdomen, Front perineal area, Right upper leg, Left upper leg, Face   Body parts bathed by helper: Buttocks, Right lower leg, Left lower leg     Bathing assist Assist Level: Maximal Assistance - Patient 24 - 49%     Upper Body Dressing/Undressing Upper body dressing   What is the patient wearing?: Pull over shirt    Upper body assist Assist Level: Minimal Assistance - Patient > 75%    Lower Body Dressing/Undressing Lower body dressing      What is the patient wearing?: Incontinence brief, Pants     Lower body assist Assist for lower body dressing: Maximal Assistance - Patient 25 - 49%     Toileting Toileting    Toileting assist Assist for toileting: Maximal Assistance - Patient 25 - 49%     Transfers Chair/bed transfer  Transfers assist     Chair/bed transfer assist level: Moderate Assistance - Patient 50 - 74%     Locomotion Ambulation   Ambulation assist   Ambulation activity did not occur: Safety/medical concerns          Walk 10 feet activity   Assist  Walk 10 feet activity did not occur: Safety/medical concerns        Walk 50 feet activity   Assist Walk 50 feet with 2 turns activity did not occur: Safety/medical concerns         Walk 150  feet activity   Assist Walk 150 feet activity did not occur: Safety/medical concerns         Walk 10 feet on uneven surface  activity   Assist Walk 10 feet on uneven surfaces activity did not occur: Safety/medical concerns         Wheelchair     Assist Will patient use wheelchair at discharge?: Yes Type of Wheelchair: Manual    Wheelchair assist level: Dependent - Patient 0% Max wheelchair distance: 150'    Wheelchair 50 feet with 2 turns activity    Assist        Assist Level: Dependent - Patient 0%   Wheelchair 150 feet activity     Assist      Assist Level: Dependent - Patient  0%   Blood pressure 130/63, pulse 91, temperature 97.8 F (36.6 C), resp. rate 18, height 5\' 1"  (1.549 m), weight 75.8 kg, SpO2 100 %.  1.  Altered mental status secondary to acute metabolic encephalopathy and recent admission [facility] healthcare for COVID-19 02/14/2019 to 02/26/2019   Continue CIR 2.  Antithrombotics: -DVT/anticoagulation: Lovenox             -antiplatelet therapy: Aspirin 81 mg daily 3. Pain Management/chronic back pain with spinal cord stimulator implant:   Patient on Neurontin 600 mg twice daily as well as oxycodone 5 mg every 6 hours as needed prior to admission, on hold due to altered mental status.             Kpad for lower back pain for comfort.  4. Mood: Celexa 20 mg daily, melatonin 5 mg nightly             -antipsychotic agents: Seroquel 12.5 mg nightly 5. Neuropsych: This patient is not capable of making decisions on her own behalf. 6. Skin/Wound Care: Routine skin checks.  Reported stage I sacral decubiti on admission to [facility] 7. Fluids/Electrolytes/Nutrition: Routine in and outs 8.  Hypertension.  Tenormin 50 mg daily.    Slightly labile on 2/8, monitor for trend  Monitor with increased mobility 9.  History of asthma.  Continue inhalers, Singulair 10 mg nightly.  Check oxygen saturations every shift 10.  Thrush with poor appetite.  Completed course of Diflucan.  Dietary follow-up  Variable p.o. intake, eating 10-80% of meals 11.  Enlargement of right thyroid with coarse calcification.  Follow-up outpatient thyroid ultrasound.  TSH slightly low and started on low-dose Tapazole. 12.  Fever with leukocytosis: Resolved 13.  Bowel incontinence:   KUB ordered  C. difficile negative  Continue meds for now 14. Significant Nausea  Improved 15.  Dysphagia  D3 thins, advance as tolerated 16.  Hypoalbuminemia  Supplement initiated 17.  Anemia  Hemoglobin 10.4 on 2/8  Continue to monitor  LOS: 3 days A FACE TO FACE EVALUATION WAS PERFORMED  Netra Postlethwait [name] 03/08/2019, 10:08 AM

## 2019-03-08 NOTE — Progress Notes (Signed)
Inpatient Rehabilitation  Patient information reviewed and entered into eRehab system by [place] M. Patria Warzecha, M.A., CCC/SLP, PPS Coordinator.  Information including medical coding, functional ability and quality indicators will be reviewed and updated through discharge.    
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
## 2019-03-08 NOTE — Progress Notes (Signed)
Speech Language Pathology Daily Session Note  Patient Details  Name: [name] MRN: [id number] Date of Birth: Jun 04, 1945  Today's Date: 03/08/2019 SLP Individual Time: 1300-1400 SLP Individual Time Calculation (min): 60 min  Short Term Goals: Week 1: SLP Short Term Goal 1 (Week 1): Pt will consume dys 3 textures and thin liquids with mod I use of swallowing precautions and minimal overt s/s of aspiration. SLP Short Term Goal 2 (Week 1): Pt will consume therapeutic trials of regular textures with supervision cues for use of swallowing precautions and minimal overt s/s of aspiration over 3 consecutive sessions prior to advancement. SLP Short Term Goal 3 (Week 1): Pt will sustain her attention to basic familiar tasks for 10 minute intervals with min verbal cues for redirection. SLP Short Term Goal 4 (Week 1): Pt will complete basic, familiar tasks with min verbal cues for functional problem solving. SLP Short Term Goal 5 (Week 1): Pt will use external aids to recall daily information with min verbal cues.  Skilled Therapeutic Interventions: Skilled treatment session focused on dysphagia and cognitive goals. SLP facilitated session with trials of regular textures. Patient demonstrated prolonged mastication despite a small bolus size with overt coughing, suspect due to poor awareness of bolus.  Recommend patient continue Dys. 3 textures. Patient requested to use the commode and required Min verbal cues for safety and attention to task due to attempting to talk to SLP. Patient was continent of bowel. SLP also facilitated session by providing Min A verbal cues for recall of her current medications and their functions. Recommend organizing a BID pill box during next session. Throughout session, patient's mentation appeared to fluctuate as she appeared intermittently confused during functional conversations, functional tasks, and thinking this SLP was someone she knew previously. Patient left upright  in wheelchair with alarm on and all needs within reach. Continue with current plan of care.   Pain No/Denies Pain   Therapy/Group: Individual Therapy  Lindora Alviar 03/08/2019, 2:32 PM

## 2019-03-08 NOTE — Progress Notes (Signed)
Physical Therapy Session Note  Patient Details  Name: [name] MRN: [id number] Date of Birth: 04-05-1945  Today's Date: 03/08/2019 PT Individual Time: 1105-1200 PT Individual Time Calculation (min): 55 min   Short Term Goals: Week 1:  PT Short Term Goal 1 (Week 1): Pt will perform least restrictive transfer with mod A consistently PT Short Term Goal 2 (Week 1): Pt will initiate w/c mobility PT Short Term Goal 3 (Week 1): Pt will initiate standing as safe and able  Skilled Therapeutic Interventions/Progress Updates: Pt presented in w/c with husband Merry Proud present agreeable to therapy. Pt expressing need for bathroom. Performed stand pivot to toilet with use of wall rail, modA and max verbal cues. PTA provided total A for clothing management (+void). PTA provided maxA for peri-hygiene and clothing management and was able to perform stand pivot to return to w/c in same manner. Pt transported to rehab gym and performed squat pivot to mat modA. Pt performed STS x 4 from mat with minA fading to modA with fatigue. Pt was able to maintain standing for bouts up to 30 sec then would impulsively sit. Pt attempted to perform standing march however due to poor knee stability was unable to maintain standing after 1 march with each LE. Pt also required increased time between bouts due to fatigue. Performed squat pivot to w/c modA with max cues for sequencing. Once in w/c pt required max cues for repositioning in w/c. Attempted to have pt propel w/c with BUE however pt would 'push" 2 -3 ft then become distracted and require re-direction. Pt transported back to room at end of session and remained in w/c with belt alarm on, call bell within reach and needs met.      Therapy Documentation Precautions:  Precautions Precautions: Fall Precaution Comments: spinal cord stimulator Restrictions Weight Bearing Restrictions: No General:   Vital Signs: Therapy Vitals Temp: 98 F (36.7 C) Pulse Rate:  75 Resp: 18 BP: 134/76 Patient Position (if appropriate): Lying Oxygen Therapy SpO2: 98 % O2 Device: Room Air    Therapy/Group: Individual Therapy  Dajane Valli  Kasidy Gianino, PTA  03/08/2019, 3:37 PM

## 2019-03-08 NOTE — Plan of Care (Signed)
  Problem: Consults Goal: RH GENERAL PATIENT EDUCATION Description: See Patient Education module for education specifics. Outcome: Progressing   Problem: RH BOWEL ELIMINATION Goal: RH STG MANAGE BOWEL WITH ASSISTANCE Description: STG Manage Bowel with mod I Assistance. Outcome: Progressing   Problem: RH BLADDER ELIMINATION Goal: RH STG MANAGE BLADDER WITH ASSISTANCE Description: STG Manage Bladder With mod I Assistance Outcome: Progressing   Problem: RH SKIN INTEGRITY Goal: RH STG MAINTAIN SKIN INTEGRITY WITH ASSISTANCE Description: STG Maintain Skin Integrity With mod I Assistance. Outcome: Progressing   Problem: RH SAFETY Goal: RH STG ADHERE TO SAFETY PRECAUTIONS W/ASSISTANCE/DEVICE Description: STG Adhere to Safety Precautions With cues and reminders  Outcome: Progressing   Problem: RH PAIN MANAGEMENT Goal: RH STG PAIN MANAGED AT OR BELOW PT'S PAIN GOAL Description: Pain level less than 4 on scale of 0-10 Outcome: Progressing   Problem: RH KNOWLEDGE DEFICIT GENERAL Goal: RH STG INCREASE KNOWLEDGE OF SELF CARE AFTER HOSPITALIZATION Description: Pt will be able to adhere to medication regimen, dietary and lifestyle modifications to prevent health complications mod I assist from family. Pt will be able to prevent falls with mod I assist from family.  Outcome: Progressing

## 2019-03-08 NOTE — Progress Notes (Signed)
Occupational Therapy Session Note  Patient Details  Name: [name] MRN: [id number] Date of Birth: Aug 24, 1945  Today's Date: 03/08/2019 OT Individual Time: 1415-1445 OT Individual Time Calculation (min): 30 min    Short Term Goals: Week 1:  OT Short Term Goal 1 (Week 1): Pt will complete toileting tasks with 1 assist OT Short Term Goal 2 (Week 1): Pt will complete 2 self care tasks w/c level at sink to increase OOB tolerance OT Short Term Goal 3 (Week 1): Pt will complete LB dressing tasks with 1 assist at sit<stand level  Skilled Therapeutic Interventions/Progress Updates:    Pt received sitting up in w/c with husband present. Pt agreeable to session. Pt completed 3x sit <> stands from w/c with min A. Pt able to maintain stand for ~2-3 min to complete bimanual laundry task with min stabilization for balance. Pt doffed shirt and pants, min A to remove pants to don new gown. Pt required increased encouragement/cueing to participate, with husbands support as well. Pt completed short distance functional mobility to the bed with the RW with mod A. Pt transitioned back to supine in bed and was left with all needs met. Husband present, bed alarm set.   Therapy Documentation Precautions:  Precautions Precautions: Fall Precaution Comments: spinal cord stimulator Restrictions Weight Bearing Restrictions: No   Therapy/Group: Individual Therapy  Curtis Sites 03/08/2019, 3:21 PM

## 2019-03-08 NOTE — Progress Notes (Signed)
Occupational Therapy Session Note  Patient Details  Name: [name] MRN: [id number] Date of Birth: Jun 09, 1945  Today's Date: 03/08/2019 OT Individual Time: [id number] OT Individual Time Calculation (min): 74 min   Short Term Goals: Week 1:  OT Short Term Goal 1 (Week 1): Pt will complete toileting tasks with 1 assist OT Short Term Goal 2 (Week 1): Pt will complete 2 self care tasks w/c level at sink to increase OOB tolerance OT Short Term Goal 3 (Week 1): Pt will complete LB dressing tasks with 1 assist at sit<stand level  Skilled Therapeutic Interventions/Progress Updates:    Pt greeted semi-reclined in bed with breakfast present. Pt did not eat much of breakfast. She stated she had a bubbly stomach and had a BM in the bed and did not feel like eating after that. Pt reported she felt like she had been incontinent again of BM. Pt rolled in bed with min/mod A for total A to wash buttocks  and brief change. Pt was able to assist with washing peri-area with HOB slightly elevated. OT thread pt's pant legs, then she was able to pull pants above her hips using hip bridge and rolling. Pt continues to have intermittent confusion telling therapist "{ I know your brother" (therapist does not have a brother), and went on tangential story that was out of context. Pt oriented to person and place, but thought the year was 2026. Pt followed commands well and was easily re-directed. Pt came to sitting EOB with min A for LB and Mod A to elevate trunk. Pt able to maintain sitting balance with close supervision. Pt completed stand-pivot from EOB to wc with mod A to power up, and mod A to pivot. UB bathing/dressing completed from wc at the sink with min A. Pt brought down to therapy gym and worked on sit<>stand and standing balance/endurance within card matching task. Pt needed max verbal cues for hand placement with sit<>stand and mod A to achieve standing. Pt tolerated standing for 30 seconds, before  impulsively sitting back down. Pt stated her stomach was bubbling again. OT asked if this meant she had had a BM, and she said yes. OT returned pt to the room to get cleaned up. Worked on sit<>stand at the sink with mod A and verbal cues again for hand placement. Pt more incontient of BM in standing so OT placed BSC behind pt with max  A for clothing management. Pt voided bladder and had more BM on BSC. Nurse tech enterred to assist OT. Max A for peri-care and brief change in standing. Second nurse tech exchanged [facility] for wc and pt returned to sitting. She tolerated standing for 45 seconds at longest bout. Pt left in care of nurse techs.   Therapy Documentation Precautions:  Precautions Precautions: Fall Precaution Comments: spinal cord stimulator Restrictions Weight Bearing Restrictions: No Pain: No pain reported  Therapy/Group: Individual Therapy  Valma Cava 03/08/2019, 9:02 AM

## 2019-03-09 ENCOUNTER — Inpatient Hospital Stay (HOSPITAL_COMMUNITY): Payer: Medicare HMO | Admitting: Physical Therapy

## 2019-03-09 ENCOUNTER — Inpatient Hospital Stay (HOSPITAL_COMMUNITY): Payer: Medicare HMO | Admitting: Occupational Therapy

## 2019-03-09 ENCOUNTER — Inpatient Hospital Stay (HOSPITAL_COMMUNITY): Payer: Medicare HMO | Admitting: Speech Pathology

## 2019-03-09 NOTE — Progress Notes (Signed)
Occupational Therapy Session Note  Patient Details  Name: [name] MRN: [id number] Date of Birth: 09-18-1945  Today's Date: 03/09/2019 OT Individual Time: 9233-0076 OT Individual Time Calculation (min): 75 min    Short Term Goals: Week 1:  OT Short Term Goal 1 (Week 1): Pt will complete toileting tasks with 1 assist OT Short Term Goal 2 (Week 1): Pt will complete 2 self care tasks w/c level at sink to increase OOB tolerance OT Short Term Goal 3 (Week 1): Pt will complete LB dressing tasks with 1 assist at sit<stand level  Skilled Therapeutic Interventions/Progress Updates:    Pt received in bed and reviewed orientation. She did know she was in the hospital and cued pt to use calendar for A with current day as she was not sure. Pt needed a considerable amount of prompting to get started.  Pt sat to EOB with mod A to move from sidelying to sit.  Donned pants over feet with mod A and then did rise to stand with min A to RW. Pt then able to pull pants over hips. She wanted to sit back down right away.  Sat and talked for a considerable time.  Tried to have pt transfer to wc so we could practice shower transfers, but pt kept avoiding it and then stated her back hurt to much and she needed to lay back down.  Her husband arrived.  Pt agreeable to bed level exercises. LEs: bridges, resisted hip abduction and resisted leg extension using Level 2 theraband  UE:  B AROM of overhead reach and arm circles,  2# dowel bar chest presses, and resisted rows with band.  Pt resting in bed with all needs met.  Bed alarm set.   Therapy Documentation Precautions:  Precautions Precautions: Fall Precaution Comments: spinal cord stimulator Restrictions Weight Bearing Restrictions: No    Vital Signs: Therapy Vitals Temp: 98.1 F (36.7 C) Temp Source: Oral Pulse Rate: 86 Resp: 17 BP: 128/69 Patient Position (if appropriate): Lying Oxygen Therapy SpO2: 96 % O2 Device: Room Air Pain: Pain  Assessment Pain Scale: 0-10 Pain Score: 5  Pain Type: Acute pain Pain Location: Buttocks Pain Orientation: Lower Pain Descriptors / Indicators: Discomfort Pain Frequency: Rarely Pain Onset: On-going Patients Stated Pain Goal: 1 Pain Intervention(s): Medication (See eMAR) ADL: ADL Eating: Not assessed Grooming: Minimal assistance Where Assessed-Grooming: Bed level Upper Body Bathing: Supervision/safety Where Assessed-Upper Body Bathing: Edge of bed Lower Body Bathing: Other (comment)(2 helpers) Where Assessed-Lower Body Bathing: Edge of bed Upper Body Dressing: Moderate assistance Where Assessed-Upper Body Dressing: Other (Comment)(sit<stand from 3:1) Lower Body Dressing: Other (Comment)(+2 assist) Where Assessed-Lower Body Dressing: Other (Comment)(sit<stand from 3:1) Toileting: Maximal assistance Where Assessed-Toileting: Bedside Commode Toilet Transfer: Moderate assistance Toilet Transfer Method: Stand pivot Toilet Transfer Equipment: Engineer, technical sales Transfer: Not assessed   Therapy/Group: Individual Therapy  [place] 03/09/2019, 9:05 AM

## 2019-03-09 NOTE — Progress Notes (Signed)
Physical Therapy Session Note  Patient Details  Name: [name] MRN: [id number] Date of Birth: [birth year]  Today's Date: 03/09/2019 PT Individual Time: [id number] PT Individual Time Calculation (min): 81 min   Short Term Goals: Week 1:  PT Short Term Goal 1 (Week 1): Pt will perform least restrictive transfer with mod A consistently PT Short Term Goal 2 (Week 1): Pt will initiate w/c mobility PT Short Term Goal 3 (Week 1): Pt will initiate standing as safe and able  Skilled Therapeutic Interventions/Progress Updates:   Pt received supine in bed with her husband, Merry Proud, present and pt agreeable to therapy session. Reports needing to use bathroom. Supine>sit with min assist for trunk upright. Stand pivot EOB>w/c using RW with min assist for lifting into standing and CGA for steadying while turning - max cuing for sequencing and AD management. Transported in/out of bathroom using w/c. Stand pivot w/c<>BSC over toilet using grab bar with min assist for lifting and CGA for steadying balance - mod assist for LB clothing management - continent of bladder and performed seated peri-care without assist. Transported to/from gym in w/c. Gait training 91ft using RW w/c>EOM with min assist for balance - demonstrates slow gait speed with decreased B LE step length - pt appears to be hesitant during this task. Repeated sit<>stands EOM<>B HHA 2 sets of 3 reps with pt unable to come to a full stand on the 3rd rep each time due to B LE fatigue. Seated B LE long arc quads with 5lb ankle weight 2x10 reps each with cuing for full knee extension. Attempted standing alternate B LE foot taps on 4" step with B UE support on RW but pt touched each foot to the step then returned to sitting on mat stating it made her dizzy when looking down at the ground.  Upon further questioning, pt reports she has a history of "positional vertigo"  and that she still gets very nauseous and dizzy when "rolling in the bed."   Pt  agreeable to further vestibular assessment: Performed chart review and saw "Cranial CT scan no focal acute intracranial abnormality.Marland KitchenMarland KitchenMarland KitchenCarotid ultrasound with no significant stenosis...CT cervical spine no acute abnormality or fracture.Marland KitchenMarland KitchenNegative CTA of head." Performed cervical AROM and pt denies any pain with movement and has WFL ROM.  Performed R and L DixHallpike test with pt reporting dizziness and nauseous with R test but no nystagmus noted - due to only 1 person assist unsure if able to have pt move in a smooth and consistent movement to elicit nystagmus response. Retested with +2 assist to assist with trunk descent; however, had same results as above. Tested R and L horizontal canal with pt reporting dizziness/nausea with R side but no nystagmus noted either side.  Smooth pursuit: normal  Saccades: normal Reports onset of a headache type pain during the visual tests (pt with known history of migraines) and upon further questioning pt does say her vertigo frequently occurs with migraines though does state she still often has it when rolling in the bed even without a migraine - once back in the room pt's husband confirms this information. No repositioning maneuver performed today due to pt being nauseous after all of the testing.   Gait training 44ft back to w/c using RW with min assist for balance. Transported back to room and pt requesting to return to bed and take off pants. Stand pivot w/c>EOB using bedrail with min assist - mod assist for LB clothing management. Sit>supine with min assist for B  LE management. Pt left supine in bed with needs in reach, bed alarm on, and her husband present.   Therapy Documentation Precautions:  Precautions Precautions: Fall Precaution Comments: spinal cord stimulator Restrictions Weight Bearing Restrictions: No  Pain:   Reports some headache pain with vestibular visual assessment - dissipates with rest break.    Therapy/Group: Individual  Therapy  Tawana Scale, PT, DPT 03/09/2019, 4:17 PM

## 2019-03-09 NOTE — Progress Notes (Signed)
Speech Language Pathology Daily Session Note  Patient Details  Name: [name] MRN: [id number] Date of Birth: 11/02/1945  Today's Date: 03/09/2019 SLP Individual Time: 1045-1130 SLP Individual Time Calculation (min): 45 min  Short Term Goals: Week 1: SLP Short Term Goal 1 (Week 1): Pt will consume dys 3 textures and thin liquids with mod I use of swallowing precautions and minimal overt s/s of aspiration. SLP Short Term Goal 2 (Week 1): Pt will consume therapeutic trials of regular textures with supervision cues for use of swallowing precautions and minimal overt s/s of aspiration over 3 consecutive sessions prior to advancement. SLP Short Term Goal 3 (Week 1): Pt will sustain her attention to basic familiar tasks for 10 minute intervals with min verbal cues for redirection. SLP Short Term Goal 4 (Week 1): Pt will complete basic, familiar tasks with min verbal cues for functional problem solving. SLP Short Term Goal 5 (Week 1): Pt will use external aids to recall daily information with min verbal cues.  Skilled Therapeutic Interventions: Patient received skilled SLP services targeting cognitive goals. Patient participated in a medication management task filling a BID pill box with 100% accuracy independently. Patient did require mod verbal cues to redirect to medication management task. Patient participated in an attention task sorting a deck of cards by number and color with mod verbal cues needed for redirection to task. With mod verbal cues patient implemented use of calendar to recall month/year/day of the week. Patient's mentation appears to continue to fluctuate with intermittent confusion noted in conversation. At the end of therapy session patient was upright in bed, bed alarm activated, and spouse present.  Pain Pain Assessment Pain Scale: Faces Faces Pain Scale: No hurt  Therapy/Group: Individual Therapy  Cristy Folks 03/09/2019, 11:32 AM

## 2019-03-09 NOTE — Progress Notes (Signed)
[facility] PHYSICAL MEDICINE & REHABILITATION PROGRESS NOTE   Subjective/Complaints: Patient seen sitting up in bed this morning.  She states she slept well overnight, states she just finished her breakfast.  No reported issues overnight.  ROS: Denies CP, SOB, N/V/D  Objective:   DG Abd 1 View  Result Date: 03/08/2019 CLINICAL DATA:  Fecal incontinence. Rectal pain. EXAM: ABDOMEN - 1 VIEW COMPARISON:  03/04/2019 FINDINGS: The bowel gas pattern is unremarkable. No findings for obstruction or perforation or fecal impaction. Stable spinal cord stimulator. Right upper quadrant surgical clips from prior cholecystectomy. No worrisome calcifications. The bony structures are unremarkable. IMPRESSION: Unremarkable abdominal radiograph. Electronically Signed   By: Marijo Sanes M.D.   On: 03/08/2019 10:55   Recent Labs    03/08/19 0456  WBC 7.3  HGB 10.4*  HCT 34.1*  PLT 384   Recent Labs    03/08/19 0456  NA 145  K 4.0  CL 102  CO2 30  GLUCOSE 113*  BUN 5*  CREATININE 0.53  CALCIUM 10.2    Intake/Output Summary (Last 24 hours) at 03/09/2019 [id number] Last data filed at 03/09/2019 0824 Gross per 24 hour  Intake 340 ml  Output --  Net 340 ml     Physical Exam: Vital Signs Blood pressure 128/69, pulse 86, temperature 98.1 F (36.7 C), temperature source Oral, resp. rate 17, height 5\' 1"  (1.549 m), weight 75.8 kg, SpO2 96 %. Constitutional: No distress . Vital signs reviewed. HENT: Normocephalic.  Atraumatic. Eyes: EOMI. No discharge. Cardiovascular: No JVD. Respiratory: Normal effort.  No stridor. GI: Non-distended. Skin: Warm and dry.  Intact. Psych: Normal mood.  Normal behavior. Musc: No edema in extremities.  No tenderness in extremities. Neuro:Alert and oriented x1 Motor: Bilateral upper extremities: 4/5 proximal distal, unchanged Bilateral lower extremities: Hip flexion 4 -/5, distally 4/5  Assessment/Plan: 1. Functional deficits secondary to Altered mental status  secondary to acute metabolic encephalopathy and recent admission [facility] healthcare for COVID-19 02/14/2019 to 02/26/2019  which require 3+ hours per day of interdisciplinary therapy in a comprehensive inpatient rehab setting.  Physiatrist is providing close team supervision and 24 hour management of active medical problems listed below.  Physiatrist and rehab team continue to assess barriers to discharge/monitor patient progress toward functional and medical goals  Care Tool:  Bathing    Body parts bathed by patient: Right arm, Left arm, Chest, Abdomen, Front perineal area, Right upper leg, Left upper leg, Face   Body parts bathed by helper: Buttocks, Right lower leg, Left lower leg     Bathing assist Assist Level: Maximal Assistance - Patient 24 - 49%     Upper Body Dressing/Undressing Upper body dressing   What is the patient wearing?: Pull over shirt    Upper body assist Assist Level: Minimal Assistance - Patient > 75%    Lower Body Dressing/Undressing Lower body dressing      What is the patient wearing?: Incontinence brief, Pants     Lower body assist Assist for lower body dressing: Maximal Assistance - Patient 25 - 49%     Toileting Toileting    Toileting assist Assist for toileting: Maximal Assistance - Patient 25 - 49%     Transfers Chair/bed transfer  Transfers assist     Chair/bed transfer assist level: Moderate Assistance - Patient 50 - 74%     Locomotion Ambulation   Ambulation assist   Ambulation activity did not occur: Safety/medical concerns          Walk 10  feet activity   Assist  Walk 10 feet activity did not occur: Safety/medical concerns        Walk 50 feet activity   Assist Walk 50 feet with 2 turns activity did not occur: Safety/medical concerns         Walk 150 feet activity   Assist Walk 150 feet activity did not occur: Safety/medical concerns         Walk 10 feet on uneven surface  activity   Assist Walk 10  feet on uneven surfaces activity did not occur: Safety/medical concerns         Wheelchair     Assist Will patient use wheelchair at discharge?: Yes Type of Wheelchair: Manual    Wheelchair assist level: Dependent - Patient 0% Max wheelchair distance: 150'    Wheelchair 50 feet with 2 turns activity    Assist        Assist Level: Dependent - Patient 0%   Wheelchair 150 feet activity     Assist      Assist Level: Dependent - Patient 0%    Medical Problem List and Plan:  1.  Altered mental status secondary to acute metabolic encephalopathy and recent admission [facility] healthcare for COVID-19 02/14/2019 to 02/26/2019   Continue CIR 2.  Antithrombotics: -DVT/anticoagulation: Lovenox             -antiplatelet therapy: Aspirin 81 mg daily 3. Pain Management/chronic back pain with spinal cord stimulator implant:   Patient on Neurontin 600 mg twice daily as well as oxycodone 5 mg every 6 hours as needed prior to admission, on hold due to altered mental status.             Kpad for lower back pain for comfort.   Appears controlled on 2/9 4. Mood: Celexa 20 mg daily, melatonin 5 mg nightly             -antipsychotic agents: Seroquel 12.5 mg nightly 5. Neuropsych: This patient is not capable of making decisions on her own behalf. 6. Skin/Wound Care: Routine skin checks.  Reported stage I sacral decubiti on admission to [facility] 7. Fluids/Electrolytes/Nutrition: Routine in and outs 8.  Hypertension.  Tenormin 50 mg daily.    Controlled on 2/9  Monitor with increased mobility 9.  History of asthma.  Continue inhalers, Singulair 10 mg nightly.  Check oxygen saturations every shift 10.  Thrush with poor appetite.  Completed course of Diflucan.  Dietary follow-up 11.  Enlargement of right thyroid with coarse calcification.  Follow-up outpatient thyroid ultrasound.  TSH slightly low and started on low-dose Tapazole. 12.  Fever with leukocytosis: Resolved 13.  Bowel incontinence:    KUB personally reviewed, unremarkable  C. difficile negative  Continue meds for now 14. Significant Nausea  Improved 15.  Dysphagia  D3 thins, advance as tolerated  Variable p.o. intake, eating 0/60% of meals 16.  Hypoalbuminemia  Supplement initiated 17.  Anemia  Hemoglobin 10.4 on 2/8  Continue to monitor  LOS: 4 days A FACE TO FACE EVALUATION WAS PERFORMED  Devanshi Califf [name] 03/09/2019, 9:07 AM

## 2019-03-09 NOTE — Plan of Care (Signed)
  Problem: Sit to Stand Goal: LTG:  Patient will perform sit to stand with assistance level (PT) Description: LTG:  Patient will perform sit to stand with assistance level (PT) Flowsheets (Taken 03/09/2019 1838) LTG: PT will perform sit to stand in preparation for functional mobility with assistance level: (upgraded based on pt progress) Supervision/Verbal cueing Note: upgraded based on pt progress    Problem: RH Bed to Chair Transfers Goal: LTG Patient will perform bed/chair transfers w/assist (PT) Description: LTG: Patient will perform bed to chair transfers with assistance (PT). Flowsheets (Taken 03/09/2019 1838) LTG: Pt will perform Bed to Chair Transfers with assistance level: (upgraded based on pt progress) Supervision/Verbal cueing Note: upgraded based on pt progress    Problem: RH Car Transfers Goal: LTG Patient will perform car transfers with assist (PT) Description: LTG: Patient will perform car transfers with assistance (PT). Flowsheets (Taken 03/09/2019 1838) LTG: Pt will perform car transfers with assist:: (upgraded based on pt progress) Contact Guard/Touching assist Note: upgraded based on pt progress    Problem: RH Ambulation Goal: LTG Patient will ambulate in controlled environment (PT) Description: LTG: Patient will ambulate in a controlled environment, # of feet with assistance (PT). Flowsheets (Taken 03/09/2019 1838) LTG: Pt will ambulate in controlled environ  assist needed:: (added goal based on pt progress) Contact Guard/Touching assist LTG: Ambulation distance in controlled environment: 122ft using LRAD Note: added goal based on pt progress  Goal: LTG Patient will ambulate in home environment (PT) Description: LTG: Patient will ambulate in home environment, # of feet with assistance (PT). Flowsheets (Taken 03/09/2019 1838) LTG: Pt will ambulate in home environ  assist needed:: Contact Guard/Touching assist LTG: Ambulation distance in home environment: 47ft using  LRAD Note: added goal based on pt progress

## 2019-03-10 ENCOUNTER — Inpatient Hospital Stay (HOSPITAL_COMMUNITY): Payer: Medicare HMO | Admitting: Speech Pathology

## 2019-03-10 ENCOUNTER — Inpatient Hospital Stay (HOSPITAL_COMMUNITY): Payer: Medicare HMO | Admitting: Occupational Therapy

## 2019-03-10 ENCOUNTER — Inpatient Hospital Stay (HOSPITAL_COMMUNITY): Payer: Medicare HMO | Admitting: Physical Therapy

## 2019-03-10 DIAGNOSIS — M961 Postlaminectomy syndrome, not elsewhere classified: Secondary | ICD-10-CM
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
## 2019-03-10 NOTE — Progress Notes (Signed)
[place] PHYSICAL MEDICINE & REHABILITATION PROGRESS NOTE   Subjective/Complaints: Patient seen sitting up in bed this morning.  She states she did not sleep well overnight because if she received room sleeping medications late.  Discussed changing timing of sleep meds with nursing.  Patient has questions regarding importance of timing of her other medications as well.  She is looking forward to finding out her discharge date today.  ROS: Denies CP, SOB, N/V/D  Objective:   DG Abd 1 View  Result Date: 03/08/2019 CLINICAL DATA:  Fecal incontinence. Rectal pain. EXAM: ABDOMEN - 1 VIEW COMPARISON:  03/04/2019 FINDINGS: The bowel gas pattern is unremarkable. No findings for obstruction or perforation or fecal impaction. Stable spinal cord stimulator. Right upper quadrant surgical clips from prior cholecystectomy. No worrisome calcifications. The bony structures are unremarkable. IMPRESSION: Unremarkable abdominal radiograph. Electronically Signed   By: Marijo Sanes M.D.   On: 03/08/2019 10:55   Recent Labs    03/08/19 0456  WBC 7.3  HGB 10.4*  HCT 34.1*  PLT 384   Recent Labs    03/08/19 0456  NA 145  K 4.0  CL 102  CO2 30  GLUCOSE 113*  BUN 5*  CREATININE 0.53  CALCIUM 10.2    Intake/Output Summary (Last 24 hours) at 03/10/2019 0913 Last data filed at 03/10/2019 0818 Gross per 24 hour  Intake 720 ml  Output --  Net 720 ml     Physical Exam: Vital Signs Blood pressure (!) 157/86, pulse 99, temperature 98.3 F (36.8 C), temperature source Oral, resp. rate 17, height 5\' 1"  (1.549 m), weight 75.8 kg, SpO2 96 %. Constitutional: No distress . Vital signs reviewed. HENT: Normocephalic.  Atraumatic. Eyes: EOMI. No discharge. Cardiovascular: No JVD. Respiratory: Normal effort.  No stridor. GI: Non-distended. Skin: Warm and dry.  Intact. Psych: Normal mood.  Normal behavior. Musc: No edema in extremities.  No tenderness in extremities. Neuro:Alert and oriented to time,  but not place Motor: Bilateral upper extremities: 4/5 proximal distal, unchanged Bilateral lower extremities: Hip flexion 4/5, distally 4/5  Assessment/Plan: 1. Functional deficits secondary to Altered mental status secondary to acute metabolic encephalopathy and recent admission [facility] healthcare for COVID-19 02/14/2019 to 02/26/2019  which require 3+ hours per day of interdisciplinary therapy in a comprehensive inpatient rehab setting.  Physiatrist is providing close team supervision and 24 hour management of active medical problems listed below.  Physiatrist and rehab team continue to assess barriers to discharge/monitor patient progress toward functional and medical goals  Care Tool:  Bathing    Body parts bathed by patient: Right arm, Left arm, Chest, Abdomen, Front perineal area, Right upper leg, Left upper leg, Face   Body parts bathed by helper: Buttocks, Right lower leg, Left lower leg     Bathing assist Assist Level: Maximal Assistance - Patient 24 - 49%     Upper Body Dressing/Undressing Upper body dressing   What is the patient wearing?: Pull over shirt    Upper body assist Assist Level: Minimal Assistance - Patient > 75%    Lower Body Dressing/Undressing Lower body dressing      What is the patient wearing?: Incontinence brief, Pants     Lower body assist Assist for lower body dressing: Maximal Assistance - Patient 25 - 49%     Toileting Toileting    Toileting assist Assist for toileting: Maximal Assistance - Patient 25 - 49%     Transfers Chair/bed transfer  Transfers assist     Chair/bed transfer assist  level: Minimal Assistance - Patient > 75%     Locomotion Ambulation   Ambulation assist   Ambulation activity did not occur: Safety/medical concerns  Assist level: Minimal Assistance - Patient > 75% Assistive device: Walker-rolling Max distance: 51ft   Walk 10 feet activity   Assist  Walk 10 feet activity did not occur: Safety/medical  concerns        Walk 50 feet activity   Assist Walk 50 feet with 2 turns activity did not occur: Safety/medical concerns         Walk 150 feet activity   Assist Walk 150 feet activity did not occur: Safety/medical concerns         Walk 10 feet on uneven surface  activity   Assist Walk 10 feet on uneven surfaces activity did not occur: Safety/medical concerns         Wheelchair     Assist Will patient use wheelchair at discharge?: Yes Type of Wheelchair: Manual    Wheelchair assist level: Dependent - Patient 0% Max wheelchair distance: 150'    Wheelchair 50 feet with 2 turns activity    Assist        Assist Level: Dependent - Patient 0%   Wheelchair 150 feet activity     Assist      Assist Level: Dependent - Patient 0%    Medical Problem List and Plan:  1.  Altered mental status secondary to acute metabolic encephalopathy and recent admission [facility] healthcare for COVID-19 02/14/2019 to 02/26/2019   Continue CIR  Team conference today to discuss current and goals and coordination of care, home and environmental barriers, and discharge planning with nursing, case manager, and therapies.  2.  Antithrombotics: -DVT/anticoagulation: Lovenox             -antiplatelet therapy: Aspirin 81 mg daily 3. Pain Management/chronic back pain with spinal cord stimulator implant:   Patient on Neurontin 600 mg twice daily as well as oxycodone 5 mg every 6 hours as needed prior to admission, on hold due to altered mental status.             Kpad for lower back pain for comfort.   Appears controlled on 8/10 4. Mood: Celexa 20 mg daily, melatonin 5 mg nightly             -antipsychotic agents: Seroquel 12.5 mg nightly 5. Neuropsych: This patient is not capable of making decisions on her own behalf. 6. Skin/Wound Care: Routine skin checks.  Reported stage I sacral decubiti on admission to [facility] 7. Fluids/Electrolytes/Nutrition: Routine in and outs 8.   Hypertension.  Tenormin 50 mg daily.    Had been controlled, however labile over the last 24 hours, monitor for trend  Monitor with increased mobility 9.  History of asthma.  Continue inhalers, Singulair 10 mg nightly.  Check oxygen saturations every shift 10.  Thrush with poor appetite.  Completed course of Diflucan.  Dietary follow-up 11.  Enlargement of right thyroid with coarse calcification.  Follow-up outpatient thyroid ultrasound.  TSH slightly low and started on low-dose Tapazole. 12.  Fever with leukocytosis: Resolved 13.  Bowel incontinence:   KUB personally reviewed, unremarkable  C. difficile negative  Appears to be improving 14. Significant Nausea  Improved 15.  Dysphagia  D3 thins, advance as tolerated  Variable p.o. intake, eating 25-75% of meals 16.  Hypoalbuminemia  Supplement initiated 17.  Anemia  Hemoglobin 10.4 on 2/8  Continue to monitor  LOS: 5 days A FACE TO FACE  EVALUATION WAS PERFORMED  [name] [name] 03/10/2019, 9:13 AM

## 2019-03-10 NOTE — Progress Notes (Signed)
Speech Language Pathology Daily Session Note  Patient Details  Name: [name] MRN: [id number] Date of Birth: 1945/10/13  Today's Date: 03/10/2019 SLP Individual Time: [id number] SLP Individual Time Calculation (min): 48 min  Short Term Goals: Week 1: SLP Short Term Goal 1 (Week 1): Pt will consume dys 3 textures and thin liquids with mod I use of swallowing precautions and minimal overt s/s of aspiration. SLP Short Term Goal 2 (Week 1): Pt will consume therapeutic trials of regular textures with supervision cues for use of swallowing precautions and minimal overt s/s of aspiration over 3 consecutive sessions prior to advancement. SLP Short Term Goal 3 (Week 1): Pt will sustain her attention to basic familiar tasks for 10 minute intervals with min verbal cues for redirection. SLP Short Term Goal 4 (Week 1): Pt will complete basic, familiar tasks with min verbal cues for functional problem solving. SLP Short Term Goal 5 (Week 1): Pt will use external aids to recall daily information with min verbal cues.  Skilled Therapeutic Interventions: Pt was seen for skilled ST intervention targeting aforementioned goals. Pt was pleasant and cooperative with unfamiliar therapist. SLP facilitated session by engaging pt in conversation regarding rationale for CIR stay and focus of therapies to maximize awareness of deficits. Pt exhibited good verbal problem solving regarding repositioning herself in her chair. She reports her goal is to return home, and wants to be safe once she is back at home. Pt reports she has managed finances at home, and feels this would be an appropriate therapy activity to address. Pt demonstrates difficulty with orientation to time, immediate and delayed recall, thought organization, mental math, and clock drawing. She maintained good sustain attention to task in a quiet environment, but had difficulty with selective attention (distracted from task when nurse tech arrived to take  vital signs - required cues to redirect).   Pt was left in wheelchair with alarm on, all needs within reach. Husband present. Continue ST per current plan of care.   Pain Pain Assessment Pain Scale: Faces Faces Pain Scale: Hurts a little bit Pain Type: Chronic pain Pain Location: Back  Therapy/Group: Individual Therapy   [name] B. [name], [facility], CCC-SLP Speech Language Pathologist  [name] 03/10/2019, 3:52 PM

## 2019-03-10 NOTE — Progress Notes (Signed)
Social Work Patient ID: [name], female   DOB: 07-29-1945, 74 y.o.   MRN: [id number]    SW met with pt and pt husband to provide update from rehab team, and inform on anticipated d/c date 03/20/19. SW also reiterated team recommendations for 24/7care due to cognition for greater than 3 months at this time. Pt husband intends to explore if there are any natural supports available.   Loralee Pacas, MSW, [place] Office (preferred): [phone] Cell: [phone] Fax: [phone]

## 2019-03-10 NOTE — Progress Notes (Signed)
Physical Therapy Session Note  Patient Details  Name: [name] MRN: [id number] Date of Birth: 04-15-1945  Today's Date: 03/10/2019 PT Individual Time: [id number] PT Individual Time Calculation (min): 45 min   Short Term Goals: Week 1:  PT Short Term Goal 1 (Week 1): Pt will perform least restrictive transfer with mod A consistently PT Short Term Goal 2 (Week 1): Pt will initiate w/c mobility PT Short Term Goal 3 (Week 1): Pt will initiate standing as safe and able  Skilled Therapeutic Interventions/Progress Updates: Pt presented in w/c with husband present agreeable to therapy. Pt stating some "pulling" in back but per pt no intervention necessary. Pt transported to day room and participated in Cybex Kinetron x 4 min for general conditioning. Pt initially at 70cm/sec but increased up to 40cm/sec per pt request. Pt then transported to high/low mat and performed stand pivot transfer to mat with CGA. Pt participated in ankle pumps, LAQ to fatigue (approx 15-20 b/l) Pt ambulated 1ft with RW and CGA with pt performing uncontrolled descent into w/c once turned around. Per pt felt like R knee was "giving out" but no c/o increased pain. After extended therapeutic break pt ambulated approx another 81ft then stating increased dizziness which subsided once rested in w/c. Pt states dizziness contributed to poor sleep previous night. Pt transported back to room and requesting to use bathroom. Performed stand pivot transfer from w/c to toilet with use of wall rail CGA. Pt left at toilet at end of session with husband present and noted verbal understanding that pt to pull cal light once completed. Pt's nurse Benjie Karvonen advised of pt's disposition.      Therapy Documentation Precautions:  Precautions Precautions: Fall Precaution Comments: spinal cord stimulator Restrictions Weight Bearing Restrictions: No     Therapy/Group: Individual Therapy  Jakiyah Stepney  Jarelle Ates, PTA  03/10/2019,  4:15 PM

## 2019-03-10 NOTE — Progress Notes (Signed)
Occupational Therapy Session Note  Patient Details  Name: [name] MRN: [id number] Date of Birth: Jun 24, 1945  Today's Date: 03/10/2019 OT Individual Time: 4888-9169 and 1130-1200 OT Individual Time Calculation (min): 75 min and 30 min   Short Term Goals: Week 1:  OT Short Term Goal 1 (Week 1): Pt will complete toileting tasks with 1 assist OT Short Term Goal 2 (Week 1): Pt will complete 2 self care tasks w/c level at sink to increase OOB tolerance OT Short Term Goal 3 (Week 1): Pt will complete LB dressing tasks with 1 assist at sit<stand level  Skilled Therapeutic Interventions/Progress Updates:    Visit 1: no c/o pain Pt received in bed and agreeable to therapy. Pt asking about the team conference and recalled that we would set a d/c date today.  It some encouragement to get pt out of bed, but she did need to toilet.  Completed sit to stand from bed to wc stand pivot all with min A.  W/c to toilet with bar with min A. Pt able to self cleanse.  Pt declined a shower stating that she did not want to shower here, but agreeable to a sponge bathe.  She was able to cross legs to doff socks, wash feet and don socks.  Sit to stand at sink several times with min A.  At one point pt was dizzy and short of breath. Sat to rest and then she felt better.  Pt set up in wc and then she decided to get back in bed.  Continues to need cues to push up to stand with B hands and then reach for RW. When she does that she can frequently rise to stand with CGA.  Transferred to bed by taking 5 steps with RW to sit EOB with CGA then moved into supine with min A.  Pt adjusted in bed and bed alarm set with all needs met.  Visit 2: no c/o pain Pt's spouse present and very helpful and involved during the session. Pt sat to EOB and worked on sit to stand technique to [employer], took 8 steps to arm chair. From arm chair worked on UB back exercises with rows and B arm abd using orange theraband.  Pt did want to toilet before  lunch.  Used RW to walk 10 feet into bathroom with min A fading to mod A due to increasing weakness in legs. Completed toileting with min A.  Due to increased weakness, completed stand pivot toilet to wc with min A and use of bar.  Pt set up in wc to prepare for lunch with belt alarm on.    Therapy Documentation Precautions:  Precautions Precautions: Fall Precaution Comments: spinal cord stimulator Restrictions Weight Bearing Restrictions: No  Pain: Pain Assessment Pain Scale: 0-10 Pain Score: 0-No pain ADL: ADL Eating: Not assessed Grooming: Setup Where Assessed-Grooming: Sitting at sink Upper Body Bathing: Supervision/safety Where Assessed-Upper Body Bathing: Sitting at sink Lower Body Bathing: Minimal assistance Where Assessed-Lower Body Bathing: Sitting at sink Upper Body Dressing: Setup Where Assessed-Upper Body Dressing: Wheelchair Lower Body Dressing: Moderate assistance Where Assessed-Lower Body Dressing: Wheelchair Toileting: Contact guard Where Assessed-Toileting: Glass blower/designer: Therapist, music Method: Arts development officer: Energy manager: Not assessed  Therapy/Group: Individual Therapy  [place] 03/10/2019, 11:31 AM

## 2019-03-11 ENCOUNTER — Inpatient Hospital Stay (HOSPITAL_COMMUNITY): Payer: Medicare HMO | Admitting: Physical Therapy

## 2019-03-11 ENCOUNTER — Inpatient Hospital Stay (HOSPITAL_COMMUNITY): Payer: Medicare HMO | Admitting: Speech Pathology

## 2019-03-11 ENCOUNTER — Inpatient Hospital Stay (HOSPITAL_COMMUNITY): Payer: Medicare HMO | Admitting: Occupational Therapy

## 2019-03-11 MED ORDER — ENSURE ENLIVE PO LIQD
237.0000 mL | Freq: Two times a day (BID) | ORAL | Status: DC
  Administered 2019-03-11 – 2019-03-16 (×3): 237 mL via ORAL

## 2019-03-11 NOTE — Progress Notes (Signed)
Physical Therapy Session Note  Patient Details  Name: [name] MRN: [id number] Date of Birth: 10-14-1945  Today's Date: 03/11/2019 PT Individual Time: 1700-1730 PT Individual Time Calculation (min): 30 min   Short Term Goals: Week 1:  PT Short Term Goal 1 (Week 1): Pt will perform least restrictive transfer with mod A consistently PT Short Term Goal 2 (Week 1): Pt will initiate w/c mobility PT Short Term Goal 3 (Week 1): Pt will initiate standing as safe and able  Skilled Therapeutic Interventions/Progress Updates:   Pt received supine in bed and agreeable to PT. Supine>sit transfer with min assist and min cue sfor sequencing and use of RUE to push in to sitting. Sit<>stand from EOB with min assist. Pt performed gait training with RW x 74f and min assist from PT for safety. Pt noted to have mild Trendelenburg gait pattern R>L in BLE..Marland Kitchendynamic standing balance instructed by PT to perform lateral reach to the R and toss horse shoe to target, pt self self selected to stand in semitandem position to toss horseshoe 8 on the R and 8 on the R with no LOB noted and 1 UE support on RW; min assist from PT for safety and cues for ankle strategy to correct any lateral leans. [facility] returned to [facility] and [facility] and all needs met.         Therapy Documentation Precautions:  Precautions Precautions: Fall Precaution Comments: spinal cord stimulator Restrictions Weight Bearing Restrictions: No Vital Signs: Therapy Vitals Temp: 98.2 F (36.8 C) Pulse Rate: 84 Resp: 16 BP: (!) 147/70 Patient Position (if appropriate): Sitting Oxygen Therapy SpO2: 97 % O2 Device: Room Air Pain:   denies  Therapy/Group: Individual Therapy  ALorie Phenix2/11/2019, 5:45 PM

## 2019-03-11 NOTE — Progress Notes (Signed)
Physical Therapy Session Note  Patient Details  Name: [name] MRN: [id number] Date of Birth: Jun 09, 1945  Today's Date: 03/11/2019 PT Individual Time: 1004-1102 PT Individual Time Calculation (min): 58 min   Short Term Goals: Week 1:  PT Short Term Goal 1 (Week 1): Pt will perform least restrictive transfer with mod A consistently PT Short Term Goal 2 (Week 1): Pt will initiate w/c mobility PT Short Term Goal 3 (Week 1): Pt will initiate standing as safe and able  Skilled Therapeutic Interventions/Progress Updates:    Pt received L sidelying in bed reporting she was feeling a little nauseous and tired but agreeable to therapy session. Supine>sit, HOB partially elevated and using bedrail, with min assist for trunk upright. Reported onset of dizziness while sitting EOB; vitals assessed BP 133/69 (MAP 89), HR 78bpm. Once symptoms dissipated performed stand pivot transfer to w/c with min assist for lifting into stance and balance. Pt reports need to use bathroom.  Transported in/out of bathroom in w/c. Stand pivot w/c<>BSC over toilet using grab bar support with min assist for lifting and balance. Standing with CGA performed LB clothing management without assistance. Pt continent of urine and performed seated peri-care without assistance. Pt reports headache along top and back of her head - with encouragement pt agreeable to go to therapy gym.  Transported to/from gym in w/c. Stand pivot w/c<>Nustep, no AD, with min assist for lifting into standing and balance while turning. Performed B LE strengthening and activity tolerance on NuStep, not using UEs, against level 1 resistance (pt unable to perform task against any more resistance) for 2 minutes x2 (seated break between) totaling 45steps and 56 steps. Gait training ~60ft w/c>EOM using RW with min assist for balance. Sit<>supine on mat with min assist for B LE management onto mat and for trunk upright coming to sit. Performed supine B LE  strengthening exercise of hip flexion/heel slides x10 reps each LE; attempted bridging but pt reported this caused a pulling pain on L side of her low back and deferred further repetitions. Gait ~79ft EOM>w/c using RW with min assist for balance. Transported to main therapy gym in w/c. Sitting>tall kneeling on mat with B UE support on bench and mod assist for balance and placing LEs onto mat - pt reports this caused pain/discomfort in her R knee and deferred staying in this position - returned to sitting in w/c with mod assist. Stand pivot w/c<>EOM, no AD, with min assist for lifting and balance while turning. Standing with B UE support on RW performed alternate B LE foot taps on 4" step with pt able to perform 8reps then 12 reps - seated break between. Transported back to room and pt requesting to return to bed and rest. Stand pivot w/c>EOB using armrests and bedrail with min assist - pt demonstrating B LE fatigue with increased difficulty coming to stand. Sit>supine with CGA for LE management. Pt left supine in bed with needs in reach and bed alarm on.  Therapy Documentation Precautions:  Precautions Precautions: Fall Precaution Comments: spinal cord stimulator Restrictions Weight Bearing Restrictions: No  Pain:   Reports various pains during session - details above - therapist modified treatment interventions for pain management.   Therapy/Group: Individual Therapy  Tawana Scale, PT, DPT 03/11/2019, 7:54 AM

## 2019-03-11 NOTE — Progress Notes (Signed)
Nutrition Follow-up  DOCUMENTATION CODES:   Not applicable  INTERVENTION:   Continue Ensure Enlive po BID, each supplement provides 350 kcal and 20 grams of protein  Continue MVI daily  Continue 36ml Pro-stat BID  NUTRITION DIAGNOSIS:   Increased nutrient needs related to wound healing as evidenced by estimated needs.  Ongoing.   GOAL:   Patient will meet greater than or equal to 90% of their needs  Progressing.  MONITOR:   PO intake, Supplement acceptance, Weight trends, Labs, I & O's, Skin  REASON FOR ASSESSMENT:   Malnutrition Screening Tool    ASSESSMENT:   Patient with PMH significant for HTN, asthma, chronic back pain with spinal cord stimulator implant, and HLD. Recently admitted at [facility] for AMS and COVID-19 infection. Transferred to [facility] for inpatient rehab.  Discussed pt with RN who reports pt doing well with meals and snacks.  Pt reports appetite is good and denies wt loss PTA; reports her usual bodyweight is around 165 lbs. Pt reports she typically has oatmeal for breakfast, a pimiento cheese and bologna sandwich on whole wheat bread for lunch, and cheeseburgers for dinner. Pt requesting chocolate flavored Ensure only.   PO intake: 25-90% x last 8 recorded meals (57.5% average intake)  Medications reviewed and include: Vitamin D3, Ensure Enlive BID, 52ml Pro-stat BID, MVI with minerals, Vitamin B12, Senokot, Zinc sulfate  Labs reviewed.  NUTRITION - FOCUSED PHYSICAL EXAM:    Most Recent Value  Orbital Region  No depletion  Upper Arm Region  No depletion  Thoracic and Lumbar Region  No depletion  Buccal Region  No depletion  Temple Region  No depletion  Clavicle Bone Region  No depletion  Clavicle and Acromion Bone Region  No depletion  Scapular Bone Region  No depletion  Dorsal Hand  No depletion  Patellar Region  No depletion  Anterior Thigh Region  No depletion  Posterior Calf Region  No depletion  Edema (RD Assessment)  None  Hair  Reviewed   Eyes  Reviewed  Mouth  Reviewed  Skin  Reviewed  Nails  Reviewed       Diet Order:   Diet Order            DIET DYS 3 Room service appropriate? Yes with Assist; Fluid consistency: Thin  Diet effective now              EDUCATION NEEDS:   Not appropriate for education at this time  Skin:  Skin Assessment: Skin Integrity Issues: Skin Integrity Issues:: Stage I Stage I: sacrum  Last BM:  2/11  Height:   Ht Readings from Last 1 Encounters:  03/05/19 5\' 1"  (1.549 m)    Weight:   Wt Readings from Last 1 Encounters:  03/05/19 75.8 kg    Ideal Body Weight:  47.7 kg  BMI:  Body mass index is 31.59 kg/m.  Estimated Nutritional Needs:   Kcal:  1750-1950 kcal  Protein:  85-100 grams  Fluid:  >/= 1.7 L/day    Larkin Ina, MS, RD, LDN RD pager number and weekend/on-call pager number located in [place].

## 2019-03-11 NOTE — Progress Notes (Signed)
Occupational Therapy Session Note  Patient Details  Name: [name] MRN: [id number] Date of Birth: 1945/11/20  Today's Date: 03/11/2019 OT Individual Time: 0488-8916 OT Individual Time Calculation (min): 60 min    Short Term Goals: Week 1:  OT Short Term Goal 1 (Week 1): Pt will complete toileting tasks with 1 assist OT Short Term Goal 2 (Week 1): Pt will complete 2 self care tasks w/c level at sink to increase OOB tolerance OT Short Term Goal 3 (Week 1): Pt will complete LB dressing tasks with 1 assist at sit<stand level  Skilled Therapeutic Interventions/Progress Updates:    Pt received in w/c in a much more energetic mood than past days.  She reported that she used the [facility] this am and nursing helped her bathe her LB.    Pt sat at sink to complete UB self care with set up.  Pt opted to try her underwear vs brief today. She donned both underwear and pants with CGA only during standing.  Therapist assisted with TEDS and pt donned non slip socks.    Sat in w/c for UB strengthening with theraband focusing on back strengthening. Stood to RW with CGA and stepped to bed to sit down.  Min  A with lifting R leg into bed.  Pt adjusted in bed with all needs met. Improved mental clarity and focus today.  Bed alarm set.   Therapy Documentation Precautions:  Precautions Precautions: Fall Precaution Comments: spinal cord stimulator Restrictions Weight Bearing Restrictions: No    Vital Signs: Therapy Vitals Temp: 98.2 F (36.8 C) Pulse Rate: 87 Resp: 17 BP: (!) 148/70 Patient Position (if appropriate): Lying Oxygen Therapy SpO2: 100 % O2 Device: Room Air Pain: Pain Assessment Pain Score: 0-No pain    Therapy/Group: Individual Therapy  [place] 03/11/2019, 8:55 AM

## 2019-03-11 NOTE — Progress Notes (Signed)
Speech Language Pathology Daily Session Note  Patient Details  Name: KAILEIGH DUXBURY MRN: [id number] Date of Birth: [birth year]  Today's Date: 03/11/2019 SLP Individual Time: 1300-1400 SLP Individual Time Calculation (min): 60 min  Short Term Goals: Week 1: SLP Short Term Goal 1 (Week 1): Pt will consume dys 3 textures and thin liquids with mod I use of swallowing precautions and minimal overt s/s of aspiration. SLP Short Term Goal 2 (Week 1): Pt will consume therapeutic trials of regular textures with supervision cues for use of swallowing precautions and minimal overt s/s of aspiration over 3 consecutive sessions prior to advancement. SLP Short Term Goal 3 (Week 1): Pt will sustain her attention to basic familiar tasks for 10 minute intervals with min verbal cues for redirection. SLP Short Term Goal 4 (Week 1): Pt will complete basic, familiar tasks with min verbal cues for functional problem solving. SLP Short Term Goal 5 (Week 1): Pt will use external aids to recall daily information with min verbal cues.  Skilled Therapeutic Interventions:  Skilled treatment session targeted cognition goals. SLP facilitated session by providing chart review and written information related to different admissions, name of facilities, date pt admitted to each and pt's symptoms while at each facility. Pt unable to recall the above information and it clearly disturbed her.   Pt also had questions related to Effects of COVID and possible stroke. This Probation officer created written list of pt's deficits and goals for PT/OT/ST. Pt expressed relieff to have this information in written cohesive form to help her recall the events. Pt handed off to NT.   Pain    Therapy/Group: Individual Therapy  Ladeidra Borys 03/11/2019, 3:26 PM

## 2019-03-11 NOTE — Progress Notes (Signed)
[place] PHYSICAL MEDICINE & REHABILITATION PROGRESS NOTE   Subjective/Complaints: [name] has no complaints this morning. She denies pain, constipation. She finally slept well last night.  BP elevated this morning.  ROS: Denies CP, SOB, N/V/D  Objective:   No results found. No results for input(s): WBC, HGB, HCT, PLT in the last 72 hours. No results for input(s): NA, K, CL, CO2, GLUCOSE, BUN, CREATININE, CALCIUM in the last 72 hours.  Intake/Output Summary (Last 24 hours) at 03/11/2019 0956 Last data filed at 03/11/2019 0759 Gross per 24 hour  Intake 600 ml  Output --  Net 600 ml     Physical Exam: Vital Signs Blood pressure (!) 148/70, pulse 87, temperature 98.2 F (36.8 C), resp. rate 17, height 5\' 1"  (1.549 m), weight 75.8 kg, SpO2 100 %. Constitutional: No distress . Vital signs reviewed. Sitting up in chair.  HENT: Normocephalic.  Atraumatic. Eyes: EOMI. No discharge. Cardiovascular: No JVD. Respiratory: Normal effort.  No stridor. GI: Non-distended. Skin: Warm and dry.  Intact. Psych: Normal mood.  Normal behavior. Musc: No edema in extremities.  No tenderness in extremities. Neuro:Alert and oriented to time, but not place Motor: Bilateral upper extremities: 4/5 proximal distal, unchanged Bilateral lower extremities: Hip flexion 4+/5, distally 4+/5  Assessment/Plan: 1. Functional deficits secondary to Altered mental status secondary to acute metabolic encephalopathy and recent admission [facility] healthcare for COVID-19 02/14/2019 to 02/26/2019  which require 3+ hours per day of interdisciplinary therapy in a comprehensive inpatient rehab setting.  Physiatrist is providing close team supervision and 24 hour management of active medical problems listed below.  Physiatrist and rehab team continue to assess barriers to discharge/monitor patient progress toward functional and medical goals  Care Tool:  Bathing    Body parts bathed by patient: Right arm, Left  arm, Chest, Abdomen(UB only, pt reports nursing A with LB this am)   Body parts bathed by helper: Buttocks     Bathing assist Assist Level: Set up assist     Upper Body Dressing/Undressing Upper body dressing   What is the patient wearing?: Pull over shirt    Upper body assist Assist Level: Set up assist    Lower Body Dressing/Undressing Lower body dressing      What is the patient wearing?: Underwear/pull up, Pants     Lower body assist Assist for lower body dressing: Contact Guard/Touching assist     Toileting Toileting    Toileting assist Assist for toileting: Contact Guard/Touching assist     Transfers Chair/bed transfer  Transfers assist     Chair/bed transfer assist level: Contact Guard/Touching assist     Locomotion Ambulation   Ambulation assist   Ambulation activity did not occur: Safety/medical concerns  Assist level: Minimal Assistance - Patient > 75% Assistive device: Walker-rolling Max distance: 76ft   Walk 10 feet activity   Assist  Walk 10 feet activity did not occur: Safety/medical concerns        Walk 50 feet activity   Assist Walk 50 feet with 2 turns activity did not occur: Safety/medical concerns         Walk 150 feet activity   Assist Walk 150 feet activity did not occur: Safety/medical concerns         Walk 10 feet on uneven surface  activity   Assist Walk 10 feet on uneven surfaces activity did not occur: Safety/medical concerns         Wheelchair     Assist Will patient use wheelchair at discharge?:  Yes Type of Wheelchair: Manual    Wheelchair assist level: Dependent - Patient 0% Max wheelchair distance: 150'    Wheelchair 50 feet with 2 turns activity    Assist        Assist Level: Dependent - Patient 0%   Wheelchair 150 feet activity     Assist      Assist Level: Dependent - Patient 0%    Medical Problem List and Plan:  1.  Altered mental status secondary to acute  metabolic encephalopathy and recent admission [facility] healthcare for COVID-19 02/14/2019 to 02/26/2019   Continue CIR PT, OT, SLP  2.  Antithrombotics: -DVT/anticoagulation: Lovenox             -antiplatelet therapy: Aspirin 81 mg daily 3. Pain Management/chronic back pain with spinal cord stimulator implant:   Patient on Neurontin 600 mg twice daily as well as oxycodone 5 mg every 6 hours as needed prior to admission, on hold due to altered mental status.             Kpad for lower back pain for comfort.   2/11: well controlled.  4. Mood: Celexa 20 mg daily, melatonin 5 mg nightly             -antipsychotic agents: Seroquel 12.5 mg nightly 5. Neuropsych: This patient is not capable of making decisions on her own behalf. 6. Skin/Wound Care: Routine skin checks.  Reported stage I sacral decubiti on admission to [facility] 7. Fluids/Electrolytes/Nutrition: Routine in and outs 8.  Hypertension.  Tenormin 50 mg daily.    Had been controlled, however labile over the last 24 hours, monitor for trend  2/11: elevated this morning. I have personally reviewed flowsheet. Has been labile. Continue to monitor.   Monitor with increased mobility 9.  History of asthma.  Continue inhalers, Singulair 10 mg nightly.  Check oxygen saturations every shift 10.  Thrush with poor appetite.  Completed course of Diflucan.  Dietary follow-up 11.  Enlargement of right thyroid with coarse calcification.  Follow-up outpatient thyroid ultrasound.  TSH slightly low and started on low-dose Tapazole. 12.  Fever with leukocytosis: Resolved  2/11: I have personally reviewed flowsheet. Remains afebrile.  13.  Bowel incontinence:   KUB personally reviewed, unremarkable  C. difficile negative  Appears to be improving 14. Significant Nausea  Improved 15.  Dysphagia  D3 thins, advance as tolerated  Variable p.o. intake, eating 25-75% of meals 16.  Hypoalbuminemia  Supplement initiated 17.  Anemia  Hemoglobin 10.4 on 2/8  Continue to  monitor  LOS: 6 days A FACE TO FACE EVALUATION WAS PERFORMED  [name] 03/11/2019, 9:56 AM

## 2019-03-11 NOTE — Plan of Care (Signed)
LTGs upgraded from min A to Supervision overall.  Problem: RH Balance Goal: LTG: Patient will maintain dynamic sitting balance (OT) Description: LTG:  Patient will maintain dynamic sitting balance with assistance during activities of daily living (OT) Flowsheets (Taken 03/11/2019 1156) LTG: Pt will maintain dynamic sitting balance during ADLs with: Independent with assistive device   Problem: Sit to Stand Goal: LTG:  Patient will perform sit to stand in prep for activites of daily living with assistance level (OT) Description: LTG:  Patient will perform sit to stand in prep for activites of daily living with assistance level (OT) Flowsheets (Taken 03/11/2019 1156) LTG: PT will perform sit to stand in prep for activites of daily living with assistance level: Supervision/Verbal cueing   Problem: RH Grooming Goal: LTG Patient will perform grooming w/assist,cues/equip (OT) Description: LTG: Patient will perform grooming with assist, with/without cues using equipment (OT) Flowsheets (Taken 03/11/2019 1156) LTG: Pt will perform grooming with assistance level of: Independent with assistive device    Problem: RH Bathing Goal: LTG Patient will bathe all body parts with assist levels (OT) Description: LTG: Patient will bathe all body parts with assist levels (OT) Flowsheets (Taken 03/11/2019 1156) LTG: Pt will perform bathing with assistance level/cueing: Supervision/Verbal cueing   Problem: RH Dressing Goal: LTG Patient will perform upper body dressing (OT) Description: LTG Patient will perform upper body dressing with assist, with/without cues (OT). Flowsheets (Taken 03/11/2019 1156) LTG: Pt will perform upper body dressing with assistance level of: Set up assist Goal: LTG Patient will perform lower body dressing w/assist (OT) Description: LTG: Patient will perform lower body dressing with assist, with/without cues in positioning using equipment (OT) Flowsheets (Taken 03/11/2019 1156) LTG: Pt  will perform lower body dressing with assistance level of: Supervision/Verbal cueing   Problem: RH Toileting Goal: LTG Patient will perform toileting task (3/3 steps) with assistance level (OT) Description: LTG: Patient will perform toileting task (3/3 steps) with assistance level (OT)  Flowsheets (Taken 03/11/2019 1156) LTG: Pt will perform toileting task (3/3 steps) with assistance level: Supervision/Verbal cueing   Problem: RH Toilet Transfers Goal: LTG Patient will perform toilet transfers w/assist (OT) Description: LTG: Patient will perform toilet transfers with assist, with/without cues using equipment (OT) Flowsheets (Taken 03/11/2019 1156) LTG: Pt will perform toilet transfers with assistance level of: Supervision/Verbal cueing

## 2019-03-11 NOTE — Progress Notes (Signed)
Occupational Therapy Weekly Progress Note  Patient Details  Name: [name] MRN: [id number] Date of Birth: 10/04/1945  Beginning of progress report period: March 06, 2019 End of progress report period: March 11, 2019     Patient has met 3 of 3 short term goals.  Pt is making excellent progress with her cognition, mobility and self care.  She can now tolerate being out of bed in w/c for at least 1 hour or more, perform LB self care with CGA to min A, set up with UB self care and transfers with CGA to min A.  She continues to have some confusion and difficulty with her memory but it is not as limiting to her participation as it was at the start of the week.  Patient continues to demonstrate the following deficits: muscle weakness, decreased cardiorespiratoy endurance, decreased initiation, decreased attention, decreased awareness, decreased problem solving, decreased memory and delayed processing and decreased standing balance and decreased balance strategies and therefore will continue to benefit from skilled OT intervention to enhance overall performance with BADL.  Patient progressing toward long term goals..  Continue plan of care.  LTGs are currently set at min A overall,  Upgrading goals to supervision for toileting, bathing, LB dressing, toilet transfers, and sit to stand with RW.  OT Short Term Goals Week 1:  OT Short Term Goal 1 (Week 1): Pt will complete toileting tasks with 1 assist OT Short Term Goal 1 - Progress (Week 1): Met OT Short Term Goal 2 (Week 1): Pt will complete 2 self care tasks w/c level at sink to increase OOB tolerance OT Short Term Goal 2 - Progress (Week 1): Met OT Short Term Goal 3 (Week 1): Pt will complete LB dressing tasks with 1 assist at sit<stand level OT Short Term Goal 3 - Progress (Week 1): Met Week 2:  OT Short Term Goal 1 (Week 2): STGs = LTGs       Therapy Documentation Precautions:  Precautions Precautions: Fall Precaution  Comments: spinal cord stimulator Restrictions Weight Bearing Restrictions: No  ADL: ADL Eating: Set up Grooming: Setup Where Assessed-Grooming: Sitting at sink Upper Body Bathing: Setup Where Assessed-Upper Body Bathing: Sitting at sink Lower Body Bathing: Contact guard Where Assessed-Lower Body Bathing: Sitting at sink Upper Body Dressing: Setup Where Assessed-Upper Body Dressing: Wheelchair Lower Body Dressing: Minimal assistance Where Assessed-Lower Body Dressing: Wheelchair Toileting: Contact guard Where Assessed-Toileting: Glass blower/designer: Therapist, music Method: Arts development officer: Ambulance person Transfer: Not assessed  [place] 03/11/2019, 11:50 AM

## 2019-03-12 ENCOUNTER — Inpatient Hospital Stay (HOSPITAL_COMMUNITY): Payer: Medicare HMO | Admitting: Physical Therapy

## 2019-03-12 ENCOUNTER — Inpatient Hospital Stay (HOSPITAL_COMMUNITY): Payer: Medicare HMO | Admitting: Occupational Therapy

## 2019-03-12 ENCOUNTER — Inpatient Hospital Stay (HOSPITAL_COMMUNITY): Payer: Medicare HMO | Admitting: Speech Pathology

## 2019-03-12 LAB — CREATININE, SERUM
Creatinine, Ser: 0.58 mg/dL (ref 0.44–1.00)
GFR calc Af Amer: >60 mL/min (ref >60)
GFR calc non Af Amer: >60 mL/min (ref >60)

## 2019-03-12 NOTE — Progress Notes (Signed)
Physical Therapy Weekly Progress Note  Patient Details  Name: [name] MRN: [id number] Date of Birth: Jun 26, 1945  Beginning of progress report period: March 06, 2019 End of progress report period: March 12, 2019  Today's Date: 03/12/2019 PT Individual Time: 2395-3202 and 1306-1400 PT Individual Time Calculation (min): 40 min and 54 min  Patient has met 2 of 3 short term goals with 1 discontinued. [name] is progressing well with therapy demonstrating improving overall functional mobility level though continues to have impaired endurance and B LE weakness requiring frequent seated rest breaks throughout session. She is able to perform bed mobility with min assist, functional transfers using RW with min assist, and ambulate up to 81f using RW with min assist though demonstrates significant B LE fatigue and impaired endurance at end of the walk. She continues to have confusion and impaired memory requiring reorientation.   Patient continues to demonstrate the following deficits muscle weakness, decreased cardiorespiratoy endurance, impaired timing and sequencing, unbalanced muscle activation and decreased motor planning, decreased awareness, decreased problem solving, decreased memory and delayed processing,   and decreased standing balance, decreased postural control and decreased balance strategies and therefore will continue to benefit from skilled PT intervention to increase functional independence with mobility.  Patient progressing toward long term goals. Long term goals were upgraded this week based on patient's progress. Continue plan of care.  PT Short Term Goals Week 1:  PT Short Term Goal 1 (Week 1): Pt will perform least restrictive transfer with mod A consistently PT Short Term Goal 1 - Progress (Week 1): Met PT Short Term Goal 2 (Week 1): (anticipate pt will be a functional ambulator) PT Short Term Goal 2 - Progress (Week 1): Discontinued (comment) PT Short  Term Goal 3 (Week 1): Pt will initiate standing as safe and able PT Short Term Goal 3 - Progress (Week 1): Met Week 2:  PT Short Term Goal 1 (Week 2): = to LTGs based on ELOS  Skilled Therapeutic Interventions/Progress Updates:  Ambulation/gait training;Balance/vestibular training;Cognitive remediation/compensation;Community reintegration;Discharge planning;Disease management/prevention;DME/adaptive equipment instruction;Functional mobility training;Neuromuscular re-education;Pain management;Patient/family education;Psychosocial support;Splinting/orthotics;Therapeutic Activities;Therapeutic Exercise;UE/LE Strength taining/ROM;UE/LE Coordination activities;Wheelchair propulsion/positioning;Stair training;Visual/perceptual remediation/compensation   Session 1: Pt received supine in bed awake and agreeable to therapy session but requests to stay in the room at this time. Pt reports some muscle soreness and states that her body can feel where she exercised yesterday. Pt reports needing to use bathroom. Supine>sit relying on HOB elevation, bedrails, and B UE support to bring trunk upright with supervision. Continues to report mild dizziness upon coming to sit - remained seated until symptoms dissipated. Sit>stand EOB>RW with min assist for lifting - pt continues to put both hands on RW requiring cuing to correct. Gait ~170fto toilet using RW with min assist for balance. Standing with CGA for steadying performed LB clothing management without assistance. Sit<>stand on/off toilet using RW and grab bars with CGA/min assist. Pt continent of bladder and bowel - performed seated peri-care without assistance. Gait ~[id number] into room with pt needing to quickly sit in the w/c due to fatigue and B LE weakness as well as pt reporting "my tummy is doing cartwheels." RN notified of pt's stomach discomfort and present for medication administration. MD in/out for morning assessment. Initiated standing hand hygiene at sink with  min assist and pt reporting sudden need to sit quickly due to B LE fatigue. Pt defers any further standing/ambulatory tasks at this time and also defers going to therapy gym. With significant encouragement  agreeable to perform B LE exercises of 2x20 reps long arc quads and x20 reps ankle pumps. Pt left seated in w/c with needs in reach and seat belt alarm on.  Session 2: Pt received sitting in w/c with her husband present and pt reporting she is feeling better than this AM. Pt agreeable to therapy session outside of room.  Transported to/from gym in w/c for energy conservation. Stand pivot w/c<>Nustep using RW with min assist for lifting into standing and CGA for steadying while turning - cuing for sequencing and AD management. Performed B LE strengthening and activity tolerance on Nustep against level 1 resistance for 73mn30seconds and 385m (break between) totaling 104steps then 74steps respectively with pt having fatigue during 2nd set with slower movements - pt continues to demonstrate great effort to move B LEs against level 1 resistance. Gait training [id number] (seated break between) using RW with min assist for balance and pt demonstrates SOB half way through 1st walk and then at end of 2nd walk requiring standing and seated rest breaks respectively - at end of 2nd walk pt reports feeling clammy; assessed vitals: BP 154/75 (MAP 98), HR 84bpm, SpO2 98% - provided seated rest break for symptom dissipation. Pt experiencing fatigue at this point requiring encouragement to continue participating in therapy session. Attempted standing B LE heel raises using B UE support on RW with pt able to perform x3 reps (minimal heel clearance) with min assist for balance then deferring further reps due to significant difficulty performing task. Attempted standing R LE hip abduction using B UE support on RW x2 reps with min assist and pt demonstrating slight L knee buckle during stance and requesting to return to sitting due to  fatigue. Pt reports she has concerns regarding discharge and her being physically ready - therapist provided education on her progress thus far and ELOS. Performed seated B LE ankle DF against level 2 theraband resistance x15 reps each with cuing for proper form. Pt requesting to return to room due to fatigue, transported back in w/c. With encouragement pt agreeable to remain seated in w/c for Speech Therapy session later. Pt left with needs in reach, seat belt alarm on, and her husband present.   Therapy Documentation Precautions:  Precautions Precautions: Fall Precaution Comments: spinal cord stimulator Restrictions Weight Bearing Restrictions: No  Pain:   Session 1: Reports abdominal pain/discomfort - RN notified and present for medication administration.   Session 2: Denies pain during session.   Therapy/Group: Individual Therapy  CarTawana ScaleT, DPT 03/12/2019, 7:48 AM

## 2019-03-12 NOTE — Progress Notes (Addendum)
Occupational Therapy Session Note  Patient Details  Name: LALONNIE SHAFFER MRN: [id number] Date of Birth: 1945/03/09  Today's Date: 03/12/2019 OT Individual Time: 8550-1586 OT Individual Time Calculation (min): 60 min    Short Term Goals: Week 2:  OT Short Term Goal 1 (Week 2): STGs = LTGs  Skilled Therapeutic Interventions/Progress Updates:    Pt received in w/c and c/o stomach pain, declined b/d today.  RN aware. Pt agreeable to UE exercise in room.  Moved arm rests out of the way of wc to allow for more space. Used weighted dowel and light dumbells for chest and shoulder presses, bicep curls, rowing exercises.  Arom for UEs focusing on shoulder circles and large overhead arom.    Pt's spouse arrived at end of session.  Pt continued to c/o stomach pain. Belt alarm on and all needs met.  Therapy Documentation Precautions:  Precautions Precautions: Fall Precaution Comments: spinal cord stimulator Restrictions Weight Bearing Restrictions: No  Pain: Pain Assessment Pain Scale: 0-10 Pain Score: 0-No pain ADL: ADL Eating: Set up Grooming: Setup Where Assessed-Grooming: Sitting at sink Upper Body Bathing: Setup Where Assessed-Upper Body Bathing: Sitting at sink Lower Body Bathing: Contact guard Where Assessed-Lower Body Bathing: Sitting at sink Upper Body Dressing: Setup Where Assessed-Upper Body Dressing: Wheelchair Lower Body Dressing: Minimal assistance Where Assessed-Lower Body Dressing: Wheelchair Toileting: Contact guard Where Assessed-Toileting: Glass blower/designer: Therapist, music Method: Arts development officer: Energy manager: Not assessed  Therapy/Group: Individual Therapy  [place] 03/12/2019, 9:23 AM

## 2019-03-12 NOTE — Progress Notes (Signed)
[place] PHYSICAL MEDICINE & REHABILITATION PROGRESS NOTE   Subjective/Complaints: [name] feels some nausea after drinking her milk this morning. She says she does not do well with whole milk. She has received 2% milk which I tell her, and she says she does not do well with 2% milk either. Receiving Tums from RN now.  She denies pain, constipation.  ROS: Denies CP, SOB, N/V/D  Objective:   No results found. No results for input(s): WBC, HGB, HCT, PLT in the last 72 hours. Recent Labs    03/12/19 0612  CREATININE 0.58    Intake/Output Summary (Last 24 hours) at 03/12/2019 0944 Last data filed at 03/12/2019 0749 Gross per 24 hour  Intake 460 ml  Output --  Net 460 ml     Physical Exam: Vital Signs Blood pressure (!) 147/72, pulse 77, temperature 98.3 F (36.8 C), temperature source Oral, resp. rate 18, height 5\' 1"  (1.549 m), weight 75.8 kg, SpO2 94 %. Constitutional: No distress . Vital signs reviewed. Sitting up in chair.  HENT: Normocephalic.  Atraumatic. Eyes: EOMI. No discharge. Cardiovascular: No JVD. Respiratory: Normal effort.  No stridor. GI: Non-distended. Non-tender to palpation.  Skin: Warm and dry.  Intact. Psych: Normal mood.  Normal behavior. Musc: No edema in extremities.  No tenderness in extremities. Neuro:Alert and oriented to time, but not place Motor: Bilateral upper extremities: 4/5 proximal distal, unchanged Bilateral lower extremities: Hip flexion 4+/5, distally 4+/5  Assessment/Plan: 1. Functional deficits secondary to Altered mental status secondary to acute metabolic encephalopathy and recent admission [facility] healthcare for COVID-19 02/14/2019 to 02/26/2019  which require 3+ hours per day of interdisciplinary therapy in a comprehensive inpatient rehab setting.  Physiatrist is providing close team supervision and 24 hour management of active medical problems listed below.  Physiatrist and rehab team continue to assess barriers to  discharge/monitor patient progress toward functional and medical goals  Care Tool:  Bathing    Body parts bathed by patient: Right arm, Left arm, Chest, Abdomen(UB only, pt reports nursing A with LB this am)   Body parts bathed by helper: Buttocks     Bathing assist Assist Level: Set up assist     Upper Body Dressing/Undressing Upper body dressing   What is the patient wearing?: Pull over shirt    Upper body assist Assist Level: Set up assist    Lower Body Dressing/Undressing Lower body dressing      What is the patient wearing?: Underwear/pull up, Pants     Lower body assist Assist for lower body dressing: Contact Guard/Touching assist     Toileting Toileting    Toileting assist Assist for toileting: Contact Guard/Touching assist     Transfers Chair/bed transfer  Transfers assist     Chair/bed transfer assist level: Minimal Assistance - Patient > 75%     Locomotion Ambulation   Ambulation assist   Ambulation activity did not occur: Safety/medical concerns  Assist level: Minimal Assistance - Patient > 75% Assistive device: Walker-rolling Max distance: 69ft   Walk 10 feet activity   Assist  Walk 10 feet activity did not occur: Safety/medical concerns  Assist level: Minimal Assistance - Patient > 75% Assistive device: Walker-rolling   Walk 50 feet activity   Assist Walk 50 feet with 2 turns activity did not occur: Safety/medical concerns         Walk 150 feet activity   Assist Walk 150 feet activity did not occur: Safety/medical concerns         Walk  10 feet on uneven surface  activity   Assist Walk 10 feet on uneven surfaces activity did not occur: Safety/medical concerns         Wheelchair     Assist Will patient use wheelchair at discharge?: Yes Type of Wheelchair: Manual    Wheelchair assist level: Dependent - Patient 0% Max wheelchair distance: 150'    Wheelchair 50 feet with 2 turns  activity    Assist        Assist Level: Dependent - Patient 0%   Wheelchair 150 feet activity     Assist      Assist Level: Dependent - Patient 0%    Medical Problem List and Plan:  1.  Altered mental status secondary to acute metabolic encephalopathy and recent admission [facility] healthcare for COVID-19 02/14/2019 to 02/26/2019   Continue CIR PT, OT, SLP  2.  Antithrombotics: -DVT/anticoagulation: Lovenox             -antiplatelet therapy: Aspirin 81 mg daily 3. Pain Management/chronic back pain with spinal cord stimulator implant:   Patient on Neurontin 600 mg twice daily as well as oxycodone 5 mg every 6 hours as needed prior to admission, on hold due to altered mental status.             Kpad for lower back pain for comfort.   2/11-2/12: well controlled.  4. Mood: Celexa 20 mg daily, melatonin 5 mg nightly             -antipsychotic agents: Seroquel 12.5 mg nightly 5. Neuropsych: This patient is not capable of making decisions on her own behalf. 6. Skin/Wound Care: Routine skin checks.  Reported stage I sacral decubiti on admission to [facility] 7. Fluids/Electrolytes/Nutrition: Routine in and outs 8.  Hypertension.  Tenormin 50 mg daily.    Had been controlled, however labile over the last 24 hours, monitor for trend  2/11: elevated this morning. I have personally reviewed flowsheet. Has been labile. Continue to monitor.   2/12: elevated this morning. Low yesterday night. Continue to monitor.   Monitor with increased mobility 9.  History of asthma.  Continue inhalers, Singulair 10 mg nightly.  Check oxygen saturations every shift 10.  Thrush with poor appetite.  Completed course of Diflucan.  Dietary follow-up 11.  Enlargement of right thyroid with coarse calcification.  Follow-up outpatient thyroid ultrasound.  TSH slightly low and started on low-dose Tapazole. 12.  Fever with leukocytosis: Resolved  2/11: I have personally reviewed flowsheet. Remains afebrile.  13.  Bowel  incontinence:   KUB personally reviewed, unremarkable  C. difficile negative  Appears to be improving 14. Significant Nausea  Improved 15.  Dysphagia  D3 thins, advance as tolerated  Variable p.o. intake, eating 25-75% of meals 16.  Hypoalbuminemia  Supplement initiated 17.  Anemia  Hemoglobin 10.4 on 2/8  Continue to monitor 18. Nasuea: appears to be related to drinking milk this morning. Receiving tums. If does not resolve, advised that we can give her Zofran.   LOS: 7 days A FACE TO FACE EVALUATION WAS PERFORMED  Clide Deutscher Ryman Rathgeber 03/12/2019, 9:44 AM

## 2019-03-12 NOTE — Progress Notes (Signed)
Speech Language Pathology Weekly Progress and Session Note  Patient Details  Name: [name] MRN: [id number] Date of Birth: Jan 28, 1946  Beginning of progress report period: March 05, 2019 End of progress report period: March 12, 2019  Today's Date: 03/12/2019 SLP Individual Time: 1430-1500 SLP Individual Time Calculation (min): 30 min  Short Term Goals: Week 1: SLP Short Term Goal 1 (Week 1): Pt will consume dys 3 textures and thin liquids with mod I use of swallowing precautions and minimal overt s/s of aspiration. SLP Short Term Goal 1 - Progress (Week 1): Met SLP Short Term Goal 2 (Week 1): Pt will consume therapeutic trials of regular textures with supervision cues for use of swallowing precautions and minimal overt s/s of aspiration over 3 consecutive sessions prior to advancement. SLP Short Term Goal 2 - Progress (Week 1): Not met SLP Short Term Goal 3 (Week 1): Pt will sustain her attention to basic familiar tasks for 10 minute intervals with min verbal cues for redirection. SLP Short Term Goal 3 - Progress (Week 1): Met SLP Short Term Goal 4 (Week 1): Pt will complete basic, familiar tasks with min verbal cues for functional problem solving. SLP Short Term Goal 4 - Progress (Week 1): Met SLP Short Term Goal 5 (Week 1): Pt will use external aids to recall daily information with min verbal cues. SLP Short Term Goal 5 - Progress (Week 1): Progressing toward goal    New Short Term Goals: Week 2: SLP Short Term Goal 1 (Week 2): Pt will consume therapeutic trials of regular textures with supervision cues for use of swallowing precautions and minimal overt s/s of aspiration over 3 consecutive sessions prior to advancement. SLP Short Term Goal 2 (Week 2): Pt will demonstrate selective attention to tasks for 20 minute  with supervision cues for redirection. SLP Short Term Goal 3 (Week 2): Pt will complete semi-complex problem solving tasks with Min A cues. SLP Short Term  Goal 4 (Week 2): Pt will use external aids to recall daily information with min verbal cues.  Weekly Progress Updates: Pt has made good progress this reporting period and as a result she has met 3 of 5 STGs. Pt continues to consume dysphagia 3 with intermittent supervision. Pt had prolonged mastication when consuming limited trials of regular diet textures. Recommend pt continue trials of regular throughout next reporting period. Pt has experienced the most progress in her cognitive functioning. Pt is able to demonstrate sustained attention to task, use external memory aids to recall history/info of hospitalizations and her ability to problem solve basic tasks is much improved. As such, recommend continued ST to target semi-complex problem solving, advanced attention to task and continued use of memory aids.     Intensity: Minumum of 1-2 x/day, 30 to 90 minutes Frequency: 3 to 5 out of 7 days Duration/Length of Stay: 14-21 days Treatment/Interventions: Cognitive remediation/compensation;Cueing hierarchy;Functional tasks;Patient/family education;Environmental controls;Dysphagia/aspiration precaution training;Internal/external aids   Daily Session  Skilled Therapeutic Interventions:  Skilled treatment session targeted cognition goals. SLP facilitated session by having pt use external written information about hospitalizations to answer basic questions. Pt was initially impulsive with incorrect answer but able to self-correct and provided correct answers for remaining questions. SLP also facilitated session by providing Corena Pilgrim craft to target selective attention and recall of previous sessions was discussed during craft. Pt able to answer questions and attend to craft. Pt required Min A cues to provide specific information on activities within each session. Education provided to pt's husband on  areas being targeted by [facility]. Pt left upright in wheelchair, lap belt alarm in place and husband present.      General    Pain    Therapy/Group: Individual Therapy  Bonnell Placzek 03/12/2019, 4:41 PM

## 2019-03-13 ENCOUNTER — Inpatient Hospital Stay (HOSPITAL_COMMUNITY): Payer: Medicare HMO | Admitting: Physical Therapy

## 2019-03-13 ENCOUNTER — Inpatient Hospital Stay (HOSPITAL_COMMUNITY): Payer: Medicare HMO | Admitting: Speech Pathology

## 2019-03-13 MED ORDER — MELATONIN 3 MG PO TABS
6.0000 mg | ORAL_TABLET | Freq: Every day | ORAL | Status: DC
  Administered 2019-03-13 – 2019-03-16 (×5): 6 mg via ORAL
  Filled 2019-03-13 (×4): qty 2

## 2019-03-13 NOTE — Progress Notes (Signed)
[place] PHYSICAL MEDICINE & REHABILITATION PROGRESS NOTE   Subjective/Complaints:   No issues overnite , denies constipation , bladder working "overtime"   ROS: Denies CP, SOB, N/V/D  Objective:   No results found. No results for input(s): WBC, HGB, HCT, PLT in the last 72 hours. Recent Labs    03/12/19 0612  CREATININE 0.58    Intake/Output Summary (Last 24 hours) at 03/13/2019 0854 Last data filed at 03/13/2019 0818 Gross per 24 hour  Intake 340 ml  Output --  Net 340 ml     Physical Exam: Vital Signs Blood pressure 138/76, pulse 99, temperature 98.7 F (37.1 C), temperature source Oral, resp. rate 16, height 5\' 1"  (1.549 m), weight 75.8 kg, SpO2 94 %. Constitutional: No distress . Vital signs reviewed. Sitting up in chair.  HENT: Normocephalic.  Atraumatic. Eyes: EOMI. No discharge. Cardiovascular: No JVD. Respiratory: Normal effort.  No stridor. GI: Non-distended. Non-tender to palpation.  Skin: Warm and dry.  Intact. Psych: Normal mood.  Normal behavior. Musc: No edema in extremities.  No tenderness in extremities. Neuro:Alert and oriented to time, but not place Motor: Bilateral upper extremities: 4/5 proximal distal, unchanged Bilateral lower extremities: Hip flexion 4+/5, distally 4+/5  Assessment/Plan: 1. Functional deficits secondary to Altered mental status secondary to acute metabolic encephalopathy and recent admission [facility] healthcare for COVID-19 02/14/2019 to 02/26/2019  which require 3+ hours per day of interdisciplinary therapy in a comprehensive inpatient rehab setting.  Physiatrist is providing close team supervision and 24 hour management of active medical problems listed below.  Physiatrist and rehab team continue to assess barriers to discharge/monitor patient progress toward functional and medical goals  Care Tool:  Bathing    Body parts bathed by patient: Right arm, Left arm, Chest, Abdomen(UB only, pt reports nursing A with LB this am)    Body parts bathed by helper: Buttocks     Bathing assist Assist Level: Set up assist     Upper Body Dressing/Undressing Upper body dressing   What is the patient wearing?: Pull over shirt    Upper body assist Assist Level: Set up assist    Lower Body Dressing/Undressing Lower body dressing      What is the patient wearing?: Underwear/pull up, Pants     Lower body assist Assist for lower body dressing: Contact Guard/Touching assist     Toileting Toileting    Toileting assist Assist for toileting: Contact Guard/Touching assist     Transfers Chair/bed transfer  Transfers assist     Chair/bed transfer assist level: Minimal Assistance - Patient > 75% Chair/bed transfer assistive device: Programmer, multimedia   Ambulation assist   Ambulation activity did not occur: Safety/medical concerns  Assist level: Minimal Assistance - Patient > 75% Assistive device: Walker-rolling Max distance: 32ft   Walk 10 feet activity   Assist  Walk 10 feet activity did not occur: Safety/medical concerns  Assist level: Minimal Assistance - Patient > 75% Assistive device: Walker-rolling   Walk 50 feet activity   Assist Walk 50 feet with 2 turns activity did not occur: Safety/medical concerns  Assist level: Minimal Assistance - Patient > 75% Assistive device: Walker-rolling    Walk 150 feet activity   Assist Walk 150 feet activity did not occur: Safety/medical concerns         Walk 10 feet on uneven surface  activity   Assist Walk 10 feet on uneven surfaces activity did not occur: Safety/medical concerns  Wheelchair     Assist Will patient use wheelchair at discharge?: Yes Type of Wheelchair: Manual    Wheelchair assist level: Dependent - Patient 0% Max wheelchair distance: 150'    Wheelchair 50 feet with 2 turns activity    Assist        Assist Level: Dependent - Patient 0%   Wheelchair 150 feet activity      Assist      Assist Level: Dependent - Patient 0%    Medical Problem List and Plan:  1.  Altered mental status secondary to acute metabolic encephalopathy and recent admission [facility] healthcare for COVID-19 02/14/2019 to 02/26/2019   Continue CIR PT, OT, SLP  2.  Antithrombotics: -DVT/anticoagulation: Lovenox             -antiplatelet therapy: Aspirin 81 mg daily 3. Pain Management/chronic back pain with spinal cord stimulator implant:   Patient on Neurontin 600 mg twice daily as well as oxycodone 5 mg every 6 hours as needed prior to admission, on hold due to altered mental status.             Kpad for lower back pain for comfort.   2/11-2/12: well controlled.  4. Mood: Celexa 20 mg daily, melatonin 5 mg nightly             -antipsychotic agents: Seroquel 12.5 mg nightly 5. Neuropsych: This patient is not capable of making decisions on her own behalf. 6. Skin/Wound Care: Routine skin checks.  Reported stage I sacral decubiti on admission to [facility] 7. Fluids/Electrolytes/Nutrition: Routine in and outs 8.  Hypertension.  Tenormin 50 mg daily.     Vitals:   03/13/19 0457 03/13/19 0728  BP: 138/76   Pulse: 99   Resp: 16   Temp: 98.7 F (37.1 C)   SpO2: 98% 94%    Monitor with increased mobility 9.  History of asthma.  Continue inhalers, Singulair 10 mg nightly.  Check oxygen saturations every shift 10.  Thrush with poor appetite.  Completed course of Diflucan.  Dietary follow-up 11.  Enlargement of right thyroid with coarse calcification.  Follow-up outpatient thyroid ultrasound.  TSH slightly low and started on low-dose Tapazole. 12.  Fever with leukocytosis: Resolved  2/11: I have personally reviewed flowsheet. Remains afebrile.  13.  Bowel incontinence:   KUB personally reviewed, unremarkable  C. difficile negative  Appears to be improving 14. Significant Nausea  Improved 15.  Dysphagia  D3 thins, advance as tolerated  Variable p.o. intake, eating 25-75% of meals 16.   Hypoalbuminemia  Supplement initiated 17.  Anemia  Hemoglobin 10.4 on 2/8  Continue to monitor 18. Nasuea: appears to be related to drinking milk this morning. Receiving tums. If does not resolve, advised that we can give her Zofran.   LOS: 8 days A FACE TO FACE EVALUATION WAS PERFORMED  Charlett Blake 03/13/2019, 8:54 AM

## 2019-03-13 NOTE — Progress Notes (Signed)
Speech Language Pathology Daily Session Note  Patient Details  Name: [name] MRN: [id number] Date of Birth: [birth year]  Today's Date: 03/13/2019 SLP Individual Time: 1115-1200 SLP Individual Time Calculation (min): 45 min  Short Term Goals: Week 2: SLP Short Term Goal 1 (Week 2): Pt will consume therapeutic trials of regular textures with supervision cues for use of swallowing precautions and minimal overt s/s of aspiration over 3 consecutive sessions prior to advancement. SLP Short Term Goal 2 (Week 2): Pt will demonstrate selective attention to tasks for 20 minute  with supervision cues for redirection. SLP Short Term Goal 3 (Week 2): Pt will complete semi-complex problem solving tasks with Min A cues. SLP Short Term Goal 4 (Week 2): Pt will use external aids to recall daily information with min verbal cues.  Skilled Therapeutic Interventions:  Skilled treatment session targeted cognition goals. Pt's husband was in room but left during session d/t needing to make phone call. SLP facilitated session by providing Max A to Mod A multimodal cues to complete BID medication management. Specifically pt required cues to reduce self-distracting behaviors, increase mental flexibility, organize task, awareness of errors and problem solving task. Education provided to pt on areas to continue targeting. Pt stated that having "colored beads threw her off" and then explained the physicall characteristics of her medicine at home. Despite description pt continued to demonstrate decrease problem solving. Pt left upright in bed, bed alarm on and all needs within reach. Continue per current plan of care.      Pain Pain Assessment Pain Scale: 0-10 Pain Score: 0-No pain  Therapy/Group: Individual Therapy  Michalle Rademaker 03/13/2019, 12:05 PM

## 2019-03-13 NOTE — Progress Notes (Signed)
Physical Therapy Session Note  Patient Details  Name: KALAYLA SACCONE MRN: [id number] Date of Birth: 01/19/1946  Today's Date: 03/13/2019 PT Individual Time: [id number] PT Individual Time Calculation (min): 32 min   Short Term Goals: Week 2:  PT Short Term Goal 1 (Week 2): = to LTGs based on ELOS  Skilled Therapeutic Interventions/Progress Updates: Pt presents long sitting in bed w/ HOB elevated all the way.  Pt agreeable to therapy.  Pt assisted w/ pants to pull up over legs and then CGA for transfer to EOB.  Pt requires CGA for multiple sit to stand transfers throughout session w/ verbal cues for hand placement, especially w/ stand to sit, as pt fatigued and wishes to sit quickly.  Pt stood and pulled up pants w/ CGA for balance.  Pt amb short distance in room to w/c w/ RW and CGA.  Pt stood at mirror for brushing hair w/o LOB.  Pt amb multiple trials w/ RW up to 5' including turns to return to w/c w/ CGA.  Pt required seated rest breaks between trials d/t c/o SOB, w/ O2 sats at 96% on RA, but HR increased to 120's and then decreased to 100 bpm within 1-2'.  Pt wishes to remain in w/c at conclusion of session, all needs within reach and chair alarm on.     Therapy Documentation Precautions:  Precautions Precautions: Fall Precaution Comments: spinal cord stimulator Restrictions Weight Bearing Restrictions: No General:   Vital Signs: Oxygen Therapy SpO2: 94 % O2 Device: Room Air Pain: Pt had no c/o pain. Pain Assessment Pain Scale: 0-10 Pain Score: 0-No pain    Therapy/Group: Individual Therapy  Ladoris Gene 03/13/2019, 9:27 AM

## 2019-03-14 ENCOUNTER — Inpatient Hospital Stay (HOSPITAL_COMMUNITY): Payer: Medicare HMO | Admitting: Speech Pathology

## 2019-03-14 NOTE — Progress Notes (Signed)
Speech Language Pathology Daily Session Note  Patient Details  Name: [name] MRN: [id number] Date of Birth: [birth year]  Today's Date: 03/14/2019 SLP Individual Time: 1300-1343 SLP Individual Time Calculation (min): 43 min  Short Term Goals: Week 2: SLP Short Term Goal 1 (Week 2): Pt will consume therapeutic trials of regular textures with supervision cues for use of swallowing precautions and minimal overt s/s of aspiration over 3 consecutive sessions prior to advancement. SLP Short Term Goal 2 (Week 2): Pt will demonstrate selective attention to tasks for 20 minute  with supervision cues for redirection. SLP Short Term Goal 3 (Week 2): Pt will complete semi-complex problem solving tasks with Min A cues. SLP Short Term Goal 4 (Week 2): Pt will use external aids to recall daily information with min verbal cues.  Skilled Therapeutic Interventions: Pt was seen for skilled ST targeting cognitive goals. During a familiar semi-complex medication management task, pt used list of current medications to organize a BID pill box with ~80% accuracy with verbal cues for pt to detect and correct errors (putting PM pills in AM slots). Pt also continues to exhibit mental inflexibility during tasks due to different physical appearance of beads and pill box used to simulate medications (in comparison to real pills and her home medication management system). Overall Min A verbal cues for organization and redirection to tasks required throughout session. SLP also provided pt education regarding rationale behind organizing a pill box in new BID pill box system in context of new deficits. Pt stated she is aware of need for her husband to provide full assist with medication and finance management upon her return home. Pt left sitting in recliner with seatbelt alarm in place and needs met to her satisfaction. Continue per current plan of care.        Pain Pain Assessment Pain Scale: 0-10 Pain Score: 0-No  pain  Therapy/Group: Individual Therapy  Arbutus Leas 03/14/2019, 2:30 PM

## 2019-03-14 NOTE — Plan of Care (Signed)
  Problem: Consults Goal: RH GENERAL PATIENT EDUCATION Description: See Patient Education module for education specifics. Outcome: Progressing   Problem: RH BOWEL ELIMINATION Goal: RH STG MANAGE BOWEL WITH ASSISTANCE Description: STG Manage Bowel with mod I Assistance. Outcome: Progressing   Problem: RH BLADDER ELIMINATION Goal: RH STG MANAGE BLADDER WITH ASSISTANCE Description: STG Manage Bladder With mod I Assistance Outcome: Progressing   Problem: RH SKIN INTEGRITY Goal: RH STG MAINTAIN SKIN INTEGRITY WITH ASSISTANCE Description: STG Maintain Skin Integrity With mod I Assistance. Outcome: Progressing   Problem: RH SAFETY Goal: RH STG ADHERE TO SAFETY PRECAUTIONS W/ASSISTANCE/DEVICE Description: STG Adhere to Safety Precautions With cues and reminders  Outcome: Progressing   Problem: RH PAIN MANAGEMENT Goal: RH STG PAIN MANAGED AT OR BELOW PT'S PAIN GOAL Description: Pain level less than 4 on scale of 0-10 Outcome: Progressing   Problem: RH KNOWLEDGE DEFICIT GENERAL Goal: RH STG INCREASE KNOWLEDGE OF SELF CARE AFTER HOSPITALIZATION Description: Pt will be able to adhere to medication regimen, dietary and lifestyle modifications to prevent health complications mod I assist from family. Pt will be able to prevent falls with mod I assist from family.  Outcome: Progressing

## 2019-03-14 NOTE — Progress Notes (Signed)
[place] PHYSICAL MEDICINE & REHABILITATION PROGRESS NOTE   Subjective/Complaints:  Pt called CNA for toilet transfer assist, needs to have BM   ROS: Denies CP, SOB, N/V/D  Objective:   No results found. No results for input(s): WBC, HGB, HCT, PLT in the last 72 hours. Recent Labs    03/12/19 0612  CREATININE 0.58    Intake/Output Summary (Last 24 hours) at 03/14/2019 0802 Last data filed at 03/13/2019 1844 Gross per 24 hour  Intake 460 ml  Output --  Net 460 ml     Physical Exam: Vital Signs Blood pressure (!) 148/65, pulse 81, temperature 98 F (36.7 C), resp. rate 19, height 5\' 1"  (1.549 m), weight 75.8 kg, SpO2 97 %. Constitutional: No distress . Vital signs reviewed. Sitting up in chair.  HENT: Normocephalic.  Atraumatic. Eyes: EOMI. No discharge. Cardiovascular: No JVD. Respiratory: Normal effort.  No stridor. GI: Non-distended. Non-tender to palpation.  Skin: Warm and dry.  Intact. Psych: Normal mood.  Normal behavior. Musc: No edema in extremities.  No tenderness in extremities. Neuro:Alert and oriented to time, but not place Motor: Bilateral upper extremities: 4/5 proximal distal, unchanged Bilateral lower extremities: Hip flexion 4+/5, distally 4+/5  Assessment/Plan: 1. Functional deficits secondary to Altered mental status secondary to acute metabolic encephalopathy and recent admission [facility] healthcare for COVID-19 02/14/2019 to 02/26/2019  which require 3+ hours per day of interdisciplinary therapy in a comprehensive inpatient rehab setting.  Physiatrist is providing close team supervision and 24 hour management of active medical problems listed below.  Physiatrist and rehab team continue to assess barriers to discharge/monitor patient progress toward functional and medical goals  Care Tool:  Bathing    Body parts bathed by patient: Right arm, Left arm, Chest, Abdomen(UB only, pt reports nursing A with LB this am)   Body parts bathed by helper:  Buttocks     Bathing assist Assist Level: Set up assist     Upper Body Dressing/Undressing Upper body dressing   What is the patient wearing?: Pull over shirt    Upper body assist Assist Level: Set up assist    Lower Body Dressing/Undressing Lower body dressing      What is the patient wearing?: Underwear/pull up, Pants     Lower body assist Assist for lower body dressing: Contact Guard/Touching assist     Toileting Toileting    Toileting assist Assist for toileting: Contact Guard/Touching assist     Transfers Chair/bed transfer  Transfers assist     Chair/bed transfer assist level: Minimal Assistance - Patient > 75% Chair/bed transfer assistive device: Programmer, multimedia   Ambulation assist   Ambulation activity did not occur: Safety/medical concerns  Assist level: Contact Guard/Touching assist Assistive device: Walker-rolling Max distance: 70   Walk 10 feet activity   Assist  Walk 10 feet activity did not occur: Safety/medical concerns  Assist level: Contact Guard/Touching assist Assistive device: Walker-rolling   Walk 50 feet activity   Assist Walk 50 feet with 2 turns activity did not occur: Safety/medical concerns  Assist level: Contact Guard/Touching assist Assistive device: Walker-rolling    Walk 150 feet activity   Assist Walk 150 feet activity did not occur: Safety/medical concerns         Walk 10 feet on uneven surface  activity   Assist Walk 10 feet on uneven surfaces activity did not occur: Safety/medical concerns         Wheelchair     Assist Will patient use wheelchair at  discharge?: Yes Type of Wheelchair: Manual    Wheelchair assist level: Dependent - Patient 0% Max wheelchair distance: 150'    Wheelchair 50 feet with 2 turns activity    Assist        Assist Level: Dependent - Patient 0%   Wheelchair 150 feet activity     Assist      Assist Level: Dependent - Patient 0%     Medical Problem List and Plan:  1.  Altered mental status secondary to acute metabolic encephalopathy and recent admission [facility] healthcare for COVID-19 02/14/2019 to 02/26/2019   Continue CIR PT, OT, SLP Pt aware of 2/20 d/c date hoping to go home 1-2 d early 2.  Antithrombotics: -DVT/anticoagulation: Lovenox             -antiplatelet therapy: Aspirin 81 mg daily 3. Pain Management/chronic back pain with spinal cord stimulator implant:   Patient on Neurontin 600 mg twice daily as well as oxycodone 5 mg every 6 hours as needed prior to admission, on hold due to altered mental status.             Kpad for lower back pain for comfort.   2/11-2/12: well controlled.  4. Mood: Celexa 20 mg daily, melatonin 5 mg nightly             -antipsychotic agents: Seroquel 12.5 mg nightly 5. Neuropsych: This patient is not capable of making decisions on her own behalf. 6. Skin/Wound Care: Routine skin checks.  Reported stage I sacral decubiti on admission to [facility] 7. Fluids/Electrolytes/Nutrition: Routine in and outs 8.  Hypertension.  Tenormin 50 mg daily.     Vitals:   03/13/19 1919 03/14/19 0531  BP: 124/63 (!) 148/65  Pulse: 80 81  Resp: 19 19  Temp: 98.5 F (36.9 C) 98 F (36.7 C)  SpO2: 93% 97%   Controlled 2/14 9.  History of asthma.  Continue inhalers, Singulair 10 mg nightly.  Check oxygen saturations every shift 10.  Thrush with poor appetite.  Completed course of Diflucan.  Dietary follow-up 11.  Enlargement of right thyroid with coarse calcification.  Follow-up outpatient thyroid ultrasound.  TSH slightly low and started on low-dose Tapazole. 12.  Fever with leukocytosis: Resolved  2/11: I have personally reviewed flowsheet. Remains afebrile.  13.  Bowel incontinence:   Resolved, called aide for toilet transfer this am 14. Significant Nausea  Improved 15.  Dysphagia  D3 thins, advance as tolerated  Variable p.o. intake, eating 25-75% of meals 16.  Hypoalbuminemia  Supplement  initiated 17.  Anemia  Hemoglobin 10.4 on 2/8  Continue to monitor   LOS: 9 days A FACE TO FACE EVALUATION WAS PERFORMED  Charlett Blake 03/14/2019, 8:02 AM

## 2019-03-15 ENCOUNTER — Inpatient Hospital Stay (HOSPITAL_COMMUNITY): Payer: Medicare HMO | Admitting: Occupational Therapy

## 2019-03-15 ENCOUNTER — Inpatient Hospital Stay (HOSPITAL_COMMUNITY): Payer: Medicare HMO | Admitting: Physical Therapy

## 2019-03-15 ENCOUNTER — Inpatient Hospital Stay (HOSPITAL_COMMUNITY): Payer: Medicare HMO | Admitting: Speech Pathology

## 2019-03-15 NOTE — Progress Notes (Signed)
Physical Therapy Session Note  Patient Details  Name: [name] MRN: [id number] Date of Birth: 11-17-1945  Today's Date: 03/15/2019 PT Individual Time: 1105-1200 4680-3212 PT Individual Time Calculation (min): 55 min and 30 min  Short Term Goals: Week 2:  PT Short Term Goal 1 (Week 2): = to LTGs based on ELOS  Skilled Therapeutic Interventions/Progress Updates: Tx1:  Pt presented in bed agreeable to therapy. Discussed with pt use of rollator PTA and focus of this session to trial rollator for energy conservation with pt agreeable. Pt performed bed mobility with CGA and use of bed features. Performed STS supervision with rollator with pt demonstrating good safety with rollator use, checking for breaks prior to standing. Pt ambulated with rollator 20f, 2063f and 7568fith seated rests between and pt demonstrating fair awareness of when to take breaks with pt taking brief standing rest during [id number]. HR checked intermittently during ambulation with maxHR noted 125 and quickly resolve to 80s with seated rest. Discussed with pt and husband pt appears to be nearing goal level and may be possible to up d/c date. Pt's husband present for remaining part of session. Pt then transported to ortho gym for energy conservation and ambulated up/down ramp with CGA. Pt also performed car transfer directly after performing ramp at compact SUV level (Rav4) and required minA for BLE management. Pt continued to demonstrate good safety with rollator during session and all transfers. Pt transported back to room at end of session and agreeable to sit in w/c for lunch. Pt left with call bell within reach and needs met with husband present.      Tx: Pt presented in w/c agreeable to therapy. Pt stating ready to get up because bottom was sore. Performed STS from w/c with supervision with rollator. Pt ambulated [id number] rollator and supervision. Pt took seated rest and ambulated remaining distance to Wii station  in Day room. Pt participated in Wii balance tilt board. Pt participated in several rounds with cues to incorporate increased ankle strategy vs shoulders and popping hips. Pt was able to correct with verbal cues. Pt then participated in NuStep L1 x 4 min for general conditioning. HR 89 after NuStep. Pt indicating increased fatigue and was transported back to room at end of session and performed ambulatory transfer to bed. Performed sit to supine supervision with bed flat and repositioned to comfort with verbal cues. Pt left in bed with bed alarm on, call bell within reach and needs met.   Therapy Documentation Precautions:  Precautions Precautions: Fall Precaution Comments: spinal cord stimulator Restrictions Weight Bearing Restrictions: No General:   Vital Signs: Therapy Vitals Pulse Rate: 94 BP: (!) 131/59 Patient Position (if appropriate): Lying Oxygen Therapy SpO2: 97 % O2 Device: Room Air Pain: Pain Assessment Pain Scale: 0-10 Pain Score: 0-No pain Mobility:   Locomotion :    Trunk/Postural Assessment :    Balance:   Exercises:   Other Treatments:      Therapy/Group: Individual Therapy  Zaria Taha 03/15/2019, 12:12 PM

## 2019-03-15 NOTE — Progress Notes (Signed)
Social Work Patient ID: [name], female   DOB: 1945-09-24, 74 y.o.   MRN: [id number]   Per medical team, pt making gains in therapy and can d/c on Wednesday 2/17.   SW met with pt and pt husband in room to discuss team recommendations on d/c on Wednesday. Both in agreement. SW informed will follow-up about recommended DME once established. Would like to return to [facility].   SW left message for [facility] [phone]) abotu HHPT/OT/ST referral. SW waiting on follow-up.  *SW spoke with Brittney Brunette/[employer] who stated pt was d/c on 12/30. Pt is able to be picked up for services again. SW sent order for HHPT/OT/ST.    Loralee Pacas, MSW, [place] Office: [phone] Cell: [phone] Fax: [phone]

## 2019-03-15 NOTE — Progress Notes (Signed)
Occupational Therapy Session Note  Patient Details  Name: [name] MRN: [id number] Date of Birth: 1945/09/19  Today's Date: 03/15/2019 OT Individual Time: 4098-1191 OT Individual Time Calculation (min): 75 min    Short Term Goals: Week 1:  OT Short Term Goal 1 (Week 1): Pt will complete toileting tasks with 1 assist OT Short Term Goal 1 - Progress (Week 1): Met OT Short Term Goal 2 (Week 1): Pt will complete 2 self care tasks w/c level at sink to increase OOB tolerance OT Short Term Goal 2 - Progress (Week 1): Met OT Short Term Goal 3 (Week 1): Pt will complete LB dressing tasks with 1 assist at sit<stand level OT Short Term Goal 3 - Progress (Week 1): Met Week 2:  OT Short Term Goal 1 (Week 2): STGs = LTGs  Skilled Therapeutic Interventions/Progress Updates:    Pt received in bed alert and oriented, engaging in conversation. Sat from sidelying to sit with CGA.  Pt completed taking medication with RN. Pt talked about how much stronger she is feeling and that she is feeling ready to go home soon.  Pt eager to get up and bathe at the sink. Continues to decline a shower due to size of shower in the bathroom.    Pt stood from EOB to RW with S and ambulated 4 ft to sink and stood at sink to complete oral care.  Sat in w/c to complete UB b/d independently. Supervision with LB dressing except set up TEDs.  Focused on activity tolerance with ambulation with RW.  Resting HR 100. Ambulated 80 ft with her mask on and she became SOB, HR 130 but resumed to 100 within 2 min seated break.  2nd walk without mask for 60 ft and HR increased to 124 and pt felt SOB.  Rested and her HR reduced to 100.   Pt opted to rest sitting EOB, transferred to bed with S and alarm set.  Pt with all needs met.     Therapy Documentation Precautions:  Precautions Precautions: Fall Precaution Comments: spinal cord stimulator Restrictions Weight Bearing Restrictions: No    Vital Signs: Therapy  Vitals Pulse Rate: 94 BP: (!) 131/59 Patient Position (if appropriate): Lying Oxygen Therapy SpO2: 97 % O2 Device: Room Air Pain: Pain Assessment Pain Scale: 0-10 Pain Score: 0-No pain ADL: ADL Eating: Set up Grooming: Setup Where Assessed-Grooming: Sitting at sink Upper Body Bathing: Setup Where Assessed-Upper Body Bathing: Sitting at sink Lower Body Bathing: Contact guard Where Assessed-Lower Body Bathing: Sitting at sink Upper Body Dressing: Setup Where Assessed-Upper Body Dressing: Wheelchair Lower Body Dressing: Minimal assistance Where Assessed-Lower Body Dressing: Wheelchair Toileting: Contact guard Where Assessed-Toileting: Glass blower/designer: Therapist, music Method: Arts development officer: Energy manager: Not assessed  Therapy/Group: Individual Therapy  Daine Gunther 03/15/2019, 9:02 AM

## 2019-03-15 NOTE — Plan of Care (Signed)
  Problem: Consults Goal: RH GENERAL PATIENT EDUCATION Description: See Patient Education module for education specifics. Outcome: Progressing   Problem: RH BOWEL ELIMINATION Goal: RH STG MANAGE BOWEL WITH ASSISTANCE Description: STG Manage Bowel with mod I Assistance. Outcome: Progressing   Problem: RH BLADDER ELIMINATION Goal: RH STG MANAGE BLADDER WITH ASSISTANCE Description: STG Manage Bladder With mod I Assistance Outcome: Progressing   Problem: RH SKIN INTEGRITY Goal: RH STG MAINTAIN SKIN INTEGRITY WITH ASSISTANCE Description: STG Maintain Skin Integrity With mod I Assistance. Outcome: Progressing   Problem: RH SAFETY Goal: RH STG ADHERE TO SAFETY PRECAUTIONS W/ASSISTANCE/DEVICE Description: STG Adhere to Safety Precautions With cues and reminders  Outcome: Progressing   Problem: RH PAIN MANAGEMENT Goal: RH STG PAIN MANAGED AT OR BELOW PT'S PAIN GOAL Description: Pain level less than 4 on scale of 0-10 Outcome: Progressing   Problem: RH KNOWLEDGE DEFICIT GENERAL Goal: RH STG INCREASE KNOWLEDGE OF SELF CARE AFTER HOSPITALIZATION Description: Pt will be able to adhere to medication regimen, dietary and lifestyle modifications to prevent health complications mod I assist from family. Pt will be able to prevent falls with mod I assist from family.  Outcome: Progressing

## 2019-03-15 NOTE — Progress Notes (Signed)
[name] PHYSICAL MEDICINE & REHABILITATION PROGRESS NOTE   Subjective/Complaints: Had 4 BMs yesterday after receiving sorbitol. Frequency of BM slowed yesterday evening and she has not had any this morning. Feels comfortable. Denies nausea, pain. Sleeping well at night.    ROS: Denies CP, SOB, N/V/D  Objective:   No results found. No results for input(s): WBC, HGB, HCT, PLT in the last 72 hours. No results for input(s): NA, K, CL, CO2, GLUCOSE, BUN, CREATININE, CALCIUM in the last 72 hours.  Intake/Output Summary (Last 24 hours) at 03/15/2019 1042 Last data filed at 03/15/2019 0727 Gross per 24 hour  Intake 537 ml  Output --  Net 537 ml     Physical Exam: Vital Signs Blood pressure (!) 131/59, pulse 94, temperature 98.8 F (37.1 C), temperature source Oral, resp. rate 17, height 5\' 1"  (1.549 m), weight 75.8 kg, SpO2 97 %. Constitutional: No distress . Vital signs reviewed. Lying in bed, appears comfortable. HENT: Normocephalic.  Atraumatic. Eyes: EOMI. No discharge. Cardiovascular: No JVD. Respiratory: Normal effort.  No stridor. GI: Non-distended. Non-tender to palpation.  Skin: Warm and dry.  Intact. Psych: Normal mood.  Normal behavior. Musc: No edema in extremities.  No tenderness in extremities. Neuro:Alert and oriented to time, but not place Motor: Bilateral upper extremities: 4/5 proximal distal, unchanged Bilateral lower extremities: Hip flexion 4+/5, distally 4+/5  Assessment/Plan: 1. Functional deficits secondary to Altered mental status secondary to acute metabolic encephalopathy and recent admission [facility] healthcare for COVID-19 02/14/2019 to 02/26/2019  which require 3+ hours per day of interdisciplinary therapy in a comprehensive inpatient rehab setting.  Physiatrist is providing close team supervision and 24 hour management of active medical problems listed below.  Physiatrist and rehab team continue to assess barriers to discharge/monitor patient progress  toward functional and medical goals  Care Tool:  Bathing    Body parts bathed by patient: Right arm, Left arm, Chest, Abdomen, Front perineal area, Buttocks, Right upper leg, Left upper leg, Right lower leg, Left lower leg, Face   Body parts bathed by helper: Buttocks     Bathing assist Assist Level: Supervision/Verbal cueing     Upper Body Dressing/Undressing Upper body dressing   What is the patient wearing?: Pull over shirt    Upper body assist Assist Level: Independent    Lower Body Dressing/Undressing Lower body dressing      What is the patient wearing?: Underwear/pull up, Pants     Lower body assist Assist for lower body dressing: Supervision/Verbal cueing     Toileting Toileting    Toileting assist Assist for toileting: Contact Guard/Touching assist     Transfers Chair/bed transfer  Transfers assist     Chair/bed transfer assist level: Supervision/Verbal cueing Chair/bed transfer assistive device: Programmer, multimedia   Ambulation assist   Ambulation activity did not occur: Safety/medical concerns  Assist level: Contact Guard/Touching assist Assistive device: Walker-rolling Max distance: 70   Walk 10 feet activity   Assist  Walk 10 feet activity did not occur: Safety/medical concerns  Assist level: Contact Guard/Touching assist Assistive device: Walker-rolling   Walk 50 feet activity   Assist Walk 50 feet with 2 turns activity did not occur: Safety/medical concerns  Assist level: Contact Guard/Touching assist Assistive device: Walker-rolling    Walk 150 feet activity   Assist Walk 150 feet activity did not occur: Safety/medical concerns         Walk 10 feet on uneven surface  activity   Assist Walk 10 feet on  uneven surfaces activity did not occur: Safety/medical concerns         Wheelchair     Assist Will patient use wheelchair at discharge?: Yes Type of Wheelchair: Manual    Wheelchair assist  level: Dependent - Patient 0% Max wheelchair distance: 150'    Wheelchair 50 feet with 2 turns activity    Assist        Assist Level: Dependent - Patient 0%   Wheelchair 150 feet activity     Assist      Assist Level: Dependent - Patient 0%    Medical Problem List and Plan:  1.  Altered mental status secondary to acute metabolic encephalopathy and recent admission [facility] healthcare for COVID-19 02/14/2019 to 02/26/2019   Continue CIR PT, OT, SLP   Pt aware of 2/20 d/c date hoping to go home 1-2 d early 2.  Antithrombotics: -DVT/anticoagulation: Lovenox             -antiplatelet therapy: Aspirin 81 mg daily 3. Pain Management/chronic back pain with spinal cord stimulator implant:   Patient on Neurontin 600 mg twice daily as well as oxycodone 5 mg every 6 hours as needed prior to admission, on hold due to altered mental status.             Kpad for lower back pain for comfort.   2/11-2/12: well controlled.   2/15: well controlled 4. Mood: Celexa 20 mg daily, melatonin 5 mg nightly             -antipsychotic agents: Seroquel 12.5 mg nightly 5. Neuropsych: This patient is not capable of making decisions on her own behalf. 6. Skin/Wound Care: Routine skin checks.  Reported stage I sacral decubiti on admission to [facility] 7. Fluids/Electrolytes/Nutrition: Routine in and outs 8.  Hypertension.  Tenormin 50 mg daily.     Vitals:   03/15/19 0829 03/15/19 0833  BP: (!) 131/59   Pulse: 94   Resp:    Temp:    SpO2:  97%   Controlled 2/14  2/15: well controlled 9.  History of asthma.  Continue inhalers, Singulair 10 mg nightly.  Check oxygen saturations every shift 10.  Thrush with poor appetite.  Completed course of Diflucan.  Dietary follow-up 11.  Enlargement of right thyroid with coarse calcification.  Follow-up outpatient thyroid ultrasound.  TSH slightly low and started on low-dose Tapazole. 12.  Fever with leukocytosis: Resolved  2/11: I have personally reviewed  flowsheet. Remains afebrile.  13.  Bowel incontinence:   Resolved 14. Significant Nausea  Improved 15.  Dysphagia  D3 thins, advance as tolerated  Variable p.o. intake, eating 25-75% of meals 16.  Hypoalbuminemia  Supplement initiated 17.  Anemia  Hemoglobin 10.4 on 2/8  Continue to monitor   LOS: 10 days A FACE TO FACE EVALUATION WAS PERFORMED  Clide Deutscher [name] 03/15/2019, 10:42 AM

## 2019-03-15 NOTE — Progress Notes (Signed)
Speech Language Pathology Daily Session Note  Patient Details  Name: [name] MRN: [id number] Date of Birth: [birth year]  Today's Date: 03/15/2019 SLP Individual Time: 1300-1355 SLP Individual Time Calculation (min): 55 min  Short Term Goals: Week 2: SLP Short Term Goal 1 (Week 2): Pt will consume therapeutic trials of regular textures with supervision cues for use of swallowing precautions and minimal overt s/s of aspiration over 3 consecutive sessions prior to advancement. SLP Short Term Goal 2 (Week 2): Pt will demonstrate selective attention to tasks for 20 minute  with supervision cues for redirection. SLP Short Term Goal 3 (Week 2): Pt will complete semi-complex problem solving tasks with Min A cues. SLP Short Term Goal 4 (Week 2): Pt will use external aids to recall daily information with min verbal cues.  Skilled Therapeutic Interventions: Skilled treatment session focused on cognitive goals. SLP facilitated session by providing supervision level verbal cues for problem solving during a complex calendar making task in which patient had to place appointments and other important information on a calendar accurately and with enough information to maximize recall of events. Patient requested to use the bathroom and completed task with overall supervision. Patient left upright in wheelchair with alarm on and all needs within reach. Continue with current plan of care.      Pain No/Denies Pain   Therapy/Group: Individual Therapy  Nashay Brickley 03/15/2019, 3:04 PM

## 2019-03-16 ENCOUNTER — Encounter (HOSPITAL_COMMUNITY): Payer: Medicare HMO | Admitting: Occupational Therapy

## 2019-03-16 ENCOUNTER — Ambulatory Visit (HOSPITAL_COMMUNITY): Payer: Medicare HMO | Admitting: Physical Therapy

## 2019-03-16 ENCOUNTER — Inpatient Hospital Stay (HOSPITAL_COMMUNITY): Payer: Medicare HMO | Admitting: Occupational Therapy

## 2019-03-16 ENCOUNTER — Encounter (HOSPITAL_COMMUNITY): Payer: Medicare HMO | Admitting: Speech Pathology

## 2019-03-16 DIAGNOSIS — I10 Essential (primary) hypertension: Secondary | ICD-10-CM

## 2019-03-16 NOTE — Progress Notes (Signed)
Occupational Therapy Session Note  Patient Details  Name: [name] MRN: [id number] Date of Birth: 01/03/1946  Today's Date: 03/16/2019 OT Individual Time: 4327-6147 OT Individual Time Calculation (min): 60 min  and Today's Date: 03/16/2019 OT Missed Time: 15 Minutes Missed Time Reason: Patient fatigue   Short Term Goals: Week 1:  OT Short Term Goal 1 (Week 1): Pt will complete toileting tasks with 1 assist OT Short Term Goal 1 - Progress (Week 1): Met OT Short Term Goal 2 (Week 1): Pt will complete 2 self care tasks w/c level at sink to increase OOB tolerance OT Short Term Goal 2 - Progress (Week 1): Met OT Short Term Goal 3 (Week 1): Pt will complete LB dressing tasks with 1 assist at sit<stand level OT Short Term Goal 3 - Progress (Week 1): Met Week 2:  OT Short Term Goal 1 (Week 2): STGs = LTGs  Skilled Therapeutic Interventions/Progress Updates:    Pt seen this session for family education with pt and her spouse.  Pt reported that she had already bathed and dressed with the OT in the earlier session. Pt's spouse arrived and discussed pt's progress and how she has met all of her goals. Pt demonstrated to her spouse how she gets up with rollator, ambulates to toilet, uses the toilet, demonstrated stepping in and out of the shower over the ledge all with supervision. She did need occasional rest breaks.  Demonstrated to spouse an easier way to don TED hose using a plastic bag. He liked that method.   Also discussed the improvement in her cognition, that she is now fully oriented and has a good recall of new information.  At times, she does need A with new memory learning.  Her spouse feels that she is back to her "old self".  Pt agreeable to working on some endurance training walking with the rollator.  Resting HR 90 and it increased to 104-110 after short walks.  This is improved from the last 2 days.  Spouse feels comfortable A her at home and pt is ready for d/c tomorrow.    Therapy Documentation Precautions:  Precautions Precautions: Fall Precaution Comments: spinal cord stimulator Restrictions Weight Bearing Restrictions: No  General OT Amount of Missed Time: 15 Minutes Vital Signs: Therapy Vitals Pulse Rate: 89 BP: 130/75 Oxygen Therapy SpO2: 94 % O2 Device: Room Air Pain: Pain Assessment Pain Scale: 0-10 Pain Score: 0-No pain ADL: ADL Eating: Independent Grooming: Independent Where Assessed-Grooming: Sitting at sink Upper Body Bathing: Independent Where Assessed-Upper Body Bathing: Sitting at sink Lower Body Bathing: Supervision/safety Where Assessed-Lower Body Bathing: Sitting at sink, Standing at sink Upper Body Dressing: Setup Where Assessed-Upper Body Dressing: Wheelchair Lower Body Dressing: Supervision/safety Where Assessed-Lower Body Dressing: Wheelchair Toileting: Supervision/safety Where Assessed-Toileting: Glass blower/designer: Close supervision Toilet Transfer Method: Counselling psychologist: Energy manager: Not assessed Social research officer, government: Close supervision Social research officer, government Method: Heritage manager: Shower seat with back   Therapy/Group: Individual Therapy  [place] 03/16/2019, 10:59 AM

## 2019-03-16 NOTE — Progress Notes (Signed)
Social Work Patient ID: [name], female   DOB: 12/08/1945, 73 y.o.   MRN: [id number]   SW met with pt and pt husband to review d/c discharge since pt is on target; DME ordered (3in1 [employer])and HHA ([employer]). No questions/concerns reported.    , MSW, LCSWA Office: [phone] Cell: [phone] Fax: [phone]  

## 2019-03-16 NOTE — Progress Notes (Signed)
Physical Therapy Discharge Summary  Patient Details  Name: [name] MRN: [id number] Date of Birth: 1945/02/28  Today's Date: 03/16/2019 PT Individual Time: 1502-1559 PT Individual Time Calculation (min): 57 min    Patient has met 6 of 6 long term goals due to improved activity tolerance, improved balance, improved postural control, increased strength, ability to compensate for deficits, functional use of  right lower extremity and left lower extremity, improved attention and improved awareness.  Patient to discharge at an ambulatory level with Supervision.   Patient's care partner attended hands-on family training and is independent to provide the necessary physical and cognitive assistance at discharge.  All goals met. Pt discharging at ambulatory level therefore wheelchair goals discontinued.  Recommendation:  Patient will benefit from ongoing skilled PT services in home health setting to continue to advance safe functional mobility, address ongoing impairments in activity tolerance, B LE strength, standing balance, gait training, stair navigation, and minimize fall risk.  Equipment: No equipment provided; pt has all necessary equipment  Reasons for discharge: treatment goals met and discharge from hospital  Patient/family agrees with progress made and goals achieved: Yes  Skilled Therapeutic Interventions/Progress Updates: Patient received supine in bed with her husband, Merry Proud, present and pt agreeable to therapy session. Pt's husband present throughout session for hands-on training and education. Pt reports L calf pain stating it feels like soreness after a muscle spasm - details below- RN aware and premedicated. Supine>sit, HOB flat and not using bedrails, with supervision and significantly increased time and effort for trunk upright relying heavily on B UE support. Sit<>stands using rollator with close supervision throughout session - required increased cuing to lock brakes  when turning to sit in a chair. Gait training ~112f, ~547f(seated break on rollator - pt demonstrating proper safety with locking brakes and placing rollator against wall) to main therapy gym using rollator with close supervision - therapist educating pt's husband on use of gait belt as needed for CGA to increase pt safety and proper positioning to provide assist. Supine<>sit on mat table with supervision during below exercises; increased time and effort for B LE management up onto the mat and trunk upright when returning to sit.  Performed 1 set of 10reps of the following exercises to create pt's HEP while therapist educated pt/husband on proper set-up and technique: - sit<>stands with B UE support on knees  - supine bridging with level 2 theraband resistance for hip abductor activation - demonstrates poor ability to clear hips from mat (R hip lower than L) - supine straight leg raises - seated ankle DF against level 2 theraband - standing hamstring curls with UE support on rollator - standing heel raises - pt only able to tolerate 1 rep due to calf pain - educated not to perform exercise if causes increased pain at home Gait ~5089fo stairs using rollator with close supervision. Stepped up/down 1 step (6"height) using B HRs with min assist for lifting - pt had to use circumduction technique to place foot up onto the step due to hip flexor weakness and relied heavily on B UE support to bring herself onto the step - pt deferred performing any further steps despite encouragement. Transported to ortho gym in w/c. Ambulatory simulated car transfer (SUV height) using rollator with pt's husband providing CGA for safety - cuing for sequencing and safe AD management when turning to sit in car - performed 2x to reinforce pt education on proper sequencing of AD and locking rollator breaks. Ambulated ~24f70f  up/down ramp using rollator with pt's husband providing proper CGA with cuing; cued pt to use rollator breaks  to control AD on descent. Pt reporting fatigue at this time and requesting to return to room. Transported back to room in w/c. Stand pivot w/c>EOB with close supervision. Sit>supine with close supervision. Pt/family report no questions/concerns at this time. Pt left supine in bed with needs in reach and bed alarm on.  PT Discharge Precautions/Restrictions Precautions Precautions: Fall;Other (comment) Precaution Comments: spinal cord stimulator Restrictions Weight Bearing Restrictions: No Pain Pain Assessment Pain Scale: 0-10 Pain Score: 0-No pain Reports pain in L calf muscle that occurs with walking - described as sharp (like pens sticking in it), sore, and tender pain that one experiences after having a muscle cramp; pt doesn't recall having a muscle cramp though has had them in the past during her sleep - provided rest breaks as needed. Perception  Perception Perception: Within Functional Limits Praxis Praxis: Intact  Cognition  Overall Cognitive Status: Impaired/Different from baseline Arousal/Alertness: Awake/alert Orientation Level: Oriented to person;Oriented to place;Oriented to time;Disoriented to situation("[place]ona" "Hope" - reports "I sort of fell of the edge and hit my head" with pt unable to recall she had COVID) Attention: Focused;Sustained Focused Attention: Impaired Sustained Attention: Impaired Memory: Impaired Awareness: Impaired Problem Solving: Impaired Safety/Judgment: Impaired Sensation  Sensation Light Touch: Appears Intact Hot/Cold: Not tested Proprioception: Appears Intact Stereognosis: Appears Intact;Not tested Coordination Gross Motor Movements are Fluid and Coordinated: Yes  Motor  Motor Motor: Abnormal postural alignment and control;Other (comment) Motor - Discharge Observations: generalized weakness with impaired activity tolerance while walking, but functional for basic self care  Mobility Bed Mobility Bed Mobility: Rolling  Right;Rolling Left;Supine to Sit;Sit to Supine Rolling Right: Independent Rolling Left: Independent Supine to Sit: Supervision/Verbal cueing Sit to Supine: Supervision/Verbal cueing Transfers Transfers: Sit to Stand;Stand to Sit;Stand Pivot Transfers Sit to Stand: Supervision/Verbal cueing Stand to Sit: Supervision/Verbal cueing Stand Pivot Transfers: Supervision/Verbal cueing Stand Pivot Transfer Details: Verbal cues for sequencing;Verbal cues for safe use of DME/AE Transfer (Assistive device): Rollator Locomotion  Gait Ambulation: Yes Gait Assistance: Supervision/Verbal cueing Gait Distance (Feet): 150 Feet Assistive device: Rollator Gait Assistance Details: Verbal cues for safe use of DME/AE Gait Gait: Yes Gait Pattern: Impaired Gait Pattern: Decreased step length - left;Decreased step length - right;Poor foot clearance - left;Poor foot clearance - right;Trendelenburg Gait velocity: decreased Stairs / Additional Locomotion Stairs: Yes Stairs Assistance: Minimal Assistance - Patient > 75% Stair Management Technique: Two rails Number of Stairs: 1 Height of Stairs: 6 Ramp: Contact Guard/touching assist Wheelchair Mobility Wheelchair Mobility: No  Trunk/Postural Assessment  Cervical Assessment Cervical Assessment: Exceptions to WFL(forward head) Thoracic Assessment Thoracic Assessment: Exceptions to WFL(rounded shoulders) Lumbar Assessment Lumbar Assessment: Exceptions to WFL(posterior pelvic tilt) Postural Control Postural Control: Deficits on evaluation(needs UE support for balance with ambulation and dynamic standing balance)  Balance Balance Balance Assessed: Yes Static Sitting Balance Static Sitting - Level of Assistance: 7: Independent Dynamic Sitting Balance Dynamic Sitting - Balance Support: During functional activity;No upper extremity supported Static Standing Balance Static Standing - Balance Support: During functional activity;Bilateral upper extremity  supported Static Standing - Level of Assistance: 5: Stand by assistance Dynamic Standing Balance Dynamic Standing - Balance Support: During functional activity;Bilateral upper extremity supported Dynamic Standing - Level of Assistance: 5: Stand by assistance;4: Min assist Extremity Assessment  RLE Assessment RLE Assessment: Exceptions to [facility] RLE Strength Right Hip Flexion: 3/5 Right Knee Flexion: 3/5 Right Knee Extension: 4/5 Right Ankle Dorsiflexion: 3+/5  Right Ankle Plantar Flexion: 4-/5 LLE Assessment LLE Assessment: Exceptions to [facility] LLE Strength Left Hip Flexion: 3/5 Left Knee Flexion: 3/5 Left Knee Extension: 4/5 Left Ankle Dorsiflexion: 3+/5 Left Ankle Plantar Flexion: 4-/5    Tawana Scale, PT, DPT 03/16/2019, 12:59 PM

## 2019-03-16 NOTE — Progress Notes (Signed)
Occupational Therapy Discharge Summary  Patient Details  Name: [name] MRN: [id number] Date of Birth: October 14, 1945  Patient has met 9 of 9 long term goals due to improved activity tolerance, improved balance, postural control, improved attention, improved awareness and memory.  Patient to discharge at overall Supervision level.  Patient's care partner is independent to provide the necessary physical and cognitive assistance at discharge.    Reasons goals not met: n/a  Recommendation:  Patient will benefit from ongoing skilled OT services in home health setting to continue to advance functional skills in the area of BADL and iADL.  Equipment: BSC  Reasons for discharge: treatment goals met  Patient/family agrees with progress made and goals achieved: Yes  OT Discharge Precautions/Restrictions   decreased activity tolerance with increased heart rate with ambulation long distances (over 150 ft) ADL ADL Eating: Independent Grooming: Independent Where Assessed-Grooming: Sitting at sink Upper Body Bathing: Independent Where Assessed-Upper Body Bathing: Sitting at sink Lower Body Bathing: Supervision/safety Where Assessed-Lower Body Bathing: Sitting at sink, Standing at sink Upper Body Dressing: Setup Where Assessed-Upper Body Dressing: Wheelchair Lower Body Dressing: Supervision/safety Where Assessed-Lower Body Dressing: Wheelchair Toileting: Supervision/safety Where Assessed-Toileting: Glass blower/designer: Close supervision Toilet Transfer Method: Counselling psychologist: Energy manager: Not assessed Social research officer, government: Close supervision Social research officer, government Method: Heritage manager: Civil engineer, contracting with back Vision Baseline Vision/History: Wears glasses Wears Glasses: At all times Patient Visual Report: No change from baseline Perception  Perception: Within Functional Limits Praxis Praxis:  Intact Cognition Overall Cognitive Status: Impaired/Different from baseline Arousal/Alertness: Awake/alert Orientation Level: Oriented X4 Sustained Attention: Impaired Sustained Attention Impairment: Functional complex Memory: Impaired Memory Impairment: Decreased recall of new information;Decreased short term memory Decreased Short Term Memory: Functional complex Problem Solving: Impaired Problem Solving Impairment: Functional complex Safety/Judgment: Impaired Sensation Sensation Light Touch: Appears Intact Hot/Cold: Appears Intact Proprioception: Appears Intact Stereognosis: Appears Intact Coordination Gross Motor Movements are Fluid and Coordinated: Yes Fine Motor Movements are Fluid and Coordinated: Yes Motor  Motor Motor - Discharge Observations: generalized weakness with activity tolerance during walking, but functional for basic self care Mobility    Supervision with transfers in and out of shower stall and to toilet with Rollator Trunk/Postural Assessment  Postural Control Postural Control: Deficits on evaluation(needs UE support for balance with ambulation and dynamic standing balance)  Balance Dynamic Sitting Balance Dynamic Sitting - Level of Assistance: 7: Independent Dynamic Standing Balance Dynamic Standing - Level of Assistance: 5: Stand by assistance Extremity/Trunk Assessment RUE Assessment Passive Range of Motion (PROM) Comments: WFL Active Range of Motion (AROM) Comments: WFL General Strength Comments: 4-/5 due to arthritis LUE Assessment Active Range of Motion (AROM) Comments: WFL General Strength Comments: 4-/5 due to arthritis   [name] 03/16/2019, 12:35 PM

## 2019-03-16 NOTE — Progress Notes (Signed)
[place] PHYSICAL MEDICINE & REHABILITATION PROGRESS NOTE   Subjective/Complaints: Patient seen sitting up in bed this AM.  She states she slept well overnight.  She is looking forward to discharge tomorrow.   ROS: Denies CP, SOB, N/V/D  Objective:   No results found. No results for input(s): WBC, HGB, HCT, PLT in the last 72 hours. No results for input(s): NA, K, CL, CO2, GLUCOSE, BUN, CREATININE, CALCIUM in the last 72 hours. No intake or output data in the 24 hours ending 03/16/19 0939   Physical Exam: Vital Signs Blood pressure 130/75, pulse 89, temperature 98.8 F (37.1 C), temperature source Oral, resp. rate 18, height 5\' 1"  (1.549 m), weight 75.8 kg, SpO2 94 %.  Constitutional: No distress . Vital signs reviewed. HENT: Normocephalic.  Atraumatic. Eyes: EOMI. No discharge. Cardiovascular: No JVD. Respiratory: Normal effort.  No stridor. GI: Non-distended. Skin: Warm and dry.  Intact. Psych: Slowed, improving Musc: No edema in extremities.  No tenderness in extremities. Neuro:Alert and oriented x3 Motor: Bilateral upper extremities: 4-4+/5 proximal distal, unchanged Bilateral lower extremities: Hip flexion 4+/5, distally 4+/5  Assessment/Plan: 1. Functional deficits secondary to Altered mental status secondary to acute metabolic encephalopathy and recent admission [facility] for COVID-19 02/14/2019 to 02/26/2019  which require 3+ hours per day of interdisciplinary therapy in a comprehensive inpatient rehab setting.  Physiatrist is providing close team supervision and 24 hour management of active medical problems listed below.  Physiatrist and rehab team continue to assess barriers to discharge/monitor patient progress toward functional and medical goals  Care Tool:  Bathing    Body parts bathed by patient: Right arm, Left arm, Chest, Abdomen, Front perineal area, Buttocks, Right upper leg, Left upper leg, Right lower leg, Left lower leg, Face   Body parts bathed  by helper: Buttocks     Bathing assist Assist Level: Supervision/Verbal cueing     Upper Body Dressing/Undressing Upper body dressing   What is the patient wearing?: Pull over shirt    Upper body assist Assist Level: Independent    Lower Body Dressing/Undressing Lower body dressing      What is the patient wearing?: Underwear/pull up, Pants     Lower body assist Assist for lower body dressing: Supervision/Verbal cueing     Toileting Toileting    Toileting assist Assist for toileting: Contact Guard/Touching assist     Transfers Chair/bed transfer  Transfers assist     Chair/bed transfer assist level: Supervision/Verbal cueing Chair/bed transfer assistive device: (rollator)   Locomotion Ambulation   Ambulation assist   Ambulation activity did not occur: Safety/medical concerns  Assist level: Supervision/Verbal cueing Assistive device: Rollator Max distance: 257ft   Walk 10 feet activity   Assist  Walk 10 feet activity did not occur: Safety/medical concerns  Assist level: Supervision/Verbal cueing Assistive device: Rollator   Walk 50 feet activity   Assist Walk 50 feet with 2 turns activity did not occur: Safety/medical concerns  Assist level: Supervision/Verbal cueing Assistive device: Rollator    Walk 150 feet activity   Assist Walk 150 feet activity did not occur: Safety/medical concerns  Assist level: Supervision/Verbal cueing Assistive device: Rollator    Walk 10 feet on uneven surface  activity   Assist Walk 10 feet on uneven surfaces activity did not occur: Safety/medical concerns         Wheelchair     Assist Will patient use wheelchair at discharge?: Yes Type of Wheelchair: Manual    Wheelchair assist level: Dependent - Patient 0% Max  wheelchair distance: 150'    Wheelchair 50 feet with 2 turns activity    Assist        Assist Level: Dependent - Patient 0%   Wheelchair 150 feet activity      Assist      Assist Level: Dependent - Patient 0%    Medical Problem List and Plan:  1.  Altered mental status secondary to acute metabolic encephalopathy and recent admission [facility] for COVID-19 02/14/2019 to 02/26/2019   Continue CIR 2.  Antithrombotics: -DVT/anticoagulation: Lovenox             -antiplatelet therapy: Aspirin 81 mg daily 3. Pain Management/chronic back pain with spinal cord stimulator implant:   Patient on Neurontin 600 mg twice daily as well as oxycodone 5 mg every 6 hours as needed prior to admission, on hold due to altered mental status.             Kpad for lower back pain for comfort.   Controlled on 2/16 4. Mood: Celexa 20 mg daily, melatonin 5 mg nightly             -antipsychotic agents: Seroquel 12.5 mg nightly 5. Neuropsych: This patient is not capable of making decisions on her own behalf. 6. Skin/Wound Care: Routine skin checks.  Reported stage I sacral decubiti on admission to [facility] 7. Fluids/Electrolytes/Nutrition: Routine in and outs 8.  Hypertension.  Tenormin 50 mg daily.     Vitals:   03/16/19 0813 03/16/19 0831  BP: 130/75   Pulse: 89   Resp:    Temp:    SpO2: 94% 94%   Controlled on 2/16 9.  History of asthma.  Continue inhalers, Singulair 10 mg nightly.  Check oxygen saturations every shift 10.  Thrush with poor appetite.  Completed course of Diflucan.  Dietary follow-up 11.  Enlargement of right thyroid with coarse calcification.  Follow-up outpatient thyroid ultrasound.  TSH slightly low and started on low-dose Tapazole. 12.  Fever with leukocytosis: Resolved 13.  Bowel incontinence:   Resolved 14. Significant Nausea  Improved 15.  Dysphagia  D3 thins, advance as tolerated  Variable p.o. intake, eating 25-100% of meals 16.  Hypoalbuminemia  Supplement initiated 17.  Anemia  Hemoglobin 10.4 on 2/8  Continue to monitor   LOS: 11 days A FACE TO FACE EVALUATION WAS PERFORMED  [name] [name] 03/16/2019, 9:39 AM

## 2019-03-16 NOTE — Plan of Care (Signed)
  Problem: Consults Goal: RH GENERAL PATIENT EDUCATION Description: See Patient Education module for education specifics. Outcome: Progressing   Problem: RH BOWEL ELIMINATION Goal: RH STG MANAGE BOWEL WITH ASSISTANCE Description: STG Manage Bowel with mod I Assistance. Outcome: Progressing   Problem: RH BLADDER ELIMINATION Goal: RH STG MANAGE BLADDER WITH ASSISTANCE Description: STG Manage Bladder With mod I Assistance Outcome: Progressing   Problem: RH SKIN INTEGRITY Goal: RH STG MAINTAIN SKIN INTEGRITY WITH ASSISTANCE Description: STG Maintain Skin Integrity With mod I Assistance. Outcome: Progressing   Problem: RH SAFETY Goal: RH STG ADHERE TO SAFETY PRECAUTIONS W/ASSISTANCE/DEVICE Description: STG Adhere to Safety Precautions With cues and reminders  Outcome: Progressing   Problem: RH PAIN MANAGEMENT Goal: RH STG PAIN MANAGED AT OR BELOW PT'S PAIN GOAL Description: Pain level less than 4 on scale of 0-10 Outcome: Progressing   Problem: RH KNOWLEDGE DEFICIT GENERAL Goal: RH STG INCREASE KNOWLEDGE OF SELF CARE AFTER HOSPITALIZATION Description: Pt will be able to adhere to medication regimen, dietary and lifestyle modifications to prevent health complications mod I assist from family. Pt will be able to prevent falls with mod I assist from family.  Outcome: Progressing

## 2019-03-16 NOTE — Discharge Summary (Addendum)
Physician Discharge Summary  Patient ID: [name] MRN: [id number] DOB/AGE: 04-10-1945 74 y.o.  Admit date: 03/05/2019 Discharge date: 03/17/2019  Discharge Diagnoses:  Principal Problem:   Acute metabolic encephalopathy Active Problems:   Long term current use of opiate analgesic   Chronic pain syndrome   Postlaminectomy syndrome, lumbar region   Presence of functional implant ([employer] lumbar spinal cord stimulator)   Bronchial asthma   COPD (chronic obstructive pulmonary disease) (HCC)   Thrush   Fecal incontinence   Anemia   Hypoalbuminemia due to protein-calorie malnutrition (HCC)   Dysphagia   Labile blood pressure   Benign essential HTN Enlargement of right thyroid with coarse calcification  Discharged Condition: Stable  Significant Diagnostic Studies: CT ANGIO HEAD W OR WO CONTRAST  Result Date: 03/03/2019 CLINICAL DATA:  Acute neuro deficit. Rule out stroke. Recent COVID-19 EXAM: CT ANGIOGRAPHY HEAD TECHNIQUE: Multidetector CT imaging of the head was performed using the standard protocol during bolus administration of intravenous contrast. Multiplanar CT image reconstructions and MIPs were obtained to evaluate the vascular anatomy. CONTRAST:  33mL OMNIPAQUE IOHEXOL 350 MG/ML SOLN COMPARISON:  CT head 03/01/2019 FINDINGS: CT HEAD Brain: Mild atrophy. Negative for hydrocephalus. Negative for acute infarct, hemorrhage, mass. No fluid collection or midline shift. Vascular: Negative for hyperdense vessel Skull: Negative Sinuses: Mild mucosal edema sphenoid sinus. Remaining sinuses clear. Right mastoid effusion. Orbits: Negative CTA HEAD Anterior circulation: Atherosclerotic calcification in the cavernous carotid bilaterally without significant stenosis. Anterior and middle cerebral arteries widely patent without stenosis or aneurysm. Posterior circulation: Both vertebral arteries patent to the basilar. PICA patent bilaterally. Basilar widely patent. AICA, superior  cerebellar, posterior cerebral arteries patent bilaterally without stenosis or aneurysm. Venous sinuses: Normal venous enhancement Anatomic variants: None IMPRESSION: No acute intracranial abnormality Negative CTA head Electronically Signed   By: Franchot Gallo M.D.   On: 03/03/2019 12:45   DG Chest 1 View  Result Date: 03/01/2019 CLINICAL DATA:  Altered mental status. EXAM: CHEST  1 VIEW COMPARISON:  Single-view of the chest 02/14/2019 and 02/02/2019. FINDINGS: Bilateral airspace disease persists but has markedly improved since the most recent examination. No pneumothorax or pleural effusion. Heart size is normal. Atherosclerosis noted. Spinal stimulator is in place. IMPRESSION: Bilateral airspace disease persists but is markedly improved compared to the most recent exam. Electronically Signed   By: Inge Rise M.D.   On: 03/01/2019 14:16   DG Abd 1 View  Result Date: 03/08/2019 CLINICAL DATA:  Fecal incontinence. Rectal pain. EXAM: ABDOMEN - 1 VIEW COMPARISON:  03/04/2019 FINDINGS: The bowel gas pattern is unremarkable. No findings for obstruction or perforation or fecal impaction. Stable spinal cord stimulator. Right upper quadrant surgical clips from prior cholecystectomy. No worrisome calcifications. The bony structures are unremarkable. IMPRESSION: Unremarkable abdominal radiograph. Electronically Signed   By: Marijo Sanes M.D.   On: 03/08/2019 10:55   CT Head Wo Contrast  Result Date: 03/01/2019 CLINICAL DATA:  Altered mental status. EXAM: CT HEAD WITHOUT CONTRAST TECHNIQUE: Contiguous axial images were obtained from the base of the skull through the vertex without intravenous contrast. COMPARISON:  Jun 22, 2010 FINDINGS: Brain: No evidence of acute infarction, hemorrhage, hydrocephalus, extra-axial collection or mass lesion/mass effect. There is chronic diffuse atrophy. Chronic bilateral periventricular white matter small vessel ischemic changes identified. Vascular: No hyperdense vessel is  noted. Skull: Normal. Negative for fracture or focal lesion. Sinuses/Orbits: No acute finding. Other: None. IMPRESSION: No focal acute intracranial abnormality identified. Chronic diffuse atrophy. Chronic bilateral periventricular white matter small vessel  ischemic change. Electronically Signed   By: Abelardo Diesel M.D.   On: 03/01/2019 14:50   CT CERVICAL SPINE W CONTRAST  Result Date: 03/03/2019 CLINICAL DATA:  Neck pain acute.  Rule out infection. EXAM: CT CERVICAL SPINE WITH CONTRAST TECHNIQUE: Multidetector CT imaging of the cervical spine was performed during intravenous contrast administration. Multiplanar CT image reconstructions were also generated. CONTRAST:  73mL OMNIPAQUE IOHEXOL 350 MG/ML SOLN COMPARISON:  MRI cervical spine 07/21/2012 FINDINGS: Alignment: Mild anterolisthesis C4-5 and C5-6. Straightening of the cervical lordosis Skull base and vertebrae: Negative for fracture or mass. No evidence of bone infection in the cervical spine Soft tissues and spinal canal: Right lobe of the thyroid is diffusely enlarged with heterogeneous enhancement and multiple coarse calcifications. It is difficult to determine if this is a solitary nodule or multiple adjacent nodules. Overall the right lobe of the thyroid measures 32 x 18 x 52 mm. Small left lobe without nodularity. No adenopathy in neck. No soft tissue mass or edema in the neck. Disc levels: C2-3: Mild disc degeneration and spurring on the left. Mild left foraminal narrowing due to uncinate spurring C3-4: Bilateral facet degeneration.  Mild left foraminal narrowing C4-5: Mild anterolisthesis. Asymmetric facet degeneration on the left with mild left foraminal narrowing due to spurring C5-6: Mild anterolisthesis with asymmetric facet degeneration on the left. Mild left foraminal narrowing. C6-7: Disc degeneration and spondylosis with diffuse uncinate spurring. Mild foraminal narrowing bilaterally and mild spinal stenosis C7-T1: Asymmetric facet  degeneration on the right. Mild right foraminal narrowing due to spurring. Upper chest: Lung apices clear bilaterally. Other: None IMPRESSION: No acute abnormality in the cervical spine. No fracture or spinal infection Cervical spondylosis as above. Enlargement of the right lobe of the thyroid with heterogeneous enhancement multiple coarse calcifications. Possible large nodule versus goiter in the right lobe of the thyroid. Thyroid ultrasound recommended.(Ref: J Am Coll Radiol. 2015 Feb;12(2): 143-50). Electronically Signed   By: Franchot Gallo M.D.   On: 03/03/2019 12:51   US Carotid Bilateral  Result Date: 03/03/2019 CLINICAL DATA:  Weakness. Syncopal episode. History of hypertension, hyperlipidemia and smoking. EXAM: BILATERAL CAROTID DUPLEX ULTRASOUND TECHNIQUE: Pearline Cables scale imaging, color Doppler and duplex ultrasound were performed of bilateral carotid and vertebral arteries in the neck. COMPARISON:  None. FINDINGS: Criteria: Quantification of carotid stenosis is based on velocity parameters that correlate the residual internal carotid diameter with NASCET-based stenosis levels, using the diameter of the distal internal carotid lumen as the denominator for stenosis measurement. The following velocity measurements were obtained: RIGHT ICA: 119/30 cm/sec CCA: [id number] cm/sec SYSTOLIC ICA/CCA RATIO:  1.3 ECA: 94 cm/sec LEFT ICA: 95/23 cm/sec CCA: [id number] cm/sec SYSTOLIC ICA/CCA RATIO:  1.1 ECA: 81 cm/sec RIGHT CAROTID ARTERY: There is a minimal amount of eccentric echogenic plaque within the right carotid bulb (image 16), extending to involve the origin and proximal aspects of the right internal carotid artery (image 22), not resulting in elevated peak systolic velocities within the interrogated course of the right internal carotid artery to suggest a hemodynamically significant stenosis. RIGHT VERTEBRAL ARTERY:  Antegrade flow LEFT CAROTID ARTERY: There is a minimal to moderate amount of eccentric echogenic plaque  within the left carotid bulb (image 36 and 47), extending to involve the origin and proximal aspects of the left internal carotid artery (image 53), not resulting in elevated peak systolic velocities within the interrogated course of the left internal carotid artery to suggest a hemodynamically significant stenosis. LEFT VERTEBRAL ARTERY:  Antegrade flow IMPRESSION: Minimal to moderate  amount of bilateral atherosclerotic plaque, left greater than right, not resulting in a hemodynamically significant stenosis within either internal carotid artery. Electronically Signed   By: Sandi Mariscal M.D.   On: 03/03/2019 09:07   DG Chest Port 1 View  Result Date: 03/03/2019 CLINICAL DATA:  Fever.  Confusion. EXAM: PORTABLE CHEST 1 VIEW COMPARISON:  03/01/2019; 02/14/2019; 06/30/2015 FINDINGS: Grossly unchanged cardiac silhouette and mediastinal contours with atherosclerotic plaque within the thoracic aorta. There is persistent rightward deviation of the tracheal air column at the level of the thoracic aorta. There is persistent thickening the right paratracheal stripe presumably secondary prominent vasculature. Continued improved aeration of the lungs with persistent ill-defined heterogeneous opacities within the bilateral mid and lower lungs, left greater than right. No new focal airspace opacities. No pleural effusion or pneumothorax. Spinal stimulator leads overlie the mid/caudal aspect of the thoracic spine. Post cholecystectomy. IMPRESSION: 1. Continued improved aeration of the lungs with persistent bilateral mid lung interstitial opacities, nonspecific though given relatively rapid continued improvement, is favored to represent improving edema. 2. No new focal airspace opacities. Electronically Signed   By: Sandi Mariscal M.D.   On: 03/03/2019 09:09   DG Abd Portable 2V  Result Date: 03/04/2019 CLINICAL DATA:  Abdominal pain EXAM: PORTABLE ABDOMEN - 2 VIEW COMPARISON:  None. FINDINGS: Bowel gas pattern is unremarkable.  No significant stool burden. Right upper quadrant surgical clips. Spinal stimulator is present. Lower lumbar degenerative changes are noted. IMPRESSION: Normal bowel gas pattern. Electronically Signed   By: Macy Mis M.D.   On: 03/04/2019 09:27   ECHOCARDIOGRAM COMPLETE  Result Date: 03/03/2019   ECHOCARDIOGRAM REPORT   Patient Name:   [name] Date of Exam: 03/03/2019 Medical Rec #:  [id number]           Height:       61.0 in Accession #:    [id number]          Weight:       164.5 lb Date of Birth:  05/19/1945          BSA:          1.74 m Patient Age:    48 years            BP:           124/61 mmHg Patient Gender: F                   HR:           77 bpm. Exam Location:  [facility] Procedure: 2D Echo, Color Doppler and Cardiac Doppler Indications:     I163.9 Stroke  History:         Patient has no prior history of Echocardiogram examinations.                  Risk Factors:Hypertension and Dyslipidemia.  Sonographer:     Charmayne Sheer RDCS (AE) Referring Phys:  [id number] TAWFIKUL ALAM Diagnosing Phys: Yolonda Kida MD  Sonographer Comments: No subcostal window and suboptimal apical window. IMPRESSIONS  1. Left ventricular ejection fraction, by visual estimation, is 50 to 55%. The left ventricle has normal function. Normal left ventricular posterior wall thickness. There is borderline left ventricular hypertrophy.  2. Left ventricular diastolic parameters are consistent with Grade I diastolic dysfunction (impaired relaxation).  3. The left ventricle demonstrates regional wall motion abnormalities.  4. Global right ventricle has normal systolic function.The right ventricular size is normal. No increase in right ventricular wall thickness.  5. Left atrial size was normal.  6. Right atrial size was normal.  7. The mitral valve is normal in structure. Trivial mitral valve regurgitation.  8. The tricuspid valve is normal in structure.  9. The tricuspid valve is normal in structure. Tricuspid valve  regurgitation is trivial. 10. The aortic valve is normal in structure. Aortic valve regurgitation is not visualized. 11. The pulmonic valve was grossly normal. Pulmonic valve regurgitation is not visualized. FINDINGS  Left Ventricle: Left ventricular ejection fraction, by visual estimation, is 50 to 55%. The left ventricle has normal function. The left ventricle demonstrates regional wall motion abnormalities. The left ventricular internal cavity size was the left ventricle is normal in size. Normal left ventricular posterior wall thickness. There is borderline left ventricular hypertrophy. Concentric left ventricular hypertrophy. Left ventricular diastolic parameters are consistent with Grade I diastolic dysfunction (impaired relaxation). Right Ventricle: The right ventricular size is normal. No increase in right ventricular wall thickness. Global RV systolic function is has normal systolic function. Left Atrium: Left atrial size was normal in size. Right Atrium: Right atrial size was normal in size Pericardium: There is no evidence of pericardial effusion. Mitral Valve: The mitral valve is normal in structure. Trivial mitral valve regurgitation. MV peak gradient, 3.4 mmHg. Tricuspid Valve: The tricuspid valve is normal in structure. Tricuspid valve regurgitation is trivial. Aortic Valve: The aortic valve is normal in structure. Aortic valve regurgitation is not visualized. Aortic valve mean gradient measures 5.0 mmHg. Aortic valve peak gradient measures 9.2 mmHg. Aortic valve area, by VTI measures 1.71 cm. Pulmonic Valve: The pulmonic valve was grossly normal. Pulmonic valve regurgitation is not visualized. Pulmonic regurgitation is not visualized. Aorta: The aortic root is normal in size and structure. IAS/Shunts: No atrial level shunt detected by color flow Doppler.  LEFT VENTRICLE PLAX 2D LVIDd:         3.66 cm  Diastology LVIDs:         2.67 cm  LV e' lateral:   10.20 cm/s LV PW:         0.82 cm  LV E/e'  lateral: 5.7 LV IVS:        0.79 cm  LV e' medial:    9.90 cm/s LVOT diam:     1.90 cm  LV E/e' medial:  5.8 LV SV:         30 ml LV SV Index:   16.67 LVOT Area:     2.84 cm  LEFT ATRIUM         Index LA diam:    3.50 cm 2.01 cm/m  AORTIC VALVE                    PULMONIC VALVE AV Area (Vmax):    1.64 cm     PV Vmax:       0.99 m/s AV Area (Vmean):   1.48 cm     PV Vmean:      64.500 cm/s AV Area (VTI):     1.71 cm     PV VTI:        0.168 m AV Vmax:           152.00 cm/s  PV Peak grad:  3.9 mmHg AV Vmean:          106.000 cm/s PV Mean grad:  2.0 mmHg AV VTI:            0.216 m AV Peak Grad:      9.2 mmHg AV Mean  Grad:      5.0 mmHg LVOT Vmax:         87.90 cm/s LVOT Vmean:        55.200 cm/s LVOT VTI:          0.130 m LVOT/AV VTI ratio: 0.60  AORTA Ao Root diam: 2.90 cm MITRAL VALVE MV Area (PHT): 3.48 cm             SHUNTS MV Peak grad:  3.4 mmHg             Systemic VTI:  0.13 m MV Mean grad:  1.0 mmHg             Systemic Diam: 1.90 cm MV Vmax:       0.92 m/s MV Vmean:      51.0 cm/s MV VTI:        0.21 m MV PHT:        63.22 msec MV Decel Time: 218 msec MV E velocity: 57.70 cm/s 103 cm/s MV A velocity: 83.90 cm/s 70.3 cm/s MV E/A ratio:  0.69       1.5  Yolonda Kida MD Electronically signed by Yolonda Kida MD Signature Date/Time: 03/03/2019/8:54:18 PM    Final     Labs:  Basic Metabolic Panel: Recent Labs  Lab 03/12/19 0612  CREATININE 0.58    CBC: No results for input(s): WBC, NEUTROABS, HGB, HCT, MCV, PLT in the last 168 hours.  CBG: No results for input(s): GLUCAP in the last 168 hours.  Family history.  Mother with interstitial polyp Father with CAD maternal aunt with breast cancer.  Denies colon cancer diabetes mellitus or rectal cancer  Brief HPI:   [name] is a 74 y.o. right-handed female with history of hypertension, asthma, chronic back pain with spinal cord stimulator implant, hyperlipidemia, migraine headaches, quit smoking 22 years ago.  Lives with spouse  1 level home ramped entrance modified independent prior to admission.  Spouse did assist with some shower transfers.  Patient with recent [place] admission for COVID-19 infection discharge to skilled nursing facility from [facility] after being hospitalized 02/14/2019 to 02/26/2019.  Presented to [facility] 03/01/2019 from skilled facility with altered mental status loose stools low-grade fever decreased appetite as well as stage I sacral decubiti.  Admission labs WBC 20,200, hemoglobin 10.5, sodium 149, potassium 3.2, creatinine 0.59, C. difficile negative, urinalysis negative troponin high-sensitivity 34 that was repeated trending down to 28, blood cultures no growth to date, lactic acid 1.0, ammonia level 17.  Chest x-ray bilateral airspace disease markedly improved as compared to prior study.  Cranial CT scan showed no focal acute intracranial abnormality.  Chronic diffuse atrophy.  Carotid ultrasound no significant stenosis.  CT cervical spine no acute abnormality or fracture.  There was incidental findings of an enlarged right thyroid with coarse calcification recommendations were for follow-up outpatient thyroid ultrasound.  TSH level low 0.065 started on low-dose Tapazole.  Negative CTA of head.  Echocardiogram unremarkable with ejection fraction of 55%.  Neurology services consulted prophylactically maintained on aspirin.  Mental status continued to improve.  Subcutaneous Lovenox for DVT prophylaxis.  Patient was admitted for a comprehensive rehab program   Hospital Course: [name] was admitted to rehab 03/05/2019 for inpatient therapies to consist of PT, ST and OT at least three hours five days a week. Past admission physiatrist, therapy team and rehab RN have worked together to provide customized collaborative inpatient rehab.  In regards to patient's acute metabolic  encephalopathy recent admission [facility] for COVID-19 she continued to participate with therapies remained afebrile  mental status continued to improve.  Lovenox for DVT prophylaxis no bleeding episodes she continued on low-dose prophylactic aspirin.  Chronic pain managed with the use of a spinal cord stimulator implant.  Patient had been on Neurontin oxycodone held due to mental status.  Mood stabilization with the use of Celexa and melatonin she continued on low-dose Seroquel.  Blood pressures controlled on Tenormin she would follow-up with her primary MD.  History of asthma oxygen saturations greater than 90% on room air she continued on inhalers as advised.  Noted thrush with poor appetite she did complete a course of Diflucan her appetite continued to improve.  Incidental finding of enlarged right thyroid with coarse calcification follow-up outpatient ultrasound thyroid TSH level slightly low she had been started on low-dose Tapazole could follow-up thyroid panel with primary MD.   Blood pressures were monitored on TID basis and controlled  /She has made gains during rehab stay and is attending therapies  She will continue to receive follow up therapies   after discharge  Rehab course: During patient's stay in rehab weekly team conferences were held to monitor patient's progress, set goals and discuss barriers to discharge. At admission, patient required +2 physical assist sit to stand, moderate assist lateral scoot transfers, max assist sit to supine moderate assist supine to sit.  Set up lower body bathing set up upper body dressing max is lower body dressing total assist toilet transfers  Physical exam.  Blood pressure 117/69 pulse 77 temperature 97.9 respirations 18 oxygen saturation 98% room air General.  Pleasantly confused no distress HEENT. Head.  Normocephalic and atraumatic Eyes.  Pupils round and reactive to light no discharge without nystagmus Neck.  Supple nontender no JVD without thyromegaly Cardiac regular rate rhythm no extra sounds or murmur heard Chest.  Clear to auscultation without wheeze  rales or rhonchi no distress Abdomen.  Soft nontender positive bowel sounds nondistended without rebound Extremities.  No clubbing cyanosis or edema +2 pulses Skin.  Clean and intact without breakdown Neurological.  Cranial nerves II through XII intact follows commands consistently but pleasantly confused. Musculoskeletal 4+ out of 5 strength throughout except 4- out of 5 left hand grip   /She  has had improvement in activity tolerance, balance, postural control as well as ability to compensate for deficits. She has had improvement in functional use RUE/LUE  and RLE/LLE as well as improvement in awareness.  Working with energy conservation techniques.  Discussed with patient and family use of a Rollator prior to admission and focus on sessions with energy conservation.  Patient performed bed mobility contact-guard assist and use of bed features.  Performed STS supervision with Rollator with patient demonstrating good safety.  Ambulates 200 feet with assistive device.  Perform car transfers directly after performing ramp at compact SUV required minimal assist.  She can gather belongings for activities day living homemaking with safety.  Speech therapy follow-up sessions focused on cognitive goals facilitated sessions by providing supervision level verbal cues for problem solving during a complex calendar making task in which patient had to place appointments and other important information on a calendar accuratel and with enough information to maximize recall of events.  Full family teaching completed plan discharge to home       Disposition: Discharge to home    Diet: Mechanical soft  Special Instructions: No smoking driving or alcohol  Follow-up outpatient thyroid ultrasound  Medications  at discharge 1 Tylenol as needed 2 aspirin 81 mg p.o. daily 3 Tenormin 50 mg p.o. daily 4.  Vitamin D 5000 units daily 5.  Celexa 20 mg p.o. daily 6.  Fenofibrate 160 mg p.o. daily 7.  Breo Ellipta 1  puff daily 8.  Tapazole 5 mg p.o. daily 9.  Singulair 10 mg p.o. nightly 10.  Multivitamin daily 11.  Seroquel 12.5 mg p.o. nightly 12.  Requip 1 mg p.o. twice daily 12.  Senokot 1 tablet p.o. twice daily hold for loose stools 13.  Vitamin B12 1000 mcg p.o. daily 14.  Zinc sulfate 220 mg p.o. daily   Discharge Instructions     Ambulatory referral to Physical Medicine Rehab   Complete by: As directed    Moderate complexity   Ambulatory referral to Physical Medicine Rehab   Complete by: As directed    Moderate complexity follow-up 1 to 2 weeks acute metabolic encephalopathy       Follow-up Information     Jamse Arn, MD Follow up.   Specialty: Physical Medicine and Rehabilitation Why: Only as directed Contact information: [place] [id number] [phone]            Signed: Cathlyn Parsons 03/17/2019, 5:13 AM Patient was seen, face-face, and physical exam performed by me on day of discharge, less than 30 minutes of total time spent.. Please see progress note from day of discharge as well.  Delice Lesch, MD, ABPMR

## 2019-03-16 NOTE — Progress Notes (Signed)
Patient c/o leg cramps in left leg throughout the day, chronic per patient. RN gave her tylenol as requested. Her LLE was warm to touch, +2 pedal pulse, no edema. Patient states it cramps when she walks and it's from her fibromyalgia.

## 2019-03-16 NOTE — Progress Notes (Signed)
Speech Language Pathology Discharge Summary  Patient Details  Name: [name] MRN: [id number] Date of Birth: 1945-06-11  Today's Date: 03/16/2019 SLP Individual Time: 25-1430 SLP Individual Time Calculation (min): 55 min   Skilled Therapeutic Interventions: Skilled treatment session focused on cognitive and dysphagia goals and completion of family education with the patient's wife. SLP facilitated session by providing trials of regular textures. Patient required a liquid wash to help clear bolus and reported it was "scratching her throat on the way down." Therefore, recommend patient continue current diet. SLP also administrated subtests of the Cognistat. Patient demonstrated mild impairments in attention, reasoning and short-term recall. Patient's husband present and educated in regards to patient's current cognitive functioning and strategies to utilize at home to maximize attention, recall and overall safety. He was also educated on appropriate textures for current diet recommendations. He verbalized understanding and handouts were given to reinforce information. Patient left upright in bed with alarm on and all needs within reach.   Patient has met 4 of 6 long term goals.  Patient to discharge at overall Supervision;Min level.   Reasons goals not met: Patient remains on Dys. 3 textures and requires Min A verbal cues for recall   Clinical Impression/Discharge Summary: Patient has made functional gains and has met 4 of 6 LTGs this admission. Currently, patient is consuming Dys. 3 textures with thin liquids without overt s/s of aspiration and is overall Mod I for use of swallowing compensatory strategies. Patient also requires overall supervision verbal cues for sustained attention and functional problem solving with basic and familiar tasks. Min verbal cues are needed for recall with use of memory compensatory strategies. Patient and family education is complete and patient will  discharge home with 24 hour supervision from family. Patient would benefit from f/u SLP services to maximize her cognitive and swallowing function in order to reduce caregiver burden.   Care Partner:  Caregiver Able to Provide Assistance: Yes  Type of Caregiver Assistance: Cognitive  Recommendation:  Home Health SLP;24 hour supervision/assistance  Rationale for SLP Follow Up: Maximize cognitive function and independence;Reduce caregiver burden   Equipment: N/A   Reasons for discharge: Discharged from hospital;Treatment goals met   Patient/Family Agrees with Progress Made and Goals Achieved: Yes    Asheton Scheffler, [place] 03/16/2019, 10:26 AM

## 2019-03-16 NOTE — Discharge Instructions (Signed)
Inpatient Rehab Discharge Instructions  [name] Discharge date and time: No discharge date for patient encounter.   Activities/Precautions/ Functional Status: Activity: activity as tolerated Diet: soft Wound Care: Routine skin checks Functional status:  ___ No restrictions     ___ Walk up steps independently ___ 24/7 supervision/assistance   ___ Walk up steps with assistance ___ Intermittent supervision/assistance  ___ Bathe/dress independently ___ Walk with walker     __x_ Bathe/dress with assistance ___ Walk Independently    ___ Shower independently ___ Walk with assistance    ___ Shower with assistance ___ No alcohol     ___ Return to work/school ________    COMMUNITY REFERRALS UPON DISCHARGE:    Home Health:   PT     OT    ST                 [place] Phone:[phone]    Medical Equipment/Items Ordered: 3-in-1 bedside commode                                                 Agency/Supplier: [facility] [phone]    Special Instructions: No driving smoking or alcohol  Follow-up outpatient thyroid ultrasound   My questions have been answered and I understand these instructions. I will adhere to these goals and the provided educational materials after my discharge from the hospital.  Patient/Caregiver Signature _______________________________ Date __________  Clinician Signature _______________________________________ Date __________  Please bring this form and your medication list with you to all your follow-up doctor's appointments.

## 2019-03-16 NOTE — Plan of Care (Signed)
  Problem: RH Memory Goal: LTG Patient will use memory compensatory aids to (SLP) Description: LTG:  Patient will use memory compensatory aids to recall biographical/new, daily complex information with cues (SLP) Flowsheets (Taken 03/16/2019 0625) LTG: Patient will use memory compensatory aids to (SLP): Minimal Assistance - Patient > 75% Note: Downgraded 2/16

## 2019-03-17 MED ORDER — VITAMIN D-3 125 MCG (5000 UT) PO TABS: 5000.0000 [IU] | ORAL_TABLET | Freq: Every day | ORAL | 1 refills | Status: AC

## 2019-03-17 MED ORDER — ROPINIROLE HCL 1 MG PO TABS: 1.0000 mg | ORAL_TABLET | Freq: Two times a day (BID) | ORAL | 0 refills | Status: AC

## 2019-03-17 MED ORDER — BREO ELLIPTA 200-25 MCG/INH IN AEPB: 1.0000 | INHALATION_SPRAY | Freq: Every day | RESPIRATORY_TRACT | 0 refills | Status: DC

## 2019-03-17 MED ORDER — MONTELUKAST SODIUM 10 MG PO TABS: 10.0000 mg | ORAL_TABLET | Freq: Every day | ORAL | 0 refills | Status: AC

## 2019-03-17 MED ORDER — ADULT MULTIVITAMIN W/MINERALS CH: 1.0000 | ORAL_TABLET | Freq: Every day | ORAL | Status: DC

## 2019-03-17 MED ORDER — VITAMIN B-12 1000 MCG PO TABS: 1000.0000 ug | ORAL_TABLET | Freq: Every day | ORAL | 0 refills | Status: DC

## 2019-03-17 MED ORDER — SENNA 8.6 MG PO TABS: 1.0000 | ORAL_TABLET | Freq: Two times a day (BID) | ORAL | 0 refills | Status: DC

## 2019-03-17 MED ORDER — FENOFIBRATE 160 MG PO TABS: 160.0000 mg | ORAL_TABLET | Freq: Every day | ORAL | 0 refills | Status: AC

## 2019-03-17 MED ORDER — ZINC SULFATE 220 (50 ZN) MG PO CAPS: 220.0000 mg | ORAL_CAPSULE | Freq: Every day | ORAL | 0 refills | Status: DC

## 2019-03-17 MED ORDER — MELATONIN 3 MG PO TABS: 6.0000 mg | ORAL_TABLET | Freq: Every day | ORAL | 0 refills | Status: AC

## 2019-03-17 MED ORDER — ALBUTEROL SULFATE HFA 108 (90 BASE) MCG/ACT IN AERS: 1.0000 | INHALATION_SPRAY | RESPIRATORY_TRACT | 1 refills | Status: AC | PRN

## 2019-03-17 MED ORDER — ATENOLOL 50 MG PO TABS: 50.0000 mg | ORAL_TABLET | Freq: Every day | ORAL | 1 refills | Status: AC

## 2019-03-17 MED ORDER — QUETIAPINE FUMARATE 25 MG PO TABS: 12.5000 mg | ORAL_TABLET | Freq: Every day | ORAL | 0 refills | Status: DC

## 2019-03-17 MED ORDER — CITALOPRAM HYDROBROMIDE 20 MG PO TABS: 20.0000 mg | ORAL_TABLET | Freq: Every day | ORAL | 0 refills | Status: AC

## 2019-03-17 MED ORDER — METHIMAZOLE 5 MG PO TABS: 5.0000 mg | ORAL_TABLET | Freq: Every day | ORAL | 0 refills | Status: DC

## 2019-03-17 NOTE — Progress Notes (Signed)
Social Work Discharge Note   The overall goal for the admission was met for:   Discharge location: Yes. Pt discharge to home.   Length of Stay: Yes. 12 days.  Discharge activity level: Yes. Mod I to supervision; 24/7 Care due to cognitive deficits.  Home/community participation: Yes  Services provided included: MD, RD, PT, OT, SLP, RN, CM, TR, Pharmacy, Neuropsych and SW  Financial Services: Other: [employer]  Follow-up services arranged: Home Health: [facility] for PT/OT/ST and DME: [facility] [phone]; 3in1 BSC  Comments (or additional information): Pt will d/c to home with 24/7 care by husband.   Patient/Family verbalized understanding of follow-up arrangements: Yes  Individual responsible for coordination of the follow-up plan: Pt husband Merry Proud ([phone]) will coordinate pt care needs.   Confirmed correct DME delivered: Rana Snare 03/17/2019    Loralee Pacas, MSW, [place] Office: [phone] Cell: [phone] Fax: [phone]

## 2019-03-17 NOTE — Progress Notes (Signed)
[name] PHYSICAL MEDICINE & REHABILITATION PROGRESS NOTE   Subjective/Complaints: Patient seen sitting up in bed this morning.  She immediately states she is ready to home.  ROS: Denies CP, SOB, N/V/D  Objective:   No results found. No results for input(s): WBC, HGB, HCT, PLT in the last 72 hours. No results for input(s): NA, K, CL, CO2, GLUCOSE, BUN, CREATININE, CALCIUM in the last 72 hours.  Intake/Output Summary (Last 24 hours) at 03/17/2019 0926 Last data filed at 03/17/2019 0120 Gross per 24 hour  Intake 458 ml  Output 1 ml  Net 457 ml     Physical Exam: Vital Signs Blood pressure 138/73, pulse 92, temperature 98.7 F (37.1 C), resp. rate 16, height 5\' 1"  (1.549 m), weight 75.8 kg, SpO2 94 %.  Constitutional: No distress . Vital signs reviewed. HENT: Normocephalic.  Atraumatic. Eyes: EOMI. No discharge. Cardiovascular: No JVD. Respiratory: Normal effort.  No stridor. GI: Non-distended. Skin: Warm and dry.  Intact. Psych: Slowed, improving Musc: No edema in extremities.  No tenderness in extremities. Neuro:Alert and oriented x3 Motor: Bilateral upper extremities: 4+/5 proximal distal, unchanged Bilateral lower extremities: Hip flexion 4+/5, distally 4+/5  Assessment/Plan: 1. Functional deficits secondary to Altered mental status secondary to acute metabolic encephalopathy and recent admission [facility] healthcare for COVID-19 02/14/2019 to 02/26/2019  which require 3+ hours per day of interdisciplinary therapy in a comprehensive inpatient rehab setting.  Physiatrist is providing close team supervision and 24 hour management of active medical problems listed below.  Physiatrist and rehab team continue to assess barriers to discharge/monitor patient progress toward functional and medical goals  Care Tool:  Bathing    Body parts bathed by patient: Right arm, Left arm, Chest, Abdomen, Front perineal area, Buttocks, Right upper leg, Left upper leg, Right lower leg, Left  lower leg, Face   Body parts bathed by helper: Buttocks     Bathing assist Assist Level: Supervision/Verbal cueing     Upper Body Dressing/Undressing Upper body dressing   What is the patient wearing?: Pull over shirt    Upper body assist Assist Level: Independent    Lower Body Dressing/Undressing Lower body dressing      What is the patient wearing?: Underwear/pull up, Pants     Lower body assist Assist for lower body dressing: Supervision/Verbal cueing     Toileting Toileting    Toileting assist Assist for toileting: Supervision/Verbal cueing     Transfers Chair/bed transfer  Transfers assist     Chair/bed transfer assist level: Supervision/Verbal cueing Chair/bed transfer assistive device: Other(rollator)   Locomotion Ambulation   Ambulation assist   Ambulation activity did not occur: Safety/medical concerns  Assist level: Supervision/Verbal cueing Assistive device: Rollator Max distance: 172ft   Walk 10 feet activity   Assist  Walk 10 feet activity did not occur: Safety/medical concerns  Assist level: Supervision/Verbal cueing Assistive device: Rollator   Walk 50 feet activity   Assist Walk 50 feet with 2 turns activity did not occur: Safety/medical concerns  Assist level: Supervision/Verbal cueing Assistive device: Rollator    Walk 150 feet activity   Assist Walk 150 feet activity did not occur: Safety/medical concerns  Assist level: Supervision/Verbal cueing Assistive device: Rollator    Walk 10 feet on uneven surface  activity   Assist Walk 10 feet on uneven surfaces activity did not occur: Safety/medical concerns   Assist level: Contact Guard/Touching assist(up/down ramp) Assistive device: Rollator   Wheelchair     Assist Will patient use wheelchair at discharge?: No  Type of Wheelchair: Manual    Wheelchair assist level: Dependent - Patient 0% Max wheelchair distance: 150'    Wheelchair 50 feet with 2 turns  activity    Assist        Assist Level: Dependent - Patient 0%   Wheelchair 150 feet activity     Assist      Assist Level: Dependent - Patient 0%    Medical Problem List and Plan:  1.  Altered mental status secondary to acute metabolic encephalopathy and recent admission [facility] healthcare for COVID-19 02/14/2019 to 02/26/2019  DC today  Will see patient for hospital follow up in 1 month post-discharge 2.  Antithrombotics: -DVT/anticoagulation: Lovenox             -antiplatelet therapy: Aspirin 81 mg daily 3. Pain Management/chronic back pain with spinal cord stimulator implant:   Patient on Neurontin 600 mg twice daily as well as oxycodone 5 mg every 6 hours as needed prior to admission, on hold due to altered mental status.             Kpad for lower back pain for comfort.   Controlled on 2/17 4. Mood: Celexa 20 mg daily, melatonin 5 mg nightly             -antipsychotic agents: Seroquel 12.5 mg nightly 5. Neuropsych: This patient is not capable of making decisions on her own behalf. 6. Skin/Wound Care: Routine skin checks.  Reported stage I sacral decubiti on admission to [facility] 7. Fluids/Electrolytes/Nutrition: Routine in and outs 8.  Hypertension.  Tenormin 50 mg daily.     Vitals:   03/17/19 0849 03/17/19 0912  BP: 138/73   Pulse: 92   Resp:    Temp:    SpO2:  94%   Controlled on 2/17 9.  History of asthma.  Continue inhalers, Singulair 10 mg nightly.  Check oxygen saturations every shift 10.  Thrush with poor appetite.  Completed course of Diflucan.  Dietary follow-up 11.  Enlargement of right thyroid with coarse calcification.  Follow-up outpatient thyroid ultrasound.  TSH slightly low and started on low-dose Tapazole. 12.  Fever with leukocytosis: Resolved 13.  Bowel incontinence:   Resolved 14. Significant Nausea  Improved 15.  Dysphagia  D3 thins, advance as tolerated  Variable p.o. intake, eating 50-100% of meals 16.  Hypoalbuminemia  Supplement  initiated 17.  Anemia  Hemoglobin 10.4 on 2/8  Continue to monitor   LOS: 12 days A FACE TO FACE EVALUATION WAS PERFORMED  [name] [name] 03/17/2019, 9:26 AM

## 2019-03-17 NOTE — Progress Notes (Signed)
Patient discharging home with her husband. Patient was constipated and sorbitol was administered prior to discharge. Provider discussed discharge instructions. Patient left with her belongings and home equipment.

## 2019-03-17 NOTE — Plan of Care (Signed)
  Problem: Consults Goal: RH GENERAL PATIENT EDUCATION Description: See Patient Education module for education specifics. Outcome: Completed/Met   Problem: RH BOWEL ELIMINATION Goal: RH STG MANAGE BOWEL WITH ASSISTANCE Description: STG Manage Bowel with mod I Assistance. Outcome: Completed/Met   Problem: RH BLADDER ELIMINATION Goal: RH STG MANAGE BLADDER WITH ASSISTANCE Description: STG Manage Bladder With mod I Assistance Outcome: Completed/Met   Problem: RH SKIN INTEGRITY Goal: RH STG MAINTAIN SKIN INTEGRITY WITH ASSISTANCE Description: STG Maintain Skin Integrity With mod I Assistance. Outcome: Completed/Met   Problem: RH SAFETY Goal: RH STG ADHERE TO SAFETY PRECAUTIONS W/ASSISTANCE/DEVICE Description: STG Adhere to Safety Precautions With cues and reminders  Outcome: Completed/Met   Problem: RH PAIN MANAGEMENT Goal: RH STG PAIN MANAGED AT OR BELOW PT'S PAIN GOAL Description: Pain level less than 4 on scale of 0-10 Outcome: Completed/Met   Problem: RH KNOWLEDGE DEFICIT GENERAL Goal: RH STG INCREASE KNOWLEDGE OF SELF CARE AFTER HOSPITALIZATION Description: Pt will be able to adhere to medication regimen, dietary and lifestyle modifications to prevent health complications mod I assist from family. Pt will be able to prevent falls with mod I assist from family.  Outcome: Completed/Met

## 2019-03-19 ENCOUNTER — Telehealth: Payer: Self-pay | Admitting: Registered Nurse

## 2019-03-19 NOTE — Telephone Encounter (Signed)
Transitional Care call Transitional Questions Answered by [name]  Patient name: [name]  DOB: 08-18-1945 1. Are you/is patient experiencing any problems since coming home? No a. Are there any questions regarding any aspect of care? No 2. Are there any questions regarding medications administration/dosing? No a. Are meds being taken as prescribed? Yes b. "Patient should review meds with caller to confirm" Medication List Reviewed. 3. Have there been any falls? No 4. Has Home Health been to the house and/or have they contacted you? Yes, They are scheduled to come out on 03/22/2019.  a. If not, have you tried to contact them? NA b. Can we help you contact them? NA 5. Are bowels and bladder emptying properly? Yes a. Are there any unexpected incontinence issues? No b. If applicable, is patient following bowel/bladder programs? NA 6. Any fevers, problems with breathing, unexpected pain? No 7. Are there any skin problems or new areas of breakdown? No 8. Has the patient/family member arranged specialty MD follow up (ie cardiology/neurology/renal/surgical/etc.)?  [name] awaiting a return call from PCP.  a. Can we help arrange? No 9. Does the patient need any other services or support that we can help arrange? No 10. Are caregivers following through as expected in assisting the patient? Yes 11. Has the patient quit smoking, drinking alcohol, or using drugs as recommended? (                        )  Appointment date/time 03/30/2019  arrival time 1:40 for 2:00 appointment with Dr. Octavio Manns. At [place]

## 2019-03-30 ENCOUNTER — Encounter: Payer: Medicare HMO | Attending: Physical Medicine and Rehabilitation | Admitting: Physical Medicine and Rehabilitation

## 2019-03-30 ENCOUNTER — Other Ambulatory Visit: Payer: Self-pay

## 2019-03-30 VITALS — BP 164/93 | HR 73 | Temp 97.5°F | Ht 61.0 in | Wt 173.2 lb

## 2019-03-30 DIAGNOSIS — I10 Essential (primary) hypertension: Secondary | ICD-10-CM

## 2019-03-30 DIAGNOSIS — G9341 Metabolic encephalopathy: Secondary | ICD-10-CM | POA: Diagnosis present
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
## 2019-03-31 ENCOUNTER — Encounter: Payer: Self-pay | Admitting: Physical Medicine and Rehabilitation

## 2019-04-20 ENCOUNTER — Encounter: Payer: Self-pay | Admitting: Pain Medicine
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
## 2019-04-21 ENCOUNTER — Other Ambulatory Visit: Payer: Self-pay

## 2019-04-21 ENCOUNTER — Ambulatory Visit: Payer: Medicare HMO | Attending: Pain Medicine | Admitting: Pain Medicine

## 2019-04-21 DIAGNOSIS — L89151 Pressure ulcer of sacral region, stage 1: Secondary | ICD-10-CM

## 2019-04-21 DIAGNOSIS — G894 Chronic pain syndrome: Secondary | ICD-10-CM

## 2019-04-21 DIAGNOSIS — M545 Low back pain, unspecified: Secondary | ICD-10-CM

## 2019-04-21 DIAGNOSIS — G8929 Other chronic pain: Secondary | ICD-10-CM

## 2019-04-21 DIAGNOSIS — M79605 Pain in left leg: Secondary | ICD-10-CM | POA: Diagnosis not present

## 2019-04-26 ENCOUNTER — Telehealth: Payer: Self-pay | Admitting: Pain Medicine

## 2019-04-26 NOTE — Telephone Encounter (Signed)
Please check to see if Dr. Dossie Arbour sent her flexiril into [place] and let her know.thank you

## 2019-04-27 NOTE — Telephone Encounter (Signed)
Message sent to Dr Naveira 

## 2019-04-28 NOTE — Telephone Encounter (Signed)
Patient notified message has been sent to Dr. Dossie Arbour.

## 2019-05-03 ENCOUNTER — Other Ambulatory Visit: Payer: Self-pay | Admitting: Pain Medicine

## 2019-05-03 DIAGNOSIS — M797 Fibromyalgia: Secondary | ICD-10-CM

## 2019-05-03 DIAGNOSIS — M792 Neuralgia and neuritis, unspecified: Secondary | ICD-10-CM

## 2019-05-03 MED ORDER — CYCLOBENZAPRINE HCL 10 MG PO TABS: 10.0000 mg | ORAL_TABLET | Freq: Three times a day (TID) | ORAL | 5 refills | Status: AC | PRN

## 2019-06-22 ENCOUNTER — Other Ambulatory Visit: Payer: Self-pay | Admitting: Family Medicine

## 2019-06-22 DIAGNOSIS — Z1231 Encounter for screening mammogram for malignant neoplasm of breast: Secondary | ICD-10-CM

## 2019-07-23 ENCOUNTER — Other Ambulatory Visit: Payer: Self-pay | Admitting: Family Medicine

## 2019-07-23 DIAGNOSIS — N281 Cyst of kidney, acquired: Secondary | ICD-10-CM

## 2019-07-30 ENCOUNTER — Ambulatory Visit
Admission: RE | Admit: 2019-07-30 | Discharge: 2019-07-30 | Disposition: A | Discharge status: Home / Self Care | Payer: Medicare HMO | Source: Ambulatory Visit | Attending: Family Medicine | Admitting: Family Medicine

## 2019-07-30 ENCOUNTER — Other Ambulatory Visit: Payer: Self-pay

## 2019-07-30 DIAGNOSIS — N281 Cyst of kidney, acquired: Secondary | ICD-10-CM

## 2019-08-03 ENCOUNTER — Ambulatory Visit
Admission: RE | Admit: 2019-08-03 | Discharge: 2019-08-03 | Disposition: A | Discharge status: Home / Self Care | Payer: Medicare HMO | Source: Ambulatory Visit | Attending: Family Medicine | Admitting: Family Medicine

## 2019-08-03 DIAGNOSIS — Z1231 Encounter for screening mammogram for malignant neoplasm of breast: Secondary | ICD-10-CM | POA: Diagnosis present
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
## 2019-09-05 NOTE — Patient Instructions (Signed)
____________________________________________________________________________________________  Preparing for Procedure with Sedation  Procedure appointments are limited to planned procedures: . No Prescription Refills. . No disability issues will be discussed. . No medication changes will be discussed.  Instructions: . Oral Intake: Do not eat or drink anything for at least 8 hours prior to your procedure. (Exception: Blood Pressure Medication. See below.) . Transportation: Unless otherwise stated by your physician, you may drive yourself after the procedure. . Blood Pressure Medicine: Do not forget to take your blood pressure medicine with a sip of water the morning of the procedure. If your Diastolic (lower reading)is above 100 mmHg, elective cases will be cancelled/rescheduled. . Blood thinners: These will need to be stopped for procedures. Notify our staff if you are taking any blood thinners. Depending on which one you take, there will be specific instructions on how and when to stop it. . Diabetics on insulin: Notify the staff so that you can be scheduled 1st case in the morning. If your diabetes requires high dose insulin, take only  of your normal insulin dose the morning of the procedure and notify the staff that you have done so. . Preventing infections: Shower with an antibacterial soap the morning of your procedure. . Build-up your immune system: Take 1000 mg of Vitamin C with every meal (3 times a day) the day prior to your procedure. . Antibiotics: Inform the staff if you have a condition or reason that requires you to take antibiotics before dental procedures. . Pregnancy: If you are pregnant, call and cancel the procedure. . Sickness: If you have a cold, fever, or any active infections, call and cancel the procedure. . Arrival: You must be in the facility at least 30 minutes prior to your scheduled procedure. . Children: Do not bring children with you. . Dress appropriately:  Bring dark clothing that you would not mind if they get stained. . Valuables: Do not bring any jewelry or valuables.  Reasons to call and reschedule or cancel your procedure: (Following these recommendations will minimize the risk of a serious complication.) . Surgeries: Avoid having procedures within 2 weeks of any surgery. (Avoid for 2 weeks before or after any surgery). . Flu Shots: Avoid having procedures within 2 weeks of a flu shots or . (Avoid for 2 weeks before or after immunizations). . Barium: Avoid having a procedure within 7-10 days after having had a radiological study involving the use of radiological contrast. (Myelograms, Barium swallow or enema study). . Heart attacks: Avoid any elective procedures or surgeries for the initial 6 months after a "Myocardial Infarction" (Heart Attack). . Blood thinners: It is imperative that you stop these medications before procedures. Let us know if you if you take any blood thinner.  . Infection: Avoid procedures during or within two weeks of an infection (including chest colds or gastrointestinal problems). Symptoms associated with infections include: Localized redness, fever, chills, night sweats or profuse sweating, burning sensation when voiding, cough, congestion, stuffiness, runny nose, sore throat, diarrhea, nausea, vomiting, cold or Flu symptoms, recent or current infections. It is specially important if the infection is over the area that we intend to treat. . Heart and lung problems: Symptoms that may suggest an active cardiopulmonary problem include: cough, chest pain, breathing difficulties or shortness of breath, dizziness, ankle swelling, uncontrolled high or unusually low blood pressure, and/or palpitations. If you are experiencing any of these symptoms, cancel your procedure and contact your primary care physician for an evaluation.  Remember:  Regular Business hours are:    Monday to Thursday 8:00 AM to 4:00 PM  Provider's  Schedule: Paxton Kanaan, MD:  Procedure days: Tuesday and Thursday 7:30 AM to 4:00 PM  Bilal Lateef, MD:  Procedure days: Monday and Wednesday 7:30 AM to 4:00 PM ____________________________________________________________________________________________    

## 2019-09-06 ENCOUNTER — Ambulatory Visit: Payer: Medicare HMO | Attending: Pain Medicine | Admitting: Pain Medicine

## 2019-09-06 ENCOUNTER — Other Ambulatory Visit: Payer: Self-pay

## 2019-09-06 ENCOUNTER — Encounter: Payer: Self-pay | Admitting: Pain Medicine

## 2019-09-06 VITALS — BP 128/92 | HR 71 | Temp 97.2°F | Ht 61.0 in | Wt 180.0 lb

## 2019-09-06 DIAGNOSIS — M545 Low back pain: Secondary | ICD-10-CM | POA: Insufficient documentation

## 2019-09-06 DIAGNOSIS — M79605 Pain in left leg: Secondary | ICD-10-CM | POA: Diagnosis present

## 2019-09-06 DIAGNOSIS — M47816 Spondylosis without myelopathy or radiculopathy, lumbar region: Secondary | ICD-10-CM | POA: Diagnosis present

## 2019-09-06 DIAGNOSIS — M4310 Spondylolisthesis, site unspecified: Secondary | ICD-10-CM | POA: Insufficient documentation

## 2019-09-06 DIAGNOSIS — G894 Chronic pain syndrome: Secondary | ICD-10-CM | POA: Diagnosis present

## 2019-09-06 DIAGNOSIS — G8929 Other chronic pain: Secondary | ICD-10-CM | POA: Insufficient documentation

## 2019-09-06 DIAGNOSIS — M961 Postlaminectomy syndrome, not elsewhere classified: Secondary | ICD-10-CM

## 2019-09-06 NOTE — Progress Notes (Signed)
Safety precautions to be maintained throughout the outpatient stay will include: orient to surroundings, keep bed in low position, maintain call bell within reach at all times, provide assistance with transfer out of bed and ambulation.  

## 2019-09-14 ENCOUNTER — Ambulatory Visit: Payer: Medicare HMO | Admitting: Pain Medicine

## 2019-09-20 NOTE — Patient Instructions (Signed)

## 2019-09-20 NOTE — Progress Notes (Signed)
PROVIDER NOTE: Information contained herein reflects review and annotations entered in association with encounter. Interpretation of such information and data should be left to medically-trained personnel. Information provided to patient can be located elsewhere in the medical record under "Patient Instructions". Document created using STT-dictation technology, any transcriptional errors that may result from process are unintentional.    Patient: [name]  Service Category: Procedure  Provider: Gaspar Cola, MD  DOB: 04/14/1945  DOS: 09/21/2019  Location: [facility]  MRN: [id number]  Setting: Ambulatory - outpatient  Referring Provider: Milinda Pointer, MD  Type: Established Patient  Specialty: Interventional Pain Management  PCP: Randel Books, FNP   Primary Reason for Visit: Interventional Pain Management Treatment. CC: Back Pain (lumbar right )  Procedure:          Anesthesia, Analgesia, Anxiolysis:  Type: Lumbar Facet, Medial Branch Block(s) #3  Primary Purpose: Diagnostic Region: Posterolateral Lumbosacral Spine Level: L2, L3, L4, L5, & S1 Medial Branch Level(s). Injecting these levels blocks the L3-4, L4-5, and L5-S1 lumbar facet joints. Laterality: Right  Type: Moderate (Conscious) Sedation combined with Local Anesthesia Indication(s): Analgesia and Anxiety Route: Intravenous (IV) IV Access: Secured Sedation: Meaningful verbal contact was maintained at all times during the procedure  Local Anesthetic: Lidocaine 1-2%  Position: Prone   Indications: 1. Spondylosis without myelopathy or radiculopathy, lumbosacral region   2. Lumbar facet syndrome (Bilateral) (L>R)   3. Lumbar facet hypertrophy (L4-5)   4. Lumbar facet arthropathy ([place])   5. Lumbar Anterolisthesis at L4-5 (8-11 mm w/ dynamic instability) and L5-S1 (2 mm) (Stable)   6. DDD (degenerative disc disease), lumbar   7. Chronic low back pain (1ry area of Pain) (Bilateral) (L>R)     Pain Score: Pre-procedure: 4 /10 Post-procedure: 0-No pain/10   Pre-op Assessment:  [name] is a 74 y.o. (year old), female patient, seen today for interventional treatment. She  has a past surgical history that includes Vaginal hysterectomy (1975); Incontinence surgery (2001); Oophorectomy (1978); Elbow Arthroplasty (1990); Cholecystectomy (1989); Rectocele repair (2009); back implant (2015); Mastectomy partial / lumpectomy (Bilateral, 1978 ); Augmentation mammaplasty (Bilateral, 1978); Colonoscopy with propofol (N/A, 01/29/2017); Spinal cord stimulator implant; Esophagogastroduodenoscopy (egd) with propofol (N/A, 11/07/2017); and Knee surgery. Ms. Elling has a current medication list which includes the following prescription(s): acetaminophen, albuterol, alprazolam, aspirin, atenolol, calcium carbonate, vitamin d-3, citalopram, cyclobenzaprine, fenofibrate, fluticasone, fluticasone-salmeterol, hydrochlorothiazide, loperamide, melatonin, methimazole, montelukast, multivitamin with minerals, premarin, ropinirole, vitamin b-12, and zolpidem. Her primarily concern today is the Back Pain (lumbar right )  Initial Vital Signs:  Pulse/HCG Rate: 68ECG Heart Rate: 68 Temp: (!) 97.2 F (36.2 C) Resp: 16 BP: (!) 134/93 SpO2: 100 %  BMI: Estimated body mass index is 34.01 kg/m as calculated from the following:   Height as of this encounter: 5\' 1"  (1.549 m).   Weight as of this encounter: 180 lb (81.6 kg).  Risk Assessment: Allergies: Reviewed. She is allergic to aspirin, morphine, vicodin [hydrocodone-acetaminophen], niacin and related, and penicillins.  Allergy Precautions: None required Coagulopathies: Reviewed. None identified.  Blood-thinner therapy: None at this time Active Infection(s): Reviewed. None identified. Ms. Fiorini is afebrile  Site Confirmation: Ms. Willard was asked to confirm the procedure and laterality before marking the site Procedure checklist:  Completed Consent: Before the procedure and under the influence of no sedative(s), amnesic(s), or anxiolytics, the patient was informed of the treatment options, risks and possible complications. To fulfill our ethical and legal obligations, as recommended by the American Medical Association's Code  of Ethics, I have informed the patient of my clinical impression; the nature and purpose of the treatment or procedure; the risks, benefits, and possible complications of the intervention; the alternatives, including doing nothing; the risk(s) and benefit(s) of the alternative treatment(s) or procedure(s); and the risk(s) and benefit(s) of doing nothing. The patient was provided information about the general risks and possible complications associated with the procedure. These may include, but are not limited to: failure to achieve desired goals, infection, bleeding, organ or nerve damage, allergic reactions, paralysis, and death. In addition, the patient was informed of those risks and complications associated to Spine-related procedures, such as failure to decrease pain; infection (i.e.: Meningitis, epidural or intraspinal abscess); bleeding (i.e.: epidural hematoma, subarachnoid hemorrhage, or any other type of intraspinal or peri-dural bleeding); organ or nerve damage (i.e.: Any type of peripheral nerve, nerve root, or spinal cord injury) with subsequent damage to sensory, motor, and/or autonomic systems, resulting in permanent pain, numbness, and/or weakness of one or several areas of the body; allergic reactions; (i.e.: anaphylactic reaction); and/or death. Furthermore, the patient was informed of those risks and complications associated with the medications. These include, but are not limited to: allergic reactions (i.e.: anaphylactic or anaphylactoid reaction(s)); adrenal axis suppression; blood sugar elevation that in diabetics may result in ketoacidosis or comma; water retention that in patients with history  of congestive heart failure may result in shortness of breath, pulmonary edema, and decompensation with resultant heart failure; weight gain; swelling or edema; medication-induced neural toxicity; particulate matter embolism and blood vessel occlusion with resultant organ, and/or nervous system infarction; and/or aseptic necrosis of one or more joints. Finally, the patient was informed that Medicine is not an exact science; therefore, there is also the possibility of unforeseen or unpredictable risks and/or possible complications that may result in a catastrophic outcome. The patient indicated having understood very clearly. We have given the patient no guarantees and we have made no promises. Enough time was given to the patient to ask questions, all of which were answered to the patient's satisfaction. Ms. Scarboro has indicated that she wanted to continue with the procedure. Attestation: I, the ordering provider, attest that I have discussed with the patient the benefits, risks, side-effects, alternatives, likelihood of achieving goals, and potential problems during recovery for the procedure that I have provided informed consent. Date  Time: 09/21/2019  8:18 AM  Pre-Procedure Preparation:  Monitoring: As per clinic protocol. Respiration, ETCO2, SpO2, BP, heart rate and rhythm monitor placed and checked for adequate function Safety Precautions: Patient was assessed for positional comfort and pressure points before starting the procedure. Time-out: I initiated and conducted the "Time-out" before starting the procedure, as per protocol. The patient was asked to participate by confirming the accuracy of the "Time Out" information. Verification of the correct person, site, and procedure were performed and confirmed by me, the nursing staff, and the patient. "Time-out" conducted as per Joint Commission's Universal Protocol (UP.01.01.01). Time: 0932  Description of Procedure:          Laterality:  Right Levels:  L2, L3, L4, L5, & S1 Medial Branch Level(s) Area Prepped: Posterior Lumbosacral Region DuraPrep (Iodine Povacrylex [0.7% available iodine] and Isopropyl Alcohol, 74% w/w) Safety Precautions: Aspiration looking for blood return was conducted prior to all injections. At no point did we inject any substances, as a needle was being advanced. Before injecting, the patient was told to immediately notify me if she was experiencing any new onset of "ringing in the ears, or  metallic taste in the mouth". No attempts were made at seeking any paresthesias. Safe injection practices and needle disposal techniques used. Medications properly checked for expiration dates. SDV (single dose vial) medications used. After the completion of the procedure, all disposable equipment used was discarded in the proper designated medical waste containers. Local Anesthesia: Protocol guidelines were followed. The patient was positioned over the fluoroscopy table. The area was prepped in the usual manner. The time-out was completed. The target area was identified using fluoroscopy. A 12-in long, straight, sterile hemostat was used with fluoroscopic guidance to locate the targets for each level blocked. Once located, the skin was marked with an approved surgical skin marker. Once all sites were marked, the skin (epidermis, dermis, and hypodermis), as well as deeper tissues (fat, connective tissue and muscle) were infiltrated with a small amount of a short-acting local anesthetic, loaded on a 10cc syringe with a 25G, 1.5-in  Needle. An appropriate amount of time was allowed for local anesthetics to take effect before proceeding to the next step. Local Anesthetic: Lidocaine 2.0% The unused portion of the local anesthetic was discarded in the proper designated containers. Technical explanation of process:  L2 Medial Branch Nerve Block (MBB): The target area for the L2 medial branch is at the junction of the postero-lateral  aspect of the superior articular process and the superior, posterior, and medial edge of the transverse process of L3. Under fluoroscopic guidance, a Quincke needle was inserted until contact was made with os over the superior postero-lateral aspect of the pedicular shadow (target area). After negative aspiration for blood, 0.5 mL of the nerve block solution was injected without difficulty or complication. The needle was removed intact. L3 Medial Branch Nerve Block (MBB): The target area for the L3 medial branch is at the junction of the postero-lateral aspect of the superior articular process and the superior, posterior, and medial edge of the transverse process of L4. Under fluoroscopic guidance, a Quincke needle was inserted until contact was made with os over the superior postero-lateral aspect of the pedicular shadow (target area). After negative aspiration for blood, 0.5 mL of the nerve block solution was injected without difficulty or complication. The needle was removed intact. L4 Medial Branch Nerve Block (MBB): The target area for the L4 medial branch is at the junction of the postero-lateral aspect of the superior articular process and the superior, posterior, and medial edge of the transverse process of L5. Under fluoroscopic guidance, a Quincke needle was inserted until contact was made with os over the superior postero-lateral aspect of the pedicular shadow (target area). After negative aspiration for blood, 0.5 mL of the nerve block solution was injected without difficulty or complication. The needle was removed intact. L5 Medial Branch Nerve Block (MBB): The target area for the L5 medial branch is at the junction of the postero-lateral aspect of the superior articular process and the superior, posterior, and medial edge of the sacral ala. Under fluoroscopic guidance, a Quincke needle was inserted until contact was made with os over the superior postero-lateral aspect of the pedicular shadow  (target area). After negative aspiration for blood, 0.5 mL of the nerve block solution was injected without difficulty or complication. The needle was removed intact. S1 Medial Branch Nerve Block (MBB): The target area for the S1 medial branch is at the posterior and inferior 6 o'clock position of the L5-S1 facet joint. Under fluoroscopic guidance, the Quincke needle inserted for the L5 MBB was redirected until contact was made with os  over the inferior and postero aspect of the sacrum, at the 6 o' clock position under the L5-S1 facet joint (Target area). After negative aspiration for blood, 0.5 mL of the nerve block solution was injected without difficulty or complication. The needle was removed intact.  Nerve block solution: 0.2% PF-Ropivacaine + Triamcinolone (40 mg/mL) diluted to a final concentration of 4 mg of Triamcinolone/mL of Ropivacaine The unused portion of the solution was discarded in the proper designated containers. Procedural Needles: 22-gauge, 3.5-inch, Quincke needles used for all levels.  Once the entire procedure was completed, the treated area was cleaned, making sure to leave some of the prepping solution back to take advantage of its long term bactericidal properties.   Illustration of the posterior view of the lumbar spine and the posterior neural structures. Laminae of L2 through S1 are labeled. DPRL5, dorsal primary ramus of L5; DPRS1, dorsal primary ramus of S1; DPR3, dorsal primary ramus of L3; FJ, facet (zygapophyseal) joint L3-L4; I, inferior articular process of L4; LB1, lateral branch of dorsal primary ramus of L1; IAB, inferior articular branches from L3 medial branch (supplies L4-L5 facet joint); IBP, intermediate branch plexus; MB3, medial branch of dorsal primary ramus of L3; NR3, third lumbar nerve root; S, superior articular process of L5; SAB, superior articular branches from L4 (supplies L4-5 facet joint also); TP3, transverse process of L3.  Vitals:   09/21/19  0939 09/21/19 0947 09/21/19 0957 09/21/19 1000  BP: 108/62 (!) 135/110 123/67 134/78  Pulse:      Resp: 16 18 (!) 25 17  Temp:  (!) 96.6 F (35.9 C)  (!) 96.6 F (35.9 C)  TempSrc:  Temporal  Temporal  SpO2: 99% 100% 95% 96%  Weight:      Height:         Start Time: 0932 hrs. End Time: 0938 hrs.  Imaging Guidance (Spinal):          Type of Imaging Technique: Fluoroscopy Guidance (Spinal) Indication(s): Assistance in needle guidance and placement for procedures requiring needle placement in or near specific anatomical locations not easily accessible without such assistance. Exposure Time: Please see nurses notes. Contrast: None used. Fluoroscopic Guidance: I was personally present during the use of fluoroscopy. "Tunnel Vision Technique" used to obtain the best possible view of the target area. Parallax error corrected before commencing the procedure. "Direction-depth-direction" technique used to introduce the needle under continuous pulsed fluoroscopy. Once target was reached, antero-posterior, oblique, and lateral fluoroscopic projection used confirm needle placement in all planes. Images permanently stored in EMR. Interpretation: No contrast injected. I personally interpreted the imaging intraoperatively. Adequate needle placement confirmed in multiple planes. Permanent images saved into the patient's record.  Antibiotic Prophylaxis:   Anti-infectives (From admission, onward)   None     Indication(s): None identified  Post-operative Assessment:  Post-procedure Vital Signs:  Pulse/HCG Rate: 6865 Temp: (!) 96.6 F (35.9 C) Resp: 17 BP: 134/78 SpO2: 96 %  EBL: None  Complications: No immediate post-treatment complications observed by team, or reported by patient.  Note: The patient tolerated the entire procedure well. A repeat set of vitals were taken after the procedure and the patient was kept under observation following institutional policy, for this type of procedure.  Post-procedural neurological assessment was performed, showing return to baseline, prior to discharge. The patient was provided with post-procedure discharge instructions, including a section on how to identify potential problems. Should any problems arise concerning this procedure, the patient was given instructions to immediately contact us, at any  time, without hesitation. In any case, we plan to contact the patient by telephone for a follow-up status report regarding this interventional procedure.  Comments:  No additional relevant information.  Plan of Care  Orders:  Orders Placed This Encounter  Procedures  . LUMBAR FACET(MEDIAL BRANCH NERVE BLOCK) MBNB    Scheduling Instructions:     Procedure: Lumbar facet block (AKA.: Lumbosacral medial branch nerve block)     Side: Right-sided     Level: L3-4, L4-5, & L5-S1 Facets (L2, L3, L4, L5, & S1 Medial Branch Nerves)     Sedation: Patient's choice.     Timeframe: Today    Order Specific Question:   Where will this procedure be performed?    Answer:   [facility]  . [facility] C-ARM 1-60 MIN NO REPORT    Intraoperative interpretation by procedural physician at [facility].    Standing Status:   Standing    Number of Occurrences:   1    Order Specific Question:   Reason for exam:    Answer:   Assistance in needle guidance and placement for procedures requiring needle placement in or near specific anatomical locations not easily accessible without such assistance.  . Informed Consent Details: Physician/Practitioner Attestation; Transcribe to consent form and obtain patient signature    Nursing Order: Transcribe to consent form and obtain patient signature. Note: Always confirm laterality of pain with Ms. Wolfert, before procedure. Procedure: Lumbar Facet Block  under fluoroscopic guidance Indication/Reason: Low Back Pain, with our without leg pain, due to Facet Joint Arthralgia (Joint Pain) known as Lumbar Facet  Syndrome, secondary to Lumbar, and/or Lumbosacral Spondylosis (Arthritis of the Spine), without myelopathy or radiculopathy (Nerve Damage). Provider Attestation: I, Aleneva Dossie Arbour, MD, (Pain Management Specialist), the physician/practitioner, attest that I have discussed with the patient the benefits, risks, side effects, alternatives, likelihood of achieving goals and potential problems during recovery for the procedure that I have provided informed consent.  . Provide equipment / supplies at bedside    Equipment required: Single use, disposable, "Block Tray"    Standing Status:   Standing    Number of Occurrences:   1    Order Specific Question:   Specify    Answer:   Block Tray   Chronic Opioid Analgesic:  No opioid analgesics prescribed by our practice.  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 MME/day:0 mg/day.   Medications ordered for procedure: Meds ordered this encounter  Medications  . lidocaine (XYLOCAINE) 2 % (with pres) injection 400 mg  . lactated ringers infusion 1,000 mL  . midazolam (VERSED) 5 MG/5ML injection 1-2 mg    Make sure Flumazenil is available in the pyxis when using this medication. If oversedation occurs, administer 0.2 mg IV over 15 sec. If after 45 sec no response, administer 0.2 mg again over 1 min; may repeat at 1 min intervals; not to exceed 4 doses (1 mg)  . fentaNYL (SUBLIMAZE) injection 25-50 mcg    Make sure Narcan is available in the pyxis when using this medication. In the event of respiratory depression (RR< 8/min): Titrate NARCAN (naloxone) in increments of 0.1 to 0.2 mg IV at 2-3 minute intervals, until desired degree of reversal.  . ropivacaine (PF) 2 mg/mL (0.2%) (NAROPIN) injection 9 mL  . triamcinolone acetonide (KENALOG-40) injection 40 mg   Medications administered: We administered lidocaine, lactated ringers, midazolam, fentaNYL, ropivacaine  (  PF) 2 mg/mL (0.2%), and triamcinolone acetonide.  See the medical record for exact dosing, route, and time of administration.  Follow-up plan:   Return in 2 weeks (on 10/05/2019) for (VV), (PP) Follow-up.       Interventional management options:  Considering: Diagnostic bilateral intra-articular hip injection. Diagnostic/therapeutic right L2-3 LESI #2 Diagnostic/therapeutic L1-2 vs L4-5LESI. Diagnostic/therapeutic bilateral L4-5 TFESI.   Palliative PRN treatment(s): Diagnostic bilateral intra-articular hip injection. Palliativeleftlumbar facetRFA #4(Last done: 09/15/2018) Palliativerightlumbar facetRFA #2(Last done: 06/06/2016) Palliative bilateral lumbar facet blocks  Palliativeleft sacroiliac joint block. Palliativeleft sacroiliac jointRFA #3(Last done: 07/10/2017) Palliative left-sided L4 & L5 TFESI #3  Palliative right-sided L1-2 LESI #2 + left sided L4 transforaminal ESI #3    Recent Visits Date Type Provider Dept  09/21/19 Procedure visit Milinda Pointer, MD [facility]  09/06/19 Office Visit Milinda Pointer, MD [facility]  Showing recent visits within past 90 days and meeting all other requirements Future Appointments Date Type Provider Dept  10/06/19 Appointment Milinda Pointer, MD [facility]  Showing future appointments within next 90 days and meeting all other requirements  Disposition: Discharge home  Discharge (Date  Time): 09/21/2019; 1008 hrs.   Primary Care Physician: Randel Books, FNP Location: [facility] Note by: Gaspar Cola, MD Date: 09/21/2019; Time: 8:02 AM  Disclaimer:  Medicine is not an Chief Strategy Officer. The only guarantee in medicine is that nothing is guaranteed. It is important to note that the decision to proceed with this intervention was based on the information collected from the patient. The Data and conclusions were drawn from the patient's  questionnaire, the interview, and the physical examination. Because the information was provided in large part by the patient, it cannot be guaranteed that it has not been purposely or unconsciously manipulated. Every effort has been made to obtain as much relevant data as possible for this evaluation. It is important to note that the conclusions that lead to this procedure are derived in large part from the available data. Always take into account that the treatment will also be dependent on availability of resources and existing treatment guidelines, considered by other Pain Management Practitioners as being common knowledge and practice, at the time of the intervention. For Medico-Legal purposes, it is also important to point out that variation in procedural techniques and pharmacological choices are the acceptable norm. The indications, contraindications, technique, and results of the above procedure should only be interpreted and judged by a Board-Certified Interventional Pain Specialist with extensive familiarity and expertise in the same exact procedure and technique.

## 2019-09-21 ENCOUNTER — Ambulatory Visit
Admission: RE | Admit: 2019-09-21 | Discharge: 2019-09-21 | Disposition: A | Discharge status: Home / Self Care | Payer: Medicare HMO | Source: Ambulatory Visit | Attending: Pain Medicine | Admitting: Pain Medicine

## 2019-09-21 ENCOUNTER — Encounter: Payer: Self-pay | Admitting: Pain Medicine

## 2019-09-21 ENCOUNTER — Other Ambulatory Visit: Payer: Self-pay

## 2019-09-21 ENCOUNTER — Ambulatory Visit (HOSPITAL_BASED_OUTPATIENT_CLINIC_OR_DEPARTMENT_OTHER): Payer: Medicare HMO | Admitting: Pain Medicine

## 2019-09-21 VITALS — BP 134/78 | HR 68 | Temp 96.6°F | Resp 17 | Ht 61.0 in | Wt 180.0 lb

## 2019-09-21 DIAGNOSIS — M47817 Spondylosis without myelopathy or radiculopathy, lumbosacral region: Secondary | ICD-10-CM

## 2019-09-21 DIAGNOSIS — M47816 Spondylosis without myelopathy or radiculopathy, lumbar region: Secondary | ICD-10-CM | POA: Insufficient documentation

## 2019-09-21 DIAGNOSIS — M5136 Other intervertebral disc degeneration, lumbar region: Secondary | ICD-10-CM | POA: Diagnosis present

## 2019-09-21 DIAGNOSIS — G8929 Other chronic pain: Secondary | ICD-10-CM | POA: Insufficient documentation

## 2019-09-21 DIAGNOSIS — M545 Low back pain: Secondary | ICD-10-CM | POA: Insufficient documentation

## 2019-09-21 DIAGNOSIS — M4310 Spondylolisthesis, site unspecified: Secondary | ICD-10-CM

## 2019-09-21 MED ORDER — TRIAMCINOLONE ACETONIDE 40 MG/ML IJ SUSP
40.0000 mg | Freq: Once | INTRAMUSCULAR | Status: AC
  Administered 2019-09-21: 40 mg
  Filled 2019-09-21: qty 1

## 2019-09-21 MED ORDER — LACTATED RINGERS IV SOLN
1000.0000 mL | Freq: Once | INTRAVENOUS | Status: AC
  Administered 2019-09-21: 1000 mL via INTRAVENOUS

## 2019-09-21 MED ORDER — ROPIVACAINE HCL 2 MG/ML IJ SOLN
9.0000 mL | Freq: Once | INTRAMUSCULAR | Status: AC
  Administered 2019-09-21: 9 mL via PERINEURAL
  Filled 2019-09-21: qty 10

## 2019-09-21 MED ORDER — FENTANYL CITRATE (PF) 100 MCG/2ML IJ SOLN
25.0000 ug | INTRAMUSCULAR | Status: AC | PRN
  Administered 2019-09-21: 100 ug via INTRAVENOUS
  Filled 2019-09-21: qty 2

## 2019-09-21 MED ORDER — MIDAZOLAM HCL 5 MG/5ML IJ SOLN
1.0000 mg | INTRAMUSCULAR | Status: AC | PRN
  Administered 2019-09-21: 3 mg via INTRAVENOUS
  Filled 2019-09-21: qty 5

## 2019-09-21 MED ORDER — LIDOCAINE HCL 2 % IJ SOLN
20.0000 mL | Freq: Once | INTRAMUSCULAR | Status: AC
  Administered 2019-09-21: 400 mg

## 2019-09-21 NOTE — Progress Notes (Signed)
Safety precautions to be maintained throughout the outpatient stay will include: orient to surroundings, keep bed in low position, maintain call bell within reach at all times, provide assistance with transfer out of bed and ambulation.  

## 2019-09-22 ENCOUNTER — Telehealth: Payer: Self-pay | Admitting: *Deleted

## 2019-09-22 NOTE — Telephone Encounter (Signed)
No problems post procedure. 

## 2019-10-02 ENCOUNTER — Encounter: Payer: Self-pay | Admitting: Pain Medicine

## 2019-10-05 ENCOUNTER — Encounter: Payer: Self-pay | Admitting: Pain Medicine
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
## 2019-10-06 ENCOUNTER — Ambulatory Visit: Payer: Medicare Other | Attending: Pain Medicine | Admitting: Pain Medicine

## 2019-10-06 ENCOUNTER — Other Ambulatory Visit: Payer: Self-pay

## 2019-10-06 DIAGNOSIS — M545 Low back pain, unspecified: Secondary | ICD-10-CM

## 2019-10-06 DIAGNOSIS — G894 Chronic pain syndrome: Secondary | ICD-10-CM

## 2019-10-06 DIAGNOSIS — M47816 Spondylosis without myelopathy or radiculopathy, lumbar region: Secondary | ICD-10-CM | POA: Diagnosis not present

## 2019-10-06 DIAGNOSIS — G8929 Other chronic pain: Secondary | ICD-10-CM | POA: Diagnosis not present

## 2019-10-18 ENCOUNTER — Telehealth: Payer: Self-pay

## 2019-10-18 NOTE — Telephone Encounter (Signed)
Correction- it was not a bill but an EOB she got. I told her not to worry about it, I got the Loop for [phone] and [phone] so she should be covered. But I would like to know what codes were used for billing.

## 2019-10-18 NOTE — Telephone Encounter (Signed)
He charged for 3 levels.  [phone]

## 2019-10-18 NOTE — Telephone Encounter (Signed)
She got a bill for her procedure 09/14/19 and insurance is not paying because they are saying the doctor billed a different code that what authorization was gotten for. I got auth for [phone] and [phone]. Most insurances  will only pay for 2 levels of facets now. Can someone look into this and see what CPT code he used?

## 2019-10-20 NOTE — Telephone Encounter (Signed)
We need to remind him that most insurance companies are only paying for 2 levels, that's all they will approve. I will forward this to [facility]

## 2019-11-03 ENCOUNTER — Other Ambulatory Visit: Payer: Self-pay | Admitting: Pain Medicine

## 2019-11-03 DIAGNOSIS — M797 Fibromyalgia: Secondary | ICD-10-CM

## 2019-11-03 DIAGNOSIS — M792 Neuralgia and neuritis, unspecified: Secondary | ICD-10-CM

## 2019-11-04 NOTE — Telephone Encounter (Signed)
No, they will not approve all three levels anymore. I only request [phone] and [phone]. If I request [phone] with it they will automatically deny. I let everyone know around the first of the year that they are only approving 2 levels now.

## 2019-12-01 ENCOUNTER — Encounter: Payer: Self-pay | Admitting: Radiology

## 2019-12-27 ENCOUNTER — Encounter: Payer: Self-pay | Admitting: Obstetrics & Gynecology

## 2019-12-27 ENCOUNTER — Other Ambulatory Visit: Payer: Self-pay

## 2019-12-27 ENCOUNTER — Ambulatory Visit (INDEPENDENT_AMBULATORY_CARE_PROVIDER_SITE_OTHER): Payer: Medicare Other | Admitting: Obstetrics & Gynecology

## 2019-12-27 VITALS — BP 143/84 | HR 76 | Wt 185.4 lb

## 2019-12-27 DIAGNOSIS — N8110 Cystocele, unspecified: Secondary | ICD-10-CM

## 2019-12-27 DIAGNOSIS — Z87440 Personal history of urinary (tract) infections: Secondary | ICD-10-CM | POA: Diagnosis not present

## 2019-12-27 DIAGNOSIS — R32 Unspecified urinary incontinence: Secondary | ICD-10-CM | POA: Diagnosis not present

## 2019-12-27 DIAGNOSIS — N952 Postmenopausal atrophic vaginitis: Secondary | ICD-10-CM

## 2019-12-27 LAB — POCT URINALYSIS DIPSTICK
Bilirubin, UA: NEGATIVE
Blood, UA: NEGATIVE
Glucose, UA: NEGATIVE
Ketones, UA: NEGATIVE
Leukocytes, UA: NEGATIVE
Nitrite, UA: NEGATIVE
Protein, UA: NEGATIVE
Spec Grav, UA: 1.02 (ref 1.010–1.025)
Urobilinogen, UA: 0.2 E.U./dL
pH, UA: 7.5 (ref 5.0–8.0)

## 2019-12-27 MED ORDER — PREMARIN 0.625 MG/GM VA CREA: 1.0000 | TOPICAL_CREAM | Freq: Every evening | VAGINAL | 5 refills | Status: DC | PRN
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
## 2019-12-29 ENCOUNTER — Telehealth: Payer: Self-pay | Admitting: *Deleted

## 2019-12-29 MED ORDER — ESTRADIOL 0.1 MG/GM VA CREA: 1.0000 | TOPICAL_CREAM | VAGINAL | 2 refills | Status: AC

## 2019-12-29 NOTE — Telephone Encounter (Signed)
Pt called stating her premarin cream was $131 and is unable to afford that. Will ask Dr a about sending in another RX. I also found a copay card on line and will mail it to the patient.

## 2020-01-01 DIAGNOSIS — M47812 Spondylosis without myelopathy or radiculopathy, cervical region: Secondary | ICD-10-CM | POA: Insufficient documentation

## 2020-01-01 DIAGNOSIS — D649 Anemia, unspecified: Secondary | ICD-10-CM

## 2020-01-01 DIAGNOSIS — F411 Generalized anxiety disorder: Secondary | ICD-10-CM | POA: Insufficient documentation

## 2020-01-01 DIAGNOSIS — F32A Depression, unspecified: Secondary | ICD-10-CM | POA: Insufficient documentation

## 2020-03-08 DIAGNOSIS — E7849 Other hyperlipidemia: Secondary | ICD-10-CM | POA: Diagnosis not present

## 2020-03-08 DIAGNOSIS — E039 Hypothyroidism, unspecified: Secondary | ICD-10-CM | POA: Diagnosis not present

## 2020-03-21 DIAGNOSIS — G319 Degenerative disease of nervous system, unspecified: Secondary | ICD-10-CM | POA: Diagnosis not present

## 2020-03-21 DIAGNOSIS — Z79899 Other long term (current) drug therapy: Secondary | ICD-10-CM | POA: Diagnosis not present

## 2020-03-21 DIAGNOSIS — J449 Chronic obstructive pulmonary disease, unspecified: Secondary | ICD-10-CM | POA: Diagnosis not present

## 2020-03-21 DIAGNOSIS — F418 Other specified anxiety disorders: Secondary | ICD-10-CM | POA: Diagnosis not present

## 2020-03-21 DIAGNOSIS — I10 Essential (primary) hypertension: Secondary | ICD-10-CM | POA: Diagnosis not present

## 2020-03-21 DIAGNOSIS — E782 Mixed hyperlipidemia: Secondary | ICD-10-CM | POA: Diagnosis not present

## 2020-03-21 DIAGNOSIS — G4700 Insomnia, unspecified: Secondary | ICD-10-CM | POA: Diagnosis not present

## 2020-03-21 DIAGNOSIS — E039 Hypothyroidism, unspecified: Secondary | ICD-10-CM | POA: Diagnosis not present

## 2020-03-23 DIAGNOSIS — Z6837 Body mass index (BMI) 37.0-37.9, adult: Secondary | ICD-10-CM | POA: Diagnosis not present

## 2020-03-23 DIAGNOSIS — N390 Urinary tract infection, site not specified: Secondary | ICD-10-CM | POA: Diagnosis not present

## 2020-03-27 DIAGNOSIS — J449 Chronic obstructive pulmonary disease, unspecified: Secondary | ICD-10-CM | POA: Diagnosis not present

## 2020-03-27 DIAGNOSIS — E119 Type 2 diabetes mellitus without complications: Secondary | ICD-10-CM | POA: Diagnosis not present

## 2020-03-27 DIAGNOSIS — I10 Essential (primary) hypertension: Secondary | ICD-10-CM | POA: Diagnosis not present

## 2020-03-30 ENCOUNTER — Ambulatory Visit: Payer: Medicare Other | Admitting: Dermatology

## 2020-03-30 ENCOUNTER — Other Ambulatory Visit: Payer: Self-pay

## 2020-03-30 ENCOUNTER — Encounter: Payer: Self-pay | Admitting: Dermatology

## 2020-03-30 DIAGNOSIS — D692 Other nonthrombocytopenic purpura: Secondary | ICD-10-CM | POA: Diagnosis not present

## 2020-03-30 DIAGNOSIS — L821 Other seborrheic keratosis: Secondary | ICD-10-CM | POA: Diagnosis not present

## 2020-03-30 DIAGNOSIS — L578 Other skin changes due to chronic exposure to nonionizing radiation: Secondary | ICD-10-CM

## 2020-03-30 DIAGNOSIS — L820 Inflamed seborrheic keratosis: Secondary | ICD-10-CM | POA: Diagnosis not present

## 2020-03-30 DIAGNOSIS — L570 Actinic keratosis: Secondary | ICD-10-CM

## 2020-03-30 NOTE — Patient Instructions (Signed)
Cryotherapy Aftercare  . Wash gently with soap and water everyday.   . Apply Vaseline and Band-Aid daily until healed.  

## 2020-03-30 NOTE — Progress Notes (Signed)
New Patient Visit  Subjective  [name] is a 75 y.o. female who presents for the following: Spot Check (Pt has a few spots on her face that she would like checked today. No hx of skin cancer or dysplastic nevi. ).  Objective  Well appearing patient in no apparent distress; mood and affect are within normal limits.  A focused examination was performed including face, chest, neck. Relevant physical exam findings are noted in the Assessment and Plan.  Objective  left lateral brow x 2, left cheek x 1, left distal dorsum nose x 1 (4): Erythematous thin papules/macules with gritty scale.   Objective  left cheek x 1, left shoulder x 1, chest x 2, right shoulder x 1, right abdomen x 1 (6): Erythematous keratotic or waxy stuck-on papule or plaque.   Assessment & Plan  AK (actinic keratosis) (4) left lateral brow x 2, left cheek x 1, left distal dorsum nose x 1 Prior to procedure, discussed risks of blister formation, small wound, skin dyspigmentation, or rare scar following cryotherapy.  Destruction of lesion - left lateral brow x 2, left cheek x 1, left distal dorsum nose x 1 Complexity: simple   Destruction method: cryotherapy   Informed consent: discussed and consent obtained   Timeout:  patient name, date of birth, surgical site, and procedure verified Lesion destroyed using liquid nitrogen: Yes   Region frozen until ice ball extended beyond lesion: Yes   Outcome: patient tolerated procedure well with no complications   Post-procedure details: wound care instructions given    Inflamed seborrheic keratosis (6) left cheek x 1, left shoulder x 1, chest x 2, right shoulder x 1, right abdomen x 1 Prior to procedure, discussed risks of blister formation, small wound, skin dyspigmentation, or rare scar following cryotherapy. Destruction of lesion - left cheek x 1, left shoulder x 1, chest x 2, right shoulder x 1, right abdomen x 1 Complexity: simple   Destruction method:  cryotherapy   Informed consent: discussed and consent obtained   Timeout:  patient name, date of birth, surgical site, and procedure verified Lesion destroyed using liquid nitrogen: Yes   Region frozen until ice ball extended beyond lesion: Yes   Outcome: patient tolerated procedure well with no complications   Post-procedure details: wound care instructions given    Seborrheic Keratoses - Stuck-on, waxy, tan-brown papules and plaques  - Discussed benign etiology and prognosis. - Observe - Call for any changes  Actinic Damage - chronic, secondary to cumulative UV radiation exposure/sun exposure over time - diffuse scaly erythematous macules with underlying dyspigmentation - Recommend daily broad spectrum sunscreen SPF 30+ to sun-exposed areas, reapply every 2 hours as needed.  - Recommend staying in the shade or wearing long sleeves, sun glasses (UVA+UVB protection) and wide brim hats (4-inch brim around the entire circumference of the hat). - Call for new or changing lesions.  Purpura - Chronic; persistent and recurrent.  Treatable, but not curable. - Violaceous macules and patches - Benign - Related to trauma, age, sun damage and/or use of blood thinners, chronic use of topical and/or oral steroids - Observe - Can use OTC arnica containing moisturizer such as Dermend Bruise Formula if desired - Call for worsening or other concerns  Return in about 2 months (around 05/30/2020) for AK f/u.   IHarriett Sine, CMA, am acting as scribe for Sarina Ser, MD.  Documentation: I have reviewed the above documentation for accuracy and completeness, and I agree with the  above.  Sarina Ser, MD

## 2020-04-06 DIAGNOSIS — N390 Urinary tract infection, site not specified: Secondary | ICD-10-CM | POA: Diagnosis not present

## 2020-05-30 DIAGNOSIS — H1045 Other chronic allergic conjunctivitis: Secondary | ICD-10-CM | POA: Diagnosis not present

## 2020-05-30 DIAGNOSIS — H2510 Age-related nuclear cataract, unspecified eye: Secondary | ICD-10-CM | POA: Diagnosis not present

## 2020-05-30 DIAGNOSIS — H353131 Nonexudative age-related macular degeneration, bilateral, early dry stage: Secondary | ICD-10-CM | POA: Diagnosis not present

## 2020-05-30 DIAGNOSIS — H35039 Hypertensive retinopathy, unspecified eye: Secondary | ICD-10-CM | POA: Diagnosis not present

## 2020-06-01 ENCOUNTER — Ambulatory Visit: Payer: Medicare HMO | Admitting: Dermatology

## 2020-06-20 DIAGNOSIS — R7303 Prediabetes: Secondary | ICD-10-CM | POA: Diagnosis not present

## 2020-06-20 DIAGNOSIS — Z79899 Other long term (current) drug therapy: Secondary | ICD-10-CM | POA: Diagnosis not present

## 2020-06-20 DIAGNOSIS — E039 Hypothyroidism, unspecified: Secondary | ICD-10-CM | POA: Diagnosis not present

## 2020-06-20 DIAGNOSIS — E782 Mixed hyperlipidemia: Secondary | ICD-10-CM | POA: Diagnosis not present

## 2020-06-20 DIAGNOSIS — Z780 Asymptomatic menopausal state: Secondary | ICD-10-CM | POA: Diagnosis not present

## 2020-06-20 DIAGNOSIS — R42 Dizziness and giddiness: Secondary | ICD-10-CM | POA: Diagnosis not present

## 2020-06-20 DIAGNOSIS — J309 Allergic rhinitis, unspecified: Secondary | ICD-10-CM | POA: Diagnosis not present

## 2020-06-20 DIAGNOSIS — I10 Essential (primary) hypertension: Secondary | ICD-10-CM | POA: Diagnosis not present

## 2020-06-20 DIAGNOSIS — G4700 Insomnia, unspecified: Secondary | ICD-10-CM | POA: Diagnosis not present

## 2020-06-20 DIAGNOSIS — N390 Urinary tract infection, site not specified: Secondary | ICD-10-CM | POA: Diagnosis not present

## 2020-06-28 ENCOUNTER — Other Ambulatory Visit: Payer: Self-pay | Admitting: Family Medicine

## 2020-06-28 DIAGNOSIS — Z1231 Encounter for screening mammogram for malignant neoplasm of breast: Secondary | ICD-10-CM

## 2020-06-29 DIAGNOSIS — Z0000 Encounter for general adult medical examination without abnormal findings: Secondary | ICD-10-CM | POA: Diagnosis not present

## 2020-06-29 DIAGNOSIS — E785 Hyperlipidemia, unspecified: Secondary | ICD-10-CM | POA: Diagnosis not present

## 2020-06-29 DIAGNOSIS — Z9181 History of falling: Secondary | ICD-10-CM | POA: Diagnosis not present

## 2020-07-03 ENCOUNTER — Other Ambulatory Visit: Payer: Self-pay | Admitting: Family Medicine

## 2020-07-03 DIAGNOSIS — E2839 Other primary ovarian failure: Secondary | ICD-10-CM

## 2020-07-17 DIAGNOSIS — H2512 Age-related nuclear cataract, left eye: Secondary | ICD-10-CM | POA: Diagnosis not present

## 2020-07-17 DIAGNOSIS — H524 Presbyopia: Secondary | ICD-10-CM | POA: Diagnosis not present

## 2020-07-17 DIAGNOSIS — H2513 Age-related nuclear cataract, bilateral: Secondary | ICD-10-CM | POA: Diagnosis not present

## 2020-07-17 DIAGNOSIS — H35033 Hypertensive retinopathy, bilateral: Secondary | ICD-10-CM | POA: Diagnosis not present

## 2020-07-17 DIAGNOSIS — H353131 Nonexudative age-related macular degeneration, bilateral, early dry stage: Secondary | ICD-10-CM | POA: Diagnosis not present

## 2020-07-17 DIAGNOSIS — H25013 Cortical age-related cataract, bilateral: Secondary | ICD-10-CM | POA: Diagnosis not present

## 2020-07-25 DIAGNOSIS — H2512 Age-related nuclear cataract, left eye: Secondary | ICD-10-CM | POA: Diagnosis not present

## 2020-07-25 DIAGNOSIS — H25812 Combined forms of age-related cataract, left eye: Secondary | ICD-10-CM | POA: Diagnosis not present

## 2020-08-03 ENCOUNTER — Other Ambulatory Visit: Payer: Self-pay

## 2020-08-03 ENCOUNTER — Ambulatory Visit
Admission: RE | Admit: 2020-08-03 | Discharge: 2020-08-03 | Disposition: A | Discharge status: Home / Self Care | Payer: Medicare Other | Source: Ambulatory Visit | Attending: Family Medicine | Admitting: Family Medicine

## 2020-08-03 DIAGNOSIS — M810 Age-related osteoporosis without current pathological fracture: Secondary | ICD-10-CM | POA: Diagnosis not present

## 2020-08-03 DIAGNOSIS — E2839 Other primary ovarian failure: Secondary | ICD-10-CM | POA: Insufficient documentation

## 2020-08-03 DIAGNOSIS — Z1231 Encounter for screening mammogram for malignant neoplasm of breast: Secondary | ICD-10-CM | POA: Diagnosis not present

## 2020-08-04 DIAGNOSIS — H2512 Age-related nuclear cataract, left eye: Secondary | ICD-10-CM | POA: Diagnosis not present

## 2020-08-14 DIAGNOSIS — H2511 Age-related nuclear cataract, right eye: Secondary | ICD-10-CM | POA: Diagnosis not present

## 2020-08-14 DIAGNOSIS — H25011 Cortical age-related cataract, right eye: Secondary | ICD-10-CM | POA: Diagnosis not present

## 2020-08-22 DIAGNOSIS — H2511 Age-related nuclear cataract, right eye: Secondary | ICD-10-CM | POA: Diagnosis not present

## 2020-08-22 DIAGNOSIS — H25011 Cortical age-related cataract, right eye: Secondary | ICD-10-CM | POA: Diagnosis not present

## 2020-08-22 DIAGNOSIS — H25811 Combined forms of age-related cataract, right eye: Secondary | ICD-10-CM | POA: Diagnosis not present

## 2020-08-29 DIAGNOSIS — H2511 Age-related nuclear cataract, right eye: Secondary | ICD-10-CM | POA: Diagnosis not present

## 2020-09-21 DIAGNOSIS — J45909 Unspecified asthma, uncomplicated: Secondary | ICD-10-CM | POA: Diagnosis not present

## 2020-09-21 DIAGNOSIS — E782 Mixed hyperlipidemia: Secondary | ICD-10-CM | POA: Diagnosis not present

## 2020-09-21 DIAGNOSIS — M810 Age-related osteoporosis without current pathological fracture: Secondary | ICD-10-CM | POA: Diagnosis not present

## 2020-09-21 DIAGNOSIS — R7303 Prediabetes: Secondary | ICD-10-CM | POA: Diagnosis not present

## 2020-09-21 DIAGNOSIS — Z79899 Other long term (current) drug therapy: Secondary | ICD-10-CM | POA: Diagnosis not present

## 2020-09-21 DIAGNOSIS — E039 Hypothyroidism, unspecified: Secondary | ICD-10-CM | POA: Diagnosis not present

## 2020-09-21 DIAGNOSIS — J449 Chronic obstructive pulmonary disease, unspecified: Secondary | ICD-10-CM | POA: Diagnosis not present

## 2020-11-04 DIAGNOSIS — J209 Acute bronchitis, unspecified: Secondary | ICD-10-CM | POA: Diagnosis not present

## 2020-11-04 DIAGNOSIS — J069 Acute upper respiratory infection, unspecified: Secondary | ICD-10-CM | POA: Diagnosis not present

## 2020-11-13 DIAGNOSIS — J209 Acute bronchitis, unspecified: Secondary | ICD-10-CM | POA: Diagnosis not present

## 2020-12-13 DIAGNOSIS — Z139 Encounter for screening, unspecified: Secondary | ICD-10-CM | POA: Diagnosis not present

## 2020-12-13 DIAGNOSIS — Z79899 Other long term (current) drug therapy: Secondary | ICD-10-CM | POA: Diagnosis not present

## 2020-12-13 DIAGNOSIS — G4700 Insomnia, unspecified: Secondary | ICD-10-CM | POA: Diagnosis not present

## 2020-12-13 DIAGNOSIS — E039 Hypothyroidism, unspecified: Secondary | ICD-10-CM | POA: Diagnosis not present

## 2020-12-18 DIAGNOSIS — R6889 Other general symptoms and signs: Secondary | ICD-10-CM | POA: Diagnosis not present

## 2020-12-25 DIAGNOSIS — R059 Cough, unspecified: Secondary | ICD-10-CM | POA: Diagnosis not present

## 2020-12-25 DIAGNOSIS — J209 Acute bronchitis, unspecified: Secondary | ICD-10-CM | POA: Diagnosis not present

## 2021-01-23 ENCOUNTER — Other Ambulatory Visit: Payer: Self-pay | Admitting: Obstetrics & Gynecology

## 2021-01-23 DIAGNOSIS — N952 Postmenopausal atrophic vaginitis: Secondary | ICD-10-CM

## 2021-02-06 DIAGNOSIS — H35039 Hypertensive retinopathy, unspecified eye: Secondary | ICD-10-CM | POA: Diagnosis not present

## 2021-02-06 DIAGNOSIS — H1045 Other chronic allergic conjunctivitis: Secondary | ICD-10-CM | POA: Diagnosis not present

## 2021-02-06 DIAGNOSIS — H353131 Nonexudative age-related macular degeneration, bilateral, early dry stage: Secondary | ICD-10-CM | POA: Diagnosis not present

## 2021-03-16 DIAGNOSIS — I10 Essential (primary) hypertension: Secondary | ICD-10-CM | POA: Diagnosis not present

## 2021-03-16 DIAGNOSIS — N390 Urinary tract infection, site not specified: Secondary | ICD-10-CM | POA: Diagnosis not present

## 2021-03-16 DIAGNOSIS — R7303 Prediabetes: Secondary | ICD-10-CM | POA: Diagnosis not present

## 2021-03-16 DIAGNOSIS — Z79899 Other long term (current) drug therapy: Secondary | ICD-10-CM | POA: Diagnosis not present

## 2021-03-16 DIAGNOSIS — G319 Degenerative disease of nervous system, unspecified: Secondary | ICD-10-CM | POA: Diagnosis not present

## 2021-03-16 DIAGNOSIS — E039 Hypothyroidism, unspecified: Secondary | ICD-10-CM | POA: Diagnosis not present

## 2021-03-16 DIAGNOSIS — E782 Mixed hyperlipidemia: Secondary | ICD-10-CM | POA: Diagnosis not present

## 2021-04-16 DIAGNOSIS — R6889 Other general symptoms and signs: Secondary | ICD-10-CM | POA: Diagnosis not present

## 2021-04-16 DIAGNOSIS — J069 Acute upper respiratory infection, unspecified: Secondary | ICD-10-CM | POA: Diagnosis not present

## 2021-04-22 NOTE — Progress Notes (Signed)
PROVIDER NOTE: Information contained herein reflects review and annotations entered in association with encounter. Interpretation of such information and data should be left to medically-trained personnel. Information provided to patient can be located elsewhere in the medical record under "Patient Instructions". Document created using STT-dictation technology, any transcriptional errors that may result from process are unintentional.  ?  ?Patient: [name]  Service Category: E/M  Provider: Gaspar Cola, MD  ?DOB: 1945/03/08  DOS: 04/23/2021  Specialty: Interventional Pain Management  ?MRN: [id number]  Setting: Ambulatory outpatient  PCP: Leonides Sake, MD  ?Type: Established Patient    Referring Provider: Leonides Sake, MD  ?Location: Office  Delivery: Face-to-face    ? ?HPI  ?Ms. CARRI SPILLERS, a 76 y.o. year old female, is here today because of her Chronic bilateral low back pain without sciatica [M54.50, G89.29]. [name]'s primary complain today is Back Pain (low) ?Last encounter: My last encounter with her was on Visit date not found. ?Pertinent problems: [name] has Chronic pain syndrome; Chronic low back pain (1ry area of Pain) (Bilateral) (L>R); Failed back surgical syndrome; Postlaminectomy syndrome, lumbar region; Presence of functional implant ([employer] lumbar spinal cord stimulator); Lumbar spondylosis; Lumbar facet syndrome (Bilateral) (L>R); Lumbar facet arthropathy ([place]); Lumbar facet hypertrophy (L4-5); Lumbar foraminal stenosis (bilateral L4-5); Lower extremity pain (Left); Chronic radicular lumbar pain (Left); Trochanteric bursitis of hip (Left); Chronic hip pain (bilateral); Chronic sacroiliac joint pain (Left); Fibromyalgia; Restless leg syndrome; Chronic lower extremity pain (2ry area of Pain) (Left); Neurogenic pain; Chronic musculoskeletal pain; Muscle spasm of back; Lumbar spine instability (L4-5); Lumbar Anterolisthesis at L4-5 (8-11 mm w/ dynamic  instability) and L5-S1 (2 mm) (Stable); Myofascial pain; DDD (degenerative disc disease), lumbar; Spondylosis without myelopathy or radiculopathy, lumbosacral region; Other specified dorsopathies, sacral and sacrococcygeal region; Chronic hip pain (Right); Osteoarthritis of hip (Right); Chronic pain of right knee; Lumbar spinal stenosis (L4-5 and L1-2) w/ claudication; and Left-sided weakness on their pertinent problem list. ?Pain Assessment: Severity of Chronic pain is reported as a 8 /10. Location: Back Lower, Mid/ . Onset: More than a month ago. Quality: Aching, Sharp, Dull. Timing: Intermittent. Modifying factor(s): rest, topicals, heat, ice. ?Vitals:  height is '5\' 1"'$  (1.549 m) and weight is 203 lb 9.6 oz (92.4 kg). Her temporal temperature is 97.8 ?F (36.6 ?C). Her blood pressure is 179/75 (abnormal) and her pulse is 78. Her respiration is 18 and oxygen saturation is 98%.  ? ?Reason for encounter: follow-up evaluation for possible replacement on her spinal cord stimulator battery.  The patient returns after last time being seen on 10/06/2019.  The patient comes into the clinics indicating that she has been experiencing more pain in the lower back (L>R).  She was hospitalized for a while due to a COVID infection that significantly affected her breathing.  She was also found to be hypothyroid.  She has gained quite a bit of weight since the last time that I saw her.  Today I have reminded her that it is important to keep the BMI close to 30 so that this will minimally impact her Lumbar DJD.  She currently has a BMI of 38.47 kg/m?. ? ?She states that her spinal cord stimulator still working, but not as well as it used to.  The device is approximately 75 years old and she is wondering if the battery needs to be changed.  Analysis of the battery today demonstrated the battery to still be working.  We will consult the [employer] representative for  further evaluation of the device so as to determine if the battery  needs to be changed or if we can wipe the battery clean and restart it so as to eliminate the possible issue with memory. ? ?Meanwhile, the patient states that she is having an increase in her low back pain with the left being worse than the right.  Physical exam today demonstrated the pain to be coming from the facet joints with positive ipsilateral facet joint arthralgia on hyperextension and rotation maneuver and Kemp maneuver.  In the past we have done lumbar facet blocks for this patient and we have also done radiofrequency ablation.  The last time that we did radiofrequency ablation on the right side was in 2018 and on the left side was in 2020.  It seems that it has lasted close to 5 years on the right side and 3 years on the left.  At this point we will assist this by doing a repeat diagnostic bilateral lumbar facet block. ? ?The patient has requested some pain medicine to deal with the discomfort until we can get her into the schedule.  I have provided her with 15 days worth of tramadol 50 mg, 1 tab p.o. 4 times daily as needed for pain.  According to the medical record she is allergic to aspirin, morphine, hydrocodone, Naima seen, and penicillin.  However, she refers that she takes 81 mg of aspirin every day.  Looking at the historical electronic medical record, in the past I used to prescribe oxycodone/APAP 5/325 for this patient.  She has not taken that in several years and currently she is 76 years old.  She does take Ambien 5 mg regularly as well as alprazolam (Xanax) 1 mg tablet twice daily. ? ?Pharmacotherapy Assessment  ?Analgesic: No opioid analgesics prescribed by our practice.  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 ?MME/day: 0 mg/day.  ? ?Monitoring: ?[place] PMP: PDMP reviewed during this encounter.       ?Pharmacotherapy: No side-effects or adverse reactions reported. ?Compliance: No problems  identified. ?Effectiveness: Clinically acceptable. ? ?Hart Rochester RN  04/23/2021 12:47 PM  Signed ?Safety precautions to be maintained throughout the outpatient stay will include: orient to surroundings, keep bed in low position, maintain call bell within reach at all times, provide assistance with transfer out of bed and ambulation.  ?   UDS:  ?Summary  ?Date Value Ref Range Status  ?10/30/2017 FINAL  Final  ?  Comment:  ?  ==================================================================== ?[place] ([facility]) ?==================================================================== ?Test                             Result       Flag       Units ?Drug Present and Declared for Prescription Verification ?  Alprazolam                     191          EXPECTED   ng/mg creat ?  Alpha-hydroxyalprazolam        1379         EXPECTED   ng/mg creat ?   Source of alprazolam is a scheduled prescription medication. ?   Alpha-hydroxyalprazolam is an expected metabolite of alprazolam. ?  Oxycodone  2579         EXPECTED   ng/mg creat ?  Oxymorphone                    800          EXPECTED   ng/mg creat ?  Noroxycodone                   9079         EXPECTED   ng/mg creat ?  Noroxymorphone                 240          EXPECTED   ng/mg creat ?   Sources of oxycodone are scheduled prescription medications. ?   Oxymorphone, noroxycodone, and noroxymorphone are expected ?   metabolites of oxycodone. Oxymorphone is also available as a ?   scheduled prescription medication. ?==================================================================== ?Test                      Result    Flag   Units      Ref Range ?  Creatinine              43               mg/dL      >=20 ?==================================================================== ?Declared Medications: ? The flagging and interpretation on this report are based on the ? following declared medications.  Unexpected results may arise from ? inaccuracies in  the declared medications. ? **Note: The testing scope of this panel includes these medications: ? Alprazolam ? Oxycodone ? **Note: The testing scope of this panel does not include following ? reported medications: ? A

## 2021-04-23 ENCOUNTER — Ambulatory Visit: Payer: Medicare Other | Attending: Pain Medicine | Admitting: Pain Medicine

## 2021-04-23 ENCOUNTER — Ambulatory Visit
Admission: RE | Admit: 2021-04-23 | Discharge: 2021-04-23 | Disposition: A | Discharge status: Home / Self Care | Payer: Medicare Other | Source: Ambulatory Visit | Attending: Pain Medicine | Admitting: Pain Medicine

## 2021-04-23 ENCOUNTER — Encounter: Payer: Self-pay | Admitting: Pain Medicine

## 2021-04-23 ENCOUNTER — Ambulatory Visit
Admission: RE | Admit: 2021-04-23 | Discharge: 2021-04-23 | Disposition: A | Discharge status: Home / Self Care | Payer: Medicare Other | Attending: Pain Medicine | Admitting: Pain Medicine

## 2021-04-23 ENCOUNTER — Other Ambulatory Visit: Payer: Self-pay

## 2021-04-23 VITALS — BP 179/75 | HR 78 | Temp 97.8°F | Resp 18 | Ht 61.0 in | Wt 203.6 lb

## 2021-04-23 DIAGNOSIS — M545 Low back pain, unspecified: Secondary | ICD-10-CM | POA: Diagnosis not present

## 2021-04-23 DIAGNOSIS — M47816 Spondylosis without myelopathy or radiculopathy, lumbar region: Secondary | ICD-10-CM

## 2021-04-23 DIAGNOSIS — M5136 Other intervertebral disc degeneration, lumbar region: Secondary | ICD-10-CM

## 2021-04-23 DIAGNOSIS — R200 Anesthesia of skin: Secondary | ICD-10-CM | POA: Diagnosis not present

## 2021-04-23 DIAGNOSIS — M961 Postlaminectomy syndrome, not elsewhere classified: Secondary | ICD-10-CM | POA: Insufficient documentation

## 2021-04-23 DIAGNOSIS — Z969 Presence of functional implant, unspecified: Secondary | ICD-10-CM

## 2021-04-23 DIAGNOSIS — G8929 Other chronic pain: Secondary | ICD-10-CM | POA: Insufficient documentation

## 2021-04-23 DIAGNOSIS — M47817 Spondylosis without myelopathy or radiculopathy, lumbosacral region: Secondary | ICD-10-CM | POA: Diagnosis not present

## 2021-04-23 MED ORDER — TRAMADOL HCL 50 MG PO TABS: 50.0000 mg | ORAL_TABLET | Freq: Four times a day (QID) | ORAL | 0 refills | Status: DC | PRN

## 2021-04-23 NOTE — Progress Notes (Signed)
Safety precautions to be maintained throughout the outpatient stay will include: orient to surroundings, keep bed in low position, maintain call bell within reach at all times, provide assistance with transfer out of bed and ambulation.  

## 2021-04-23 NOTE — Patient Instructions (Signed)

## 2021-05-03 ENCOUNTER — Encounter: Payer: Self-pay | Admitting: Pain Medicine

## 2021-05-03 ENCOUNTER — Ambulatory Visit
Admission: RE | Admit: 2021-05-03 | Discharge: 2021-05-03 | Disposition: A | Discharge status: Home / Self Care | Payer: Medicare Other | Source: Ambulatory Visit | Attending: Pain Medicine | Admitting: Pain Medicine

## 2021-05-03 ENCOUNTER — Ambulatory Visit (HOSPITAL_BASED_OUTPATIENT_CLINIC_OR_DEPARTMENT_OTHER): Payer: Medicare Other | Admitting: Pain Medicine

## 2021-05-03 VITALS — BP 142/87 | HR 55 | Temp 97.2°F | Resp 15 | Ht 61.0 in | Wt 203.0 lb

## 2021-05-03 DIAGNOSIS — M545 Low back pain, unspecified: Secondary | ICD-10-CM | POA: Insufficient documentation

## 2021-05-03 DIAGNOSIS — Z9682 Presence of neurostimulator: Secondary | ICD-10-CM | POA: Insufficient documentation

## 2021-05-03 DIAGNOSIS — G8929 Other chronic pain: Secondary | ICD-10-CM

## 2021-05-03 DIAGNOSIS — M47817 Spondylosis without myelopathy or radiculopathy, lumbosacral region: Secondary | ICD-10-CM | POA: Diagnosis not present

## 2021-05-03 DIAGNOSIS — M51369 Other intervertebral disc degeneration, lumbar region without mention of lumbar back pain or lower extremity pain: Secondary | ICD-10-CM

## 2021-05-03 DIAGNOSIS — M47816 Spondylosis without myelopathy or radiculopathy, lumbar region: Secondary | ICD-10-CM

## 2021-05-03 DIAGNOSIS — F419 Anxiety disorder, unspecified: Secondary | ICD-10-CM

## 2021-05-03 DIAGNOSIS — M4310 Spondylolisthesis, site unspecified: Secondary | ICD-10-CM | POA: Insufficient documentation

## 2021-05-03 DIAGNOSIS — M5136 Other intervertebral disc degeneration, lumbar region: Secondary | ICD-10-CM | POA: Insufficient documentation

## 2021-05-03 MED ORDER — TRIAMCINOLONE ACETONIDE 40 MG/ML IJ SUSP
INTRAMUSCULAR | Status: AC
  Filled 2021-05-03: qty 2

## 2021-05-03 MED ORDER — MIDAZOLAM HCL 5 MG/5ML IJ SOLN
0.5000 mg | Freq: Once | INTRAMUSCULAR | Status: AC
  Administered 2021-05-03: 3 mg via INTRAVENOUS

## 2021-05-03 MED ORDER — MIDAZOLAM HCL 5 MG/5ML IJ SOLN
INTRAMUSCULAR | Status: AC
  Filled 2021-05-03: qty 5

## 2021-05-03 MED ORDER — LIDOCAINE HCL 2 % IJ SOLN
20.0000 mL | Freq: Once | INTRAMUSCULAR | Status: AC
  Administered 2021-05-03: 100 mg

## 2021-05-03 MED ORDER — LACTATED RINGERS IV SOLN
1000.0000 mL | Freq: Once | INTRAVENOUS | Status: AC
  Administered 2021-05-03: 1000 mL via INTRAVENOUS

## 2021-05-03 MED ORDER — LIDOCAINE HCL (PF) 2 % IJ SOLN
INTRAMUSCULAR | Status: AC
  Filled 2021-05-03: qty 5

## 2021-05-03 MED ORDER — LIDOCAINE HCL (PF) 2 % IJ SOLN
INTRAMUSCULAR | Status: AC
  Filled 2021-05-03: qty 15

## 2021-05-03 MED ORDER — FENTANYL CITRATE (PF) 100 MCG/2ML IJ SOLN
25.0000 ug | INTRAMUSCULAR | Status: DC | PRN
  Administered 2021-05-03: 50 ug via INTRAVENOUS

## 2021-05-03 MED ORDER — FENTANYL CITRATE (PF) 100 MCG/2ML IJ SOLN
INTRAMUSCULAR | Status: AC
  Filled 2021-05-03: qty 2

## 2021-05-03 MED ORDER — TRIAMCINOLONE ACETONIDE 40 MG/ML IJ SUSP
80.0000 mg | Freq: Once | INTRAMUSCULAR | Status: AC
  Administered 2021-05-03: 80 mg

## 2021-05-03 MED ORDER — ROPIVACAINE HCL 2 MG/ML IJ SOLN
INTRAMUSCULAR | Status: AC
  Filled 2021-05-03: qty 20

## 2021-05-03 MED ORDER — ROPIVACAINE HCL 2 MG/ML IJ SOLN
18.0000 mL | Freq: Once | INTRAMUSCULAR | Status: AC
  Administered 2021-05-03: 18 mL via PERINEURAL

## 2021-05-03 NOTE — Progress Notes (Signed)
PROVIDER NOTE: Interpretation of information contained herein should be left to medically-trained personnel. Specific patient instructions are provided elsewhere under "Patient Instructions" section of medical record. This document was created in part using STT-dictation technology, any transcriptional errors that may result from this process are unintentional.  ?Patient: [name] ?Type: Established ?DOB: 1945-07-19 ?MRN: [id number] ?PCP: Leonides Sake, MD  Service: Procedure ?DOS: 05/03/2021 ?Setting: Ambulatory ?Location: Ambulatory outpatient facility ?Delivery: Face-to-face Provider: Gaspar Cola, MD ?Specialty: Interventional Pain Management ?Specialty designation: 09 ?Location: Outpatient facility ?Ref. Prov.: Hamrick, Lorin Mercy, MD   ? ?Primary Reason for Visit: Interventional Pain Management Treatment. ?CC: Back Pain (lower) ?  ?Procedure:          ? Type: Lumbar Facet, Medial Branch Block(s)  #R6L5, but 1st of 2023   ?Laterality: Bilateral  ?Level: L2, L3, L4, L5, & S1 Medial Branch Level(s). Injecting these levels blocks the L3-4 and L5-S1 lumbar facet joints. ?Imaging: Fluoroscopic guidance ?Anesthesia: Local anesthesia (1-2% Lidocaine) ?Anxiolysis: IV Versed 3.0 mg ?Sedation: Moderate conscious sedation. ?DOS: 05/03/2021 ?Performed by: Gaspar Cola, MD ? ?Primary Purpose: Diagnostic/Therapeutic ?Indications: Low back pain severe enough to impact quality of life or function. ?1. Lumbar facet syndrome (Bilateral) (L>R)   ?2. Spondylosis without myelopathy or radiculopathy, lumbosacral region   ?3. Grade 2 Anterolisthesis of L4/L5 (8-11 mm w/ dynamic instability) and L5-S1 (2 mm) (Stable)   ?4. Lumbar facet hypertrophy (L4-5)   ?5. DDD (degenerative disc disease), lumbar   ?6. Lumbar facet arthropathy ([facility])   ?7. Chronic low back pain (1ry area of Pain) (Bilateral) (L>R) w/o sciatica   ? ?NAS-11 Pain score:  ? Pre-procedure: 2 /10  ? Post-procedure: 0-No pain/10  ? ?Currently the  patient has been experiencing some issues with the pulse generator on her spinal cord stimulator where it appears not to be taking a full charge.  Today we have made arrangements for the [employer] representative to be present so that they can analyze the device and determine if there is anything that can be done.  Today I have shared with the patient the results of her recent lumbar spine x-rays on flexion and extension and I have interpreted these results in layman's terms.  The patient has been instructed to bring her weight down since she had allowed her BMI to go up to 38.36 kg/m?. ? ?According to the IPG analysis, the battery will need to be replaced. ?  ?Position / Prep / Materials:  ?Position: Prone  ?Prep solution: DuraPrep (Iodine Povacrylex [0.7% available iodine] and Isopropyl Alcohol, 74% w/w) ?Area Prepped: Posterolateral Lumbosacral Spine (Wide prep: From the lower border of the scapula down to the end of the tailbone and from flank to flank.) ? ?Materials:  ?Tray: Block ?Needle(s):  ?Type: Spinal  ?Gauge (G): 22  ?Length: 5-in ?Qty: 4 ? ?Pre-op H&P Assessment:  ?[name] is a 76 y.o. (year old), female patient, seen today for interventional treatment. She  has a past surgical history that includes Vaginal hysterectomy (1975); Incontinence surgery (2001); Oophorectomy (1978); Elbow Arthroplasty (1990); Cholecystectomy (1989); Rectocele repair (2009); back implant (2015); Mastectomy partial / lumpectomy (Bilateral, 1978 ); Augmentation mammaplasty (Bilateral, 1978); Colonoscopy with propofol (N/A, 01/29/2017); Spinal cord stimulator implant; Esophagogastroduodenoscopy (egd) with propofol (N/A, 11/07/2017); and Knee surgery. [name] has a current medication list which includes the following prescription(s): acetaminophen, albuterol, alprazolam, aspirin, atenolol, calcium carbonate, vitamin d-3, citalopram, estradiol, fenofibrate, fluticasone, fluticasone-salmeterol, hydrochlorothiazide,  levothyroxine, loperamide, melatonin, montelukast, multivitamin with minerals, ropinirole, tramadol, vitamin b-12,  zolpidem, and cyclobenzaprine, and the following Facility-Administered Medications: fentanyl. Her primarily concern today is the Back Pain (lower) ? ?Initial Vital Signs:  ?Pulse/HCG Rate: (!) 55ECG Heart Rate: 70 ?Temp: (!) 97.1 ?F (36.2 ?C) ?Resp: 18 ?BP: (!) 142/68 ?SpO2: 99 % ? ?BMI: Estimated body mass index is 38.36 kg/m? as calculated from the following: ?  Height as of this encounter: '5\' 1"'$  (1.549 m). ?  Weight as of this encounter: 203 lb (92.1 kg). ? ?Risk Assessment: ?Allergies: Reviewed. She is allergic to aspirin, morphine, vicodin [hydrocodone-acetaminophen], niacin and related, and penicillins.  ?Allergy Precautions: None required ?Coagulopathies: Reviewed. None identified.  ?Blood-thinner therapy: None at this time ?Active Infection(s): Reviewed. None identified. [name] is afebrile ? ?Site Confirmation: [name] was asked to confirm the procedure and laterality before marking the site ?Procedure checklist: Completed ?Consent: Before the procedure and under the influence of no sedative(s), amnesic(s), or anxiolytics, the patient was informed of the treatment options, risks and possible complications. To fulfill our ethical and legal obligations, as recommended by the American Medical Association's Code of Ethics, I have informed the patient of my clinical impression; the nature and purpose of the treatment or procedure; the risks, benefits, and possible complications of the intervention; the alternatives, including doing nothing; the risk(s) and benefit(s) of the alternative treatment(s) or procedure(s); and the risk(s) and benefit(s) of doing nothing. ?The patient was provided information about the general risks and possible complications associated with the procedure. These may include, but are not limited to: failure to achieve desired goals, infection, bleeding, organ or  nerve damage, allergic reactions, paralysis, and death. ?In addition, the patient was informed of those risks and complications associated to Spine-related procedures, such as failure to decrease pain; infection (i.e.: Meningitis, epidural or intraspinal abscess); bleeding (i.e.: epidural hematoma, subarachnoid hemorrhage, or any other type of intraspinal or peri-dural bleeding); organ or nerve damage (i.e.: Any type of peripheral nerve, nerve root, or spinal cord injury) with subsequent damage to sensory, motor, and/or autonomic systems, resulting in permanent pain, numbness, and/or weakness of one or several areas of the body; allergic reactions; (i.e.: anaphylactic reaction); and/or death. ?Furthermore, the patient was informed of those risks and complications associated with the medications. These include, but are not limited to: allergic reactions (i.e.: anaphylactic or anaphylactoid reaction(s)); adrenal axis suppression; blood sugar elevation that in diabetics may result in ketoacidosis or comma; water retention that in patients with history of congestive heart failure may result in shortness of breath, pulmonary edema, and decompensation with resultant heart failure; weight gain; swelling or edema; medication-induced neural toxicity; particulate matter embolism and blood vessel occlusion with resultant organ, and/or nervous system infarction; and/or aseptic necrosis of one or more joints. ?Finally, the patient was informed that Medicine is not an exact science; therefore, there is also the possibility of unforeseen or unpredictable risks and/or possible complications that may result in a catastrophic outcome. The patient indicated having understood very clearly. We have given the patient no guarantees and we have made no promises. Enough time was given to the patient to ask questions, all of which were answered to the patient's satisfaction. Ms. Berhow has indicated that she wanted to continue with the  procedure. ?Attestation: I, the ordering provider, attest that I have discussed with the patient the benefits, risks, side-effects, alternatives, likelihood of achieving goals, and potential problems during recovery

## 2021-05-03 NOTE — Patient Instructions (Signed)
____________________________________________________________________________________________ ? ?Post-Procedure Discharge Instructions ? ?Instructions: ?Apply ice:  ?Purpose: This will minimize any swelling and discomfort after procedure.  ?When: Day of procedure, as soon as you get home. ?How: Fill a plastic sandwich bag with crushed ice. Cover it with a small towel and apply to injection site. ?How long: (15 min on, 15 min off) Apply for 15 minutes then remove x 15 minutes.  Repeat sequence on day of procedure, until you go to bed. ?Apply heat:  ?Purpose: To treat any soreness and discomfort from the procedure. ?When: Starting the next day after the procedure. ?How: Apply heat to procedure site starting the day following the procedure. ?How long: May continue to repeat daily, until discomfort goes away. ?Food intake: Start with clear liquids (like water) and advance to regular food, as tolerated.  ?Physical activities: Keep activities to a minimum for the first 8 hours after the procedure. After that, then as tolerated. ?Driving: If you have received any sedation, be responsible and do not drive. You are not allowed to drive for 24 hours after having sedation. ?Blood thinner: (Applies only to those taking blood thinners) You may restart your blood thinner 6 hours after your procedure. ?Insulin: (Applies only to Diabetic patients taking insulin) As soon as you can eat, you may resume your normal dosing schedule. ?Infection prevention: Keep procedure site clean and dry. Shower daily and clean area with soap and water. ?Post-procedure Pain Diary: Extremely important that this be done correctly and accurately. Recorded information will be used to determine the next step in treatment. For the purpose of accuracy, follow these rules: ?Evaluate only the area treated. Do not report or include pain from an untreated area. For the purpose of this evaluation, ignore all other areas of pain, except for the treated area. ?After  your procedure, avoid taking a long nap and attempting to complete the pain diary after you wake up. Instead, set your alarm clock to go off every hour, on the hour, for the initial 8 hours after the procedure. Document the duration of the numbing medicine, and the relief you are getting from it. ?Do not go to sleep and attempt to complete it later. It will not be accurate. If you received sedation, it is likely that you were given a medication that may cause amnesia. Because of this, completing the diary at a later time may cause the information to be inaccurate. This information is needed to plan your care. ?Follow-up appointment: Keep your post-procedure follow-up evaluation appointment after the procedure (usually 2 weeks for most procedures, 6 weeks for radiofrequencies). DO NOT FORGET to bring you pain diary with you.  ? ?Expect: (What should I expect to see with my procedure?) ?From numbing medicine (AKA: Local Anesthetics): Numbness or decrease in pain. You may also experience some weakness, which if present, could last for the duration of the local anesthetic. ?Onset: Full effect within 15 minutes of injected. ?Duration: It will depend on the type of local anesthetic used. On the average, 1 to 8 hours.  ?From steroids (Applies only if steroids were used): Decrease in swelling or inflammation. Once inflammation is improved, relief of the pain will follow. ?Onset of benefits: Depends on the amount of swelling present. The more swelling, the longer it will take for the benefits to be seen. In some cases, up to 10 days. ?Duration: Steroids will stay in the system x 2 weeks. Duration of benefits will depend on multiple posibilities including persistent irritating factors. ?Side-effects: If present, they  may typically last 2 weeks (the duration of the steroids). ?Frequent: Cramps (if they occur, drink Gatorade and take over-the-counter Magnesium 450-500 mg once to twice a day); water retention with temporary  weight gain; increases in blood sugar; decreased immune system response; increased appetite. ?Occasional: Facial flushing (red, warm cheeks); mood swings; menstrual changes. ?Uncommon: Long-term decrease or suppression of natural hormones; bone thinning. (These are more common with higher doses or more frequent use. This is why we prefer that our patients avoid having any injection therapies in other practices.)  ?Very Rare: Severe mood changes; psychosis; aseptic necrosis. ?From procedure: Some discomfort is to be expected once the numbing medicine wears off. This should be minimal if ice and heat are applied as instructed. ? ?Call if: (When should I call?) ?You experience numbness and weakness that gets worse with time, as opposed to wearing off. ?New onset bowel or bladder incontinence. (Applies only to procedures done in the spine) ? ?Emergency Numbers: ?[facility] business hours (Monday - Thursday, 8:00 AM - 4:00 PM) (Friday, 9:00 AM - 12:00 Noon): [phone] ?After hours: [phone] ?NOTE: If you are having a problem and are unable connect with, or to talk to a provider, then go to your nearest urgent care or emergency department. If the problem is serious and urgent, please call 911. ?____________________________________________________________________________________________ ?  ?______________________________________________________________________________________________ ? ?Body mass index (BMI) ? ?Body mass index (BMI) is a common tool for deciding whether a person has an appropriate body weight.  It measures a persons weight in relation to their height.   ?According to the [employer] of health (NIH): ?A BMI of less than 18.5 means that a person is underweight. ?A BMI of between 18.5 and 24.9 is ideal. ?A BMI of between 25 and 29.9 is overweight. ?A BMI over 30 indicates obesity. ? ?Weight Management Required ? ?URGENT: Your weight has been found to be adversely affecting your health. ? ?Dear  [name]: ? ?Your current Estimated body mass index is 38.36 kg/m? as calculated from the following: ?  Height as of this encounter: '5\' 1"'$  (1.549 m). ?  Weight as of this encounter: 203 lb (92.1 kg). ? ?Please use the table below to identify your weight category and associated incidence of chronic pain, secondary to your weight. ? ?Body Mass Index (BMI) Classification ?BMI level (kg/m2) Category Associated incidence of chronic pain  ?<18  Underweight   ?18.5-24.9 Ideal body weight   ?25-29.9 Overweight  20%  ?30-34.9 Obese (Class I)  68%  ?35-39.9 Severe obesity (Class II)  136%  ?>40 Extreme obesity (Class III)  254%  ? ?In addition: You will be considered "Morbidly Obese", if your BMI is above 30 and you have one or more of the following conditions which are known to be caused and/or directly associated with obesity: ?1.    Type 2 Diabetes (Which in turn can lead to cardiovascular diseases (CVD), stroke, peripheral vascular diseases (PVD), retinopathy, nephropathy, and neuropathy) ?2.    Cardiovascular Disease (High Blood Pressure; Congestive Heart Failure; High Cholesterol; Coronary Artery Disease; Angina; or History of Heart Attacks) ?3.    Breathing problems (Asthma; obesity-hypoventilation syndrome; obstructive sleep apnea; chronic inflammatory airway disease; reactive airway disease; or shortness of breath) ?4.    Chronic kidney disease ?5.    Liver disease (nonalcoholic fatty liver disease) ?6.    High blood pressure ?7.    Acid reflux (gastroesophageal reflux disease; heartburn) ?8.    Osteoarthritis (OA) (with any of the following: hip  pain; knee pain; and/or low back pain) ?9.    Low back pain (Lumbar Facet Syndrome; and/or Degenerative Disc Disease) ?10.  Hip pain (Osteoarthritis of hip) (For every 1 lbs of added body weight, there is a 2 lbs increase in pressure inside of each hip articulation. 1:2 mechanical relationship) ?11.  Knee pain (Osteoarthritis of knee) (For every 1 lbs of added body  weight, there is a 4 lbs increase in pressure inside of each knee articulation. 1:4 mechanical relationship) (patients with a BMI>30 kg/m2 were 6.8 times more likely to develop knee OA than normal-weight ind

## 2021-05-04 ENCOUNTER — Telehealth: Payer: Self-pay

## 2021-05-04 NOTE — Telephone Encounter (Signed)
Post procedure phone call.  Patient states she is doing well.  
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
## 2021-05-22 ENCOUNTER — Ambulatory Visit: Payer: Medicare Other | Attending: Pain Medicine | Admitting: Pain Medicine

## 2021-05-22 ENCOUNTER — Telehealth: Payer: Medicare Other | Admitting: Pain Medicine

## 2021-05-22 DIAGNOSIS — M4310 Spondylolisthesis, site unspecified: Secondary | ICD-10-CM

## 2021-05-22 DIAGNOSIS — M47816 Spondylosis without myelopathy or radiculopathy, lumbar region: Secondary | ICD-10-CM | POA: Diagnosis not present

## 2021-05-22 DIAGNOSIS — Z9682 Presence of neurostimulator: Secondary | ICD-10-CM | POA: Diagnosis not present

## 2021-05-22 DIAGNOSIS — M545 Low back pain, unspecified: Secondary | ICD-10-CM | POA: Diagnosis not present

## 2021-05-22 DIAGNOSIS — M961 Postlaminectomy syndrome, not elsewhere classified: Secondary | ICD-10-CM

## 2021-05-22 DIAGNOSIS — G8929 Other chronic pain: Secondary | ICD-10-CM

## 2021-05-29 ENCOUNTER — Other Ambulatory Visit (HOSPITAL_BASED_OUTPATIENT_CLINIC_OR_DEPARTMENT_OTHER): Payer: Medicare Other | Admitting: Pain Medicine

## 2021-05-29 DIAGNOSIS — M961 Postlaminectomy syndrome, not elsewhere classified: Secondary | ICD-10-CM

## 2021-05-29 DIAGNOSIS — Z4542 Encounter for adjustment and management of neuropacemaker (brain) (peripheral nerve) (spinal cord): Secondary | ICD-10-CM

## 2021-05-29 DIAGNOSIS — Z9682 Presence of neurostimulator: Secondary | ICD-10-CM

## 2021-05-29 NOTE — Progress Notes (Signed)
?  ?Patient: [name]    Provider: Gaspar Cola, MD  ?DOB: August 11, 1945    Specialty: Interventional Pain Management  ?MRN: [id number]    PCP: Leonides Sake, MD  ?Type: Established Patient    Referring Provider: No ref. provider found  ?      ? ?HPI  ?[name], a 76 y.o. year old female, with Failed back surgical syndrome [M96.1].  ?Last encounter: My last encounter with her was on 05/22/2021. ?Pertinent problems: Ms. Palmisano has Chronic pain syndrome; Chronic low back pain (1ry area of Pain) (Bilateral) (L>R) w/o sciatica; Failed back surgical syndrome; Postlaminectomy syndrome, lumbar region; Presence of functional implant ([employer] lumbar spinal cord stimulator); Lumbar spondylosis; Lumbar facet syndrome (Bilateral) (L>R); Lumbar facet arthropathy ([place]); Lumbar facet hypertrophy (L4-5); Lumbar foraminal stenosis (bilateral L4-5); Lower extremity pain (Left); Chronic radicular lumbar pain (Left); Trochanteric bursitis of hip (Left); Chronic hip pain (bilateral); Chronic sacroiliac joint pain (Left); Fibromyalgia; Restless leg syndrome; Chronic lower extremity pain (2ry area of Pain) (Left); Neurogenic pain; Chronic musculoskeletal pain; Muscle spasm of back; Lumbar spine instability (L4-5); Grade 2 Anterolisthesis of L4/L5 (8-11 mm w/ dynamic instability) and L5-S1 (2 mm) (Stable); Myofascial pain; DDD (degenerative disc disease), lumbar; Spondylosis without myelopathy or radiculopathy, lumbosacral region; Other specified dorsopathies, sacral and sacrococcygeal region; Chronic hip pain (Right); Osteoarthritis of hip (Right); Chronic pain of right knee; Lumbar spinal stenosis (L4-5 and L1-2) w/ claudication; Left-sided weakness; Neurostimulator device in situ (Thoracolumbar) ([employer]); and Battery end of life of spinal cord stimulator on their pertinent problem list. ? ?On the patient's last encounter, she had indicated having problems with her spinal cord stimulator.   Evaluation revealed that the device has reached end of battery life and will need to be replaced.  I will be putting an order to take the patient to the OR for revision and replacement of the device which has been working effectively until the battery started running out.  The patient indicates that she would like to have the battery changed and continue with the device as she has for many years.  According to our records the device was implanted prior to 2016. ? ?Pharmacotherapy Assessment  ?Analgesic: No opioid analgesics prescribed by our practice.  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 ?MME/day: 0 mg/day.  ? ?Monitoring: ?[place] PMP: PDMP not reviewed this encounter.       ?Pharmacotherapy: No side-effects or adverse reactions reported. ?Compliance: No problems identified. ?Effectiveness: Clinically acceptable. ? ?No notes on file  UDS:  ?Summary  ?Date Value Ref Range Status  ?10/30/2017 FINAL  Final  ?  Comment:  ?  ==================================================================== ?[place] ([place]) ?==================================================================== ?Test                             Result       Flag       Units ?Drug Present and Declared for Prescription Verification ?  Alprazolam                     191          EXPECTED   ng/mg creat ?  Alpha-hydroxyalprazolam        1379         EXPECTED   ng/mg creat ?   Source of alprazolam is a scheduled prescription medication. ?  Alpha-hydroxyalprazolam is an expected metabolite of alprazolam. ?  Oxycodone                      2579         EXPECTED   ng/mg creat ?  Oxymorphone                    800          EXPECTED   ng/mg creat ?  Noroxycodone                   9079         EXPECTED   ng/mg creat ?  Noroxymorphone                 240          EXPECTED   ng/mg creat ?   Sources of oxycodone are scheduled prescription medications. ?    Oxymorphone, noroxycodone, and noroxymorphone are expected ?   metabolites of oxycodone. Oxymorphone is also available as a ?   scheduled prescription medication. ?==================================================================== ?Test                      Result    Flag   Units      Ref Range ?  Creatinine              43               mg/dL      >=20 ?==================================================================== ?Declared Medications: ? The flagging and interpretation on this report are based on the ? following declared medications.  Unexpected results may arise from ? inaccuracies in the declared medications. ? **Note: The testing scope of this panel includes these medications: ? Alprazolam ? Oxycodone ? **Note: The testing scope of this panel does not include following ? reported medications: ? Albuterol ? Alendronate ? Aspirin (Aspirin 81) ? Atenolol ? Calcium ? Cholecalciferol ? Cinnamon Bark ? Citalopram ? Conjugated Estrogens ? Fenofibrate ? Fluticasone (Advair) ? Furosemide ? Gabapentin ? Meclizine ? Melatonin ? Montelukast ? Omega-3 Fatty Acids (Fish Oil) ? Ropinirole ? Salmeterol (Advair) ? Supplement (Omega-3) ? Tizanidine ? Vitamin K ? Zolpidem ?==================================================================== ?For clinical consultation, please call [phone]. ?==================================================================== ?  ?  ? ?Medication Review  ?ALPRAZolam, Fluticasone-Salmeterol, Vitamin D-3, albuterol, aspirin, atenolol, calcium carbonate, citalopram, cyclobenzaprine, estradiol, fenofibrate, fluticasone, hydrochlorothiazide, levothyroxine, melatonin, montelukast, rOPINIRole, traMADol, vitamin B-12, and zolpidem ? ?Laboratory Chemistry Profile  ? ?Renal ?Lab Results  ?Component Value Date  ? BUN 5 (L) 03/08/2019  ? CREATININE 0.58 03/12/2019  ? BCR 23 07/18/2016  ? GFRAA >60 03/12/2019  ? GFRNONAA >60 03/12/2019  ?  Hepatic ?Lab Results  ?Component Value Date  ? AST 18  03/08/2019  ? ALT 31 03/08/2019  ? ALBUMIN 2.2 (L) 03/08/2019  ? ALKPHOS 63 03/08/2019  ? AMMONIA 17 03/02/2019  ?  ?Electrolytes ?Lab Results  ?Component Value Date  ? NA 145 03/08/2019  ? K 4.0 03/08/2019  ? CL 102 03/08/2019  ? CALCIUM 10.2 03/08/2019  ? MG 1.8 03/05/2019  ? PHOS 2.9 03/04/2019  ?  Bone ?Lab Results  ?Component Value Date  ? 25OHVITD1 62 07/18/2016  ? 62IWLNLG9 <1.0 07/18/2016  ? 25OHVITD3 62 07/18/2016  ?  ?Inflammation (CRP: Acute Phase) (ESR: Chronic Phase) ?Lab Results  ?Component Value Date  ? CRP 0.6 07/18/2016  ? ESRSEDRATE 2 07/18/2016  ? LATICACIDVEN 1.0 03/01/2019  ?    ?  ? ?  Assessment  ? ?Diagnosis Status  ?1. Failed back surgical syndrome   ?2. Neurostimulator device in situ (Thoracolumbar) ([employer])   ?3. Battery end of life of spinal cord stimulator   ? Stable ? ?  ? ?Updated Problems: ?Problem  ?Battery End of Life of Spinal Cord Stimulator  ? ? ?Plan of Care  ?Revision and replacement of spinal cord stimulator battery due to normal end of battery life.  (Device implanted prior to 2016) ? ?  ?Interventional Therapies  ?Risk  Complexity Considerations:   ?Estimated body mass index is 38.47 kg/m? as calculated from the following: ?  Height as of this encounter: '5\' 1"'  (1.549 m). ?  Weight as of this encounter: 203 lb 9.6 oz (92.4 kg). ?WNL  ? ?Planned  Pending:   ?Reprogramming of spinal cord stimulator by [employer] representative (05/03/2021).  Program revealed that the device has reached end of battery life.  (Device implanted prior to 2016). ?Revision and replacement of [employer] spinal cord stimulator implant secondary to end of battery life.  ? ?Under consideration:   ?Possible spinal cord stimulator battery replacement. ?Diagnostic bilateral IA hip injection. ?Diagnostic/therapeutic right L2-3 LESI #2 ?Diagnostic/therapeutic L1-2 vs L4-5 LESI. ?Diagnostic/therapeutic bilateral L4-5 TFESI.  ? ?Completed:   ?Diagnostic/therapeutic right lumbar facet MBB x6 (05/03/2021)  (100/100/100/100)  ?Diagnostic/therapeutic left lumbar facet MBB x5 (05/03/2021) (100/100/100/100)  ?Therapeutic left lumbar facet RFA x3 (09/15/2018) (100/100/100 x 9 days/90-100)  ?Therapeutic left SI RFA x1 (07/10/2017) (100/10

## 2021-06-06 ENCOUNTER — Encounter: Payer: Self-pay | Admitting: Pain Medicine

## 2021-06-06 ENCOUNTER — Other Ambulatory Visit: Payer: Self-pay

## 2021-06-06 ENCOUNTER — Ambulatory Visit: Payer: Medicare Other | Attending: Pain Medicine | Admitting: Pain Medicine

## 2021-06-06 VITALS — BP 136/74 | HR 85 | Temp 97.6°F | Ht 61.0 in | Wt 203.0 lb

## 2021-06-06 DIAGNOSIS — M4310 Spondylolisthesis, site unspecified: Secondary | ICD-10-CM | POA: Insufficient documentation

## 2021-06-06 DIAGNOSIS — Z969 Presence of functional implant, unspecified: Secondary | ICD-10-CM | POA: Insufficient documentation

## 2021-06-06 DIAGNOSIS — M961 Postlaminectomy syndrome, not elsewhere classified: Secondary | ICD-10-CM | POA: Insufficient documentation

## 2021-06-06 DIAGNOSIS — M48062 Spinal stenosis, lumbar region with neurogenic claudication: Secondary | ICD-10-CM | POA: Diagnosis not present

## 2021-06-06 DIAGNOSIS — G894 Chronic pain syndrome: Secondary | ICD-10-CM | POA: Diagnosis not present

## 2021-06-06 DIAGNOSIS — M79605 Pain in left leg: Secondary | ICD-10-CM | POA: Diagnosis not present

## 2021-06-06 DIAGNOSIS — M532X6 Spinal instabilities, lumbar region: Secondary | ICD-10-CM | POA: Insufficient documentation

## 2021-06-06 DIAGNOSIS — M47816 Spondylosis without myelopathy or radiculopathy, lumbar region: Secondary | ICD-10-CM | POA: Insufficient documentation

## 2021-06-06 DIAGNOSIS — Z4542 Encounter for adjustment and management of neuropacemaker (brain) (peripheral nerve) (spinal cord): Secondary | ICD-10-CM | POA: Insufficient documentation

## 2021-06-06 DIAGNOSIS — M545 Low back pain, unspecified: Secondary | ICD-10-CM | POA: Insufficient documentation

## 2021-06-06 DIAGNOSIS — M48061 Spinal stenosis, lumbar region without neurogenic claudication: Secondary | ICD-10-CM | POA: Insufficient documentation

## 2021-06-06 DIAGNOSIS — G8929 Other chronic pain: Secondary | ICD-10-CM | POA: Diagnosis not present

## 2021-06-06 DIAGNOSIS — M5136 Other intervertebral disc degeneration, lumbar region: Secondary | ICD-10-CM | POA: Insufficient documentation
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
## 2021-06-06 NOTE — Progress Notes (Signed)
Nursing Pain Medication Assessment:  ?Safety precautions to be maintained throughout the outpatient stay will include: orient to surroundings, keep bed in low position, maintain call bell within reach at all times, provide assistance with transfer out of bed and ambulation.  ?Medication Inspection Compliance: Ms. Convery did not comply with our request to bring her pills to be counted. She was reminded that bringing the medication bottles, even when empty, is a requirement. ? ?Medication: None brought in. ?Pill/Patch Count: None available to be counted. ?Bottle Appearance: No container available. Did not bring bottle(s) to appointment. ?Filled Date: N/A ?Last Medication intake:   last week ?

## 2021-06-06 NOTE — Progress Notes (Signed)
PROVIDER NOTE: Information contained herein reflects review and annotations entered in association with encounter. Interpretation of such information and data should be left to medically-trained personnel. Information provided to patient can be located elsewhere in the medical record under "Patient Instructions". Document created using STT-dictation technology, any transcriptional errors that may result from process are unintentional.  ?  ?Patient: [name]  Service Category: E/M  Provider: Gaspar Cola, MD  ?DOB: [birth year]  DOS: 06/06/2021  Specialty: Interventional Pain Management  ?MRN: [id number]  Setting: Ambulatory outpatient  PCP: Leonides Sake, MD  ?Type: Established Patient    Referring Provider: Leonides Sake, MD  ?Location: Office  Delivery: Face-to-face    ? ?HPI  ?[name], a 76 y.o. year old female, is here today because of her Battery end of life of spinal cord stimulator [Z45.42]. Ms. Coonrod's primary complain today is Back Pain (low) ?Last encounter: My last encounter with her was on 05/22/2021. ?Pertinent problems: Ms. Leisure has Chronic pain syndrome; Chronic low back pain (1ry area of Pain) (Bilateral) (L>R) w/o sciatica; Failed back surgical syndrome; Postlaminectomy syndrome, lumbar region; Presence of functional implant (Medtronic lumbar spinal cord stimulator); Lumbar spondylosis; Lumbar facet syndrome (Bilateral) (L>R); Lumbar facet arthropathy ([place]); Lumbar facet hypertrophy (L4-5); Lumbar foraminal stenosis (bilateral L4-5); Lower extremity pain (Left); Chronic radicular lumbar pain (Left); Trochanteric bursitis of hip (Left); Chronic hip pain (bilateral); Chronic sacroiliac joint pain (Left); Fibromyalgia; Restless leg syndrome; Chronic lower extremity pain (2ry area of Pain) (Left); Neurogenic pain; Chronic musculoskeletal pain; Muscle spasm of back; Lumbar spine instability (L4-5); Grade 2 Anterolisthesis of L4/L5 (8-11 mm w/ dynamic  instability) and L5-S1 (2 mm) (Stable); Myofascial pain; DDD (degenerative disc disease), lumbar; Spondylosis without myelopathy or radiculopathy, lumbosacral region; Other specified dorsopathies, sacral and sacrococcygeal region; Chronic hip pain (Right); Osteoarthritis of hip (Right); Chronic pain of right knee; Lumbar spinal stenosis (L4-5 and L1-2) w/ claudication; Left-sided weakness; Neurostimulator device in situ (Thoracolumbar) (Medtronic); and Battery end of life of spinal cord stimulator on their pertinent problem list. ?Pain Assessment: Severity of Chronic pain is reported as a 3 /10. Location: Back Lower/denies. Onset: More than a month ago. Quality: Aching, Dull. Timing: Intermittent. Modifying factor(s): medicatios when needed, moving around, topicals. ?Vitals:  height is '5\' 1"'$  (1.549 m) and weight is 203 lb (92.1 kg). Her temporal temperature is 97.6 ?F (36.4 ?C). Her blood pressure is 136/74 and her pulse is 85. Her oxygen saturation is 100%.  ? ?Reason for encounter: Pre-operative heart & lung assessment. I have discussed with the patient the benefits, risks, side effects, alternatives, likelihood of achieving goals and potential problems associated with the planned procedure. The patient refers understanding. Pre-operative cardiopulmonary assessment shows: Respiratory system: CTA bilaterally, no wheezing, no crackles, normal respiratory effort. Cardiovascular system: regular rate and rhythm, no murmur.  The patient indicates that she still has the tramadol prescription that we wrote for her on 04/23/2021.  She states that she does not need a refill on it.  Today we will go over a couple of things including an update that the patient provided me regarding her thyroid problems.  I also spoke to her about losing weight.  I have provided her some information regarding deep breathing so as to keep her lung bases from developing atelectasis.  I have also answered all of her questions regarding the  surgery.  I have also reminded her that this is an elective surgery and that should she begin to develop any type  of problems prior to the surgery, she would need to let us know and perhaps postpone until those are soft.  She understood and indicated that she would. ? ?Pharmacotherapy Assessment  ?Analgesic: No opioid analgesics prescribed by our practice.  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 ?MME/day: 0 mg/day.  ? ?Monitoring: ?[place] PMP: PDMP reviewed during this encounter.       ?Pharmacotherapy: No side-effects or adverse reactions reported. ?Compliance: No problems identified. ?Effectiveness: Clinically acceptable. ? ?Hart Rochester, RN  06/06/2021  9:00 AM  Signed ?Nursing Pain Medication Assessment:  ?Safety precautions to be maintained throughout the outpatient stay will include: orient to surroundings, keep bed in low position, maintain call bell within reach at all times, provide assistance with transfer out of bed and ambulation.  ?Medication Inspection Compliance: Ms. Fielder did not comply with our request to bring her pills to be counted. She was reminded that bringing the medication bottles, even when empty, is a requirement. ? ?Medication: None brought in. ?Pill/Patch Count: None available to be counted. ?Bottle Appearance: No container available. Did not bring bottle(s) to appointment. ?Filled Date: N/A ?Last Medication intake:   last week ?   UDS:  ?Summary  ?Date Value Ref Range Status  ?10/30/2017 FINAL  Final  ?  Comment:  ?  ==================================================================== ?[facility] ([facility]) ?==================================================================== ?Test                             Result       Flag       Units ?Drug Present and Declared for Prescription Verification ?  Alprazolam                     191          EXPECTED   ng/mg creat ?   Alpha-hydroxyalprazolam        1379         EXPECTED   ng/mg creat ?   Source of alprazolam is a scheduled prescription medication. ?   Alpha-hydroxyalprazolam is an expected metabolite of alprazolam. ?  Oxycodone                      2579         EXPECTED   ng/mg creat ?  Oxymorphone                    800          EXPECTED   ng/mg creat ?  Noroxycodone                   9079         EXPECTED   ng/mg creat ?  Noroxymorphone                 240          EXPECTED   ng/mg creat ?   Sources of oxycodone are scheduled prescription medications. ?   Oxymorphone, noroxycodone, and noroxymorphone are expected ?   metabolites of oxycodone. Oxymorphone is also available as a ?   scheduled prescription medication. ?==================================================================== ?Test                      Result    Flag   Units      Ref Range ?  Creatinine              43               mg/dL      >=20 ?==================================================================== ?Declared Medications: ? The flagging and interpretation on this report are based on the ? following declared medications.  Unexpected results may arise from ? inaccuracies in the declared medications. ? **Note: The testing scope of this panel includes these medications: ? Alprazolam ? Oxycodone ? **Note: The testing scope of this panel does not include following ? reported medications: ? Albuterol ? Alendronate ? Aspirin (Aspirin 81) ? Atenolol ? Calcium ? Cholecalciferol ? Cinnamon Bark ? Citalopram ? Conjugated Estrogens ? Fenofibrate ? Fluticasone (Advair) ? Furosemide ? Gabapentin ? Meclizine ? Melatonin ? Montelukast ? Omega-3 Fatty Acids (Fish Oil) ? Ropinirole ? Salmeterol (Advair) ? Supplement (Omega-3) ? Tizanidine ? Vitamin K ? Zolpidem ?==================================================================== ?For clinical consultation, please call [phone]. ?==================================================================== ?  ?  ? ?ROS   ?Constitutional: Denies any fever or chills ?Gastrointestinal: No reported hemesis, hematochezia, vomiting, or acute GI distress ?Musculoskeletal: Denies any acute onset joint swelling, redness, loss of ROM, or weakness ?Neurological: No

## 2021-06-13 DIAGNOSIS — Z79899 Other long term (current) drug therapy: Secondary | ICD-10-CM | POA: Diagnosis not present

## 2021-06-20 ENCOUNTER — Encounter
Admission: RE | Admit: 2021-06-20 | Discharge: 2021-06-20 | Disposition: A | Discharge status: Home / Self Care | Payer: Medicare Other | Source: Ambulatory Visit | Attending: Pain Medicine | Admitting: Pain Medicine

## 2021-06-20 ENCOUNTER — Telehealth: Payer: Self-pay | Admitting: Pain Medicine

## 2021-06-20 DIAGNOSIS — J45909 Unspecified asthma, uncomplicated: Secondary | ICD-10-CM

## 2021-06-20 DIAGNOSIS — Z01818 Encounter for other preprocedural examination: Secondary | ICD-10-CM

## 2021-06-20 DIAGNOSIS — Z79899 Other long term (current) drug therapy: Secondary | ICD-10-CM

## 2021-06-20 DIAGNOSIS — I10 Essential (primary) hypertension: Secondary | ICD-10-CM

## 2021-06-20 HISTORY — DX: Dyspnea, unspecified: R06.00

## 2021-06-20 HISTORY — DX: Unspecified asthma, uncomplicated: J45.909

## 2021-06-20 HISTORY — DX: Hypothyroidism, unspecified: E03.9

## 2021-06-20 HISTORY — DX: Interstitial pulmonary disease, unspecified: J84.9

## 2021-06-20 HISTORY — DX: Unspecified osteoarthritis, unspecified site: M19.90

## 2021-06-20 NOTE — Telephone Encounter (Signed)
Heather from radiology called stated that patient will be having an procedure done on 06-28-21. It's no work order in Dentist from Dr. Dossie [facility]

## 2021-06-20 NOTE — Patient Instructions (Addendum)
Your procedure is scheduled on:06-28-21 Thursday Report to the Registration Desk on the 1st floor of the [facility].Then proceed to the 2nd floor Surgery Desk To find out your arrival time, please call [phone] between 1PM - 3PM on:06-27-21 Wednesday If your arrival time is 6:00 am, do not arrive prior to that time as the [facility] entrance doors do not open until 6:00 am.  REMEMBER: Instructions that are not followed completely may result in serious medical risk, up to and including death; or upon the discretion of your surgeon and anesthesiologist your surgery may need to be rescheduled.  Do not eat food after midnight the night before surgery.  No gum chewing, lozengers or hard candies.  You may however, drink CLEAR liquids up to 2 hours before you are scheduled to arrive for your surgery. Do not drink anything within 2 hours of your scheduled arrival time.  Clear liquids include: - water  - apple juice without pulp - gatorade (not RED colors) - black coffee or tea (Do NOT add milk or creamers to the coffee or tea) Do NOT drink anything that is not on this list.  TAKE THESE MEDICATIONS THE MORNING OF SURGERY WITH A SIP OF WATER: -atenolol (TENORMIN)  -citalopram (CELEXA) -levothyroxine (SYNTHROID)  -omeprazole (PRILOSEC)-take one the night before and one on the morning of surgery - helps to prevent nausea after surgery.) -You may take ALPRAZolam Duanne Moron) the morning of surgery if needed  Use your [facility] and Albuterol Inhaler the day of surgery and bring your Albuterol Inhaler to the hospital  Stop your 81 mg Aspirin NOW (06-20-21)   One week prior to surgery: Stop Anti-inflammatories (NSAIDS) such as Advil, Aleve, Ibuprofen, Motrin, Naproxen, Naprosyn and Aspirin based products such as Excedrin, Goodys Powder, BC Powder.You may however, take Tylenol if needed for pain up until the day of surgery.  Stop ANY OVER THE COUNTER supplements/vitamins NOW (06-20-21) until after  surgery (Vitamin D-K, Fish oil-Flax Oil-Borage Oil, Vitamin B12, Cinnamon) You may continue your Melatonin up until the night prior to your procedure  No Alcohol for 24 hours before or after surgery.  No Smoking including e-cigarettes for 24 hours prior to surgery.  No chewable tobacco products for at least 6 hours prior to surgery.  No nicotine patches on the day of surgery.  Do not use any "recreational" drugs for at least a week prior to your surgery.  Please be advised that the combination of cocaine and anesthesia may have negative outcomes, up to and including death. If you test positive for cocaine, your surgery will be cancelled.  On the morning of surgery brush your teeth with toothpaste and water, you may rinse your mouth with mouthwash if you wish. Do not swallow any toothpaste or mouthwash.  Use CHG Soap as directed on instruction sheet.  Do not wear jewelry, make-up, hairpins, clips or nail polish.  Do not wear lotions, powders, or perfumes.   Do not shave body from the neck down 48 hours prior to surgery just in case you cut yourself which could leave a site for infection.  Also, freshly shaved skin may become irritated if using the CHG soap.  Contact lenses, hearing aids and dentures may not be worn into surgery.  Do not bring valuables to the hospital. [facility] is not responsible for any missing/lost belongings or valuables.   Notify your doctor if there is any change in your medical condition (cold, fever, infection).  Wear comfortable clothing (specific to your surgery  type) to the hospital.  After surgery, you can help prevent lung complications by doing breathing exercises.  Take deep breaths and cough every 1-2 hours. Your doctor may order a device called an Incentive Spirometer to help you take deep breaths. When coughing or sneezing, hold a pillow firmly against your incision with both hands. This is called "splinting." Doing this helps protect your  incision. It also decreases belly discomfort.  If you are being admitted to the hospital overnight, leave your suitcase in the car. After surgery it may be brought to your room.  If you are being discharged the day of surgery, you will not be allowed to drive home. You will need a responsible adult (18 years or older) to drive you home and stay with you that night.   If you are taking public transportation, you will need to have a responsible adult (18 years or older) with you. Please confirm with your physician that it is acceptable to use public transportation.   Please call the [facility] at [phone] if you have any questions about these instructions.  Surgery Visitation Policy:  Patients undergoing a surgery or procedure may have two family members or support persons with them as long as the person is not COVID-19 positive or experiencing its symptoms.

## 2021-06-21 ENCOUNTER — Telehealth: Payer: Self-pay

## 2021-06-21 NOTE — Telephone Encounter (Signed)
Called to radiology to see what the issue was.  The only Heather down there was the supervisor.  I spoke with her and she had no idea what this was about.  Informed her to let us know if she finds out so that we can take care of the issue.  Notified the fron staff here in clinic to let us know if someone calls back about this so that we can handle it.

## 2021-06-22 ENCOUNTER — Encounter
Admission: RE | Admit: 2021-06-22 | Discharge: 2021-06-22 | Disposition: A | Discharge status: Home / Self Care | Payer: Medicare Other | Source: Ambulatory Visit | Attending: Pain Medicine | Admitting: Pain Medicine

## 2021-06-22 DIAGNOSIS — J45909 Unspecified asthma, uncomplicated: Secondary | ICD-10-CM | POA: Diagnosis not present

## 2021-06-22 DIAGNOSIS — Z01818 Encounter for other preprocedural examination: Secondary | ICD-10-CM | POA: Insufficient documentation

## 2021-06-22 DIAGNOSIS — Z79899 Other long term (current) drug therapy: Secondary | ICD-10-CM | POA: Diagnosis not present

## 2021-06-22 DIAGNOSIS — I10 Essential (primary) hypertension: Secondary | ICD-10-CM | POA: Insufficient documentation

## 2021-06-22 LAB — CBC
HCT: 40.7 % (ref 36.0–46.0)
Hemoglobin: 12.9 g/dL (ref 12.0–15.0)
MCH: 28.5 pg (ref 26.0–34.0)
MCHC: 31.7 g/dL (ref 30.0–36.0)
MCV: 90 fL (ref 80.0–100.0)
Platelets: 279 10*3/uL (ref 150–400)
RBC: 4.52 MIL/uL (ref 3.87–5.11)
RDW: 13.2 % (ref 11.5–15.5)
WBC: 6.7 10*3/uL (ref 4.0–10.5)
nRBC: 0 % (ref 0.0–0.2)

## 2021-06-22 LAB — BASIC METABOLIC PANEL
Anion gap: 8 (ref 5–15)
BUN: 20 mg/dL (ref 8–23)
CO2: 32 mmol/L (ref 22–32)
Calcium: 9.5 mg/dL (ref 8.9–10.3)
Chloride: 99 mmol/L (ref 98–111)
Creatinine, Ser: 0.63 mg/dL (ref 0.44–1.00)
GFR, Estimated: >60 mL/min (ref >60)
Glucose, Bld: 92 mg/dL (ref 70–99)
Potassium: 3.8 mmol/L (ref 3.5–5.1)
Sodium: 139 mmol/L (ref 135–145)

## 2021-06-28 ENCOUNTER — Other Ambulatory Visit: Payer: Self-pay

## 2021-06-28 ENCOUNTER — Ambulatory Visit: Payer: Medicare Other | Admitting: Urgent Care

## 2021-06-28 ENCOUNTER — Ambulatory Visit
Admission: RE | Admit: 2021-06-28 | Discharge: 2021-06-28 | Disposition: A | Discharge status: Home / Self Care | Payer: Medicare Other | Attending: Pain Medicine | Admitting: Pain Medicine

## 2021-06-28 ENCOUNTER — Encounter
Admission: RE | Disposition: A | Discharge status: Home / Self Care | Payer: Self-pay | Source: Home / Self Care | Attending: Pain Medicine

## 2021-06-28 ENCOUNTER — Encounter: Payer: Self-pay | Admitting: Pain Medicine

## 2021-06-28 ENCOUNTER — Ambulatory Visit: Payer: Medicare Other | Admitting: Registered Nurse

## 2021-06-28 DIAGNOSIS — I10 Essential (primary) hypertension: Secondary | ICD-10-CM | POA: Diagnosis not present

## 2021-06-28 DIAGNOSIS — G8929 Other chronic pain: Secondary | ICD-10-CM

## 2021-06-28 DIAGNOSIS — M961 Postlaminectomy syndrome, not elsewhere classified: Secondary | ICD-10-CM

## 2021-06-28 DIAGNOSIS — M545 Low back pain, unspecified: Secondary | ICD-10-CM | POA: Diagnosis not present

## 2021-06-28 DIAGNOSIS — G894 Chronic pain syndrome: Secondary | ICD-10-CM | POA: Diagnosis not present

## 2021-06-28 DIAGNOSIS — G2581 Restless legs syndrome: Secondary | ICD-10-CM | POA: Diagnosis not present

## 2021-06-28 DIAGNOSIS — M533 Sacrococcygeal disorders, not elsewhere classified: Secondary | ICD-10-CM | POA: Diagnosis not present

## 2021-06-28 DIAGNOSIS — M1611 Unilateral primary osteoarthritis, right hip: Secondary | ICD-10-CM | POA: Diagnosis not present

## 2021-06-28 DIAGNOSIS — E039 Hypothyroidism, unspecified: Secondary | ICD-10-CM | POA: Diagnosis not present

## 2021-06-28 DIAGNOSIS — M4317 Spondylolisthesis, lumbosacral region: Secondary | ICD-10-CM | POA: Diagnosis not present

## 2021-06-28 DIAGNOSIS — Z9682 Presence of neurostimulator: Secondary | ICD-10-CM | POA: Diagnosis not present

## 2021-06-28 DIAGNOSIS — M47812 Spondylosis without myelopathy or radiculopathy, cervical region: Secondary | ICD-10-CM | POA: Insufficient documentation

## 2021-06-28 DIAGNOSIS — Z87891 Personal history of nicotine dependence: Secondary | ICD-10-CM | POA: Diagnosis not present

## 2021-06-28 DIAGNOSIS — Z4542 Encounter for adjustment and management of neuropacemaker (brain) (peripheral nerve) (spinal cord): Secondary | ICD-10-CM | POA: Diagnosis not present

## 2021-06-28 DIAGNOSIS — K219 Gastro-esophageal reflux disease without esophagitis: Secondary | ICD-10-CM | POA: Insufficient documentation

## 2021-06-28 DIAGNOSIS — M549 Dorsalgia, unspecified: Secondary | ICD-10-CM | POA: Diagnosis not present

## 2021-06-28 DIAGNOSIS — M797 Fibromyalgia: Secondary | ICD-10-CM | POA: Insufficient documentation

## 2021-06-28 HISTORY — PX: LUMBAR SPINAL CORD SIMULATOR REVISION: SHX6811

## 2021-06-28 SURGERY — LUMBAR SPINAL CORD STIMULATOR REVISION
Anesthesia: Monitor Anesthesia Care | Site: Hip | Laterality: Left

## 2021-06-28 MED ORDER — MIDAZOLAM HCL 2 MG/2ML IJ SOLN
INTRAMUSCULAR | Status: AC
  Filled 2021-06-28: qty 2

## 2021-06-28 MED ORDER — HYDROGEN PEROXIDE 3 % EX SOLN
CUTANEOUS | Status: DC | PRN
  Administered 2021-06-28: 1

## 2021-06-28 MED ORDER — PROPOFOL 500 MG/50ML IV EMUL
INTRAVENOUS | Status: DC | PRN
  Administered 2021-06-28: 25 ug/kg/min via INTRAVENOUS

## 2021-06-28 MED ORDER — ACETAMINOPHEN 10 MG/ML IV SOLN
INTRAVENOUS | Status: DC | PRN
  Administered 2021-06-28: 1000 mg via INTRAVENOUS

## 2021-06-28 MED ORDER — OXYCODONE HCL 5 MG/5ML PO SOLN: 5.0000 mg | Freq: Once | ORAL | Status: DC | PRN

## 2021-06-28 MED ORDER — LIDOCAINE HCL 2 % IJ SOLN
20.0000 mL | Freq: Once | INTRAMUSCULAR | Status: DC
  Filled 2021-06-28: qty 20

## 2021-06-28 MED ORDER — OXYCODONE HCL 5 MG PO TABS: 5.0000 mg | ORAL_TABLET | Freq: Once | ORAL | Status: DC | PRN

## 2021-06-28 MED ORDER — SODIUM CHLORIDE (PF) 0.9 % IJ SOLN
INTRAMUSCULAR | Status: AC
  Filled 2021-06-28: qty 100

## 2021-06-28 MED ORDER — PROPOFOL 10 MG/ML IV BOLUS
INTRAVENOUS | Status: DC | PRN
  Administered 2021-06-28: 20 mg via INTRAVENOUS

## 2021-06-28 MED ORDER — VANCOMYCIN HCL IN DEXTROSE 1-5 GM/200ML-% IV SOLN: 1000.0000 mg | INTRAVENOUS | Status: AC

## 2021-06-28 MED ORDER — CHLORHEXIDINE GLUCONATE 0.12 % MT SOLN
OROMUCOSAL | Status: AC
  Administered 2021-06-28: 15 mL via OROMUCOSAL
  Filled 2021-06-28: qty 15

## 2021-06-28 MED ORDER — PROPOFOL 1000 MG/100ML IV EMUL
INTRAVENOUS | Status: AC
  Filled 2021-06-28: qty 100

## 2021-06-28 MED ORDER — ORAL CARE MOUTH RINSE: 15.0000 mL | Freq: Once | OROMUCOSAL | Status: AC

## 2021-06-28 MED ORDER — GELATIN ABSORBABLE 100 CM EX MISC
CUTANEOUS | Status: AC
  Filled 2021-06-28: qty 1

## 2021-06-28 MED ORDER — FENTANYL CITRATE (PF) 100 MCG/2ML IJ SOLN
INTRAMUSCULAR | Status: AC
  Filled 2021-06-28: qty 2

## 2021-06-28 MED ORDER — MIDAZOLAM HCL 2 MG/2ML IJ SOLN
INTRAMUSCULAR | Status: DC | PRN
  Administered 2021-06-28: 2 mg via INTRAVENOUS

## 2021-06-28 MED ORDER — FENTANYL CITRATE (PF) 100 MCG/2ML IJ SOLN: 25.0000 ug | INTRAMUSCULAR | Status: DC | PRN

## 2021-06-28 MED ORDER — LACTATED RINGERS IV SOLN: INTRAVENOUS | Status: DC

## 2021-06-28 MED ORDER — BACITRACIN ZINC 500 UNIT/GM EX OINT
TOPICAL_OINTMENT | CUTANEOUS | Status: AC
  Filled 2021-06-28: qty 28.35

## 2021-06-28 MED ORDER — LIDOCAINE HCL (PF) 1 % IJ SOLN
INTRAMUSCULAR | Status: AC
  Filled 2021-06-28: qty 30

## 2021-06-28 MED ORDER — DEXMEDETOMIDINE HCL IN NACL 200 MCG/50ML IV SOLN
INTRAVENOUS | Status: DC | PRN
  Administered 2021-06-28: 8 ug via INTRAVENOUS

## 2021-06-28 MED ORDER — CEPHALEXIN 500 MG PO CAPS: 500.0000 mg | ORAL_CAPSULE | Freq: Three times a day (TID) | ORAL | 0 refills | Status: AC

## 2021-06-28 MED ORDER — VANCOMYCIN HCL IN DEXTROSE 1-5 GM/200ML-% IV SOLN
INTRAVENOUS | Status: AC
  Administered 2021-06-28: 1000 mg via INTRAVENOUS
  Filled 2021-06-28: qty 200

## 2021-06-28 MED ORDER — PROMETHAZINE HCL 25 MG/ML IJ SOLN: 6.2500 mg | INTRAMUSCULAR | Status: DC | PRN

## 2021-06-28 MED ORDER — KETOROLAC TROMETHAMINE 30 MG/ML IJ SOLN
INTRAMUSCULAR | Status: DC | PRN
  Administered 2021-06-28: 30 mg via INTRAVENOUS

## 2021-06-28 MED ORDER — DEXMEDETOMIDINE HCL IN NACL 80 MCG/20ML IV SOLN
INTRAVENOUS | Status: AC
  Filled 2021-06-28: qty 20

## 2021-06-28 MED ORDER — ACETAMINOPHEN 10 MG/ML IV SOLN: 1000.0000 mg | Freq: Once | INTRAVENOUS | Status: DC | PRN

## 2021-06-28 MED ORDER — KETOROLAC TROMETHAMINE 30 MG/ML IJ SOLN
INTRAMUSCULAR | Status: AC
  Filled 2021-06-28: qty 1

## 2021-06-28 MED ORDER — LIDOCAINE HCL 1 % IJ SOLN
INTRAMUSCULAR | Status: DC | PRN
  Administered 2021-06-28: 20 mL via INTRAMUSCULAR

## 2021-06-28 MED ORDER — STERILE WATER FOR IRRIGATION IR SOLN
Status: DC | PRN
  Administered 2021-06-28: 1000 mL

## 2021-06-28 MED ORDER — BUPIVACAINE HCL (PF) 0.5 % IJ SOLN
INTRAMUSCULAR | Status: AC
  Filled 2021-06-28: qty 30

## 2021-06-28 MED ORDER — BACITRACIN ZINC 500 UNIT/GM EX OINT
TOPICAL_OINTMENT | CUTANEOUS | Status: DC | PRN
  Administered 2021-06-28: 1 via TOPICAL

## 2021-06-28 MED ORDER — CHLORHEXIDINE GLUCONATE 0.12 % MT SOLN: 15.0000 mL | Freq: Once | OROMUCOSAL | Status: AC

## 2021-06-28 MED ORDER — FENTANYL CITRATE (PF) 100 MCG/2ML IJ SOLN
INTRAMUSCULAR | Status: DC | PRN
  Administered 2021-06-28: 25 ug via INTRAVENOUS
  Administered 2021-06-28: 12.5 ug via INTRAVENOUS

## 2021-06-28 MED ORDER — ACETAMINOPHEN 10 MG/ML IV SOLN
INTRAVENOUS | Status: AC
  Filled 2021-06-28: qty 100

## 2021-06-28 SURGICAL SUPPLY — 41 items
BLADE SURG 15 STRL LF DISP TIS (BLADE) ×1 IMPLANT
BLADE SURG 15 STRL SS (BLADE) ×2
CONTROLLER INTELLIS PTM PAPER (NEUROSURGERY SUPPLIES) ×1 IMPLANT
DEVICE IMPLANT NEUROSTIMULATOR (Neuro Prosthesis/Implant) ×1 IMPLANT
DRAPE C-ARM XRAY 36X54 (DRAPES) ×2 IMPLANT
DRAPE CAMERA CLOSED 9X96 (DRAPES) ×2 IMPLANT
DRAPE INCISE IOBAN 66X45 STRL (DRAPES) ×2 IMPLANT
DRSG TEGADERM 4X4.75 (GAUZE/BANDAGES/DRESSINGS) ×4 IMPLANT
DRSG TELFA 3X8 NADH (GAUZE/BANDAGES/DRESSINGS) ×4 IMPLANT
DURAPREP 26ML APPLICATOR (WOUND CARE) ×2 IMPLANT
ELECT REM PT RETURN 9FT ADLT (ELECTROSURGICAL) ×2
ELECTRODE REM PT RTRN 9FT ADLT (ELECTROSURGICAL) ×1 IMPLANT
ENVELOPE ABSORB ANTIBACTERIAL (Mesh General) ×2 IMPLANT
GAUZE 4X4 16PLY ~~LOC~~+RFID DBL (SPONGE) ×2 IMPLANT
GLOVE BIO SURGEON STRL SZ8 (GLOVE) ×2 IMPLANT
GLOVE SURG XRAY 8.5 LX (GLOVE) ×2 IMPLANT
GOWN STRL REUS W/ TWL LRG LVL3 (GOWN DISPOSABLE) ×1 IMPLANT
GOWN STRL REUS W/ TWL XL LVL3 (GOWN DISPOSABLE) ×1 IMPLANT
GOWN STRL REUS W/TWL LRG LVL3 (GOWN DISPOSABLE) ×2
GOWN STRL REUS W/TWL XL LVL3 (GOWN DISPOSABLE) ×2
MANIFOLD NEPTUNE II (INSTRUMENTS) ×2 IMPLANT
NDL HYPO 25X1 1.5 SAFETY (NEEDLE) ×1 IMPLANT
NDL SPNL 22GX3.5 QUINCKE BK (NEEDLE) ×1 IMPLANT
NEEDLE HYPO 25X1 1.5 SAFETY (NEEDLE) ×2 IMPLANT
NEEDLE SPNL 22GX3.5 QUINCKE BK (NEEDLE) ×4 IMPLANT
PACK BASIN MINOR ARMC (MISCELLANEOUS) ×2 IMPLANT
PACK UNIVERSAL (MISCELLANEOUS) ×2 IMPLANT
PAD DRESSING TELFA 3X8 NADH (GAUZE/BANDAGES/DRESSINGS) ×2 IMPLANT
POUCH TYRX ANTIBAC NEURO MED (Mesh General) IMPLANT
RECHARGER INTELLIS (NEUROSURGERY SUPPLIES) ×1 IMPLANT
SOL PREP PVP 2OZ (MISCELLANEOUS) ×2
SOLUTION PREP PVP 2OZ (MISCELLANEOUS) ×1 IMPLANT
STAPLER SKIN PROX 35W (STAPLE) ×1 IMPLANT
SUT SILK 0 SH 30 (SUTURE) ×2 IMPLANT
SUT VIC AB 2-0 SH 27 (SUTURE) ×4
SUT VIC AB 2-0 SH 27XBRD (SUTURE) ×2 IMPLANT
SYR 10ML LL (SYRINGE) ×2 IMPLANT
SYR 3ML LL SCALE MARK (SYRINGE) ×2 IMPLANT
SYR TB 1ML 27GX1/2 LL (SYRINGE) ×2 IMPLANT
WATER STERILE IRR 1000ML POUR (IV SOLUTION) ×2 IMPLANT
WATER STERILE IRR 500ML POUR (IV SOLUTION) ×2 IMPLANT

## 2021-06-28 NOTE — Brief Op Note (Deleted)
Date: 06/28/2021  Time: 8:33 AM  Patient: [name]  (76 y.o. female) Diagnosis Pre-op: End of neurostimulator battery Post-op: Same Procedure: Replacement of neurostimulator battery Operating Room: [facility] 08  Surgeon: Milinda Pointer, MD  Assistant(s): None  Anesthesia: Monitor Anesthesia Care  Anesthesia Staff:  Anesthesiologist: Iran Ouch, MD CRNA: Lia Foyer, CRNA EBL: None/minimal Blood Administered: None Drains: None Meds ordered this encounter  Medications   vancomycin (VANCOCIN) IVPB 1000 mg/200 mL premix    Order Specific Question:   Indication:    Answer:   Surgical Prophylaxis   lidocaine (XYLOCAINE) 2 % (with pres) injection 400 mg   lactated ringers infusion   OR Linked Order Group    chlorhexidine (PERIDEX) 0.12 % solution 15 mL    MEDLINE mouth rinse   chlorhexidine (PERIDEX) 0.12 % solution    Hopkins, Erika W: cabinet override   vancomycin (VANCOCIN) 1-5 GM/200ML-% IVPB    Hopkins, Erika W: cabinet override   OR Linked Order Group    oxyCODONE (Oxy IR/ROXICODONE) immediate release tablet 5 mg    oxyCODONE (ROXICODONE) 5 MG/5ML solution 5 mg   fentaNYL (SUBLIMAZE) injection 25-50 mcg   acetaminophen (OFIRMEV) IV 1,000 mg    Order Specific Question:   IV acetaminophen for PACU patients (adjunct analgesic)    Answer:   Yes   promethazine (PHENERGAN) injection 6.25 mg   DISCONTD: bupivacaine(PF) (MARCAINE) 0.5 % 30 mL, lidocaine (XYLOCAINE) 1 % 30 mL   DISCONTD: hydrogen peroxide 3 % external solution   DISCONTD: sterile water for irrigation for irrigation   DISCONTD: bacitracin ointment   Specimen & disposition: * No specimens in log * Complete Count: YES Tourniquet: * No tourniquets in log *  Dictation: Note written in EPIC Plan of Care: Discharge to [facility]-Home-Health Care Svc after PACU  Patient Disposition: F/U at Pain Clinic as outpatient. Implants     Mesh General   Envelope Absorb Antibacterial - [id number] -  Implanted   (Left) Hip    Inventory item: ENVELOPE ABSORB ANTIBACTERIAL Model/Cat number: [id number]   Manufacturer: [employer] Lot number: [id number]    As of 06/28/2021     Status: Implanted               Neuro Prosthesis/Implant   Device Implant Neurostimulator - [id number] - Implanted   (Left) Hip    Inventory item: DEVICE IMPLANT NEUROSTIMULATOR Model/Cat number: [id number]   Serial number: [id number] Manufacturer: MEDTRONIC NEUROMOD PAIN MGMT    As of 06/28/2021     Status: Implanted               Spinal Cord Stimulator   Spinal Cord Stimulator - Implanted   (Left)    Model/Cat number: [id number]      As of 12/17/2016     Status: Implanted

## 2021-06-28 NOTE — Transfer of Care (Signed)
Immediate Anesthesia Transfer of Care Note  Patient: [name]  Procedure(s) Performed: LUMBAR SPINAL CORD SIMULATOR REVISION (Left: Hip)  Patient Location: PACU  Anesthesia Type:MAC and General  Level of Consciousness: drowsy  Airway & Oxygen Therapy: Patient Spontanous Breathing  Post-op Assessment: Report given to RN and Post -op Vital signs reviewed and stable  Post vital signs: Reviewed and stable  Last Vitals:  Vitals Value Taken Time  BP 112/81 06/28/21 0832  Temp    Pulse    Resp 25 06/28/21 0834  SpO2    Vitals shown include unvalidated device data.  Last Pain:  Vitals:   06/28/21 0619  TempSrc: Temporal  PainSc: 0-No pain         Complications: No notable events documented.

## 2021-06-28 NOTE — Discharge Instructions (Signed)

## 2021-06-28 NOTE — Interval H&P Note (Signed)
Patient's Name: [name]  MRN: [id number]  Referring Provider: No ref. provider found  DOB: February 08, 1945  PCP: Leonides Sake, MD  DOS: 04/24/2021  Note by: Gaspar Cola, MD  Service setting: Outpatient Same Day Surgery  Specialty: Interventional Pain Management  Patient type: Established  Location: [facility]  Encounter type: Pre-operative Evaluation   Admission Date & Time: 06/28/2021  6:01 AM  History and Physical Interval Note:  Ms. BRAELEY BUSKEY has presented today for surgery, with the diagnosis of : Battery end of life of spinal cord stimulator.  I have reviewed the patient's chart, labs, images, history and physical exam. No new additional findings. No change in status. Ms. Buttram is stable for surgery. The various alternatives of treatment have been discussed with the patient and family. The patient has been informed of the risks and possible complications. After consideration of risks, benefits and other options for treatment, the patient has consented to Procedure(s): [place]: [phone] (CPT), as a surgical intervention . All questions have been answered to the patient's satisfaction.  Plan: Proceed with surgery as planned.  OR Scheduled Time: 0715  Note by: Gaspar Cola, MD Date: 04/24/2021; Time: 8:49 AM

## 2021-06-28 NOTE — Anesthesia Postprocedure Evaluation (Signed)
Anesthesia Post Note  Patient: [name]  Procedure(s) Performed: LUMBAR SPINAL CORD SIMULATOR REVISION (Left: Hip)  Patient location during evaluation: PACU Anesthesia Type: MAC Level of consciousness: awake and alert Pain management: pain level controlled Vital Signs Assessment: post-procedure vital signs reviewed and stable Respiratory status: spontaneous breathing, nonlabored ventilation and respiratory function stable Cardiovascular status: blood pressure returned to baseline and stable Postop Assessment: no apparent nausea or vomiting Anesthetic complications: no   No notable events documented.   Last Vitals:  Vitals:   06/28/21 0900 06/28/21 0907  BP: (!) 120/57 (!) 154/74  Pulse: 62 66  Resp: 19 16  Temp: (!) 36.2 C (!) 36.2 C  SpO2: 98% 100%    Last Pain:  Vitals:   06/28/21 0907  TempSrc: Temporal  PainSc: 0-No pain                 Iran Ouch

## 2021-06-28 NOTE — Procedures (Addendum)
PROVIDER NOTE: Information contained herein reflects review and annotations entered in association with encounter. Interpretation of such information and data should be left to medically-trained personnel. Information provided to patient can be located elsewhere in the medical record under "Patient Instructions". Document created using STT-dictation technology, any transcriptional errors that may result from process are unintentional.    Patient: [name]  Service Category: Surgery  Provider: Gaspar Cola, MD  DOB: Jul 26, 1945  DOS: 04/24/2021  Location: [place]  MRN: [id number]  Setting: Ambulatory - outpatient  Referring Provider: No ref. provider found  Type: Established Patient  Specialty: Interventional Pain Management  PCP: Hamrick, Lorin Mercy, MD   Primary Reason for Visit: Interventional Pain Management Treatment. CC: No chief complaint on file.   Operative Report:     Type: Neurostimulator battery/receiver (IPG unit) replacement   Laterality: Left (-LT)  Level: Posterior PSIS region Imaging: Fluoroscopic guidance (< 60 min) Type: MAC (Monitor Anesthesia Care) Staff:  Anesthesiologist: Iran Ouch, MD CRNA: Lia Foyer, CRNA  Sedation: Meaningful verbal contact was maintained during the critical portions of the procedure  Local/Regional Analgesia: Local anesthetic infiltration by Surgeon Gaspar Cola, MD) Local Anesthetic: Bupivacaine 0.5% + Lidocaine 1.0% in a 50:50 Mix Indication(s): Analgesia Position: Prone Target: Neurostimulator Battery and Lead(s) (left buttocks area) Approach: Surgical.  Area Prepped: Entire Posterior Lumbosacral Region Prepping solution: ChloraPrep (2% chlorhexidine gluconate and 70% isopropyl alcohol) Region: Lumbosacral DOS: 04/24/2021  Performed by: Gaspar Cola, MD  Purpose: Therapeutic Indications: End of battery life on a functional epidural neurostimulator Rationale (medical  necessity): procedure needed and proper for the diagnosis and/or treatment of [name]'s medical symptoms and needs. 1. Battery end of life of spinal cord stimulator   2. Neurostimulator device in situ (Thoracolumbar) (Medtronic)    Pain Score: Pre-procedure: 0-No pain/10 Post-procedure: 0-No pain/10    Position / Prep / Materials:  Position: Prone  Prep solution: DuraPrep (Iodine Povacrylex [0.7% available iodine] and Isopropyl Alcohol, 74% w/w) Prep Area: Implant site.  Entire lumbosacral area Materials:  Tray: Surgical  Pre-op H&P Assessment:  [name] is a 76 y.o. (year old), female patient, seen today for interventional treatment. She  has a past surgical history that includes Vaginal hysterectomy (1975); Incontinence surgery (2001); Oophorectomy (1978); Elbow Arthroplasty (1990); Cholecystectomy (1989); Rectocele repair (2009); back implant (2015); Mastectomy partial / lumpectomy (Bilateral, 1978); Augmentation mammaplasty (Bilateral, 1978); Colonoscopy with propofol (N/A, 01/29/2017); Spinal cord stimulator implant; Esophagogastroduodenoscopy (egd) with propofol (N/A, 11/07/2017); Breast reconstruction; and Lumbar spinal cord simulator revision (Left, 06/28/2021). [name] _0 @ Her primarily concern today is the No chief complaint on file.  Initial Vital Signs:  Pulse/HCG Rate: 68 ECG Heart Rate: (!) 59 Temp: (!) 97.1 F (36.2 C) Resp: 18 BP: (!) 157/85 SpO2: 97 %  BMI: Estimated body mass index is 37.79 kg/m as calculated from the following:   Height as of this encounter: _1  (1.549 m).   Weight as of this encounter: 90.7 kg.  Risk Assessment: Allergies: Reviewed. She is allergic to aspirin, morphine, vicodin [hydrocodone-acetaminophen], niacin and related, and penicillins.  Allergy Precautions: None required Coagulopathies: Reviewed. None identified.  Blood-thinner therapy: None at this time Active Infection(s): Reviewed. None identified. [name]  is afebrile  Site Confirmation: [name] was asked to confirm the procedure and laterality before marking the site Procedure checklist: Completed Consent: Before the procedure and under the influence of no sedative(s), amnesic(s), or anxiolytics, the patient was informed of the treatment options,  risks and possible complications. To fulfill our ethical and legal obligations, as recommended by the American Medical Association's Code of Ethics, I have informed the patient of my clinical impression; the nature and purpose of the treatment or procedure; the risks, benefits, and possible complications of the intervention; the alternatives, including doing nothing; the risk(s) and benefit(s) of the alternative treatment(s) or procedure(s); and the risk(s) and benefit(s) of doing nothing. The patient was provided information about the general risks and possible complications associated with the procedure. These may include, but are not limited to: failure to achieve desired goals, infection, bleeding, organ or nerve damage, allergic reactions, paralysis, and death. In addition, the patient was informed of those risks and complications associated to the procedure, such as failure to decrease pain; infection; bleeding; organ or nerve damage with subsequent damage to sensory, motor, and/or autonomic systems, resulting in permanent pain, numbness, and/or weakness of one or several areas of the body; allergic reactions; (i.e.: anaphylactic reaction); and/or death. Furthermore, the patient was informed of those risks and complications associated with the medications. These include, but are not limited to: allergic reactions (i.e.: anaphylactic or anaphylactoid reaction(s)); adrenal axis suppression; blood sugar elevation that in diabetics may result in ketoacidosis or comma; water retention that in patients with history of congestive heart failure may result in shortness of breath, pulmonary edema, and decompensation  with resultant heart failure; weight gain; swelling or edema; medication-induced neural toxicity; particulate matter embolism and blood vessel occlusion with resultant organ, and/or nervous system infarction; and/or aseptic necrosis of one or more joints. Finally, the patient was informed that Medicine is not an exact science; therefore, there is also the possibility of unforeseen or unpredictable risks and/or possible complications that may result in a catastrophic outcome. The patient indicated having understood very clearly. We have given the patient no guarantees and we have made no promises. Enough time was given to the patient to ask questions, all of which were answered to the patient's satisfaction. Ms. Mahnken has indicated that she wanted to continue with the procedure. Attestation: I, the ordering provider, attest that I have discussed with the patient the benefits, risks, side-effects, alternatives, likelihood of achieving goals, and potential problems during recovery for the procedure that I have provided informed consent. Date  Time: 06/28/2021  6:12 AM  Description/Narrative of Procedure:          Specimen Collected: n/a Rationale (medical necessity): procedure needed and proper for the diagnosis and/or treatment of the patient's medical symptoms and needs. Procedural Technique Safety Precautions: Aspiration looking for blood return was conducted prior to all injections. At no point did we inject any substances, as a needle was being advanced. No attempts were made at seeking any paresthesias. Safe injection practices and needle disposal techniques used. Medications properly checked for expiration dates. SDV (single dose vial) medications used. Description of the Procedure: Protocol guidelines were followed. The patient was assisted into a comfortable position. The target area was identified and the area prepped in the usual manner. Skin & deeper tissues infiltrated with local anesthetic.  Appropriate amount of time allowed to pass for local anesthetics to take effect. The procedure needles were then advanced to the target area. Proper needle placement secured. Negative aspiration confirmed. Solution injected in intermittent fashion, asking for systemic symptoms every 0.5cc of injectate. The needles were then removed and the area cleansed, making sure to leave some of the prepping solution back to take advantage of its long term bactericidal properties.  Technical description of  procedure:  Times:  Scheduled start: 0715 Procedure:  7:30 AM Surgical: 0 Hr 40 Min 0 Sec  Description of procedure: The procedure site was prepped using a broad-spectrum topical antiseptic. The area was then draped in the usual and standard manner. "Time-out" was performed as per JC Universal Protocol (UP.01.01.01). Neurostimulator battery replacement: The location of the previously implanted battery was identified and marked. The previous scar was infiltrated with local anesthetic. An incision was made and the old generator was located and excised. Care was taken not to damage any of the leads. The generator was disconnected from the leads. At this point, the new generator was connected to the implanted leads and their impedance tested. After having confirmed proper working status, the new generator was placed back into the pocket. Once hemostasis was confirmed, both wounds were closed with Vicryl 2-0 after cleaning them with a solution containing 50:50 hydrogen peroxide and Betadine. Surgical staples were used to close the skin. The wounds were covered with sterile transparent bio-occlusive dressings, to easily assess any evidence of infection in the future.  The patient tolerated the entire procedure well. A repeat set of vitals were taken after the procedure and the patient was kept under observation until discharge criteria was met. The patient was provided with discharge instructions, including a section on  how to identify potential problems. Should any problems arise concerning this procedure, the patient was given instructions to immediately contact us, without hesitation. The neurostimulator representative and I, both provided the patient with our Business cards containing our contact telephone numbers, and instructed the patient to contact either one of [place], at any time, should there be any problems or questions. In any case, we plan to contact the patient by telephone for a follow-up status report regarding this interventional procedure.  Incision: Incision (Closed) 06/28/21 Back Left-Dressing Type:  (telfa, tegaderm) EBL: None/minimal Complications: * No complications entered in OR log *  OR Staff:  Circulator: Vena Rua, Donald Prose, RN; Alverda Skeans, RN Scrub Person: Alben Deeds Vendor Representative : Lance Sell, Adam  Safety Precautions: Informed consent obtained. Patient allergies reviewed. "Time-out" was performed as per JC Universal Protocol (UP.01.01.01). Strict sterile technique kept at all times. Fluoroscopy guidance used for placement accuracy and confirmation. Continuous Anesthesia monitoring maintained throughout the entire case. Pressure points and patient comfort assessed during initial patient positioning. Meaningful verbal contact maintained at all times during the critical portions of the procedure. Aspiration looking for blood return was conducted prior to all injections. At no point did we inject any substances, as a needle was being advanced. No attempts were made at seeking any paresthesias. Safe injection practices and needle disposal techniques used. Medications properly checked for expiration dates. SDV (single dose vial) medications used. Adequate hemostasis attained before wound closure.  Vitals:   06/28/21 0833 06/28/21 0845 06/28/21 0900 06/28/21 0907  BP: 112/81 122/82 (!) 120/57 (!) 154/74  Pulse:  60 62 66  Resp: _0 Temp: 97.8 F  (36.6 C)  (!) 97.1 F (36.2 C) (!) 97.1 F (36.2 C)  TempSrc:    Temporal  SpO2: 98% 97% 98% 100%  Weight:      Height:        Imaging Guidance (Non-Spinal):          Type of Imaging Technique: Fluoroscopy Guidance (Non-Spinal) Indication(s): Assistance in needle guidance and placement for procedures requiring needle placement in or near specific anatomical locations not easily accessible without such assistance. Exposure  Time: Please see nurses notes. Contrast: Before injecting any contrast, we confirmed that the patient did not have an allergy to iodine, shellfish, or radiological contrast. Once satisfactory needle placement was completed at the desired level, radiological contrast was injected. Contrast injected under live fluoroscopy. No contrast complications. See chart for type and volume of contrast used. Fluoroscopic Guidance: I was personally present during the use of fluoroscopy. "Tunnel Vision Technique" used to obtain the best possible view of the target area. Parallax error corrected before commencing the procedure. "Direction-depth-direction" technique used to introduce the needle under continuous pulsed fluoroscopy. Once target was reached, antero-posterior, oblique, and lateral fluoroscopic projection used confirm needle placement in all planes. Images permanently stored in EMR. Interpretation: I personally interpreted the imaging intraoperatively. Adequate needle placement confirmed in multiple planes. Appropriate spread of contrast into desired area was observed. No evidence of afferent or efferent intravascular uptake. Permanent images saved into the patient's record.  Plan of Care  Orders:  Orders Placed This Encounter  Procedures   Pre-admission testing diagnosis    Standing Status:   Standing    Number of Occurrences:   1    Order Specific Question:   Diagnosis    Answer:   Encounter for other preprocedural examination [Z01.818]   Pre-admission testing diagnosis     Standing Status:   Standing    Number of Occurrences:   1    Order Specific Question:   Diagnosis    Answer:   Encounter for other preprocedural examination [Z01.818]   Anesthesia Preoperative Order    Standing Status:   Standing    Number of Occurrences:   1   Follow-up    Special Instructions: Coordinate to have device representative available for visit    Scheduling Instructions:     Schedule post-implant follow-up visit.     Type: Face-to-face (F2F) Post-procedure (PP) evaluation (E/M) & removal of staples.     Timeframe: 6-7 days   EKG   Medications ordered for procedure: Meds ordered this encounter  Medications   vancomycin (VANCOCIN) IVPB 1000 mg/200 mL premix    Order Specific Question:   Indication:    Answer:   Surgical Prophylaxis   DISCONTD: lidocaine (XYLOCAINE) 2 % (with pres) injection 400 mg   DISCONTD: lactated ringers infusion   OR Linked Order Group    chlorhexidine (PERIDEX) 0.12 % solution 15 mL    MEDLINE mouth rinse   chlorhexidine (PERIDEX) 0.12 % solution    Hopkins, Erika W: cabinet override   vancomycin (VANCOCIN) 1-5 GM/200ML-% IVPB    Hopkins, Erika W: cabinet override   DISCONTD: oxyCODONE (Oxy IR/ROXICODONE) immediate release tablet 5 mg   DISCONTD: oxyCODONE (ROXICODONE) 5 MG/5ML solution 5 mg   DISCONTD: fentaNYL (SUBLIMAZE) injection 25-50 mcg   DISCONTD: acetaminophen (OFIRMEV) IV 1,000 mg    Order Specific Question:   IV acetaminophen for PACU patients (adjunct analgesic)    Answer:   Yes   DISCONTD: promethazine (PHENERGAN) injection 6.25 mg   DISCONTD: bupivacaine(PF) (MARCAINE) 0.5 % 30 mL, lidocaine (XYLOCAINE) 1 % 30 mL   DISCONTD: hydrogen peroxide 3 % external solution   DISCONTD: sterile water for irrigation for irrigation   DISCONTD: bacitracin ointment   cephALEXin (KEFLEX) 500 MG capsule    Sig: Take 1 capsule (500 mg total) by mouth 3 (three) times daily for 7 days.    Dispense:  21 capsule    Refill:  0    Do not place  medication on "Automatic Refill".  Operative Report:  Date of Surgery: 06/28/2021  Diagnosis: Pre-op: Battery end of life of spinal cord stimulator Post-op: back pain, battery died Surgery: [place] Surgeon: Milinda Pointer, MD   Neurostimulator Implant Details:  Battery:  Brand: Medtronic Location: Upper buttocks area, lateral to PSIS Laterality:  Left Model No.: [id number] SN: [id number]  MRI compatibility: Yes Recharging capability: Yes  Implant Name Type Inv. Item Serial No. Manufacturer Lot No. LRB No. 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  Post-operative Assessment:  Post-procedure Vital Signs:  Pulse/HCG Rate: 6674 Temp: (!) 97.1 F (36.2 C) Resp: 16 BP: (!) 154/74 SpO2: 250 %  Complications: No immediate post-treatment complications observed by team, or reported by patient.  Note: The patient tolerated the entire procedure well. A repeat set of vitals were taken after the procedure and the patient was kept under observation following institutional policy, for this type of procedure. Post-procedural neurological assessment was performed, showing return to baseline, prior to discharge. The patient was provided with post-procedure discharge instructions,  including a section on how to identify potential problems. Should any problems arise concerning this procedure, the patient was given instructions to immediately contact us, at any time, without hesitation. In any case, we plan to contact the patient by telephone for a follow-up status report regarding this interventional procedure.  Comments:  No additional relevant information.  Post-op Plan of Care  Disposition: Discharge home under the care of a responsible, capable, adult driver. Return to clinics in 6-10 days for post-procedure evaluation and removal of staples.  Discharge Date & Time: 06/28/2021  9:28 AM  Follow-up:  Recent Visits No visits were found meeting these conditions. Showing recent visits within past 90 days and meeting all other requirements Future Appointments No visits were found meeting these conditions. Showing future appointments within next 90 days and meeting all other requirements  Primary Care Physician: Leonides Sake, MD Location: [facility]  Note by: Gaspar Cola, MD Date: 04/24/2021; Time: 8:04 AM
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
## 2021-06-29 ENCOUNTER — Encounter: Payer: Self-pay | Admitting: Pain Medicine

## 2021-07-02 NOTE — Brief Op Note (Signed)
Date: 06/28/2021  Time: 8:07 AM  Patient: [name]  (76 y.o. female) Diagnosis Pre-op: back pain, battery died Post-op: back pain, battery died Procedure: Esophagogastroduodenoscopy (egd) With Propofol Operating Room: [facility] OR ROOM 08  Surgeon: Milinda Pointer, MD  Assistant(s): None  Anesthesia: Monitor Anesthesia Care  Anesthesia Staff:  Anesthesiologist: Iran Ouch, MD CRNA: Lia Foyer, CRNA EBL: None/minimal Blood Administered: None Drains: None Meds ordered this encounter  Medications   vancomycin (VANCOCIN) IVPB 1000 mg/200 mL premix    Order Specific Question:   Indication:    Answer:   Surgical Prophylaxis   DISCONTD: lidocaine (XYLOCAINE) 2 % (with pres) injection 400 mg   DISCONTD: lactated ringers infusion   OR Linked Order Group    chlorhexidine (PERIDEX) 0.12 % solution 15 mL    MEDLINE mouth rinse   chlorhexidine (PERIDEX) 0.12 % solution    Hopkins, Erika W: cabinet override   vancomycin (VANCOCIN) 1-5 GM/200ML-% IVPB    Hopkins, Erika W: cabinet override   DISCONTD: oxyCODONE (Oxy IR/ROXICODONE) immediate release tablet 5 mg   DISCONTD: oxyCODONE (ROXICODONE) 5 MG/5ML solution 5 mg   DISCONTD: fentaNYL (SUBLIMAZE) injection 25-50 mcg   DISCONTD: acetaminophen (OFIRMEV) IV 1,000 mg    Order Specific Question:   IV acetaminophen for PACU patients (adjunct analgesic)    Answer:   Yes   DISCONTD: promethazine (PHENERGAN) injection 6.25 mg   DISCONTD: bupivacaine(PF) (MARCAINE) 0.5 % 30 mL, lidocaine (XYLOCAINE) 1 % 30 mL   DISCONTD: hydrogen peroxide 3 % external solution   DISCONTD: sterile water for irrigation for irrigation   DISCONTD: bacitracin ointment   cephALEXin (KEFLEX) 500 MG capsule    Sig: Take 1 capsule (500 mg total) by mouth 3 (three) times daily for 7 days.    Dispense:  21 capsule    Refill:  0    Do not place medication on "Automatic Refill".   Specimen & disposition: * No specimens in log * Complete Count:  YES Tourniquet: * No tourniquets in log *  Dictation: Note written in EPIC Plan of Care: Discharge to 01-Home or Self Care after PACU  Patient Disposition: F/U at Pain Clinic as outpatient. Implants     Mesh General   Envelope Absorb Antibacterial - [id number] - Implanted   (Left) Hip    Inventory item: ENVELOPE ABSORB ANTIBACTERIAL Model/Cat number: [id number]   Manufacturer: [employer] [place] [employer] Lot number: [id number]    As of 06/28/2021     Status: Implanted               Neuro Prosthesis/Implant   Device Implant Neurostimulator - [id number] - Implanted   (Left) Hip    Inventory item: DEVICE IMPLANT NEUROSTIMULATOR Model/Cat number: [id number]   Serial number: [id number] Manufacturer: MEDTRONIC NEUROMOD PAIN MGMT    As of 06/28/2021     Status: Implanted

## 2021-07-03 ENCOUNTER — Other Ambulatory Visit: Payer: Self-pay | Admitting: Family Medicine

## 2021-07-03 DIAGNOSIS — Z1231 Encounter for screening mammogram for malignant neoplasm of breast: Secondary | ICD-10-CM

## 2021-07-09 DIAGNOSIS — E785 Hyperlipidemia, unspecified: Secondary | ICD-10-CM | POA: Diagnosis not present

## 2021-07-09 DIAGNOSIS — J45901 Unspecified asthma with (acute) exacerbation: Secondary | ICD-10-CM | POA: Diagnosis not present

## 2021-07-09 DIAGNOSIS — Z0000 Encounter for general adult medical examination without abnormal findings: Secondary | ICD-10-CM | POA: Diagnosis not present

## 2021-07-09 DIAGNOSIS — Z9181 History of falling: Secondary | ICD-10-CM | POA: Diagnosis not present

## 2021-07-10 ENCOUNTER — Other Ambulatory Visit: Payer: Self-pay

## 2021-07-10 ENCOUNTER — Ambulatory Visit: Payer: Medicare Other | Attending: Pain Medicine | Admitting: Pain Medicine

## 2021-07-10 ENCOUNTER — Encounter: Payer: Self-pay | Admitting: Pain Medicine

## 2021-07-10 VITALS — BP 146/79 | HR 68 | Temp 96.8°F | Resp 16 | Ht 61.0 in | Wt 200.0 lb

## 2021-07-10 DIAGNOSIS — M961 Postlaminectomy syndrome, not elsewhere classified: Secondary | ICD-10-CM | POA: Diagnosis not present

## 2021-07-10 DIAGNOSIS — Z4542 Encounter for adjustment and management of neuropacemaker (brain) (peripheral nerve) (spinal cord): Secondary | ICD-10-CM | POA: Diagnosis not present

## 2021-07-10 DIAGNOSIS — M79605 Pain in left leg: Secondary | ICD-10-CM

## 2021-07-10 DIAGNOSIS — M48061 Spinal stenosis, lumbar region without neurogenic claudication: Secondary | ICD-10-CM

## 2021-07-10 DIAGNOSIS — M545 Low back pain, unspecified: Secondary | ICD-10-CM

## 2021-07-10 DIAGNOSIS — Z969 Presence of functional implant, unspecified: Secondary | ICD-10-CM | POA: Diagnosis not present

## 2021-07-10 DIAGNOSIS — M4310 Spondylolisthesis, site unspecified: Secondary | ICD-10-CM | POA: Diagnosis not present

## 2021-07-10 DIAGNOSIS — G8929 Other chronic pain: Secondary | ICD-10-CM

## 2021-07-10 DIAGNOSIS — Z4802 Encounter for removal of sutures: Secondary | ICD-10-CM | POA: Diagnosis not present

## 2021-07-10 DIAGNOSIS — Z4889 Encounter for other specified surgical aftercare: Secondary | ICD-10-CM | POA: Diagnosis not present

## 2021-07-10 DIAGNOSIS — M5136 Other intervertebral disc degeneration, lumbar region: Secondary | ICD-10-CM | POA: Diagnosis not present

## 2021-07-10 DIAGNOSIS — M51369 Other intervertebral disc degeneration, lumbar region without mention of lumbar back pain or lower extremity pain: Secondary | ICD-10-CM

## 2021-07-10 DIAGNOSIS — G894 Chronic pain syndrome: Secondary | ICD-10-CM

## 2021-07-10 DIAGNOSIS — M47816 Spondylosis without myelopathy or radiculopathy, lumbar region: Secondary | ICD-10-CM | POA: Diagnosis not present

## 2021-07-10 NOTE — Progress Notes (Deleted)
Safety precautions to be maintained throughout the outpatient stay will include: orient to surroundings, keep bed in low position, maintain call bell within reach at all times, provide assistance with transfer out of bed and ambulation.  
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
## 2021-07-10 NOTE — Progress Notes (Signed)
Nursing Pain Medication Assessment:  Safety precautions to be maintained throughout the outpatient stay will include: orient to surroundings, keep bed in low position, maintain call bell within reach at all times, provide assistance with transfer out of bed and ambulation.  Medication Inspection Compliance: Pill count conducted under aseptic conditions, in front of the patient. Neither the pills nor the bottle was removed from the patient's sight at any time. Once count was completed pills were immediately returned to the patient in their original bottle.  Medication: Tramadol (Ultram) Pill/Patch Count:  47 of 60 pills remain Pill/Patch Appearance: Markings consistent with prescribed medication Bottle Appearance: Standard pharmacy container. Clearly labeled. Filled Date: 03 / 27 / 2023 Last Medication intake:   has not needed to take in a long time.

## 2021-07-10 NOTE — Progress Notes (Signed)
Staples removed,  incision line looks healthy and well approximated.  Area cleaned, benzoin applied and steri strips to incision site. FN in for assessment.

## 2021-07-16 ENCOUNTER — Telehealth: Payer: Self-pay

## 2021-07-16 NOTE — Telephone Encounter (Signed)
Patient states that she has some redness at steri strips from the irritation.  States that they are rolling up and are halfway off.  States she thinks she can leave them on until they come off.  Informed her to call us if she cant tolerate the steri strips and we will notify Dr Dossie Arbour.

## 2021-07-16 NOTE — Telephone Encounter (Signed)
She is broke out in blisters around the steri strips. Not on incision but all around. She thinks it comes from the strips.

## 2021-08-06 ENCOUNTER — Ambulatory Visit
Admission: RE | Admit: 2021-08-06 | Discharge: 2021-08-06 | Disposition: A | Discharge status: Home / Self Care | Payer: Medicare Other | Source: Ambulatory Visit | Attending: Family Medicine | Admitting: Family Medicine

## 2021-08-06 DIAGNOSIS — H1045 Other chronic allergic conjunctivitis: Secondary | ICD-10-CM | POA: Diagnosis not present

## 2021-08-06 DIAGNOSIS — H35033 Hypertensive retinopathy, bilateral: Secondary | ICD-10-CM | POA: Diagnosis not present

## 2021-08-06 DIAGNOSIS — H353131 Nonexudative age-related macular degeneration, bilateral, early dry stage: Secondary | ICD-10-CM | POA: Diagnosis not present

## 2021-08-06 DIAGNOSIS — Z1231 Encounter for screening mammogram for malignant neoplasm of breast: Secondary | ICD-10-CM | POA: Insufficient documentation

## 2021-09-13 DIAGNOSIS — J45909 Unspecified asthma, uncomplicated: Secondary | ICD-10-CM | POA: Diagnosis not present

## 2021-09-13 DIAGNOSIS — R7303 Prediabetes: Secondary | ICD-10-CM | POA: Diagnosis not present

## 2021-09-13 DIAGNOSIS — R202 Paresthesia of skin: Secondary | ICD-10-CM | POA: Diagnosis not present

## 2021-09-13 DIAGNOSIS — E039 Hypothyroidism, unspecified: Secondary | ICD-10-CM | POA: Diagnosis not present

## 2021-09-13 DIAGNOSIS — J449 Chronic obstructive pulmonary disease, unspecified: Secondary | ICD-10-CM | POA: Diagnosis not present

## 2021-09-13 DIAGNOSIS — E782 Mixed hyperlipidemia: Secondary | ICD-10-CM | POA: Diagnosis not present

## 2021-09-13 DIAGNOSIS — M25562 Pain in left knee: Secondary | ICD-10-CM | POA: Diagnosis not present

## 2021-09-13 DIAGNOSIS — I10 Essential (primary) hypertension: Secondary | ICD-10-CM | POA: Diagnosis not present

## 2021-09-13 DIAGNOSIS — Z79899 Other long term (current) drug therapy: Secondary | ICD-10-CM | POA: Diagnosis not present

## 2021-09-27 DIAGNOSIS — R6889 Other general symptoms and signs: Secondary | ICD-10-CM | POA: Diagnosis not present

## 2021-09-27 DIAGNOSIS — J45909 Unspecified asthma, uncomplicated: Secondary | ICD-10-CM | POA: Diagnosis not present

## 2021-09-27 DIAGNOSIS — J449 Chronic obstructive pulmonary disease, unspecified: Secondary | ICD-10-CM | POA: Diagnosis not present

## 2021-11-02 DIAGNOSIS — R0602 Shortness of breath: Secondary | ICD-10-CM | POA: Diagnosis not present

## 2021-11-02 DIAGNOSIS — J4520 Mild intermittent asthma, uncomplicated: Secondary | ICD-10-CM | POA: Diagnosis not present

## 2021-11-02 DIAGNOSIS — R058 Other specified cough: Secondary | ICD-10-CM | POA: Diagnosis not present

## 2021-11-02 DIAGNOSIS — R059 Cough, unspecified: Secondary | ICD-10-CM | POA: Diagnosis not present

## 2021-11-02 DIAGNOSIS — Z23 Encounter for immunization: Secondary | ICD-10-CM | POA: Diagnosis not present

## 2021-11-08 DIAGNOSIS — R058 Other specified cough: Secondary | ICD-10-CM | POA: Diagnosis not present

## 2021-11-08 DIAGNOSIS — Z23 Encounter for immunization: Secondary | ICD-10-CM | POA: Diagnosis not present

## 2021-11-08 DIAGNOSIS — R0602 Shortness of breath: Secondary | ICD-10-CM | POA: Diagnosis not present

## 2021-11-08 DIAGNOSIS — J4520 Mild intermittent asthma, uncomplicated: Secondary | ICD-10-CM | POA: Diagnosis not present

## 2021-11-13 DIAGNOSIS — M1712 Unilateral primary osteoarthritis, left knee: Secondary | ICD-10-CM | POA: Diagnosis not present

## 2021-11-21 DIAGNOSIS — M1712 Unilateral primary osteoarthritis, left knee: Secondary | ICD-10-CM | POA: Diagnosis not present

## 2021-11-28 DIAGNOSIS — M1712 Unilateral primary osteoarthritis, left knee: Secondary | ICD-10-CM | POA: Diagnosis not present

## 2021-12-05 DIAGNOSIS — M1712 Unilateral primary osteoarthritis, left knee: Secondary | ICD-10-CM | POA: Diagnosis not present

## 2021-12-14 DIAGNOSIS — J301 Allergic rhinitis due to pollen: Secondary | ICD-10-CM | POA: Diagnosis not present

## 2021-12-14 DIAGNOSIS — J4520 Mild intermittent asthma, uncomplicated: Secondary | ICD-10-CM | POA: Diagnosis not present

## 2021-12-19 DIAGNOSIS — E782 Mixed hyperlipidemia: Secondary | ICD-10-CM | POA: Diagnosis not present

## 2021-12-19 DIAGNOSIS — E039 Hypothyroidism, unspecified: Secondary | ICD-10-CM | POA: Diagnosis not present

## 2021-12-19 DIAGNOSIS — J449 Chronic obstructive pulmonary disease, unspecified: Secondary | ICD-10-CM | POA: Diagnosis not present

## 2021-12-19 DIAGNOSIS — J45909 Unspecified asthma, uncomplicated: Secondary | ICD-10-CM | POA: Diagnosis not present

## 2021-12-19 DIAGNOSIS — I10 Essential (primary) hypertension: Secondary | ICD-10-CM | POA: Diagnosis not present

## 2021-12-19 DIAGNOSIS — F419 Anxiety disorder, unspecified: Secondary | ICD-10-CM | POA: Diagnosis not present

## 2021-12-19 DIAGNOSIS — Z79899 Other long term (current) drug therapy: Secondary | ICD-10-CM | POA: Diagnosis not present

## 2021-12-19 DIAGNOSIS — F325 Major depressive disorder, single episode, in full remission: Secondary | ICD-10-CM | POA: Diagnosis not present

## 2021-12-19 DIAGNOSIS — R7303 Prediabetes: Secondary | ICD-10-CM | POA: Diagnosis not present

## 2022-01-02 ENCOUNTER — Ambulatory Visit: Payer: Medicare PPO | Admitting: Dermatology

## 2022-01-02 DIAGNOSIS — L820 Inflamed seborrheic keratosis: Secondary | ICD-10-CM | POA: Diagnosis not present

## 2022-01-02 DIAGNOSIS — I781 Nevus, non-neoplastic: Secondary | ICD-10-CM

## 2022-01-02 DIAGNOSIS — L570 Actinic keratosis: Secondary | ICD-10-CM

## 2022-01-02 DIAGNOSIS — L821 Other seborrheic keratosis: Secondary | ICD-10-CM | POA: Diagnosis not present

## 2022-01-02 DIAGNOSIS — L578 Other skin changes due to chronic exposure to nonionizing radiation: Secondary | ICD-10-CM

## 2022-01-02 NOTE — Progress Notes (Signed)
Follow-Up Visit   Subjective  [name] is a 76 y.o. female who presents for the following: Irregular skin lesions (Irregular, irritated, scratches and bleeds, patient is concerned and would like checked and treated - located on the face and shoulders).  The following portions of the chart were reviewed this encounter and updated as appropriate:   Tobacco  Allergies  Meds  Problems  Med Hx  Surg Hx  Fam Hx     Review of Systems:  No other skin or systemic complaints except as noted in HPI or Assessment and Plan.  Objective  Well appearing patient in no apparent distress; mood and affect are within normal limits.  A focused examination was performed including the face, trunk, and ears. Relevant physical exam findings are noted in the Assessment and Plan.  L cheek x 1, L forehead x 1, R nose x 1, L top of shoulder x 1 (4) Erythematous stuck-on, waxy papule or plaque  L brow x 1, R ear x 1 (2) Erythematous thin papules/macules with gritty scale.   L distal dorsum nose, L nose Dilated vessel.   Assessment & Plan  Inflamed seborrheic keratosis (4) L cheek x 1, L forehead x 1, R nose x 1, L top of shoulder x 1 Symptomatic, irritating, patient would like treated. Destruction of lesion - L cheek x 1, L forehead x 1, R nose x 1, L top of shoulder x 1 Complexity: simple   Destruction method: cryotherapy   Informed consent: discussed and consent obtained   Timeout:  patient name, date of birth, surgical site, and procedure verified Lesion destroyed using liquid nitrogen: Yes   Region frozen until ice ball extended beyond lesion: Yes   Outcome: patient tolerated procedure well with no complications   Post-procedure details: wound care instructions given    AK (actinic keratosis) (2) L brow x 1, R ear x 1 RTC if not resolved by two  months.  Destruction of lesion - L brow x 1, R ear x 1 Complexity: simple   Destruction method: cryotherapy   Informed consent:  discussed and consent obtained   Timeout:  patient name, date of birth, surgical site, and procedure verified Lesion destroyed using liquid nitrogen: Yes   Region frozen until ice ball extended beyond lesion: Yes   Outcome: patient tolerated procedure well with no complications   Post-procedure details: wound care instructions given    Telangiectasia L distal dorsum nose, L nose Discussed the treatment option of BBL/laser.  Typically we recommend 1-3 treatment sessions about 5-8 weeks apart for best results.  The patient's condition may require "maintenance treatments" in the future.  The fee for BBL / laser treatments is $350 per treatment session for the whole face.  A fee can be quoted for other parts of the body. Insurance typically does not pay for BBL/laser treatments and therefore the fee is an out-of-pocket cost.  Actinic Damage - chronic, secondary to cumulative UV radiation exposure/sun exposure over time - diffuse scaly erythematous macules with underlying dyspigmentation - Recommend daily broad spectrum sunscreen SPF 30+ to sun-exposed areas, reapply every 2 hours as needed.  - Recommend staying in the shade or wearing long sleeves, sun glasses (UVA+UVB protection) and wide brim hats (4-inch brim around the entire circumference of the hat). - Call for new or changing lesions.  Seborrheic Keratoses - Stuck-on, waxy, tan-brown papules and/or plaques  - Benign-appearing - Discussed benign etiology and prognosis. - Observe - Call for any changes  Return in about 1 year (around 01/03/2023) for AK and ISK follow up.  Luther Redo, CMA, am acting as scribe for Sarina Ser, MD . Documentation: I have reviewed the above documentation for accuracy and completeness, and I agree with the above.  Sarina Ser, MD

## 2022-01-02 NOTE — Patient Instructions (Signed)
Due to recent changes in healthcare laws, you may see results of your pathology and/or laboratory studies on MyChart before the doctors have had a chance to review them. We understand that in some cases there may be results that are confusing or concerning to you. Please understand that not all results are received at the same time and often the doctors may need to interpret multiple results in order to provide you with the best plan of care or course of treatment. Therefore, we ask that you please give us 2 business days to thoroughly review all your results before contacting the office for clarification. Should we see a critical lab result, you will be contacted sooner.   If You Need Anything After Your Visit  If you have any questions or concerns for your doctor, please call our main line at [phone] and press option 4 to reach your doctor's medical assistant. If no one answers, please leave a voicemail as directed and we will return your call as soon as possible. Messages left after 4 pm will be answered the following business day.   You may also send us a message via MyChart. We typically respond to MyChart messages within 1-2 business days.  For prescription refills, please ask your pharmacy to contact our office. Our fax number is [phone].  If you have an urgent issue when the clinic is closed that cannot wait until the next business day, you can page your doctor at the number below.    Please note that while we do our best to be available for urgent issues outside of office hours, we are not available 24/7.   If you have an urgent issue and are unable to reach us, you may choose to seek medical care at your doctor's office, retail clinic, urgent care center, or emergency room.  If you have a medical emergency, please immediately call 911 or go to the emergency department.  Pager Numbers  - Dr. Kowalski: [phone]  - Dr. Moye: [phone]  - Dr. Stewart:  [phone]  In the event of inclement weather, please call our main line at [phone] for an update on the status of any delays or closures.  Dermatology Medication Tips: Please keep the boxes that topical medications come in in order to help keep track of the instructions about where and how to use these. Pharmacies typically print the medication instructions only on the boxes and not directly on the medication tubes.   If your medication is too expensive, please contact our office at [phone] option 4 or send us a message through MyChart.   We are unable to tell what your co-pay for medications will be in advance as this is different depending on your insurance coverage. However, we may be able to find a substitute medication at lower cost or fill out paperwork to get insurance to cover a needed medication.   If a prior authorization is required to get your medication covered by your insurance company, please allow us 1-2 business days to complete this process.  Drug prices often vary depending on where the prescription is filled and some pharmacies may offer cheaper prices.  The website www.goodrx.com contains coupons for medications through different pharmacies. The prices here do not account for what the cost may be with help from insurance (it may be cheaper with your insurance), but the website can give you the price if you did not use any insurance.  - You can print the associated coupon and take it with   your prescription to the pharmacy.  - You may also stop by our office during regular business hours and pick up a GoodRx coupon card.  - If you need your prescription sent electronically to a different pharmacy, notify our office through [facility] MyChart or by phone at [phone] option 4.     Si Usted Necesita Algo Despus de Su Visita  Tambin puede enviarnos un mensaje a travs de MyChart. Por lo general respondemos a los mensajes de MyChart en el transcurso de 1 a 2  das hbiles.  Para renovar recetas, por favor pida a su farmacia que se ponga en contacto con nuestra oficina. Nuestro nmero de fax es el [phone].  Si tiene un asunto urgente cuando la clnica est cerrada y que no puede esperar hasta el siguiente da hbil, puede llamar/localizar a su doctor(a) al nmero que aparece a continuacin.   Por favor, tenga en cuenta que aunque hacemos todo lo posible para estar disponibles para asuntos urgentes fuera del horario de oficina, no estamos disponibles las 24 horas del da, los 7 das de la semana.   Si tiene un problema urgente y no puede comunicarse con nosotros, puede optar por buscar atencin mdica  en el consultorio de su doctor(a), en una clnica privada, en un centro de atencin urgente o en una sala de emergencias.  Si tiene una emergencia mdica, por favor llame inmediatamente al 911 o vaya a la sala de emergencias.  Nmeros de bper  - Dr. Kowalski: [phone]  - Dra. Moye: [phone]  - Dra. Stewart: [phone]  En caso de inclemencias del tiempo, por favor llame a nuestra lnea principal al [phone] para una actualizacin sobre el estado de cualquier retraso o cierre.  Consejos para la medicacin en dermatologa: Por favor, guarde las cajas en las que vienen los medicamentos de uso tpico para ayudarle a seguir las instrucciones sobre dnde y cmo usarlos. Las farmacias generalmente imprimen las instrucciones del medicamento slo en las cajas y no directamente en los tubos del medicamento.   Si su medicamento es muy caro, por favor, pngase en contacto con nuestra oficina llamando al [phone] y presione la opcin 4 o envenos un mensaje a travs de MyChart.   No podemos decirle cul ser su copago por los medicamentos por adelantado ya que esto es diferente dependiendo de la cobertura de su seguro. Sin embargo, es posible que podamos encontrar un medicamento sustituto a menor costo o llenar un formulario para que el  seguro cubra el medicamento que se considera necesario.   Si se requiere una autorizacin previa para que su compaa de seguros cubra su medicamento, por favor permtanos de 1 a 2 das hbiles para completar este proceso.  Los precios de los medicamentos varan con frecuencia dependiendo del lugar de dnde se surte la receta y alguna farmacias pueden ofrecer precios ms baratos.  El sitio web www.goodrx.com tiene cupones para medicamentos de diferentes farmacias. Los precios aqu no tienen en cuenta lo que podra costar con la ayuda del seguro (puede ser ms barato con su seguro), pero el sitio web puede darle el precio si no utiliz ningn seguro.  - Puede imprimir el cupn correspondiente y llevarlo con su receta a la farmacia.  - Tambin puede pasar por nuestra oficina durante el horario de atencin regular y recoger una tarjeta de cupones de GoodRx.  - Si necesita que su receta se enve electrnicamente a una farmacia diferente, informe a nuestra oficina a travs de MyChart de [facility]   o por telfono llamando al [phone] y presione la opcin 4.  

## 2022-01-13 ENCOUNTER — Encounter: Payer: Self-pay | Admitting: Dermatology

## 2022-02-08 ENCOUNTER — Telehealth: Payer: Self-pay

## 2022-02-08 NOTE — Patient Outreach (Signed)
  Care Coordination   02/08/2022 Name: [name] MRN: [id number] DOB: [birth year]   Care Coordination Outreach Attempts:  An unsuccessful telephone outreach was attempted today to offer the patient information about available care coordination services as a benefit of their health plan.   Follow Up Plan:  Additional outreach attempts will be made to offer the patient care coordination information and services.   Encounter Outcome:  No Answer   Care Coordination Interventions:  No, not indicated    Tomasa Rand, RN, BSN, [facility] [employer] [phone]

## 2022-02-13 ENCOUNTER — Telehealth: Payer: Self-pay

## 2022-02-13 NOTE — Patient Outreach (Signed)
  Care Coordination   02/13/2022 Name: [name] MRN: [id number] DOB: Mar 05, 1945   Care Coordination Outreach Attempts:  A second unsuccessful outreach was attempted today to offer the patient with information about available care coordination services as a benefit of their health plan.     Follow Up Plan:  Additional outreach attempts will be made to offer the patient care coordination information and services.   Encounter Outcome:  No Answer   Care Coordination Interventions:  No, not indicated    Tomasa Rand, RN, BSN, [facility] [employer] [phone]

## 2022-02-26 ENCOUNTER — Telehealth: Payer: Self-pay

## 2022-02-26 NOTE — Patient Outreach (Signed)
  Care Coordination   02/26/2022 Name: [name] MRN: [id number] DOB: 1945-01-31   Care Coordination Outreach Attempts:  A third unsuccessful outreach was attempted today to offer the patient with information about available care coordination services as a benefit of their health plan.   Follow Up Plan:  No further outreach attempts will be made at this time. We have been unable to contact the patient to offer or enroll patient in care coordination services  Encounter Outcome:  No Answer   Care Coordination Interventions:  No, not indicated    Tomasa Rand, RN, BSN, [facility] Coordinator [phone]

## 2022-03-15 DIAGNOSIS — G8929 Other chronic pain: Secondary | ICD-10-CM | POA: Diagnosis not present

## 2022-03-15 DIAGNOSIS — M7522 Bicipital tendinitis, left shoulder: Secondary | ICD-10-CM | POA: Diagnosis not present

## 2022-03-15 DIAGNOSIS — M19012 Primary osteoarthritis, left shoulder: Secondary | ICD-10-CM | POA: Diagnosis not present

## 2022-03-19 DIAGNOSIS — M1712 Unilateral primary osteoarthritis, left knee: Secondary | ICD-10-CM | POA: Diagnosis not present

## 2022-04-19 DIAGNOSIS — J449 Chronic obstructive pulmonary disease, unspecified: Secondary | ICD-10-CM | POA: Diagnosis not present

## 2022-04-19 DIAGNOSIS — I10 Essential (primary) hypertension: Secondary | ICD-10-CM | POA: Diagnosis not present

## 2022-04-19 DIAGNOSIS — F325 Major depressive disorder, single episode, in full remission: Secondary | ICD-10-CM | POA: Diagnosis not present

## 2022-04-19 DIAGNOSIS — E782 Mixed hyperlipidemia: Secondary | ICD-10-CM | POA: Diagnosis not present

## 2022-04-19 DIAGNOSIS — F419 Anxiety disorder, unspecified: Secondary | ICD-10-CM | POA: Diagnosis not present

## 2022-04-19 DIAGNOSIS — E039 Hypothyroidism, unspecified: Secondary | ICD-10-CM | POA: Diagnosis not present

## 2022-04-19 DIAGNOSIS — R7303 Prediabetes: Secondary | ICD-10-CM | POA: Diagnosis not present

## 2022-04-19 DIAGNOSIS — M5431 Sciatica, right side: Secondary | ICD-10-CM | POA: Diagnosis not present

## 2022-04-19 DIAGNOSIS — Z79899 Other long term (current) drug therapy: Secondary | ICD-10-CM | POA: Diagnosis not present

## 2022-05-08 DIAGNOSIS — M7061 Trochanteric bursitis, right hip: Secondary | ICD-10-CM | POA: Diagnosis not present

## 2022-05-08 DIAGNOSIS — M1711 Unilateral primary osteoarthritis, right knee: Secondary | ICD-10-CM | POA: Diagnosis not present

## 2022-05-31 DIAGNOSIS — R7309 Other abnormal glucose: Secondary | ICD-10-CM | POA: Diagnosis not present

## 2022-05-31 DIAGNOSIS — M1712 Unilateral primary osteoarthritis, left knee: Secondary | ICD-10-CM | POA: Diagnosis not present

## 2022-06-04 ENCOUNTER — Other Ambulatory Visit: Payer: Self-pay | Admitting: Orthopedic Surgery

## 2022-06-14 ENCOUNTER — Other Ambulatory Visit: Payer: Self-pay

## 2022-06-14 ENCOUNTER — Encounter
Admission: RE | Admit: 2022-06-14 | Discharge: 2022-06-14 | Disposition: A | Discharge status: Home / Self Care | Payer: Medicare PPO | Source: Ambulatory Visit | Attending: Orthopedic Surgery | Admitting: Orthopedic Surgery

## 2022-06-14 VITALS — BP 138/80 | HR 69 | Resp 16 | Ht 61.0 in | Wt 198.2 lb

## 2022-06-14 DIAGNOSIS — Z01812 Encounter for preprocedural laboratory examination: Secondary | ICD-10-CM

## 2022-06-14 DIAGNOSIS — Z01818 Encounter for other preprocedural examination: Secondary | ICD-10-CM | POA: Insufficient documentation

## 2022-06-14 HISTORY — DX: Pneumonia, unspecified organism: J18.9

## 2022-06-14 LAB — URINALYSIS, ROUTINE W REFLEX MICROSCOPIC
Bilirubin Urine: NEGATIVE
Glucose, UA: NEGATIVE mg/dL
Hgb urine dipstick: NEGATIVE
Ketones, ur: NEGATIVE mg/dL
Leukocytes,Ua: NEGATIVE
Nitrite: NEGATIVE
Protein, ur: NEGATIVE mg/dL
Specific Gravity, Urine: 1.012 (ref 1.005–1.030)
pH: 7 (ref 5.0–8.0)

## 2022-06-14 LAB — COMPREHENSIVE METABOLIC PANEL
ALT: 35 U/L (ref 0–44)
AST: 29 U/L (ref 15–41)
Albumin: 4.2 g/dL (ref 3.5–5.0)
Alkaline Phosphatase: 38 U/L (ref 38–126)
Anion gap: 12 (ref 5–15)
BUN: 22 mg/dL (ref 8–23)
CO2: 29 mmol/L (ref 22–32)
Calcium: 9.7 mg/dL (ref 8.9–10.3)
Chloride: 98 mmol/L (ref 98–111)
Creatinine, Ser: 0.79 mg/dL (ref 0.44–1.00)
GFR, Estimated: >60 mL/min (ref >60)
Glucose, Bld: 104 mg/dL — ABNORMAL HIGH (ref 70–99)
Potassium: 3.6 mmol/L (ref 3.5–5.1)
Sodium: 139 mmol/L (ref 135–145)
Total Bilirubin: 0.7 mg/dL (ref 0.3–1.2)
Total Protein: 7.4 g/dL (ref 6.5–8.1)

## 2022-06-14 LAB — SURGICAL PCR SCREEN
MRSA, PCR: NEGATIVE
Staphylococcus aureus: NEGATIVE

## 2022-06-14 LAB — CBC WITH DIFFERENTIAL/PLATELET
Abs Immature Granulocytes: 0.04 10*3/uL (ref 0.00–0.07)
Basophils Absolute: 0.1 10*3/uL (ref 0.0–0.1)
Basophils Relative: 1 %
Eosinophils Absolute: 0.1 10*3/uL (ref 0.0–0.5)
Eosinophils Relative: 2 %
HCT: 47.7 % — ABNORMAL HIGH (ref 36.0–46.0)
Hemoglobin: 15.1 g/dL — ABNORMAL HIGH (ref 12.0–15.0)
Immature Granulocytes: 1 %
Lymphocytes Relative: 18 %
Lymphs Abs: 1.4 10*3/uL (ref 0.7–4.0)
MCH: 28.5 pg (ref 26.0–34.0)
MCHC: 31.7 g/dL (ref 30.0–36.0)
MCV: 90 fL (ref 80.0–100.0)
Monocytes Absolute: 0.8 10*3/uL (ref 0.1–1.0)
Monocytes Relative: 11 %
Neutro Abs: 5.3 10*3/uL (ref 1.7–7.7)
Neutrophils Relative %: 67 %
Platelets: 297 10*3/uL (ref 150–400)
RBC: 5.3 MIL/uL — ABNORMAL HIGH (ref 3.87–5.11)
RDW: 14.8 % (ref 11.5–15.5)
WBC: 7.8 10*3/uL (ref 4.0–10.5)
nRBC: 0 % (ref 0.0–0.2)

## 2022-06-14 NOTE — Patient Instructions (Addendum)
Your procedure is scheduled on: 06/27/22 - Thursday Report to the Registration Desk on the 1st floor of [facility]. To find out your arrival time, please call [phone] between 1PM - 3PM on: 06/26/22 - Wednesday If your arrival time is 6:00 am, do not arrive before that time as the [facility] entrance doors do not open until 6:00 am.  REMEMBER: Instructions that are not followed completely may result in serious medical risk, up to and including death; or upon the discretion of your surgeon and anesthesiologist your surgery may need to be rescheduled.  Do not eat food after midnight the night before surgery.  No gum chewing or hard candies.  You may however, drink CLEAR liquids up to 2 hours before you are scheduled to arrive for your surgery. Do not drink anything within 2 hours of your scheduled arrival time.  Clear liquids include: - water  - apple juice without pulp - gatorade (not RED colors) - black coffee or tea (Do NOT add milk or creamers to the coffee or tea) Do NOT drink anything that is not on this list.  In addition, your doctor has ordered for you to drink the provided:  Ensure Pre-Surgery Clear Carbohydrate Drink  Drinking this carbohydrate drink up to two hours before surgery helps to reduce insulin resistance and improve patient outcomes. Please complete drinking 2 hours before scheduled arrival time.  One week prior to surgery: Stop Anti-inflammatories (NSAIDS) such as Advil, Aleve, Ibuprofen, Motrin, Naproxen, Naprosyn and Aspirin based products such as Excedrin, Goody's Powder, BC Powder.  Stop beginning 06/20/22, ANY OVER THE COUNTER supplements until after surgery.  You may however, continue to take Tylenol if needed for pain up until the day of surgery.   TAKE ONLY THESE MEDICATIONS THE MORNING OF SURGERY WITH A SIP OF WATER:  omeprazole (PRILOSEC)  (take one the night before and one on the morning of surgery - helps to prevent nausea after  surgery.) levothyroxine (SYNTHROID)  citalopram (CELEXA)  atenolol (TENORMIN)  WIXELA INHUB   Use inhaler albuterol (VENTOLIN HFA) on the day of surgery and bring to the hospital.  Bring your remote for the Spinal Cord Stimulator with you on the day of surgery.  No Alcohol for 24 hours before or after surgery.  No Smoking including e-cigarettes for 24 hours before surgery.  No chewable tobacco products for at least 6 hours before surgery.  No nicotine patches on the day of surgery.  Do not use any "recreational" drugs for at least a week (preferably 2 weeks) before your surgery.  Please be advised that the combination of cocaine and anesthesia may have negative outcomes, up to and including death. If you test positive for cocaine, your surgery will be cancelled.  On the morning of surgery brush your teeth with toothpaste and water, you may rinse your mouth with mouthwash if you wish. Do not swallow any toothpaste or mouthwash.  Use CHG Soap or wipes as directed on instruction sheet.  Do not wear jewelry, make-up, hairpins, clips or nail polish.  Do not wear lotions, powders, or perfumes.   Do not shave body hair from the neck down 48 hours before surgery.  Contact lenses, hearing aids and dentures may not be worn into surgery.  Do not bring valuables to the hospital. [facility] is not responsible for any missing/lost belongings or valuables.   Notify your doctor if there is any change in your medical condition (cold, fever, infection).  Wear comfortable clothing (specific  to your surgery type) to the hospital.  After surgery, you can help prevent lung complications by doing breathing exercises.  Take deep breaths and cough every 1-2 hours. Your doctor may order a device called an Incentive Spirometer to help you take deep breaths. When coughing or sneezing, hold a pillow firmly against your incision with both hands. This is called "splinting." Doing this helps protect your  incision. It also decreases belly discomfort.  If you are being admitted to the hospital overnight, leave your suitcase in the car. After surgery it may be brought to your room.  In case of increased patient census, it may be necessary for you, the patient, to continue your postoperative care in the Same Day Surgery department.  If you are being discharged the day of surgery, you will not be allowed to drive home. You will need a responsible individual to drive you home and stay with you for 24 hours after surgery.   If you are taking public transportation, you will need to have a responsible individual with you.  Please call the Pre-admissions Testing Dept. at [phone] if you have any questions about these instructions.  Surgery Visitation Policy:  Patients having surgery or a procedure may have two visitors.  Children under the age of 77 must have an adult with them who is not the patient.  Inpatient Visitation:    Visiting hours are 7 a.m. to 8 p.m. Up to four visitors are allowed at one time in a patient room. The visitors may rotate out with other people during the day.  One visitor age 57 or older may stay with the patient overnight and must be in the room by 8 p.m.   Pre-operative 5 CHG Bath Instructions   You can play a key role in reducing the risk of infection after surgery. Your skin needs to be as free of germs as possible. You can reduce the number of germs on your skin by washing with CHG (chlorhexidine gluconate) soap before surgery. CHG is an antiseptic soap that kills germs and continues to kill germs even after washing.   DO NOT use if you have an allergy to chlorhexidine/CHG or antibacterial soaps. If your skin becomes reddened or irritated, stop using the CHG and notify one of our RNs at [phone].   Please shower with the CHG soap starting 4 days before surgery using the following schedule:     Please keep in mind the following:  DO NOT shave,  including legs and underarms, starting the day of your first shower.   You may shave your face at any point before/day of surgery.  Place clean sheets on your bed the day you start using CHG soap. Use a clean washcloth (not used since being washed) for each shower. DO NOT sleep with pets once you start using the CHG.   CHG Shower Instructions:  If you choose to wash your hair and private area, wash first with your normal shampoo/soap.  After you use shampoo/soap, rinse your hair and body thoroughly to remove shampoo/soap residue.  Turn the water OFF and apply about 3 tablespoons (45 ml) of CHG soap to a CLEAN washcloth.  Apply CHG soap ONLY FROM YOUR NECK DOWN TO YOUR TOES (washing for 3-5 minutes)  DO NOT use CHG soap on face, private areas, open wounds, or sores.  Pay special attention to the area where your surgery is being performed.  If you are having back surgery, having someone wash your back for  you may be helpful. Wait 2 minutes after CHG soap is applied, then you may rinse off the CHG soap.  Pat dry with a clean towel  Put on clean clothes/pajamas   If you choose to wear lotion, please use ONLY the CHG-compatible lotions on the back of this paper.     Additional instructions for the day of surgery: DO NOT APPLY any lotions, deodorants, cologne, or perfumes.   Put on clean/comfortable clothes.  Brush your teeth.  Ask your nurse before applying any prescription medications to the skin.      CHG Compatible Lotions   Aveeno Moisturizing lotion  Cetaphil Moisturizing Cream  Cetaphil Moisturizing Lotion  Clairol Herbal Essence Moisturizing Lotion, Dry Skin  Clairol Herbal Essence Moisturizing Lotion, Extra Dry Skin  Clairol Herbal Essence Moisturizing Lotion, Normal Skin  Curel Age Defying Therapeutic Moisturizing Lotion with Alpha Hydroxy  Curel Extreme Care Body Lotion  Curel Soothing Hands Moisturizing Hand Lotion  Curel Therapeutic Moisturizing Cream, Fragrance-Free   Curel Therapeutic Moisturizing Lotion, Fragrance-Free  Curel Therapeutic Moisturizing Lotion, Original Formula  Eucerin Daily Replenishing Lotion  Eucerin Dry Skin Therapy Plus Alpha Hydroxy Crme  Eucerin Dry Skin Therapy Plus Alpha Hydroxy Lotion  Eucerin Original Crme  Eucerin Original Lotion  Eucerin Plus Crme Eucerin Plus Lotion  Eucerin TriLipid Replenishing Lotion  Keri Anti-Bacterial Hand Lotion  Keri Deep Conditioning Original Lotion Dry Skin Formula Softly Scented  Keri Deep Conditioning Original Lotion, Fragrance Free Sensitive Skin Formula  Keri Lotion Fast Absorbing Fragrance Free Sensitive Skin Formula  Keri Lotion Fast Absorbing Softly Scented Dry Skin Formula  Keri Original Lotion  Keri Skin Renewal Lotion Keri Silky Smooth Lotion  Keri Silky Smooth Sensitive Skin Lotion  Nivea Body Creamy Conditioning Oil  Nivea Body Extra Enriched Lotion  Nivea Body Original Lotion  Nivea Body Sheer Moisturizing Lotion Nivea Crme  Nivea Skin Firming Lotion  NutraDerm 30 Skin Lotion  NutraDerm Skin Lotion  NutraDerm Therapeutic Skin Cream  NutraDerm Therapeutic Skin Lotion  ProShield Protective Hand Cream  Provon moisturizing lotion  How to Use an Incentive Spirometer  An incentive spirometer is a tool that measures how well you are filling your lungs with each breath. Learning to take long, deep breaths using this tool can help you keep your lungs clear and active. This may help to reverse or lessen your chance of developing breathing (pulmonary) problems, especially infection. You may be asked to use a spirometer: After a surgery. If you have a lung problem or a history of smoking. After a long period of time when you have been unable to move or be active. If the spirometer includes an indicator to show the highest number that you have reached, your health care provider or respiratory therapist will help you set a goal. Keep a log of your progress as told by your health care  provider. What are the risks? Breathing too quickly may cause dizziness or cause you to pass out. Take your time so you do not get dizzy or light-headed. If you are in pain, you may need to take pain medicine before doing incentive spirometry. It is harder to take a deep breath if you are having pain. How to use your incentive spirometer  Sit up on the edge of your bed or on a chair. Hold the incentive spirometer so that it is in an upright position. Before you use the spirometer, breathe out normally. Place the mouthpiece in your mouth. Make sure your lips are closed tightly around  it. Breathe in slowly and as deeply as you can through your mouth, causing the piston or the ball to rise toward the top of the chamber. Hold your breath for 3-5 seconds, or for as long as possible. If the spirometer includes a coach indicator, use this to guide you in breathing. Slow down your breathing if the indicator goes above the marked areas. Remove the mouthpiece from your mouth and breathe out normally. The piston or ball will return to the bottom of the chamber. Rest for a few seconds, then repeat the steps 10 or more times. Take your time and take a few normal breaths between deep breaths so that you do not get dizzy or light-headed. Do this every 1-2 hours when you are awake. If the spirometer includes a goal marker to show the highest number you have reached (best effort), use this as a goal to work toward during each repetition. After each set of 10 deep breaths, cough a few times. This will help to make sure that your lungs are clear. If you have an incision on your chest or abdomen from surgery, place a pillow or a rolled-up towel firmly against the incision when you cough. This can help to reduce pain while taking deep breaths and coughing. General tips When you are able to get out of bed: Walk around often. Continue to take deep breaths and cough in order to clear your lungs. Keep using the  incentive spirometer until your health care provider says it is okay to stop using it. If you have been in the hospital, you may be told to keep using the spirometer at home. Contact a health care provider if: You are having difficulty using the spirometer. You have trouble using the spirometer as often as instructed. Your pain medicine is not giving enough relief for you to use the spirometer as told. You have a fever. Get help right away if: You develop shortness of breath. You develop a cough with bloody mucus from the lungs. You have fluid or blood coming from an incision site after you cough. Summary An incentive spirometer is a tool that can help you learn to take long, deep breaths to keep your lungs clear and active. You may be asked to use a spirometer after a surgery, if you have a lung problem or a history of smoking, or if you have been inactive for a long period of time. Use your incentive spirometer as instructed every 1-2 hours while you are awake. If you have an incision on your chest or abdomen, place a pillow or a rolled-up towel firmly against your incision when you cough. This will help to reduce pain. Get help right away if you have shortness of breath, you cough up bloody mucus, or blood comes from your incision when you cough. This information is not intended to replace advice given to you by your health care provider. Make sure you discuss any questions you have with your health care provider. Document Revised: 04/05/2019 Document Reviewed: 04/05/2019 Elsevier Patient Education  2023 [employer].

## 2022-06-27 ENCOUNTER — Ambulatory Visit: Payer: Medicare PPO | Admitting: Urgent Care

## 2022-06-27 ENCOUNTER — Encounter
Admission: RE | Disposition: A | Discharge status: Home Health | Payer: Self-pay | Source: Home / Self Care | Attending: Orthopedic Surgery

## 2022-06-27 ENCOUNTER — Observation Stay
Admission: RE | Admit: 2022-06-27 | Discharge: 2022-06-28 | Disposition: A | Discharge status: Home Health | Payer: Medicare PPO | Attending: Orthopedic Surgery | Admitting: Orthopedic Surgery

## 2022-06-27 ENCOUNTER — Encounter: Payer: Self-pay | Admitting: Orthopedic Surgery

## 2022-06-27 ENCOUNTER — Other Ambulatory Visit: Payer: Self-pay

## 2022-06-27 ENCOUNTER — Ambulatory Visit: Payer: Medicare PPO

## 2022-06-27 ENCOUNTER — Ambulatory Visit: Payer: Medicare PPO | Admitting: Anesthesiology

## 2022-06-27 DIAGNOSIS — J45909 Unspecified asthma, uncomplicated: Secondary | ICD-10-CM | POA: Insufficient documentation

## 2022-06-27 DIAGNOSIS — I10 Essential (primary) hypertension: Secondary | ICD-10-CM | POA: Diagnosis not present

## 2022-06-27 DIAGNOSIS — Z96652 Presence of left artificial knee joint: Secondary | ICD-10-CM

## 2022-06-27 DIAGNOSIS — Z7982 Long term (current) use of aspirin: Secondary | ICD-10-CM | POA: Diagnosis not present

## 2022-06-27 DIAGNOSIS — M1712 Unilateral primary osteoarthritis, left knee: Principal | ICD-10-CM | POA: Insufficient documentation

## 2022-06-27 DIAGNOSIS — Z85828 Personal history of other malignant neoplasm of skin: Secondary | ICD-10-CM | POA: Insufficient documentation

## 2022-06-27 DIAGNOSIS — J449 Chronic obstructive pulmonary disease, unspecified: Secondary | ICD-10-CM | POA: Insufficient documentation

## 2022-06-27 DIAGNOSIS — Z8616 Personal history of COVID-19: Secondary | ICD-10-CM | POA: Insufficient documentation

## 2022-06-27 DIAGNOSIS — Z01812 Encounter for preprocedural laboratory examination: Secondary | ICD-10-CM

## 2022-06-27 DIAGNOSIS — Z79899 Other long term (current) drug therapy: Secondary | ICD-10-CM | POA: Insufficient documentation

## 2022-06-27 DIAGNOSIS — Z87891 Personal history of nicotine dependence: Secondary | ICD-10-CM | POA: Diagnosis not present

## 2022-06-27 DIAGNOSIS — Z471 Aftercare following joint replacement surgery: Secondary | ICD-10-CM | POA: Diagnosis not present

## 2022-06-27 DIAGNOSIS — E039 Hypothyroidism, unspecified: Secondary | ICD-10-CM | POA: Diagnosis not present

## 2022-06-27 HISTORY — PX: TOTAL KNEE ARTHROPLASTY: SHX125

## 2022-06-27 LAB — TYPE AND SCREEN
ABO/RH(D): A POS
Antibody Screen: NEGATIVE

## 2022-06-27 LAB — ABO/RH: ABO/RH(D): A POS

## 2022-06-27 SURGERY — ARTHROPLASTY, KNEE, TOTAL
Anesthesia: General | Site: Knee | Laterality: Left

## 2022-06-27 MED ORDER — FENTANYL CITRATE (PF) 100 MCG/2ML IJ SOLN
INTRAMUSCULAR | Status: DC | PRN
  Administered 2022-06-27 (×2): 50 ug via INTRAVENOUS

## 2022-06-27 MED ORDER — ASPIRIN 81 MG PO CHEW
CHEWABLE_TABLET | ORAL | Status: AC
  Filled 2022-06-27: qty 1

## 2022-06-27 MED ORDER — ACETAMINOPHEN 10 MG/ML IV SOLN
INTRAVENOUS | Status: DC | PRN
  Administered 2022-06-27: 1000 mg via INTRAVENOUS

## 2022-06-27 MED ORDER — CEFAZOLIN SODIUM-DEXTROSE 2-4 GM/100ML-% IV SOLN
2.0000 g | INTRAVENOUS | Status: AC
  Administered 2022-06-27: 2 g via INTRAVENOUS

## 2022-06-27 MED ORDER — GLYCOPYRROLATE 0.2 MG/ML IJ SOLN
INTRAMUSCULAR | Status: AC
  Filled 2022-06-27: qty 1

## 2022-06-27 MED ORDER — ASPIRIN 81 MG PO CHEW
81.0000 mg | CHEWABLE_TABLET | Freq: Every day | ORAL | Status: DC
  Administered 2022-06-27 – 2022-06-28 (×2): 81 mg via ORAL

## 2022-06-27 MED ORDER — CHLORHEXIDINE GLUCONATE 0.12 % MT SOLN
OROMUCOSAL | Status: AC
  Filled 2022-06-27: qty 15

## 2022-06-27 MED ORDER — CHLORHEXIDINE GLUCONATE 0.12 % MT SOLN
15.0000 mL | Freq: Once | OROMUCOSAL | Status: AC
  Administered 2022-06-27: 15 mL via OROMUCOSAL

## 2022-06-27 MED ORDER — MIDAZOLAM HCL 2 MG/2ML IJ SOLN
INTRAMUSCULAR | Status: AC
  Filled 2022-06-27: qty 2

## 2022-06-27 MED ORDER — FENOFIBRATE 160 MG PO TABS
160.0000 mg | ORAL_TABLET | Freq: Every day | ORAL | Status: DC
  Administered 2022-06-27: 160 mg via ORAL
  Filled 2022-06-27: qty 1

## 2022-06-27 MED ORDER — DEXAMETHASONE SODIUM PHOSPHATE 10 MG/ML IJ SOLN: 8.0000 mg | Freq: Once | INTRAMUSCULAR | Status: DC

## 2022-06-27 MED ORDER — KETOROLAC TROMETHAMINE 30 MG/ML IJ SOLN
INTRAMUSCULAR | Status: AC
  Filled 2022-06-27: qty 1

## 2022-06-27 MED ORDER — OXYCODONE HCL 5 MG PO TABS
ORAL_TABLET | ORAL | Status: AC
  Filled 2022-06-27: qty 1

## 2022-06-27 MED ORDER — ROCURONIUM BROMIDE 10 MG/ML (PF) SYRINGE
PREFILLED_SYRINGE | INTRAVENOUS | Status: AC
  Filled 2022-06-27: qty 10

## 2022-06-27 MED ORDER — MAGNESIUM OXIDE -MG SUPPLEMENT 400 (240 MG) MG PO TABS
800.0000 mg | ORAL_TABLET | Freq: Every day | ORAL | Status: DC
  Administered 2022-06-28: 800 mg via ORAL
  Filled 2022-06-27 (×2): qty 2

## 2022-06-27 MED ORDER — PROPOFOL 500 MG/50ML IV EMUL
INTRAVENOUS | Status: DC | PRN
  Administered 2022-06-27: 50 ug/kg/min via INTRAVENOUS

## 2022-06-27 MED ORDER — ORAL CARE MOUTH RINSE: 15.0000 mL | Freq: Once | OROMUCOSAL | Status: AC

## 2022-06-27 MED ORDER — ALBUTEROL SULFATE HFA 108 (90 BASE) MCG/ACT IN AERS
INHALATION_SPRAY | RESPIRATORY_TRACT | Status: AC
  Filled 2022-06-27: qty 6.7

## 2022-06-27 MED ORDER — TRANEXAMIC ACID 1000 MG/10ML IV SOLN
INTRAVENOUS | Status: AC
  Filled 2022-06-27: qty 10

## 2022-06-27 MED ORDER — OXYCODONE HCL 5 MG/5ML PO SOLN: 5.0000 mg | Freq: Once | ORAL | Status: AC | PRN

## 2022-06-27 MED ORDER — ALPRAZOLAM 0.5 MG PO TABS
0.5000 mg | ORAL_TABLET | Freq: Two times a day (BID) | ORAL | Status: DC | PRN
  Administered 2022-06-28: 1 mg via ORAL

## 2022-06-27 MED ORDER — MIDAZOLAM HCL 2 MG/2ML IJ SOLN
INTRAMUSCULAR | Status: DC | PRN
  Administered 2022-06-27: 2 mg via INTRAVENOUS

## 2022-06-27 MED ORDER — FENTANYL CITRATE (PF) 100 MCG/2ML IJ SOLN
INTRAMUSCULAR | Status: AC
  Filled 2022-06-27: qty 2

## 2022-06-27 MED ORDER — KETOROLAC TROMETHAMINE 15 MG/ML IJ SOLN
7.5000 mg | Freq: Four times a day (QID) | INTRAMUSCULAR | Status: AC
  Administered 2022-06-27 – 2022-06-28 (×3): 7.5 mg via INTRAVENOUS

## 2022-06-27 MED ORDER — ATENOLOL 50 MG PO TABS
50.0000 mg | ORAL_TABLET | ORAL | Status: DC
  Administered 2022-06-28: 50 mg via ORAL

## 2022-06-27 MED ORDER — SODIUM CHLORIDE 0.9 % IV SOLN: INTRAVENOUS | Status: DC

## 2022-06-27 MED ORDER — LIDOCAINE HCL (CARDIAC) PF 100 MG/5ML IV SOSY
PREFILLED_SYRINGE | INTRAVENOUS | Status: DC | PRN
  Administered 2022-06-27: 60 mg via INTRAVENOUS

## 2022-06-27 MED ORDER — DOCUSATE SODIUM 100 MG PO CAPS
100.0000 mg | ORAL_CAPSULE | Freq: Two times a day (BID) | ORAL | Status: DC
  Administered 2022-06-27 – 2022-06-28 (×2): 100 mg via ORAL

## 2022-06-27 MED ORDER — ACETAMINOPHEN 10 MG/ML IV SOLN
INTRAVENOUS | Status: AC
  Filled 2022-06-27: qty 100

## 2022-06-27 MED ORDER — SODIUM CHLORIDE FLUSH 0.9 % IV SOLN
INTRAVENOUS | Status: AC
  Filled 2022-06-27: qty 40

## 2022-06-27 MED ORDER — PANTOPRAZOLE SODIUM 40 MG PO TBEC
40.0000 mg | DELAYED_RELEASE_TABLET | Freq: Every day | ORAL | Status: DC
  Administered 2022-06-28: 40 mg via ORAL

## 2022-06-27 MED ORDER — SURGIPHOR WOUND IRRIGATION SYSTEM - OPTIME: TOPICAL | Status: DC | PRN

## 2022-06-27 MED ORDER — PROPOFOL 10 MG/ML IV BOLUS
INTRAVENOUS | Status: AC
  Filled 2022-06-27: qty 20

## 2022-06-27 MED ORDER — BUPIVACAINE HCL (PF) 0.25 % IJ SOLN
INTRAMUSCULAR | Status: AC
  Filled 2022-06-27: qty 60

## 2022-06-27 MED ORDER — OXYCODONE HCL 5 MG PO TABS
5.0000 mg | ORAL_TABLET | ORAL | Status: DC | PRN
  Administered 2022-06-27 – 2022-06-28 (×5): 5 mg via ORAL

## 2022-06-27 MED ORDER — MENTHOL 3 MG MT LOZG: 1.0000 | LOZENGE | OROMUCOSAL | Status: DC | PRN

## 2022-06-27 MED ORDER — CEFAZOLIN SODIUM-DEXTROSE 2-4 GM/100ML-% IV SOLN
INTRAVENOUS | Status: AC
  Filled 2022-06-27: qty 100

## 2022-06-27 MED ORDER — CEFAZOLIN SODIUM-DEXTROSE 2-4 GM/100ML-% IV SOLN
2.0000 g | Freq: Three times a day (TID) | INTRAVENOUS | Status: AC
  Administered 2022-06-27 (×2): 2 g via INTRAVENOUS

## 2022-06-27 MED ORDER — DEXAMETHASONE SODIUM PHOSPHATE 10 MG/ML IJ SOLN
INTRAMUSCULAR | Status: DC | PRN
  Administered 2022-06-27: 8 mg via INTRAVENOUS

## 2022-06-27 MED ORDER — ROPINIROLE HCL 1 MG PO TABS
1.0000 mg | ORAL_TABLET | Freq: Every day | ORAL | Status: DC
  Administered 2022-06-27: 1 mg via ORAL
  Filled 2022-06-27: qty 1

## 2022-06-27 MED ORDER — ONDANSETRON HCL 4 MG PO TABS: 4.0000 mg | ORAL_TABLET | Freq: Four times a day (QID) | ORAL | Status: DC | PRN

## 2022-06-27 MED ORDER — SODIUM CHLORIDE (PF) 0.9 % IJ SOLN
INTRAMUSCULAR | Status: DC | PRN
  Administered 2022-06-27: 71 mL via INTRAMUSCULAR

## 2022-06-27 MED ORDER — LACTATED RINGERS IV SOLN: INTRAVENOUS | Status: DC

## 2022-06-27 MED ORDER — ONDANSETRON HCL 4 MG/2ML IJ SOLN: 4.0000 mg | Freq: Four times a day (QID) | INTRAMUSCULAR | Status: DC | PRN

## 2022-06-27 MED ORDER — PROPOFOL 1000 MG/100ML IV EMUL
INTRAVENOUS | Status: AC
  Filled 2022-06-27: qty 100

## 2022-06-27 MED ORDER — SUGAMMADEX SODIUM 200 MG/2ML IV SOLN
INTRAVENOUS | Status: DC | PRN
  Administered 2022-06-27: 200 mg via INTRAVENOUS

## 2022-06-27 MED ORDER — ACETAMINOPHEN 500 MG PO TABS
ORAL_TABLET | ORAL | Status: AC
  Filled 2022-06-27: qty 2

## 2022-06-27 MED ORDER — MECLIZINE HCL 25 MG PO TABS: 25.0000 mg | ORAL_TABLET | Freq: Three times a day (TID) | ORAL | Status: DC | PRN

## 2022-06-27 MED ORDER — ZOLPIDEM TARTRATE 5 MG PO TABS
5.0000 mg | ORAL_TABLET | Freq: Every evening | ORAL | Status: DC | PRN
  Administered 2022-06-27: 5 mg via ORAL

## 2022-06-27 MED ORDER — LIDOCAINE HCL (PF) 2 % IJ SOLN
INTRAMUSCULAR | Status: AC
  Filled 2022-06-27: qty 5

## 2022-06-27 MED ORDER — KETOROLAC TROMETHAMINE 15 MG/ML IJ SOLN
INTRAMUSCULAR | Status: AC
  Filled 2022-06-27: qty 1

## 2022-06-27 MED ORDER — SODIUM CHLORIDE 0.9 % IR SOLN
Status: DC | PRN
  Administered 2022-06-27: 3000 mL

## 2022-06-27 MED ORDER — MONTELUKAST SODIUM 10 MG PO TABS
10.0000 mg | ORAL_TABLET | Freq: Every day | ORAL | Status: DC
  Administered 2022-06-27: 10 mg via ORAL
  Filled 2022-06-27: qty 1

## 2022-06-27 MED ORDER — TRAMADOL HCL 50 MG PO TABS
50.0000 mg | ORAL_TABLET | Freq: Four times a day (QID) | ORAL | Status: DC | PRN
  Administered 2022-06-28: 50 mg via ORAL

## 2022-06-27 MED ORDER — ONDANSETRON HCL 4 MG/2ML IJ SOLN
INTRAMUSCULAR | Status: DC | PRN
  Administered 2022-06-27: 4 mg via INTRAVENOUS

## 2022-06-27 MED ORDER — PHENOL 1.4 % MT LIQD: 1.0000 | OROMUCOSAL | Status: DC | PRN

## 2022-06-27 MED ORDER — DOCUSATE SODIUM 100 MG PO CAPS
ORAL_CAPSULE | ORAL | Status: AC
  Filled 2022-06-27: qty 1

## 2022-06-27 MED ORDER — OXYCODONE HCL 5 MG PO TABS: 2.5000 mg | ORAL_TABLET | ORAL | Status: DC | PRN

## 2022-06-27 MED ORDER — EPINEPHRINE PF 1 MG/ML IJ SOLN
INTRAMUSCULAR | Status: AC
  Filled 2022-06-27: qty 2

## 2022-06-27 MED ORDER — MELATONIN 5 MG PO TABS: 5.0000 mg | ORAL_TABLET | Freq: Every evening | ORAL | Status: DC | PRN

## 2022-06-27 MED ORDER — ENOXAPARIN SODIUM 30 MG/0.3ML IJ SOSY
30.0000 mg | PREFILLED_SYRINGE | Freq: Two times a day (BID) | INTRAMUSCULAR | Status: DC
  Administered 2022-06-28: 30 mg via SUBCUTANEOUS

## 2022-06-27 MED ORDER — GLYCOPYRROLATE 0.2 MG/ML IJ SOLN
INTRAMUSCULAR | Status: DC | PRN
  Administered 2022-06-27 (×2): .1 mg via INTRAVENOUS

## 2022-06-27 MED ORDER — KETAMINE HCL 50 MG/5ML IJ SOSY
PREFILLED_SYRINGE | INTRAMUSCULAR | Status: AC
  Filled 2022-06-27: qty 5

## 2022-06-27 MED ORDER — METOCLOPRAMIDE HCL 5 MG/ML IJ SOLN: 5.0000 mg | Freq: Three times a day (TID) | INTRAMUSCULAR | Status: DC | PRN

## 2022-06-27 MED ORDER — MOMETASONE FURO-FORMOTEROL FUM 200-5 MCG/ACT IN AERO
2.0000 | INHALATION_SPRAY | Freq: Two times a day (BID) | RESPIRATORY_TRACT | Status: DC
  Administered 2022-06-28: 2 via RESPIRATORY_TRACT
  Filled 2022-06-27: qty 8.8

## 2022-06-27 MED ORDER — FENTANYL CITRATE (PF) 100 MCG/2ML IJ SOLN
25.0000 ug | INTRAMUSCULAR | Status: DC | PRN
  Administered 2022-06-27 (×4): 25 ug via INTRAVENOUS

## 2022-06-27 MED ORDER — BUPIVACAINE LIPOSOME 1.3 % IJ SUSP
INTRAMUSCULAR | Status: AC
  Filled 2022-06-27: qty 40

## 2022-06-27 MED ORDER — EZETIMIBE 10 MG PO TABS
10.0000 mg | ORAL_TABLET | Freq: Every day | ORAL | Status: DC
  Administered 2022-06-27: 10 mg via ORAL

## 2022-06-27 MED ORDER — KETAMINE HCL 10 MG/ML IJ SOLN
INTRAMUSCULAR | Status: DC | PRN
  Administered 2022-06-27: 30 mg via INTRAVENOUS

## 2022-06-27 MED ORDER — TRANEXAMIC ACID-NACL 1000-0.7 MG/100ML-% IV SOLN
1000.0000 mg | INTRAVENOUS | Status: AC
  Administered 2022-06-27 (×2): 1000 mg via INTRAVENOUS

## 2022-06-27 MED ORDER — ZOLPIDEM TARTRATE 5 MG PO TABS
ORAL_TABLET | ORAL | Status: AC
  Filled 2022-06-27: qty 1

## 2022-06-27 MED ORDER — OXYCODONE HCL 5 MG PO TABS
5.0000 mg | ORAL_TABLET | Freq: Once | ORAL | Status: AC | PRN
  Administered 2022-06-27: 5 mg via ORAL

## 2022-06-27 MED ORDER — ALBUTEROL SULFATE HFA 108 (90 BASE) MCG/ACT IN AERS
INHALATION_SPRAY | RESPIRATORY_TRACT | Status: DC | PRN
  Administered 2022-06-27: 2 via RESPIRATORY_TRACT

## 2022-06-27 MED ORDER — TRANEXAMIC ACID 1000 MG/10ML IV SOLN
INTRAVENOUS | Status: AC
  Filled 2022-06-27: qty 20

## 2022-06-27 MED ORDER — CITALOPRAM HYDROBROMIDE 20 MG PO TABS
20.0000 mg | ORAL_TABLET | ORAL | Status: DC
  Administered 2022-06-28: 20 mg via ORAL
  Filled 2022-06-27: qty 1

## 2022-06-27 MED ORDER — ACETAMINOPHEN 500 MG PO TABS
1000.0000 mg | ORAL_TABLET | Freq: Three times a day (TID) | ORAL | Status: DC
  Administered 2022-06-27 – 2022-06-28 (×3): 1000 mg via ORAL

## 2022-06-27 MED ORDER — PROPOFOL 10 MG/ML IV BOLUS
INTRAVENOUS | Status: DC | PRN
  Administered 2022-06-27: 140 mg via INTRAVENOUS
  Administered 2022-06-27: 40 mg via INTRAVENOUS

## 2022-06-27 MED ORDER — ROCURONIUM BROMIDE 100 MG/10ML IV SOLN
INTRAVENOUS | Status: DC | PRN
  Administered 2022-06-27: 40 mg via INTRAVENOUS
  Administered 2022-06-27: 10 mg via INTRAVENOUS

## 2022-06-27 MED ORDER — LEVOTHYROXINE SODIUM 25 MCG PO TABS
25.0000 ug | ORAL_TABLET | Freq: Every day | ORAL | Status: DC
  Filled 2022-06-27 (×2): qty 1

## 2022-06-27 MED ORDER — EZETIMIBE 10 MG PO TABS
ORAL_TABLET | ORAL | Status: AC
  Filled 2022-06-27: qty 1

## 2022-06-27 MED ORDER — METOCLOPRAMIDE HCL 10 MG PO TABS: 5.0000 mg | ORAL_TABLET | Freq: Three times a day (TID) | ORAL | Status: DC | PRN

## 2022-06-27 MED ORDER — ONDANSETRON HCL 4 MG/2ML IJ SOLN
INTRAMUSCULAR | Status: AC
  Filled 2022-06-27: qty 2

## 2022-06-27 MED ORDER — DEXAMETHASONE SODIUM PHOSPHATE 10 MG/ML IJ SOLN
INTRAMUSCULAR | Status: AC
  Filled 2022-06-27: qty 1

## 2022-06-27 MED ORDER — HYDROCHLOROTHIAZIDE 25 MG PO TABS
12.5000 mg | ORAL_TABLET | ORAL | Status: DC
  Administered 2022-06-28: 12.5 mg via ORAL

## 2022-06-27 SURGICAL SUPPLY — 77 items
ADH SKN CLS APL DERMABOND .7 (GAUZE/BANDAGES/DRESSINGS) ×1
APL PRP STRL LF DISP 70% ISPRP (MISCELLANEOUS) ×2
BLADE PATELLA REAM PILOT HOLE (MISCELLANEOUS) IMPLANT
BLADE SAW SAG 25X90X1.19 (BLADE) ×1 IMPLANT
BLADE SAW SAG 29X58X.64 (BLADE) ×1 IMPLANT
BOWL CEMENT MIX W/ADAPTER (MISCELLANEOUS) ×1 IMPLANT
BSPLAT TIB 5D D CMNT STM LT (Knees) ×1 IMPLANT
CEMENT BONE R 1X40 (Cement) ×2 IMPLANT
CHLORAPREP W/TINT 26 (MISCELLANEOUS) ×2 IMPLANT
COMP MED POLY AS PERS S6-7 12 (Joint) ×1 IMPLANT
COMPONENT MED PLY PERSS6-7 12 (Joint) IMPLANT
COOLER POLAR GLACIER W/PUMP (MISCELLANEOUS) ×1 IMPLANT
CUFF TOURN SGL QUICK 24 (TOURNIQUET CUFF)
CUFF TOURN SGL QUICK 34 (TOURNIQUET CUFF)
CUFF TRNQT CYL 24X4X16.5-23 (TOURNIQUET CUFF) IMPLANT
CUFF TRNQT CYL 34X4.125X (TOURNIQUET CUFF) IMPLANT
DERMABOND ADVANCED .7 DNX12 (GAUZE/BANDAGES/DRESSINGS) ×1 IMPLANT
DRAPE 3/4 80X56 (DRAPES) ×1 IMPLANT
DRAPE INCISE IOBAN 66X60 STRL (DRAPES) IMPLANT
DRSG MEPILEX SACRM 8.7X9.8 (GAUZE/BANDAGES/DRESSINGS) ×1 IMPLANT
DRSG OPSITE POSTOP 4X10 (GAUZE/BANDAGES/DRESSINGS) IMPLANT
DRSG OPSITE POSTOP 4X8 (GAUZE/BANDAGES/DRESSINGS) IMPLANT
ELECT REM PT RETURN 9FT ADLT (ELECTROSURGICAL) ×1
ELECTRODE REM PT RTRN 9FT ADLT (ELECTROSURGICAL) ×1 IMPLANT
GLOVE BIO SURGEON STRL SZ8 (GLOVE) ×1 IMPLANT
GLOVE BIOGEL PI IND STRL 8 (GLOVE) ×1 IMPLANT
GLOVE PI ORTHO PRO STRL 7.5 (GLOVE) ×2 IMPLANT
GLOVE PI ORTHO PRO STRL SZ8 (GLOVE) ×2 IMPLANT
GOWN SRG XL LVL 3 NONREINFORCE (GOWNS) ×1 IMPLANT
GOWN STRL NON-REIN TWL XL LVL3 (GOWNS) ×1
GOWN STRL REUS W/ TWL LRG LVL3 (GOWN DISPOSABLE) ×1 IMPLANT
GOWN STRL REUS W/ TWL XL LVL3 (GOWN DISPOSABLE) ×1 IMPLANT
GOWN STRL REUS W/TWL LRG LVL3 (GOWN DISPOSABLE) ×1
GOWN STRL REUS W/TWL XL LVL3 (GOWN DISPOSABLE) ×1
HANDLE YANKAUER SUCT OPEN TIP (MISCELLANEOUS) ×1 IMPLANT
HOOD PEEL AWAY T7 (MISCELLANEOUS) ×2 IMPLANT
IV NS IRRIG 3000ML ARTHROMATIC (IV SOLUTION) ×1 IMPLANT
KIT TURNOVER KIT A (KITS) ×1 IMPLANT
KNEE SYSTEM FEMUR SZ 6 LT (Knees) IMPLANT
MANIFOLD NEPTUNE II (INSTRUMENTS) ×1 IMPLANT
MARKER SKIN DUAL TIP RULER LAB (MISCELLANEOUS) ×1 IMPLANT
MAT ABSORB  FLUID 56X50 GRAY (MISCELLANEOUS) ×1
MAT ABSORB FLUID 56X50 GRAY (MISCELLANEOUS) ×1 IMPLANT
NDL FILTER BLUNT 18X1 1/2 (NEEDLE) ×1 IMPLANT
NDL HYPO 21X1.5 SAFETY (NEEDLE) ×1 IMPLANT
NDL SAFETY ECLIP 18X1.5 (MISCELLANEOUS) ×1 IMPLANT
NEEDLE FILTER BLUNT 18X1 1/2 (NEEDLE) ×1 IMPLANT
NEEDLE HYPO 21X1.5 SAFETY (NEEDLE) ×1 IMPLANT
PACK TOTAL KNEE (MISCELLANEOUS) ×1 IMPLANT
PAD ARMBOARD 7.5X6 YLW CONV (MISCELLANEOUS) ×3 IMPLANT
PAD WRAPON POLAR KNEE (MISCELLANEOUS) ×1 IMPLANT
PENCIL SMOKE EVACUATOR (MISCELLANEOUS) ×1 IMPLANT
PIN DRILL HDLS TROCAR 75 4PK (PIN) IMPLANT
PULSAVAC PLUS IRRIG FAN TIP (DISPOSABLE) ×1
SCREW FEMALE HEX FIX 25X2.5 (ORTHOPEDIC DISPOSABLE SUPPLIES) IMPLANT
SCREW HEX HEADED 3.5X27 DISP (ORTHOPEDIC DISPOSABLE SUPPLIES) IMPLANT
SLEEVE SCD COMPRESS KNEE MED (STOCKING) ×1 IMPLANT
SOLUTION IRRIG SURGIPHOR (IV SOLUTION) ×1 IMPLANT
STEM POLY PAT PLY 32M KNEE (Knees) IMPLANT
STEM TIB ST PERS 14+30 (Stem) IMPLANT
STEM TIBIA 5 DEG SZ D L KNEE (Knees) IMPLANT
SUT DVC 2 QUILL PDO  T11 36X36 (SUTURE) ×1
SUT DVC 2 QUILL PDO T11 36X36 (SUTURE) ×1 IMPLANT
SUT QUILL MONODERM 3-0 PS-2 (SUTURE) ×1 IMPLANT
SUT VIC AB 0 CT1 36 (SUTURE) ×1 IMPLANT
SUT VIC AB 2-0 CT2 27 (SUTURE) ×2 IMPLANT
SUT VICRYL 1-0 27IN ABS (SUTURE) ×1
SUTURE VICRYL 1-0 27IN ABS (SUTURE) ×1 IMPLANT
SYR 30ML LL (SYRINGE) ×2 IMPLANT
SYR TB 1ML LL NO SAFETY (SYRINGE) ×1 IMPLANT
TAPE CLOTH 3X10 WHT NS LF (GAUZE/BANDAGES/DRESSINGS) ×1 IMPLANT
TIBIA STEM 5 DEG SZ D L KNEE (Knees) ×1 IMPLANT
TIP FAN IRRIG PULSAVAC PLUS (DISPOSABLE) ×1 IMPLANT
TOWEL OR 17X26 4PK STRL BLUE (TOWEL DISPOSABLE) IMPLANT
TRAP FLUID SMOKE EVACUATOR (MISCELLANEOUS) ×1 IMPLANT
WATER STERILE IRR 1000ML POUR (IV SOLUTION) ×1 IMPLANT
WRAPON POLAR PAD KNEE (MISCELLANEOUS) ×1

## 2022-06-27 NOTE — Interval H&P Note (Signed)
Patient history and physical updated. Consent reviewed including risks, benefits, and alternatives to surgery. We had a specific conversation about the risk of peroneal injury during correction of her deformity. Patient agrees with above plan to proceed with left total knee replacement.
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
## 2022-06-27 NOTE — Anesthesia Procedure Notes (Signed)
Procedure Name: Intubation Date/Time: 06/27/2022 7:32 AM  Performed by: Morene Crocker, CRNAPre-anesthesia Checklist: Patient identified, Patient being monitored, Timeout performed, Emergency Drugs available and Suction available Patient Re-evaluated:Patient Re-evaluated prior to induction Oxygen Delivery Method: Circle system utilized Preoxygenation: Pre-oxygenation with 100% oxygen Induction Type: IV induction Ventilation: Mask ventilation without difficulty Laryngoscope Size: 3 and McGraph Grade View: Grade I Tube type: Oral Tube size: 7.0 mm Number of attempts: 1 Airway Equipment and Method: Stylet Placement Confirmation: ETT inserted through vocal cords under direct vision, positive ETCO2 and breath sounds checked- equal and bilateral Secured at: 20 cm Tube secured with: Tape Dental Injury: Teeth and Oropharynx as per pre-operative assessment  Comments: Smooth, atraumatic intubation. No complications noted.

## 2022-06-27 NOTE — Discharge Instructions (Signed)
 Instructions after Total Knee Replacement   Zachary Aberman M.D.     Dept. of Orthopaedics & Sports Medicine  [facility]  [address]  [place],   [address]  Phone: [phone]   Fax: [phone]    DIET: Drink plenty of non-alcoholic fluids. Resume your normal diet. Include foods high in fiber.  ACTIVITY:  You may use crutches or a walker with weight-bearing as tolerated, unless instructed otherwise. You may be weaned off of the walker or crutches by your Physical Therapist.  Do NOT place pillows under the knee. Anything placed under the knee could limit your ability to straighten the knee.   Continue doing gentle exercises. Exercising will reduce the pain and swelling, increase motion, and prevent muscle weakness.   Please continue to use the TED compression stockings for 2 weeks. You may remove the stockings at night, but should reapply them in the morning. Do not drive or operate any equipment until instructed.  WOUND CARE:  Continue to use the PolarCare or ice packs periodically to reduce pain and swelling. You may begin showering 3 days after surgery with honeycomb dressing. Remove honeycomb dressing 7 days after surgery and continue showering. Allow dermabond to fall off on its own.  MEDICATIONS: You may resume your regular medications. Please take the pain medication as prescribed on the medication. Do not take pain medication on an empty stomach. You have been given a prescription for a blood thinner (Lovenox or Coumadin). Please take the medication as instructed. (NOTE: After completing a 2 week course of Lovenox, take one 81 mg Enteric-coated aspirin twice a day for 3 additional weeks. This along with elevation will help reduce the possibility of phlebitis in your operated leg.) Do not drive or drink alcoholic beverages when taking pain medications.  POSTOPERATIVE CONSTIPATION PROTOCOL Constipation - defined medically as fewer than three stools per  week and severe constipation as less than one stool per week.  One of the most common issues patients have following surgery is constipation.  Even if you have a regular bowel pattern at home, your normal regimen is likely to be disrupted due to multiple reasons following surgery.  Combination of anesthesia, postoperative narcotics, change in appetite and fluid intake all can affect your bowels.  In order to avoid complications following surgery, here are some recommendations in order to help you during your recovery period.  Colace (docusate) - Pick up an over-the-counter form of Colace or another stool softener and take twice a day as long as you are requiring postoperative pain medications.  Take with a full glass of water daily.  If you experience loose stools or diarrhea, hold the colace until you stool forms back up.  If your symptoms do not get better within 1 week or if they get worse, check with your doctor.  Dulcolax (bisacodyl) - Pick up over-the-counter and take as directed by the product packaging as needed to assist with the movement of your bowels.  Take with a full glass of water.  Use this product as needed if not relieved by Colace only.   MiraLax (polyethylene glycol) - Pick up over-the-counter to have on hand.  MiraLax is a solution that will increase the amount of water in your bowels to assist with bowel movements.  Take as directed and can mix with a glass of water, juice, soda, coffee, or tea.  Take if you go more than two days without a movement. Do not use MiraLax more than once per day.   Call your doctor if you are still constipated or irregular after using this medication for 7 days in a row.  If you continue to have problems with postoperative constipation, please contact the office for further assistance and recommendations.  If you experience "the worst abdominal pain ever" or develop nausea or vomiting, please contact the office immediatly for further recommendations for  treatment.   CALL THE OFFICE FOR: Temperature above 101 degrees Excessive bleeding or drainage on the dressing. Excessive swelling, coldness, or paleness of the toes. Persistent nausea and vomiting.  FOLLOW-UP:  You should have an appointment to return to the office in 14 days after surgery. Arrangements have been made for continuation of Physical Therapy (either home therapy or outpatient therapy).  

## 2022-06-27 NOTE — Progress Notes (Signed)
PT Cancellation Note  Patient Details Name: [name] MRN: [id number] DOB: Nov 24, 1945   Cancelled Treatment:    Reason Eval/Treat Not Completed: Patient not medically ready (Chart reviewed, RN consulted. Evaluation attempted. Pt reports continued paresthesias of Left foot, 'numbness' of Left knee area. WIll defer PT eval to later date/time to allow for normalization of sensorium.)  3:54 PM, 06/27/22 Rosamaria Lints, PT, DPT Physical Therapist - [facility]  [phone] (ASCOM)    [name] 06/27/2022, 3:54 PM

## 2022-06-27 NOTE — Evaluation (Signed)
Physical Therapy Evaluation Patient Details Name: [name] MRN: [id number] DOB: [birth year] Today's Date: 06/27/2022  History of Present Illness  77 y/o female s/p L TKA 06/27/22.  Clinical Impression  Pt eager to work with PT, reports she has been doing her exercises pre-surgery and does SLRs on PTs arrival.  Pt did well with supine L LE exercises showing good quad control, minimal pain and great overall effort.  She had 99* of AROM knee flexion and was able to get to sitting w/o assist and standing with only incidental minA.  Despite much cuing about deliberate quad engagement, had a bout of L knee buckling with initial step/WBing on L and after just a few steps had presyncope sensation and needed to sit.  Nursing aware and assisting, BP 150s/60s and resolution of symptoms in recliner 2-3 minutes.  Pt able to rise again and step/turn to get back to bed.  Pt did well with mild post-op limitations disallowing longer bout of ambulation.  Pt will benefit from continued PT per TKA protocol.       Recommendations for follow up therapy are one component of a multi-disciplinary discharge planning process, led by the attending physician.  Recommendations may be updated based on patient status, additional functional criteria and insurance authorization.  Follow Up Recommendations       Assistance Recommended at Discharge Frequent or constant Supervision/Assistance  Patient can return home with the following  A little help with walking and/or transfers;A little help with bathing/dressing/bathroom;Assist for transportation;Help with stairs or ramp for entrance;Assistance with cooking/housework    Equipment Recommendations Rolling walker (2 wheels) (has rollator)  Recommendations for Other Services       Functional Status Assessment Patient has had a recent decline in their functional status and demonstrates the ability to make significant improvements in function in a reasonable and  predictable amount of time.     Precautions / Restrictions Precautions Precautions: Fall Restrictions Weight Bearing Restrictions: Yes LLE Weight Bearing: Weight bearing as tolerated      Mobility  Bed Mobility Overal bed mobility: Modified Independent             General bed mobility comments: Pt able to get to sitting EOB with relative ease    Transfers Overall transfer level: Needs assistance Equipment used: Rolling walker (2 wheels) Transfers: Sit to/from Stand Sit to Stand: Min assist           General transfer comment: able to rise from standard height bed and later recliner with CGA/minA: cuing for appropriate hand set up and sequencing, light tactile cuing to insure full/upright standing    Ambulation/Gait Ambulation/Gait assistance: Mod assist Gait Distance (Feet): 5 Feet Assistive device: Rolling walker (2 wheels)         General Gait Details: First step with low grade initial L knee buckling that she self arrested, able to control on the next few steps but c/o lightheadness and needed to sit quickly.  BP taken in reclined recliner (150s/60s).  Stairs            Wheelchair Mobility    Modified Rankin (Stroke Patients Only)       Balance Overall balance assessment: Needs assistance Sitting-balance support: No upper extremity supported Sitting balance-Leahy Scale: Good     Standing balance support: Bilateral upper extremity supported Standing balance-Leahy Scale: Fair Standing balance comment: reliant on walker to static standing, pt did have presyncopal episode after <1 minute of standing/walking  Pertinent Vitals/Pain Pain Assessment Pain Assessment: Faces Faces Pain Scale: Hurts little more Pain Location: pt reports no knee pain at rest and with initial light exercises... does reports some mild pain with initial ROM tasks    Home Living Family/patient expects to be discharged to:: Private  residence Living Arrangements: Spouse/significant other Available Help at Discharge: Family;Available 24 hours/day (husband works part time, will be around) Type of Home: House Home Access: Ramped entrance       Home Layout: One level Home Equipment: Rollator (4 wheels);BSC/3in1 (life alert)      Prior Function Prior Level of Function : Independent/Modified Independent             Mobility Comments: Pt reports she has had at least 3 falls in the last 6 months, normally able to drive and be active.  Has been working on prehab exercises.       Hand Dominance        Extremity/Trunk Assessment   Upper Extremity Assessment Upper Extremity Assessment: Overall WFL for tasks assessed    Lower Extremity Assessment Lower Extremity Assessment: Generalized weakness (expected post op weakness with good L quad engagement and SLR)       Communication   Communication: No difficulties  Cognition Arousal/Alertness: Awake/alert Behavior During Therapy: WFL for tasks assessed/performed Overall Cognitive Status: Within Functional Limits for tasks assessed                                 General Comments: talkative and jovial        General Comments General comments (skin integrity, edema, etc.): Pt eager to work with PT, c/o thigh numbness but showed great QS and SLR    Exercises Total Joint Exercises Ankle Circles/Pumps: AROM, 10 reps Quad Sets: Strengthening, 10 reps Short Arc Quad: AROM, Strengthening, 10 reps Heel Slides: AROM, 5 reps (resisted leg ext) Hip ABduction/ADduction: Strengthening, 10 reps Straight Leg Raises: AROM, 10 reps Knee Flexion: PROM, 5 reps Goniometric ROM: 0-99   Assessment/Plan    PT Assessment Patient needs continued PT services  PT Problem List Decreased strength;Decreased range of motion;Decreased activity tolerance;Decreased balance;Decreased mobility;Decreased knowledge of use of DME;Decreased safety awareness;Pain        PT Treatment Interventions DME instruction;Gait training;Stair training;Functional mobility training;Therapeutic activities;Therapeutic exercise;Balance training;Patient/family education    PT Goals (Current goals can be found in the Care Plan section)  Acute Rehab PT Goals Patient Stated Goal: go home PT Goal Formulation: With patient Time For Goal Achievement: 07/10/22 Potential to Achieve Goals: Good    Frequency BID     Co-evaluation               AM-PAC PT "6 Clicks" Mobility  Outcome Measure Help needed turning from your back to your side while in a flat bed without using bedrails?: A Little Help needed moving from lying on your back to sitting on the side of a flat bed without using bedrails?: A Little Help needed moving to and from a bed to a chair (including a wheelchair)?: A Little Help needed standing up from a chair using your arms (e.g., wheelchair or bedside chair)?: A Little Help needed to walk in hospital room?: A Lot Help needed climbing 3-5 steps with a railing? : A Lot 6 Click Score: 16    End of Session Equipment Utilized During Treatment: Gait belt Activity Tolerance: Patient tolerated treatment well (presyncope) Patient left: in bed;with call bell/phone within  reach;with nursing/sitter in room;with family/visitor present Nurse Communication: Mobility status PT Visit Diagnosis: Muscle weakness (generalized) (M62.81);Difficulty in walking, not elsewhere classified (R26.2);Pain Pain - Right/Left: Left Pain - part of body: Knee    Time: 1725-1815 PT Time Calculation (min) (ACUTE ONLY): 50 min   Charges:   PT Evaluation $PT Eval Low Complexity: 1 Low PT Treatments $Therapeutic Exercise: 8-22 mins $Therapeutic Activity: 8-22 mins        Malachi Pro, DPT 06/27/2022, 6:40 PM

## 2022-06-27 NOTE — Plan of Care (Signed)
  Problem: Pain Management: Goal: Pain level will decrease with appropriate interventions Outcome: Progressing   Problem: Skin Integrity: Goal: Will show signs of wound healing Outcome: Progressing   

## 2022-06-27 NOTE — H&P (Signed)
History of Present Illness: The patient is an 77 y.o. female seen in clinic today for follow-up of her left knee. The patient has had ongoing pain in her left knee for the last year. She reports aching constant pain over the anterior and lateral and posterior lateral aspect of her left knee which is worse with certain motions causing stabs of pain up to an 8 out of 10. Constant aching pain with walking at all times which is affecting her activities of daily living and preventing her from doing things she wants to do. She feels some grinding and catching in her knee which affects her ability to walk. She has used a knee sleeve, undergone formal physical therapy, undergone home exercises, had cortisone injections most recently 03/19/2022 with about a month of relief, and a series of gel injections with minimal improvement. The patient reports she is not interested in continue with any further conservative treatments and is ready to move forward with surgical intervention for her left knee arthritis as it is functionally limiting her.  The patient is a nondiabetic with most recent hemoglobin A1C of 5.9, BMI is 37.2, she is a non-smoker.  Past Medical History: Past Medical History:  Diagnosis Date  Allergic rhinitis  Anxiety  Atrophic vaginitis  Chronic airway disease  irritable airway disease  Depression  Dry eyes  Fibrocystic breast disease  GERD (gastroesophageal reflux disease)  HH (hiatus hernia)  Hyperlipidemia  Hypertension  Insomnia  Migraines  Uterovaginal prolapse   Past Surgical History: Past Surgical History:  Procedure Laterality Date  HYSTERECTOMY 1975  vaginal  BSO 08/1978  Rectocell 2009  spinal cord stimulator 05/25/2013  [employer]  COLONOSCOPY 01/29/2017  PH Adenomatous Polyps: CBF 01/2022  EGD 11/07/2017  Gastritis: No repeat per RTE  battery replacement of spinal stimulator 06/28/2021  COLONOSCOPY 05/18/2012, 12/10/2010  Adenomatous Polyps: CBF 04/2017   Past  Family History: Family History  Problem Relation Age of Onset  Heart disease Mother  Heart disease Father  Breast cancer Maternal Grandmother   Medications: Current Outpatient Medications  Medication Sig Dispense Refill  albuterol 90 mcg/actuation inhaler Inhale 2 inhalations into the lungs every 6 (six) hours as needed for Wheezing  ALPRAZolam (XANAX) 1 MG tablet Take 0.5-1 mg by mouth every 12 (twelve) hours as needed  ascorbate calcium (VITAMIN C ORAL) Take 1 tablet by mouth once daily  aspirin 81 MG chewable tablet Take 81 mg by mouth once daily.  atenolol (TENORMIN) 50 MG tablet Take 50 mg by mouth once daily.  calcium 500 mg Tab Take 1,000 mg by mouth 2 (two) times daily.  cholecalciferol (VITAMIN D3) 2,000 unit capsule Take by mouth.  cinnamon bark 500 mg capsule Take 1,000 mg by mouth 2 (two) times daily  citalopram (CELEXA) 20 MG tablet Take 30 mg by mouth every morning.   cyanocobalamin (VITAMIN B12) 1,000 mcg SL tablet Take 1,000 mcg by mouth once daily  cyclobenzaprine (FLEXERIL) 10 MG tablet TAKE 1 TABLET BY MOUTH THREE TIMES DAILY AS NEEDED FOR LOWER BACK PAIN  estradioL (ESTRACE) 0.01 % (0.1 mg/gram) vaginal cream Insert 0.5 - 1 gram 3 times a week as directed  fenofibrate 160 MG tablet TK 1 T PO D WF 3  fluticasone-salmeterol (ADVAIR DISKUS) 250-50 mcg/dose diskus inhaler Inhale 1 inhalation into the lungs 2 (two) times daily. 3 Inhaler 3  hydroCHLOROthiazide (HYDRODIURIL) 12.5 MG tablet Take 12.5 mg by mouth once daily (Patient not taking: Reported on 12/14/2021)  levothyroxine (SYNTHROID) 25 MCG tablet Take 25 mcg  by mouth once daily  magnesium oxide (MAG-OX) 400 mg (241.3 mg magnesium) tablet Take 400 mg by mouth once daily  meclizine (ANTIVERT) 25 mg tablet Take 25 mg by mouth 3 (three) times daily as needed  melatonin 10 mg Subl Place 5 mg under the tongue nightly  montelukast (SINGULAIR) 10 mg tablet TAKE 1 TABLET(10 MG) BY MOUTH EVERY DAY 90 tablet 3   nitrofurantoin (MACRODANTIN) 100 MG capsule Take 100 mg by mouth 2 (two) times daily (Patient not taking: Reported on 03/15/2022)  OM-3/E/LINOL/ALA/OLEIC/GLA/LIP (OMEGA 3-6-9 ORAL) Take 1 capsule by mouth 4 (four) times daily.  omeprazole (PRILOSEC) 40 MG DR capsule  rOPINIRole (REQUIP) 0.5 MG tablet Take 0.5 mg by mouth nightly  zinc 50 mg Tab Take 50 mg by mouth once daily  zolpidem (AMBIEN) 5 MG tablet Take 5 mg by mouth as directed for Sleep Take 1 tablet by mouth at bedtime as needed   No current facility-administered medications for this visit.   Allergies: Allergies  Allergen Reactions  Midazolam Anaphylaxis, Other (See Comments) and Swelling  Pt had a change in personality, acted weird per husband after conscious sedation with FentanylVersed  With morphine, hypoxia.  With morphine, hypoxia. Altered personality   Aspirin Other (See Comments)  Aspirin (Tartrazine Only) Other (See Comments)  (Full dose) Abdominal Pain  Fentanyl Other (See Comments)  Pt had a change in personality, acted weird after conscious sedation with Fentanyl/Versed  Morphine Hallucination  Niacin Hives  Penicillins Hives  Vicodin [Hydrocodone-Acetaminophen] Other (See Comments)  "Felt wired"    Visit Vitals: Vitals:  05/31/22 0916  BP: 130/70    Review of Systems:  A comprehensive 14 point ROS was performed, reviewed, and the pertinent orthopaedic findings are documented in the HPI.  Physical Exam: General/Constitutional: No apparent distress: well-nourished and well developed. Eyes: Pupils equal, round with synchronous movement. Pulmonary exam: Lungs clear to auscultation bilaterally no wheezing rales or rhonchi Cardiac exam: Regular rate and rhythm no obvious murmurs rubs or gallops. Integumentary: No impressive skin lesions present, except as noted in detailed exam. Neuro/Psych: Normal mood and affect, oriented to person, place and time.  Comprehensive Knee Exam: Gait Antalgic   Alignment Left knee valgus right knee varus   Inspection Right Left  Skin Normal appearance with no obvious deformity. No ecchymosis or erythema. Normal appearance with no obvious deformity. No ecchymosis or erythema.  Soft Tissue No focal soft tissue swelling No focal soft tissue swelling  Quad Atrophy None None   Palpation  Right Left  Tenderness Mild medial joint line tenderness Medial and lateral joint line tenderness parapatellar tenderness  Crepitus No patellofemoral or tibiofemoral crepitus + patellofemoral and tibiofemoral crepitus  Effusion None None   Range of Motion Right Left  Flexion 0-125 0-115  Extension Full knee extension without hyperextension Full knee extension without hyperextension   Ligamentous Exam Right Left  Lachman Normal Normal  Valgus 0 Normal Normal  Valgus 30 Normal Normal  Varus 0 Normal Normal  Varus 30 Normal Normal  Anterior Drawer Normal Normal  Posterior Drawer Normal Normal   Meniscal Exam Right Left  Hyperflexion Test Negative Positive  Hyperextension Test Negative Negative  McMurray's Negative Positive    Neurovascular Right Left  Quadriceps Strength 5/5 5/5  Hamstring Strength 5/5 5/5  Hip Abductor Strength 4/5 4/5  Distal Motor Normal Normal  Distal Sensory Normal light touch sensation Normal light touch sensation  Distal Pulses Normal Normal    Imaging Studies: I have reviewed AP, lateral,sunrise, and flexed  PA weight bearing knee X-rays (4 views) of the left knee ordered and taken today in the office show severe degenerative changes with lateral joint space narrowing with bone-on-bone articulation, osteophyte formation, subchondral cysts and sclerosis. Kellgren-Lawrence grade 4. AP, sunrise, and flexed PA of the right knee also show medial joint space narrowing with osteophyte formation and sclerosis Kellgren-Lawrence grade 2/3. No fractures or dislocations noted in either knee.   Assessment:  ICD-10-CM  1.  Osteoarthritis of left knee, unspecified osteoarthritis type M17.12    Plan: Lunabelle is a 77 year old female who presents with left knee bone on bone arthritis. Based upon the patient's continued symptoms and failure to respond to conservative treatment, I have recommended a left total knee replacement for this patient. A long discussion took place with the patient describing what a total joint replacement is and what the procedure would entail. A knee model, similar to the implants that will be used during the operation, was utilized to demonstrate the implants. Choices of implant manufactures were discussed and reviewed. The ability to secure the implant utilizing cement or cementless (press fit) fixation was discussed. The approach and exposure was discussed.   The hospitalization and post-operative care and rehabilitation were also discussed. The use of perioperative antibiotics and DVT prophylaxis were discussed. The risk, benefits and alternatives to a surgical intervention were discussed at length with the patient. The patient was also advised of risks related to the medical comorbidities and elevated body mass index (BMI). A lengthy discussion took place to review the most common complications including but not limited to: stiffness, loss of function, complex regional pain syndrome, deep vein thrombosis, pulmonary embolus, heart attack, stroke, infection, wound breakdown, numbness, intraoperative fracture, damage to nerves, tendon,muscles, arteries or other blood vessels, death and other possible complications from anesthesia. The patient was told that we will take steps to minimize these risks by using sterile technique, antibiotics and DVT prophylaxis when appropriate and follow the patient postoperatively in the office setting to monitor progress. The possibility of recurrent pain, no improvement in pain and actual worsening of pain were also discussed with the patient.   The discharge plan of  care focused on the patient going home following surgery. The patient was encouraged to make the necessary arrangements to have someone stay with them when they are discharged home.   The benefits of surgery were discussed with the patient including the potential for improving the patient's current clinical condition through operative intervention. Alternatives to surgical intervention including continued conservative management were also discussed in detail. All questions were answered to the satisfaction of the patient. The patient participated and agreed to the plan of care as well as the use of the recommended implants for their total knee replacement surgery. An information packet was given to the patient to review prior to surgery.   Patient will need medical clearance for the surgery. All questions answered and the patient agrees to the above plan to proceed preparations for left total knee replacement.  Portions of this record have been created using Scientist, clinical (histocompatibility and immunogenetics). Dictation errors have been sought, but may not have been identified and corrected.  Reinaldo Berber MD

## 2022-06-27 NOTE — Op Note (Signed)
Patient Name: [name]  ZOX:[id number]  Pre-Operative Diagnosis: Left knee Osteoarthritis  Post-Operative Diagnosis: (same)  Procedure: Left Total Knee Arthroplasty  Components/Implants: Femur: Persona Size 6 Narrow CR   Tibia: Persona Size D w/ 14x68mm stem extension  Poly: 12mm MC  Patella: 32x8.25mm symmetric  Femoral Valgus Cut Angle: 5 degrees  Distal Femoral Re-cut: none  Patella Resurfacing: yes   Date of Surgery: 06/27/2022  Surgeon: Reinaldo Berber MD  Assistant: Amador Cunas PA (present and scrubbed throughout the case, critical for assistance with exposure, retraction, instrumentation, and closure)   Anesthesiologist: Piscitello  Anesthesia: General   Tourniquet Time: 56 min  EBL: 25cc  IVF: 800cc  Complications: None   Brief history: The patient is a 77 year old female with a history of osteoarthritis of the left knee with pain limiting their range of motion and activities of daily living, which has failed multiple attempts at conservative therapy.  The risks and benefits of total knee arthroplasty as definitive surgical treatment were discussed with the patient, who opted to proceed with the operation.  After outpatient medical clearance and optimization was completed the patient was admitted to [facility] for the procedure.  All preoperative films were reviewed and an appropriate surgical plan was made prior to surgery.   Description of procedure: The patient was brought to the operating room where laterality was confirmed by all those present to be the left side.   Spinal anesthesia was administered and the patient received an intravenous dose of antibiotics for surgical prophylaxis and a dose of tranexamic acid.  Patient is positioned supine on the operating room table with all bony prominences well-padded.  A well-padded tourniquet was applied to the left thigh.  The knee was then prepped and draped in usual sterile fashion with  multiple layers of adhesive and nonadhesive drapes.  All of those present in the operating room participated in a surgical timeout laterality and patient were confirmed.   An Esmarch was wrapped around the extremity and the leg was elevated and the knee flexed.  The tourniquet was inflated to a pressure of 275 mmHg. The Esmarch was removed and the leg was brought down to full extension.  The patella and tibial tubercle identified and outlined using a marking pen and a midline skin incision was made with a knife carried through the subcutaneous tissue down to the extensor retinaculum.  After exposure of the extensor mechanism the medial parapatellar arthrotomy was performed with a scalpel and electrocautery extending down medial and distal to the tibial tubercle taking care to avoid incising the patellar tendon.   A standard medial release was performed over the proximal tibia.  The knee was brought into extension in order to excise the fat pad taking care not to damage the patella tendon.  The superior soft tissue was removed from the anterior surface of the distal femur to visualize for the procedure.  The knee was then brought into flexion with the patella subluxed laterally and subluxing the tibia anteriorly.  The ACL was transected and removed with electrocautery and additional soft tissue was removed from the proximal surface of the tibia to fully expose. The PCL was found to be intact and was preserved.  An extramedullary tibial cutting guide was then applied to the leg with a spring-loaded ankle clamp placed around the distal tibia just above the malleoli the angulation of the guide was adjusted to give some posterior slope in the tibial resection with an appropriate varus/valgus alignment.  The resection  guide was then pinned to the proximal tibia and the proximal tibial surface was resected with an oscillating saw.  Careful attention was paid to ensure the blade did not disrupt any of the soft tissues  including any lateral or medial ligament.  Attention was then turned to the femur, with the knee slightly flexed a opening drill was used to enter the medullary canal of the femur.  After removing the drill marrow was suctioned out to decompress the distal femur.  An intramedullary femoral guide was then inserted into the drill hole and the alignment guide was seated firmly against the distal end of the medial femoral condyle.  The distal femoral cutting guide was then attached and pinned securely to the anterior surface of the femur and the intramedullary rod and alignment guide was removed.  Distal femur resection was then performed with an oscillating saw with retractors protecting medial and laterally.   The distal cutting block was then removed and the extension gap was checked with a spacer.  Extension gap was found to be appropriately sized to accommodate the spacer block.   The femoral sizing guide was then placed securely into the posterior condyles of the femur and the femoral size was measured and determined to be 6.  The size 6; 4-in-1 cutting guide was placed in position and secured with 2 pins.  The anterior posterior and chamfer resections were then performed with an oscillating saw.  Bony fragments and osteophytes were then removed.  Using a lamina spreader the posterior medial and lateral condyles were checked for additional osteophytes and posterior soft tissue remnants.  Any remaining meniscus was removed at this time.  Periarticular injection was performed in the meniscal rims and posterior capsule with aspiration performed to ensure no intravascular injection.   The tibia was then exposed and the tibial trial was pinned onto the plateau after confirming appropriate orientation and rotation.  Using the drill bushing the tibia was prepared to the appropriate drill depth.  Tibial broach impactor was then driven through the punch guide using a mallet.  The femoral trial component was then  inserted onto the femur.  A trial tibial polyethylene bearing was then placed and the knee was reduced.  The knee achieved full extension with no hyperextension and was found to be balanced in flexion and extension with the trials in place.  The knee was then brought into full extension the patella was everted and held with 2 Kocher clamps.  The articular surface of the patella was then resected with an patella reamer and saw after careful measurement with a caliper.  The patella was then prepared with the drill guide and a trial patella was placed.  The knee was then taken through range of motion and it was found that the patella articulated appropriately with the trochlea and good patellofemoral motion without subluxation.    The correct final components for implantation were confirmed and opened by the circulator nurse.  The prepared surfaces of the patella femur and tibia were cleaned with pulsatile lavage to remove all blood fat and other material and then the surfaces were dried.  2 bags of cement were mixed under vacuum and the components were cemented into place.  Excess cement was removed with curettes and forceps. A trial polyethylene tibial component was placed and the knee was brought into extension to allow the cement to set.  At this time the periarticular injection cocktail was placed in the soft tissues surrounding the knee.  After full  curing of the cement the balance of the knee was checked again and the final polyethylene size was confirmed. The tibial component was irrigated and locking mechanism checked to ensure it was clear of debris. The real polyethylene tibial component was implanted and the knee was brought through a range of motion.   The knee was then irrigated with copious amount of normal saline via pulsatile lavage to remove all loose bodies and other debris.  The knee was then irrigated with surgiphor betadine based wash and reirrigated with saline.  The tourniquet was then  dropped and all bleeding vessels were identified and coagulated.  The arthrotomy was approximated with #1 Vicryl and closed with #2 Quill suture.  The knee was brought into slight flexion and the subcutaneous tissues were closed with 0 Vicryl, 2-0 Vicryl and a running subcuticular 3-0 monoderm quil suture.  Skin was then glued with Dermabond.  A sterile adhesive dressing was then placed along with a sequential compression device to the calf, a Ted stocking, and a cryotherapy cuff.   Sponge, needle, and Lap counts were all correct at the end of the case.   The patient was transferred off of the operating room table to a hospital bed, good pulses were found distally on the operative side.  The patient was transferred to the recovery room in stable condition.
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
## 2022-06-27 NOTE — Anesthesia Postprocedure Evaluation (Signed)
Anesthesia Post Note  Patient: [name]  Procedure(s) Performed: TOTAL KNEE ARTHROPLASTY (Left: Knee)  Patient location during evaluation: PACU Anesthesia Type: General Level of consciousness: awake and alert Pain management: pain level controlled Vital Signs Assessment: post-procedure vital signs reviewed and stable Respiratory status: spontaneous breathing, nonlabored ventilation, respiratory function stable and patient connected to nasal cannula oxygen Cardiovascular status: blood pressure returned to baseline and stable Postop Assessment: no apparent nausea or vomiting Anesthetic complications: no   No notable events documented.   Last Vitals:  Vitals:   06/27/22 1145 06/27/22 1200  BP: 118/64 107/66  Pulse: 69 66  Resp: 18 16  Temp:    SpO2: 97% 97%    Last Pain:  Vitals:   06/27/22 1130  TempSrc:   PainSc: 2                  Cleda Mccreedy Cammi Consalvo

## 2022-06-27 NOTE — Transfer of Care (Signed)
Immediate Anesthesia Transfer of Care Note  Patient: [name]  Procedure(s) Performed: TOTAL KNEE ARTHROPLASTY (Left: Knee)  Patient Location: PACU  Anesthesia Type:General  Level of Consciousness: awake, oriented, and patient cooperative  Airway & Oxygen Therapy: Patient Spontanous Breathing and Patient connected to face mask oxygen  Post-op Assessment: Report given to RN and Post -op Vital signs reviewed and stable  Post vital signs: Reviewed and stable  Last Vitals:  Vitals Value Taken Time  BP 101/70 06/27/22 0931  Temp    Pulse 62 06/27/22 0935  Resp 23 06/27/22 0935  SpO2 97 % 06/27/22 0935  Vitals shown include unvalidated device data.  Last Pain:  Vitals:   06/27/22 8119  TempSrc: Oral  PainSc: 0-No pain         Complications: No notable events documented.

## 2022-06-28 DIAGNOSIS — Z8616 Personal history of COVID-19: Secondary | ICD-10-CM | POA: Diagnosis not present

## 2022-06-28 DIAGNOSIS — Z79899 Other long term (current) drug therapy: Secondary | ICD-10-CM | POA: Diagnosis not present

## 2022-06-28 DIAGNOSIS — J45909 Unspecified asthma, uncomplicated: Secondary | ICD-10-CM | POA: Diagnosis not present

## 2022-06-28 DIAGNOSIS — Z7982 Long term (current) use of aspirin: Secondary | ICD-10-CM | POA: Diagnosis not present

## 2022-06-28 DIAGNOSIS — Z85828 Personal history of other malignant neoplasm of skin: Secondary | ICD-10-CM | POA: Diagnosis not present

## 2022-06-28 DIAGNOSIS — M6281 Muscle weakness (generalized): Secondary | ICD-10-CM | POA: Diagnosis not present

## 2022-06-28 DIAGNOSIS — E039 Hypothyroidism, unspecified: Secondary | ICD-10-CM | POA: Diagnosis not present

## 2022-06-28 DIAGNOSIS — M1712 Unilateral primary osteoarthritis, left knee: Secondary | ICD-10-CM | POA: Diagnosis not present

## 2022-06-28 DIAGNOSIS — I10 Essential (primary) hypertension: Secondary | ICD-10-CM | POA: Diagnosis not present

## 2022-06-28 DIAGNOSIS — J449 Chronic obstructive pulmonary disease, unspecified: Secondary | ICD-10-CM | POA: Diagnosis not present

## 2022-06-28 LAB — BASIC METABOLIC PANEL
Anion gap: 7 (ref 5–15)
BUN: 17 mg/dL (ref 8–23)
CO2: 27 mmol/L (ref 22–32)
Calcium: 9.1 mg/dL (ref 8.9–10.3)
Chloride: 99 mmol/L (ref 98–111)
Creatinine, Ser: 0.78 mg/dL (ref 0.44–1.00)
GFR, Estimated: >60 mL/min (ref >60)
Glucose, Bld: 143 mg/dL — ABNORMAL HIGH (ref 70–99)
Potassium: 3.9 mmol/L (ref 3.5–5.1)
Sodium: 133 mmol/L — ABNORMAL LOW (ref 135–145)

## 2022-06-28 LAB — CBC
HCT: 35.7 % — ABNORMAL LOW (ref 36.0–46.0)
Hemoglobin: 11.8 g/dL — ABNORMAL LOW (ref 12.0–15.0)
MCH: 28.9 pg (ref 26.0–34.0)
MCHC: 33.1 g/dL (ref 30.0–36.0)
MCV: 87.3 fL (ref 80.0–100.0)
Platelets: 216 10*3/uL (ref 150–400)
RBC: 4.09 MIL/uL (ref 3.87–5.11)
RDW: 14.1 % (ref 11.5–15.5)
WBC: 11.5 10*3/uL — ABNORMAL HIGH (ref 4.0–10.5)
nRBC: 0 % (ref 0.0–0.2)

## 2022-06-28 MED ORDER — CELECOXIB 200 MG PO CAPS: 200.0000 mg | ORAL_CAPSULE | Freq: Two times a day (BID) | ORAL | 0 refills | Status: AC

## 2022-06-28 MED ORDER — ALPRAZOLAM 0.5 MG PO TABS
ORAL_TABLET | ORAL | Status: AC
  Filled 2022-06-28: qty 2

## 2022-06-28 MED ORDER — PANTOPRAZOLE SODIUM 40 MG PO TBEC
DELAYED_RELEASE_TABLET | ORAL | Status: AC
  Filled 2022-06-28: qty 1

## 2022-06-28 MED ORDER — DOCUSATE SODIUM 100 MG PO CAPS: 100.0000 mg | ORAL_CAPSULE | Freq: Two times a day (BID) | ORAL | 0 refills | Status: AC

## 2022-06-28 MED ORDER — ASPIRIN 81 MG PO CHEW
CHEWABLE_TABLET | ORAL | Status: AC
  Filled 2022-06-28: qty 1

## 2022-06-28 MED ORDER — ENOXAPARIN SODIUM 30 MG/0.3ML IJ SOSY
PREFILLED_SYRINGE | INTRAMUSCULAR | Status: AC
  Filled 2022-06-28: qty 0.3

## 2022-06-28 MED ORDER — TRAMADOL HCL 50 MG PO TABS
ORAL_TABLET | ORAL | Status: AC
  Filled 2022-06-28: qty 1

## 2022-06-28 MED ORDER — ENOXAPARIN SODIUM 40 MG/0.4ML IJ SOSY: 40.0000 mg | PREFILLED_SYRINGE | INTRAMUSCULAR | 0 refills | Status: DC

## 2022-06-28 MED ORDER — OXYCODONE HCL 5 MG PO TABS: 2.5000 mg | ORAL_TABLET | ORAL | 0 refills | Status: DC | PRN

## 2022-06-28 MED ORDER — ACETAMINOPHEN 500 MG PO TABS
ORAL_TABLET | ORAL | Status: AC
  Filled 2022-06-28: qty 2

## 2022-06-28 MED ORDER — ONDANSETRON HCL 4 MG PO TABS: 4.0000 mg | ORAL_TABLET | Freq: Four times a day (QID) | ORAL | 0 refills | Status: DC | PRN

## 2022-06-28 MED ORDER — HYDROCHLOROTHIAZIDE 25 MG PO TABS
ORAL_TABLET | ORAL | Status: AC
  Filled 2022-06-28: qty 1

## 2022-06-28 MED ORDER — OXYCODONE HCL 5 MG PO TABS
ORAL_TABLET | ORAL | Status: AC
  Filled 2022-06-28: qty 1

## 2022-06-28 MED ORDER — DOCUSATE SODIUM 100 MG PO CAPS
ORAL_CAPSULE | ORAL | Status: AC
  Filled 2022-06-28: qty 1

## 2022-06-28 MED ORDER — TRAMADOL HCL 50 MG PO TABS: 50.0000 mg | ORAL_TABLET | Freq: Four times a day (QID) | ORAL | 0 refills | Status: DC | PRN

## 2022-06-28 MED ORDER — ACETAMINOPHEN 500 MG PO TABS: 1000.0000 mg | ORAL_TABLET | Freq: Three times a day (TID) | ORAL | 0 refills | Status: DC

## 2022-06-28 MED ORDER — ATENOLOL 50 MG PO TABS
ORAL_TABLET | ORAL | Status: AC
  Filled 2022-06-28: qty 1

## 2022-06-28 MED ORDER — KETOROLAC TROMETHAMINE 15 MG/ML IJ SOLN
INTRAMUSCULAR | Status: AC
  Filled 2022-06-28: qty 1

## 2022-06-28 NOTE — TOC Transition Note (Signed)
Transition of Care [facility]) - CM/SW Discharge Note   Patient Details  Name: [name] MRN: [id number] Date of Birth: [birth year]  [facility]) CM/SW Contact:  Garret Reddish, RN Phone Number: 06/28/2022, 9:57 AM   Clinical Narrative:  Chart reviewed.  Patient was admitted with left total knee replacement.    I have spoken with [name]. She reports that she has a rolling walker with a seat and a BSC for home use.  Home Health preference has been provided and she has chosen to use pre-arranged home health agency [facility].    I have informed Kandee Keen with [facility] that patient will be a discharge for today.  [facility] will provide home health PT/OT.  I  Patient reports that her husband will transport her home today.   I have informed staff nurse of above information.        Final next level of care: Home w Home Health Services Barriers to Discharge: No Barriers Identified   Patient Goals and CMS Choice CMS Medicare.gov Compare Post Acute Care list provided to:: Patient Choice offered to / list presented to : Patient  Discharge Placement                      Patient and family notified of of transfer: 06/28/22  Discharge Plan and Services Additional resources added to the After Visit Summary for                  DME Arranged:  (Patient reports that she has all needed DME)         HH Arranged: PT, OT HH Agency: [facility] Date [facility] Agency Contacted: 06/28/22 Time HH Agency Contacted: 1000 Representative spoke with at [facility] Agency: Kandee Keen  Social Determinants of Health (SDOH) Interventions SDOH Screenings   Food Insecurity: No Food Insecurity (06/27/2022)  Housing: Medium Risk (06/27/2022)  Transportation Needs: No Transportation Needs (06/27/2022)  Utilities: Not At Risk (06/27/2022)  Depression (PHQ2-9): Low Risk  (06/06/2021)  Tobacco Use: Medium Risk (06/27/2022)     Readmission Risk Interventions     No data to display

## 2022-06-28 NOTE — Progress Notes (Signed)
The patient was discharged via wheelchair by nursing staff. Patient discharged with polar care, belongings and rolling walker. Discharge instructions reviewed, all questions answered.

## 2022-06-28 NOTE — Progress Notes (Signed)
Physical Therapy Treatment Patient Details Name: [name] MRN: [id number] DOB: 07-Nov-1945 Today's Date: 06/28/2022   History of Present Illness 77 y/o female s/p L TKA 06/27/22.    PT Comments    Pt was long sitting in recliner upon arrival. She is A and O x 4 and agreeable to session. She was easily and safely able to stand and ambulate with RW with no knee buckling noted. Pt was able to tolerate gait ~ 120 ft with CGA at first progressing to supervision only. Spouse present and given gait belt for additional safety. She correctly demonstrated performance of HEP handout. Pt has ramp entry to home and was unwilling to trial stair performance this session. She was educated on car transfers and how to perform stairs if she were to encounter. Pt is cleared from an acute PT standpoint for safe DC home with HHPT to follow.    Recommendations for follow up therapy are one component of a multi-disciplinary discharge planning process, led by the attending physician.  Recommendations may be updated based on patient status, additional functional criteria and insurance authorization.     Assistance Recommended at Discharge Frequent or constant Supervision/Assistance  Patient can return home with the following A little help with walking and/or transfers;A little help with bathing/dressing/bathroom;Assist for transportation;Help with stairs or ramp for entrance;Assistance with cooking/housework   Equipment Recommendations  Rolling walker (2 wheels)       Precautions / Restrictions Precautions Precautions: Fall Restrictions Weight Bearing Restrictions: Yes LLE Weight Bearing: Weight bearing as tolerated     Mobility  Bed Mobility  General bed mobility comments: pt was in recliner pre/post session    Transfers Overall transfer level: Needs assistance Equipment used: Rolling walker (2 wheels) Transfers: Sit to/from Stand Sit to Stand: Supervision   Ambulation/Gait Ambulation/Gait  assistance: Min guard, Supervision Gait Distance (Feet): 120 Feet Assistive device: Rolling walker (2 wheels) Gait Pattern/deviations: Step-to pattern, Antalgic Gait velocity: decreased  General Gait Details: No knee buckling or LOB. Distance limited by pt due to pain. Gait belt issued to spouse for additional safety with ambulation. No buckling present during standing activity.   Stairs Stairs: Yes    General stair comments: pt has ramp entry and was unwilling to attempt stairs this session. Chartered loss adjuster did educate pt on correct performance of stairs    Balance Overall balance assessment: Needs assistance Sitting-balance support: No upper extremity supported Sitting balance-Leahy Scale: Good     Standing balance support: Bilateral upper extremity supported Standing balance-Leahy Scale: Good Standing balance comment: no LOB with BUE support       Cognition Arousal/Alertness: Awake/alert Behavior During Therapy: WFL for tasks assessed/performed Overall Cognitive Status: Within Functional Limits for tasks assessed  General Comments: Pt A and O x4        Exercises Total Joint Exercises Ankle Circles/Pumps: AROM, 10 reps Quad Sets: AROM, 10 reps Heel Slides: AROM, 10 reps Hip ABduction/ADduction: AROM, 10 reps Straight Leg Raises: AROM, 10 reps        Pertinent Vitals/Pain Pain Assessment Pain Assessment: 0-10 Pain Score: 4  Faces Pain Scale: Hurts a little bit Pain Location: knee Pain Descriptors / Indicators: Discomfort Pain Intervention(s): Limited activity within patient's tolerance, Monitored during session, Premedicated before session, Repositioned, Ice applied     PT Goals (current goals can now be found in the care plan section) Acute Rehab PT Goals Patient Stated Goal: go home Progress towards PT goals: Progressing toward goals    Frequency    BID  PT Plan Current plan remains appropriate       AM-PAC PT "6 Clicks" Mobility   Outcome  Measure  Help needed turning from your back to your side while in a flat bed without using bedrails?: A Little Help needed moving from lying on your back to sitting on the side of a flat bed without using bedrails?: A Little Help needed moving to and from a bed to a chair (including a wheelchair)?: A Little Help needed standing up from a chair using your arms (e.g., wheelchair or bedside chair)?: A Little Help needed to walk in hospital room?: A Little Help needed climbing 3-5 steps with a railing? : A Little 6 Click Score: 18    End of Session Equipment Utilized During Treatment: Gait belt Activity Tolerance: Patient tolerated treatment well Patient left: in chair;with call bell/phone within reach;with nursing/sitter in room;with family/visitor present Nurse Communication: Mobility status PT Visit Diagnosis: Muscle weakness (generalized) (M62.81);Difficulty in walking, not elsewhere classified (R26.2);Pain Pain - Right/Left: Left Pain - part of body: Knee     Time: 1610-9604 PT Time Calculation (min) (ACUTE ONLY): 26 min  Charges:  $Gait Training: 8-22 mins $Therapeutic Exercise: 8-22 mins                    Jetta Lout PTA 06/28/22, 11:15 AM

## 2022-06-28 NOTE — Progress Notes (Signed)
Patient is not able to walk the distance required to go the bathroom, or she is unable to safely negotiate stairs required to access the bathroom.  A 3in1 BSC will alleviate this problem.       T. Chris Talibah Colasurdo, PA-C [facility] 

## 2022-06-28 NOTE — TOC Progression Note (Signed)
Transition of Care [facility]) - Progression Note    Patient Details  Name: AYLEIGH CUADRA MRN: [id number] Date of Birth: 1945/10/24  [facility]) CM/SW Contact  Garret Reddish, RN Phone Number: 06/28/2022, 10:22 AM  Clinical Narrative:   Received message from nurse that patient will need a 2 wheeled rolling walker for home use.  Patient has  rollator at home but will require a 2 wheeled rolling walker.  I have asked Barbara Cower with [employer] to provide patient with a 2 wheeled rolling walker at bedside today.    I have informed staff nurse.        Barriers to Discharge: No Barriers Identified  Expected Discharge Plan and Services         Expected Discharge Date: 06/28/22               DME Arranged:  (Patient reports that she has all needed DME)         HH Arranged: PT, OT HH Agency: [facility] Date [facility] Agency Contacted: 06/28/22 Time HH Agency Contacted: 1000 Representative spoke with at [facility] Agency: Kandee Keen   Social Determinants of Health (SDOH) Interventions SDOH Screenings   Food Insecurity: No Food Insecurity (06/27/2022)  Housing: Medium Risk (06/27/2022)  Transportation Needs: No Transportation Needs (06/27/2022)  Utilities: Not At Risk (06/27/2022)  Depression (PHQ2-9): Low Risk  (06/06/2021)  Tobacco Use: Medium Risk (06/27/2022)    Readmission Risk Interventions     No data to display
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
## 2022-06-28 NOTE — Plan of Care (Signed)
The patient deemed ready for discharge by provider and PT.  Problem: Education: Goal: Knowledge of the prescribed therapeutic regimen will improve Outcome: Adequate for Discharge Goal: Individualized Educational Video(s) Outcome: Adequate for Discharge   Problem: Activity: Goal: Ability to avoid complications of mobility impairment will improve Outcome: Adequate for Discharge Goal: Range of joint motion will improve Outcome: Adequate for Discharge   Problem: Clinical Measurements: Goal: Postoperative complications will be avoided or minimized Outcome: Adequate for Discharge   Problem: Pain Management: Goal: Pain level will decrease with appropriate interventions Outcome: Adequate for Discharge   Problem: Skin Integrity: Goal: Will show signs of wound healing Outcome: Adequate for Discharge

## 2022-07-01 DIAGNOSIS — G8192 Hemiplegia, unspecified affecting left dominant side: Secondary | ICD-10-CM | POA: Diagnosis not present

## 2022-07-01 DIAGNOSIS — M4726 Other spondylosis with radiculopathy, lumbar region: Secondary | ICD-10-CM | POA: Diagnosis not present

## 2022-07-01 DIAGNOSIS — G894 Chronic pain syndrome: Secondary | ICD-10-CM | POA: Diagnosis not present

## 2022-07-01 DIAGNOSIS — J45909 Unspecified asthma, uncomplicated: Secondary | ICD-10-CM | POA: Diagnosis not present

## 2022-07-01 DIAGNOSIS — J849 Interstitial pulmonary disease, unspecified: Secondary | ICD-10-CM | POA: Diagnosis not present

## 2022-07-01 DIAGNOSIS — Z471 Aftercare following joint replacement surgery: Secondary | ICD-10-CM | POA: Diagnosis not present

## 2022-07-01 DIAGNOSIS — M5136 Other intervertebral disc degeneration, lumbar region: Secondary | ICD-10-CM | POA: Diagnosis not present

## 2022-07-01 DIAGNOSIS — Z96652 Presence of left artificial knee joint: Secondary | ICD-10-CM | POA: Diagnosis not present

## 2022-07-01 DIAGNOSIS — I10 Essential (primary) hypertension: Secondary | ICD-10-CM | POA: Diagnosis not present

## 2022-07-03 DIAGNOSIS — I10 Essential (primary) hypertension: Secondary | ICD-10-CM | POA: Diagnosis not present

## 2022-07-03 DIAGNOSIS — Z96652 Presence of left artificial knee joint: Secondary | ICD-10-CM | POA: Diagnosis not present

## 2022-07-03 DIAGNOSIS — J45909 Unspecified asthma, uncomplicated: Secondary | ICD-10-CM | POA: Diagnosis not present

## 2022-07-03 DIAGNOSIS — M5136 Other intervertebral disc degeneration, lumbar region: Secondary | ICD-10-CM | POA: Diagnosis not present

## 2022-07-03 DIAGNOSIS — G894 Chronic pain syndrome: Secondary | ICD-10-CM | POA: Diagnosis not present

## 2022-07-03 DIAGNOSIS — M4726 Other spondylosis with radiculopathy, lumbar region: Secondary | ICD-10-CM | POA: Diagnosis not present

## 2022-07-03 DIAGNOSIS — Z471 Aftercare following joint replacement surgery: Secondary | ICD-10-CM | POA: Diagnosis not present

## 2022-07-03 DIAGNOSIS — J849 Interstitial pulmonary disease, unspecified: Secondary | ICD-10-CM | POA: Diagnosis not present

## 2022-07-03 DIAGNOSIS — G8192 Hemiplegia, unspecified affecting left dominant side: Secondary | ICD-10-CM | POA: Diagnosis not present

## 2022-07-04 DIAGNOSIS — Z96652 Presence of left artificial knee joint: Secondary | ICD-10-CM | POA: Diagnosis not present

## 2022-07-04 DIAGNOSIS — M5136 Other intervertebral disc degeneration, lumbar region: Secondary | ICD-10-CM | POA: Diagnosis not present

## 2022-07-04 DIAGNOSIS — G894 Chronic pain syndrome: Secondary | ICD-10-CM | POA: Diagnosis not present

## 2022-07-04 DIAGNOSIS — Z471 Aftercare following joint replacement surgery: Secondary | ICD-10-CM | POA: Diagnosis not present

## 2022-07-04 DIAGNOSIS — M4726 Other spondylosis with radiculopathy, lumbar region: Secondary | ICD-10-CM | POA: Diagnosis not present

## 2022-07-04 DIAGNOSIS — J45909 Unspecified asthma, uncomplicated: Secondary | ICD-10-CM | POA: Diagnosis not present

## 2022-07-04 DIAGNOSIS — J849 Interstitial pulmonary disease, unspecified: Secondary | ICD-10-CM | POA: Diagnosis not present

## 2022-07-04 DIAGNOSIS — I10 Essential (primary) hypertension: Secondary | ICD-10-CM | POA: Diagnosis not present

## 2022-07-04 DIAGNOSIS — G8192 Hemiplegia, unspecified affecting left dominant side: Secondary | ICD-10-CM | POA: Diagnosis not present

## 2022-07-05 DIAGNOSIS — I10 Essential (primary) hypertension: Secondary | ICD-10-CM | POA: Diagnosis not present

## 2022-07-05 DIAGNOSIS — G894 Chronic pain syndrome: Secondary | ICD-10-CM | POA: Diagnosis not present

## 2022-07-05 DIAGNOSIS — M4726 Other spondylosis with radiculopathy, lumbar region: Secondary | ICD-10-CM | POA: Diagnosis not present

## 2022-07-05 DIAGNOSIS — Z471 Aftercare following joint replacement surgery: Secondary | ICD-10-CM | POA: Diagnosis not present

## 2022-07-05 DIAGNOSIS — G8192 Hemiplegia, unspecified affecting left dominant side: Secondary | ICD-10-CM | POA: Diagnosis not present

## 2022-07-05 DIAGNOSIS — J849 Interstitial pulmonary disease, unspecified: Secondary | ICD-10-CM | POA: Diagnosis not present

## 2022-07-05 DIAGNOSIS — Z96652 Presence of left artificial knee joint: Secondary | ICD-10-CM | POA: Diagnosis not present

## 2022-07-05 DIAGNOSIS — M5136 Other intervertebral disc degeneration, lumbar region: Secondary | ICD-10-CM | POA: Diagnosis not present

## 2022-07-05 DIAGNOSIS — J45909 Unspecified asthma, uncomplicated: Secondary | ICD-10-CM | POA: Diagnosis not present

## 2022-07-08 DIAGNOSIS — J849 Interstitial pulmonary disease, unspecified: Secondary | ICD-10-CM | POA: Diagnosis not present

## 2022-07-08 DIAGNOSIS — M5136 Other intervertebral disc degeneration, lumbar region: Secondary | ICD-10-CM | POA: Diagnosis not present

## 2022-07-08 DIAGNOSIS — G8192 Hemiplegia, unspecified affecting left dominant side: Secondary | ICD-10-CM | POA: Diagnosis not present

## 2022-07-08 DIAGNOSIS — I10 Essential (primary) hypertension: Secondary | ICD-10-CM | POA: Diagnosis not present

## 2022-07-08 DIAGNOSIS — Z471 Aftercare following joint replacement surgery: Secondary | ICD-10-CM | POA: Diagnosis not present

## 2022-07-08 DIAGNOSIS — G894 Chronic pain syndrome: Secondary | ICD-10-CM | POA: Diagnosis not present

## 2022-07-08 DIAGNOSIS — Z96652 Presence of left artificial knee joint: Secondary | ICD-10-CM | POA: Diagnosis not present

## 2022-07-08 DIAGNOSIS — J45909 Unspecified asthma, uncomplicated: Secondary | ICD-10-CM | POA: Diagnosis not present

## 2022-07-08 DIAGNOSIS — M4726 Other spondylosis with radiculopathy, lumbar region: Secondary | ICD-10-CM | POA: Diagnosis not present

## 2022-07-09 DIAGNOSIS — Z96652 Presence of left artificial knee joint: Secondary | ICD-10-CM | POA: Diagnosis not present

## 2022-07-09 DIAGNOSIS — M5136 Other intervertebral disc degeneration, lumbar region: Secondary | ICD-10-CM | POA: Diagnosis not present

## 2022-07-09 DIAGNOSIS — G894 Chronic pain syndrome: Secondary | ICD-10-CM | POA: Diagnosis not present

## 2022-07-09 DIAGNOSIS — G8192 Hemiplegia, unspecified affecting left dominant side: Secondary | ICD-10-CM | POA: Diagnosis not present

## 2022-07-09 DIAGNOSIS — Z471 Aftercare following joint replacement surgery: Secondary | ICD-10-CM | POA: Diagnosis not present

## 2022-07-09 DIAGNOSIS — M4726 Other spondylosis with radiculopathy, lumbar region: Secondary | ICD-10-CM | POA: Diagnosis not present

## 2022-07-09 DIAGNOSIS — J45909 Unspecified asthma, uncomplicated: Secondary | ICD-10-CM | POA: Diagnosis not present

## 2022-07-09 DIAGNOSIS — I10 Essential (primary) hypertension: Secondary | ICD-10-CM | POA: Diagnosis not present

## 2022-07-09 DIAGNOSIS — J849 Interstitial pulmonary disease, unspecified: Secondary | ICD-10-CM | POA: Diagnosis not present

## 2022-07-10 DIAGNOSIS — Z471 Aftercare following joint replacement surgery: Secondary | ICD-10-CM | POA: Diagnosis not present

## 2022-07-11 DIAGNOSIS — I10 Essential (primary) hypertension: Secondary | ICD-10-CM | POA: Diagnosis not present

## 2022-07-11 DIAGNOSIS — M5136 Other intervertebral disc degeneration, lumbar region: Secondary | ICD-10-CM | POA: Diagnosis not present

## 2022-07-11 DIAGNOSIS — Z471 Aftercare following joint replacement surgery: Secondary | ICD-10-CM | POA: Diagnosis not present

## 2022-07-11 DIAGNOSIS — G8192 Hemiplegia, unspecified affecting left dominant side: Secondary | ICD-10-CM | POA: Diagnosis not present

## 2022-07-11 DIAGNOSIS — G894 Chronic pain syndrome: Secondary | ICD-10-CM | POA: Diagnosis not present

## 2022-07-11 DIAGNOSIS — J849 Interstitial pulmonary disease, unspecified: Secondary | ICD-10-CM | POA: Diagnosis not present

## 2022-07-11 DIAGNOSIS — M4726 Other spondylosis with radiculopathy, lumbar region: Secondary | ICD-10-CM | POA: Diagnosis not present

## 2022-07-11 DIAGNOSIS — Z96652 Presence of left artificial knee joint: Secondary | ICD-10-CM | POA: Diagnosis not present

## 2022-07-11 DIAGNOSIS — J45909 Unspecified asthma, uncomplicated: Secondary | ICD-10-CM | POA: Diagnosis not present

## 2022-07-12 DIAGNOSIS — M25562 Pain in left knee: Secondary | ICD-10-CM | POA: Diagnosis not present

## 2022-07-12 DIAGNOSIS — M25662 Stiffness of left knee, not elsewhere classified: Secondary | ICD-10-CM | POA: Diagnosis not present

## 2022-07-12 DIAGNOSIS — M6281 Muscle weakness (generalized): Secondary | ICD-10-CM | POA: Diagnosis not present

## 2022-07-12 DIAGNOSIS — Z96652 Presence of left artificial knee joint: Secondary | ICD-10-CM | POA: Diagnosis not present

## 2022-07-12 DIAGNOSIS — G8929 Other chronic pain: Secondary | ICD-10-CM | POA: Diagnosis not present

## 2022-07-17 DIAGNOSIS — M6281 Muscle weakness (generalized): Secondary | ICD-10-CM | POA: Diagnosis not present

## 2022-07-17 DIAGNOSIS — M25662 Stiffness of left knee, not elsewhere classified: Secondary | ICD-10-CM | POA: Diagnosis not present

## 2022-07-17 DIAGNOSIS — Z96652 Presence of left artificial knee joint: Secondary | ICD-10-CM | POA: Diagnosis not present

## 2022-07-17 DIAGNOSIS — M25562 Pain in left knee: Secondary | ICD-10-CM | POA: Diagnosis not present

## 2022-07-17 DIAGNOSIS — G8929 Other chronic pain: Secondary | ICD-10-CM | POA: Diagnosis not present

## 2022-07-19 DIAGNOSIS — M25662 Stiffness of left knee, not elsewhere classified: Secondary | ICD-10-CM | POA: Diagnosis not present

## 2022-07-19 DIAGNOSIS — Z96652 Presence of left artificial knee joint: Secondary | ICD-10-CM | POA: Diagnosis not present

## 2022-07-19 DIAGNOSIS — G8929 Other chronic pain: Secondary | ICD-10-CM | POA: Diagnosis not present

## 2022-07-19 DIAGNOSIS — M25562 Pain in left knee: Secondary | ICD-10-CM | POA: Diagnosis not present

## 2022-07-19 DIAGNOSIS — M6281 Muscle weakness (generalized): Secondary | ICD-10-CM | POA: Diagnosis not present

## 2022-07-24 DIAGNOSIS — M25662 Stiffness of left knee, not elsewhere classified: Secondary | ICD-10-CM | POA: Diagnosis not present

## 2022-07-24 DIAGNOSIS — M6281 Muscle weakness (generalized): Secondary | ICD-10-CM | POA: Diagnosis not present

## 2022-07-24 DIAGNOSIS — M25562 Pain in left knee: Secondary | ICD-10-CM | POA: Diagnosis not present

## 2022-07-24 DIAGNOSIS — G8929 Other chronic pain: Secondary | ICD-10-CM | POA: Diagnosis not present

## 2022-07-24 DIAGNOSIS — Z96652 Presence of left artificial knee joint: Secondary | ICD-10-CM | POA: Diagnosis not present

## 2022-07-31 DIAGNOSIS — N7689 Other specified inflammation of vagina and vulva: Secondary | ICD-10-CM | POA: Diagnosis not present

## 2022-07-31 DIAGNOSIS — R300 Dysuria: Secondary | ICD-10-CM | POA: Diagnosis not present

## 2022-08-02 DIAGNOSIS — M6281 Muscle weakness (generalized): Secondary | ICD-10-CM | POA: Diagnosis not present

## 2022-08-02 DIAGNOSIS — M25662 Stiffness of left knee, not elsewhere classified: Secondary | ICD-10-CM | POA: Diagnosis not present

## 2022-08-02 DIAGNOSIS — G8929 Other chronic pain: Secondary | ICD-10-CM | POA: Diagnosis not present

## 2022-08-02 DIAGNOSIS — M25562 Pain in left knee: Secondary | ICD-10-CM | POA: Diagnosis not present

## 2022-08-02 DIAGNOSIS — Z96652 Presence of left artificial knee joint: Secondary | ICD-10-CM | POA: Diagnosis not present

## 2022-08-07 DIAGNOSIS — Z96652 Presence of left artificial knee joint: Secondary | ICD-10-CM | POA: Diagnosis not present

## 2022-08-09 DIAGNOSIS — Z96652 Presence of left artificial knee joint: Secondary | ICD-10-CM | POA: Diagnosis not present

## 2022-08-10 ENCOUNTER — Emergency Department
Admission: EM | Admit: 2022-08-10 | Discharge: 2022-08-10 | Disposition: A | Discharge status: Home / Self Care | Payer: Medicare PPO | Attending: Emergency Medicine | Admitting: Emergency Medicine

## 2022-08-10 ENCOUNTER — Other Ambulatory Visit: Payer: Self-pay

## 2022-08-10 ENCOUNTER — Emergency Department: Payer: Medicare PPO

## 2022-08-10 DIAGNOSIS — K219 Gastro-esophageal reflux disease without esophagitis: Secondary | ICD-10-CM | POA: Diagnosis not present

## 2022-08-10 DIAGNOSIS — E039 Hypothyroidism, unspecified: Secondary | ICD-10-CM | POA: Insufficient documentation

## 2022-08-10 DIAGNOSIS — R0789 Other chest pain: Secondary | ICD-10-CM | POA: Diagnosis not present

## 2022-08-10 DIAGNOSIS — I700 Atherosclerosis of aorta: Secondary | ICD-10-CM | POA: Diagnosis not present

## 2022-08-10 DIAGNOSIS — R0602 Shortness of breath: Secondary | ICD-10-CM | POA: Diagnosis not present

## 2022-08-10 DIAGNOSIS — I10 Essential (primary) hypertension: Secondary | ICD-10-CM | POA: Insufficient documentation

## 2022-08-10 DIAGNOSIS — R0781 Pleurodynia: Secondary | ICD-10-CM | POA: Diagnosis not present

## 2022-08-10 DIAGNOSIS — R109 Unspecified abdominal pain: Secondary | ICD-10-CM | POA: Diagnosis present

## 2022-08-10 DIAGNOSIS — J45909 Unspecified asthma, uncomplicated: Secondary | ICD-10-CM | POA: Diagnosis not present

## 2022-08-10 LAB — TROPONIN I (HIGH SENSITIVITY): Troponin I (High Sensitivity): 9 ng/L (ref <18)

## 2022-08-10 LAB — CBC
HCT: 40.4 % (ref 36.0–46.0)
Hemoglobin: 12.9 g/dL (ref 12.0–15.0)
MCH: 28.8 pg (ref 26.0–34.0)
MCHC: 31.9 g/dL (ref 30.0–36.0)
MCV: 90.2 fL (ref 80.0–100.0)
Platelets: 274 10*3/uL (ref 150–400)
RBC: 4.48 MIL/uL (ref 3.87–5.11)
RDW: 12.9 % (ref 11.5–15.5)
WBC: 6 10*3/uL (ref 4.0–10.5)
nRBC: 0 % (ref 0.0–0.2)

## 2022-08-10 LAB — BASIC METABOLIC PANEL
Anion gap: 8 (ref 5–15)
BUN: 23 mg/dL (ref 8–23)
CO2: 31 mmol/L (ref 22–32)
Calcium: 10.3 mg/dL (ref 8.9–10.3)
Chloride: 102 mmol/L (ref 98–111)
Creatinine, Ser: 0.69 mg/dL (ref 0.44–1.00)
GFR, Estimated: >60 mL/min (ref >60)
Glucose, Bld: 114 mg/dL — ABNORMAL HIGH (ref 70–99)
Potassium: 3.4 mmol/L — ABNORMAL LOW (ref 3.5–5.1)
Sodium: 141 mmol/L (ref 135–145)

## 2022-08-10 MED ORDER — SUCRALFATE 1 G PO TABS: 1.0000 g | ORAL_TABLET | Freq: Four times a day (QID) | ORAL | 1 refills | Status: AC | PRN

## 2022-08-10 MED ORDER — SUCRALFATE 1 G PO TABS
1.0000 g | ORAL_TABLET | Freq: Once | ORAL | Status: AC
  Administered 2022-08-10: 1 g via ORAL
  Filled 2022-08-10: qty 1

## 2022-08-10 NOTE — ED Provider Notes (Signed)
[facility] Provider Note    Event Date/Time   First MD Initiated Contact with Patient 08/10/22 1711     (approximate)   History   Chest Pain   HPI  [name] is a 77 y.o. female with history of hypertension, hyperlipidemia, hypothyroidism, GERD, asthma, and anxiety who presents with chest pain over the last several days, substernal in location, sometimes rating to her back, described as a squeezing or burning, and associated with increased belching.  The patient denies any nausea or vomiting.  She denies any associated shortness of breath.  She does not feel lightheaded or dizzy.  The pain usually lasts for few seconds, sometimes slightly longer, and then resolves.  She will have this several times per day.  She denies any sustained chest pain.  She has no leg swelling.  The patient states that she has GERD and takes omeprazole.  She denies any cough or fever.  He has no abdominal pain.  I reviewed the past medical records.  The patient was admitted in May for a total knee replacement.  She has had no ED visits or admissions since that time.   Physical Exam   Triage Vital Signs: ED Triage Vitals  Encounter Vitals Group     BP 08/10/22 1542 (!) 151/65     Systolic BP Percentile --      Diastolic BP Percentile --      Pulse Rate 08/10/22 1542 65     Resp 08/10/22 1542 16     Temp 08/10/22 1542 98 F (36.7 C)     Temp Source 08/10/22 1542 Oral     SpO2 08/10/22 1542 94 %     Weight 08/10/22 1541 192 lb (87.1 kg)     Height 08/10/22 1541 5\' 1"  (1.549 m)     Head Circumference --      Peak Flow --      Pain Score 08/10/22 1541 0     Pain Loc --      Pain Education --      Exclude from Growth Chart --     Most recent vital signs: Vitals:   08/10/22 1542  BP: (!) 151/65  Pulse: 65  Resp: 16  Temp: 98 F (36.7 C)  SpO2: 94%     General: Awake, no distress.  CV:  Good peripheral perfusion.  Normal heart sounds. Resp:  Normal  effort.  Lungs CTAB. Abd:  Soft and nontender.  No distention.  Other:  No jaundice or scleral icterus.   ED Results / Procedures / Treatments   Labs (all labs ordered are listed, but only abnormal results are displayed) Labs Reviewed  BASIC METABOLIC PANEL - Abnormal; Notable for the following components:      Result Value   Potassium 3.4 (*)    Glucose, Bld 114 (*)    All other components within normal limits  CBC  TROPONIN I (HIGH SENSITIVITY)     EKG  ED ECG REPORT I, Dionne Bucy, the attending physician, personally viewed and interpreted this ECG.  Date: 08/10/2022 EKG Time: 1539 Rate: 69 Rhythm: normal sinus rhythm QRS Axis: normal Intervals: normal ST/T Wave abnormalities: normal Narrative Interpretation: no evidence of acute ischemia    RADIOLOGY  Chest x-ray: I independently viewed and interpreted the images; there is no focal consolidation or edema   PROCEDURES:  Critical Care performed: No  Procedures   MEDICATIONS ORDERED IN ED: Medications  sucralfate (CARAFATE) tablet 1 g (1 g  Oral Given 08/10/22 1819)     IMPRESSION / MDM / ASSESSMENT AND PLAN / ED COURSE  I reviewed the triage vital signs and the nursing notes.  77 year old female with PMH as noted above status post relatively recent left total knee arthroplasty presents with brief episodes of atypical chest pain over the last week associated with some belching and gas.  On exam the patient is well-appearing.  She is slightly hypertensive with otherwise normal vital signs.  Physical exam is otherwise unremarkable for acute findings.  EKG is normal.  Chest x-ray shows no acute findings.  BMP and CBC are within normal limits and troponin is negative.  Differential diagnosis includes, but is not limited to, GERD, gastritis, musculoskeletal pain.  I do not suspect ACS given the episodic and nonexertional nature of the symptoms as well as the normal EKG.  Given the duration that she has  been having this there is no indication for repeat troponin.  Presentation is also not consistent with PE or with dissection or other vascular etiology.  Patient's presentation is most consistent with acute complicated illness / injury requiring diagnostic workup.  Overall presentation is most consistent with GERD based on the patient's description of her symptoms.  She is already on omeprazole.  I will prescribe Carafate for her to use over the next few weeks.  I counseled her on the results of the workup.  At this time there is no indication for further ED treatment or workup.  She is stable for discharge home.  I gave her strict return precautions and she expressed understanding.   FINAL CLINICAL IMPRESSION(S) / ED DIAGNOSES   Final diagnoses:  Atypical chest pain  Gastroesophageal reflux disease without esophagitis     Rx / DC Orders   ED Discharge Orders          Ordered    sucralfate (CARAFATE) 1 g tablet  4 times daily PRN        08/10/22 1802             Note:  This document was prepared using Dragon voice recognition software and may include unintentional dictation errors.    Dionne Bucy, MD 08/10/22 1925

## 2022-08-10 NOTE — Discharge Instructions (Addendum)
Continue taking your omeprazole.  Take the Carafate up to 4 times daily as prescribed.  Return to the ER for new, worsening, or persistent severe chest pain, difficulty breathing, weakness or lightheadedness, vomiting, or any other new or worsening symptoms that concern you.

## 2022-08-10 NOTE — ED Triage Notes (Signed)
Pt to ED for central chest pain since 1 week that radiates to back. Comes and goes, currently 0/10. Worse with deep inspiration. States can get SOB when it is worse.  Knee surgery in May. Skin warm and dry. Resps unlabored.

## 2022-08-14 ENCOUNTER — Telehealth: Payer: Self-pay

## 2022-08-14 NOTE — Telephone Encounter (Signed)
Transition Care Management Follow-up Telephone Call Date of discharge and from where: 08/10/2022 [facility] How have you been since you were released from the hospital? Patient is still having chest pain sporadically.   Any questions or concerns? No  Items Reviewed: Did the pt receive and understand the discharge instructions provided? Yes  Medications obtained and verified? Yes  Other? No  Any new allergies since your discharge? No  Dietary orders reviewed? Yes Do you have support at home? Yes   Follow up appointments reviewed:  PCP Hospital f/u appt confirmed? Yes  Scheduled to see Burnell Blanks, MD on 08/15/2022 @ . Specialist Hospital f/u appt confirmed? No  Scheduled to see  on  @ . Are transportation arrangements needed? No  If their condition worsens, is the pt aware to call PCP or go to the Emergency Dept.? Yes Was the patient provided with contact information for the PCP's office or ED? Yes Was to pt encouraged to call back with questions or concerns? Yes  Heliodoro Domagalski [facility]  [facility] Population Health Community Resource Care Guide   [email or web address]  ?? [phone]   Website: [email or web address]  [email or web address]

## 2022-08-15 DIAGNOSIS — Z139 Encounter for screening, unspecified: Secondary | ICD-10-CM | POA: Diagnosis not present

## 2022-08-15 DIAGNOSIS — G4700 Insomnia, unspecified: Secondary | ICD-10-CM | POA: Diagnosis not present

## 2022-08-15 DIAGNOSIS — Z79899 Other long term (current) drug therapy: Secondary | ICD-10-CM | POA: Diagnosis not present

## 2022-08-15 DIAGNOSIS — N390 Urinary tract infection, site not specified: Secondary | ICD-10-CM | POA: Diagnosis not present

## 2022-08-15 DIAGNOSIS — F1320 Sedative, hypnotic or anxiolytic dependence, uncomplicated: Secondary | ICD-10-CM | POA: Diagnosis not present

## 2022-08-15 DIAGNOSIS — F419 Anxiety disorder, unspecified: Secondary | ICD-10-CM | POA: Diagnosis not present

## 2022-08-15 DIAGNOSIS — G319 Degenerative disease of nervous system, unspecified: Secondary | ICD-10-CM | POA: Diagnosis not present

## 2022-08-15 DIAGNOSIS — Z9181 History of falling: Secondary | ICD-10-CM | POA: Diagnosis not present

## 2022-08-22 DIAGNOSIS — K219 Gastro-esophageal reflux disease without esophagitis: Secondary | ICD-10-CM | POA: Diagnosis not present

## 2022-08-22 DIAGNOSIS — Z86010 Personal history of colonic polyps: Secondary | ICD-10-CM | POA: Diagnosis not present

## 2022-09-23 ENCOUNTER — Other Ambulatory Visit: Payer: Self-pay | Admitting: Family Medicine

## 2022-09-23 DIAGNOSIS — Z1231 Encounter for screening mammogram for malignant neoplasm of breast: Secondary | ICD-10-CM

## 2022-10-03 ENCOUNTER — Other Ambulatory Visit: Payer: Self-pay | Admitting: Family Medicine

## 2022-10-03 ENCOUNTER — Ambulatory Visit
Admission: RE | Admit: 2022-10-03 | Discharge: 2022-10-03 | Disposition: A | Discharge status: Home / Self Care | Payer: Medicare PPO | Source: Ambulatory Visit | Attending: Family Medicine | Admitting: Family Medicine

## 2022-10-03 DIAGNOSIS — Z1231 Encounter for screening mammogram for malignant neoplasm of breast: Secondary | ICD-10-CM

## 2023-01-08 ENCOUNTER — Ambulatory Visit: Payer: Medicare PPO | Admitting: Dermatology

## 2023-01-08 DIAGNOSIS — Z1283 Encounter for screening for malignant neoplasm of skin: Secondary | ICD-10-CM | POA: Diagnosis not present

## 2023-01-08 DIAGNOSIS — Z85828 Personal history of other malignant neoplasm of skin: Secondary | ICD-10-CM

## 2023-01-08 DIAGNOSIS — W908XXA Exposure to other nonionizing radiation, initial encounter: Secondary | ICD-10-CM | POA: Diagnosis not present

## 2023-01-08 DIAGNOSIS — L814 Other melanin hyperpigmentation: Secondary | ICD-10-CM

## 2023-01-08 DIAGNOSIS — L570 Actinic keratosis: Secondary | ICD-10-CM | POA: Diagnosis not present

## 2023-01-08 DIAGNOSIS — L821 Other seborrheic keratosis: Secondary | ICD-10-CM

## 2023-01-08 DIAGNOSIS — D229 Melanocytic nevi, unspecified: Secondary | ICD-10-CM

## 2023-01-08 DIAGNOSIS — L820 Inflamed seborrheic keratosis: Secondary | ICD-10-CM

## 2023-01-08 DIAGNOSIS — L578 Other skin changes due to chronic exposure to nonionizing radiation: Secondary | ICD-10-CM | POA: Diagnosis not present

## 2023-01-08 DIAGNOSIS — D692 Other nonthrombocytopenic purpura: Secondary | ICD-10-CM

## 2023-01-08 DIAGNOSIS — Z7189 Other specified counseling: Secondary | ICD-10-CM

## 2023-01-08 DIAGNOSIS — D1801 Hemangioma of skin and subcutaneous tissue: Secondary | ICD-10-CM

## 2023-01-08 DIAGNOSIS — L304 Erythema intertrigo: Secondary | ICD-10-CM

## 2023-01-08 DIAGNOSIS — Z79899 Other long term (current) drug therapy: Secondary | ICD-10-CM

## 2023-01-08 MED ORDER — KETOCONAZOLE 2 % EX CREA: TOPICAL_CREAM | CUTANEOUS | 0 refills | Status: AC

## 2023-01-08 NOTE — Patient Instructions (Signed)

## 2023-01-08 NOTE — Progress Notes (Signed)
Follow-Up Visit   Subjective  [name] is a 77 y.o. female who presents for the following: Skin Cancer Screening and Full Body Skin Exam  The patient presents for Total-Body Skin Exam (TBSE) for skin cancer screening and mole check. The patient has spots, moles and lesions to be evaluated, some may be new or changing and the patient may have concern these could be cancer.   The following portions of the chart were reviewed this encounter and updated as appropriate: medications, allergies, medical history  Review of Systems:  No other skin or systemic complaints except as noted in HPI or Assessment and Plan.  Objective  Well appearing patient in no apparent distress; mood and affect are within normal limits.  A full examination was performed including scalp, head, eyes, ears, nose, lips, neck, chest, axillae, abdomen, back, buttocks, bilateral upper extremities, bilateral lower extremities, hands, feet, fingers, toes, fingernails, and toenails. All findings within normal limits unless otherwise noted below.   Relevant physical exam findings are noted in the Assessment and Plan.  L brow x 1 Erythematous thin papules/macules with gritty scale.  Chest x 1, L brow x 1 (2) Erythematous stuck-on, waxy papule or plaque   Assessment & Plan   SKIN CANCER SCREENING PERFORMED TODAY.  ACTINIC DAMAGE - Chronic condition, secondary to cumulative UV/sun exposure - diffuse scaly erythematous macules with underlying dyspigmentation - Recommend daily broad spectrum sunscreen SPF 30+ to sun-exposed areas, reapply every 2 hours as needed.  - Staying in the shade or wearing long sleeves, sun glasses (UVA+UVB protection) and wide brim hats (4-inch brim around the entire circumference of the hat) are also recommended for sun protection.  - Call for new or changing lesions.  LENTIGINES, SEBORRHEIC KERATOSES, HEMANGIOMAS - Benign normal skin lesions - Benign-appearing - Call for any  changes  MELANOCYTIC NEVI - Tan-brown and/or pink-flesh-colored symmetric macules and papules - Benign appearing on exam today - Observation - Call clinic for new or changing moles - Recommend daily use of broad spectrum spf 30+ sunscreen to sun-exposed areas.   HISTORY OF BASAL CELL CARCINOMA OF THE SKIN - No evidence of recurrence today - Recommend regular full body skin exams - Recommend daily broad spectrum sunscreen SPF 30+ to sun-exposed areas, reapply every 2 hours as needed.  - Call if any new or changing lesions are noted between office visits  Purpura - Chronic; persistent and recurrent.  Treatable, but not curable. - Violaceous macules and patches - Benign - Related to trauma, age, sun damage and/or use of blood thinners, chronic use of topical and/or oral steroids - Observe - Can use OTC arnica containing moisturizer such as Dermend Bruise Formula if desired - Call for worsening or other concerns  AK (ACTINIC KERATOSIS) L brow x 1 Actinic keratoses are precancerous spots that appear secondary to cumulative UV radiation exposure/sun exposure over time. They are chronic with expected duration over 1 year. A portion of actinic keratoses will progress to squamous cell carcinoma of the skin. It is not possible to reliably predict which spots will progress to skin cancer and so treatment is recommended to prevent development of skin cancer.  Recommend daily broad spectrum sunscreen SPF 30+ to sun-exposed areas, reapply every 2 hours as needed.  Recommend staying in the shade or wearing long sleeves, sun glasses (UVA+UVB protection) and wide brim hats (4-inch brim around the entire circumference of the hat). Call for new or changing lesions.  Destruction of lesion - L brow x 1  Complexity: simple   Destruction method: cryotherapy   Informed consent: discussed and consent obtained   Timeout:  patient name, date of birth, surgical site, and procedure verified Lesion destroyed  using liquid nitrogen: Yes   Region frozen until ice ball extended beyond lesion: Yes   Outcome: patient tolerated procedure well with no complications   Post-procedure details: wound care instructions given   INFLAMED SEBORRHEIC KERATOSIS (2) Chest x 1, L brow x 1 (2) Symptomatic, irritating, patient would like treated.  Destruction of lesion - Chest x 1, L brow x 1 (2) Complexity: simple   Destruction method: cryotherapy   Informed consent: discussed and consent obtained   Timeout:  patient name, date of birth, surgical site, and procedure verified Lesion destroyed using liquid nitrogen: Yes   Region frozen until ice ball extended beyond lesion: Yes   Outcome: patient tolerated procedure well with no complications   Post-procedure details: wound care instructions given   ACTINIC SKIN DAMAGE   SKIN CANCER SCREENING   LENTIGO   MELANOCYTIC NEVUS, UNSPECIFIED LOCATION   PURPURA (HCC)   SEBORRHEIC KERATOSIS   HISTORY OF BASAL CELL CARCINOMA   INTERTRIGO Exam: Erythematous macerated patches R axilla  Chronic and persistent condition with duration or expected duration over one year. Condition is bothersome/symptomatic for patient. Currently flared.  Intertrigo is a chronic recurrent rash that occurs in skin fold areas that may be associated with friction; heat; moisture; yeast; fungus; and bacteria.  It is exacerbated by increased movement / activity; sweating; and higher atmospheric temperature.  Use of an absorbant powder such as Zeasorb AF powder or other OTC antifungal powder to the area daily can prevent rash recurrence. Other options to help keep the area dry include blow drying the area after bathing or using antiperspirant products such as Duradry sweat minimizing gel.  Treatment Plan: Start Ketoconazole 2% cream apply to aa QD.   Return in about 1 year (around 01/08/2024) for TBSE.  Maylene Roes, CMA, am acting as scribe for Armida Sans, MD  .  Documentation: I have reviewed the above documentation for accuracy and completeness, and I agree with the above.  Armida Sans, MD

## 2023-01-09 ENCOUNTER — Encounter: Payer: Self-pay | Admitting: Gastroenterology

## 2023-01-11 ENCOUNTER — Encounter: Payer: Self-pay | Admitting: Dermatology

## 2023-02-08 DIAGNOSIS — J45901 Unspecified asthma with (acute) exacerbation: Secondary | ICD-10-CM | POA: Diagnosis not present

## 2023-02-09 NOTE — Progress Notes (Signed)
(  02/10/2023) NO-SHOW to requested evaluation for low back pain.

## 2023-02-10 ENCOUNTER — Ambulatory Visit (HOSPITAL_BASED_OUTPATIENT_CLINIC_OR_DEPARTMENT_OTHER): Payer: Medicare HMO | Admitting: Pain Medicine

## 2023-02-10 DIAGNOSIS — M545 Low back pain, unspecified: Secondary | ICD-10-CM

## 2023-02-10 DIAGNOSIS — M532X6 Spinal instabilities, lumbar region: Secondary | ICD-10-CM

## 2023-02-10 DIAGNOSIS — M47816 Spondylosis without myelopathy or radiculopathy, lumbar region: Secondary | ICD-10-CM

## 2023-02-10 DIAGNOSIS — M4310 Spondylolisthesis, site unspecified: Secondary | ICD-10-CM

## 2023-02-10 DIAGNOSIS — Z91199 Patient's noncompliance with other medical treatment and regimen due to unspecified reason: Secondary | ICD-10-CM

## 2023-02-10 DIAGNOSIS — M47817 Spondylosis without myelopathy or radiculopathy, lumbosacral region: Secondary | ICD-10-CM

## 2023-02-10 DIAGNOSIS — Z969 Presence of functional implant, unspecified: Secondary | ICD-10-CM

## 2023-02-10 DIAGNOSIS — G8929 Other chronic pain: Secondary | ICD-10-CM
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
## 2023-02-19 ENCOUNTER — Ambulatory Visit: Payer: Medicare HMO | Attending: Pain Medicine | Admitting: Pain Medicine

## 2023-02-19 ENCOUNTER — Encounter: Payer: Self-pay | Admitting: Pain Medicine

## 2023-02-19 VITALS — BP 176/73 | HR 72 | Temp 96.8°F | Ht 61.0 in | Wt 192.0 lb

## 2023-02-19 DIAGNOSIS — M47817 Spondylosis without myelopathy or radiculopathy, lumbosacral region: Secondary | ICD-10-CM | POA: Insufficient documentation

## 2023-02-19 DIAGNOSIS — G8929 Other chronic pain: Secondary | ICD-10-CM | POA: Diagnosis not present

## 2023-02-19 DIAGNOSIS — M545 Low back pain, unspecified: Secondary | ICD-10-CM | POA: Insufficient documentation

## 2023-02-19 DIAGNOSIS — Z969 Presence of functional implant, unspecified: Secondary | ICD-10-CM | POA: Diagnosis not present

## 2023-02-19 DIAGNOSIS — M5459 Other low back pain: Secondary | ICD-10-CM | POA: Insufficient documentation

## 2023-02-19 DIAGNOSIS — M47816 Spondylosis without myelopathy or radiculopathy, lumbar region: Secondary | ICD-10-CM | POA: Diagnosis not present

## 2023-02-19 DIAGNOSIS — E66812 Obesity, class 2: Secondary | ICD-10-CM | POA: Insufficient documentation

## 2023-02-19 NOTE — Patient Instructions (Signed)
 ______________________________________________________________________    Procedure instructions  Stop blood-thinners  Do not eat or drink fluids (other than water) for 6 hours before your procedure  No water for 2 hours before your procedure  Take your blood pressure medicine with a sip of water  Arrive 30 minutes before your appointment  If sedation is planned, bring suitable driver. Pennie Banter, Benedetto Goad, & public transportation are NOT APPROVED)  Carefully read the "Preparing for your procedure" detailed instructions  If you have questions call us at [phone]  Procedure appointments are for procedures only. NO medication refills or new problem evaluations.   ______________________________________________________________________      ______________________________________________________________________    Preparing for your procedure  Appointments: If you think you may not be able to keep your appointment, call 24-48 hours in advance to cancel. We need time to make it available to others.  Procedure visits are for procedures only. During your procedure appointment there will be: NO Prescription Refills*. NO medication changes or discussions*. NO discussion of disability issues*. NO unrelated pain problem evaluations*. NO evaluations to order other pain procedures*. *These will be addressed at a separate and distinct evaluation encounter on the provider's evaluation schedule and not during procedure days.  Instructions: Food intake: Avoid eating anything solid for at least 8 hours prior to your procedure. Clear liquid intake: You may take clear liquids such as water up to 2 hours prior to your procedure. (No carbonated drinks. No soda.) Transportation: Unless otherwise stated by your physician, bring a driver. (Driver cannot be a Market researcher, Pharmacist, community, or any other form of public transportation.) Morning Medicines: Except for blood thinners, take all of your other morning  medications with a sip of water. Make sure to take your heart and blood pressure medicines. If your blood pressure's lower number is above 100, the case will be rescheduled. Blood thinners: Make sure to stop your blood thinners as instructed.  If you take a blood thinner, but were not instructed to stop it, call our office [phone] and ask to talk to a nurse. Not stopping a blood thinner prior to certain procedures could lead to serious complications. Diabetics on insulin: Notify the staff so that you can be scheduled 1st case in the morning. If your diabetes requires high dose insulin, take only  of your normal insulin dose the morning of the procedure and notify the staff that you have done so. Preventing infections: Shower with an antibacterial soap the morning of your procedure.  Build-up your immune system: Take 1000 mg of Vitamin C with every meal (3 times a day) the day prior to your procedure. Antibiotics: Inform the nursing staff if you are taking any antibiotics or if you have any conditions that may require antibiotics prior to procedures. (Example: recent joint implants)   Pregnancy: If you are pregnant make sure to notify the nursing staff. Not doing so may result in injury to the fetus, including death.  Sickness: If you have a cold, fever, or any active infections, call and cancel or reschedule your procedure. Receiving steroids while having an infection may result in complications. Arrival: You must be in the facility at least 30 minutes prior to your scheduled procedure. Tardiness: Your scheduled time is also the cutoff time. If you do not arrive at least 15 minutes prior to your procedure, you will be rescheduled.  Children: Do not bring any children with you. Make arrangements to keep them home. Dress appropriately: There is always a possibility that your clothing may get  soiled. Avoid long dresses. Valuables: Do not bring any jewelry or valuables.  Reasons to call and  reschedule or cancel your procedure: (Following these recommendations will minimize the risk of a serious complication.) Surgeries: Avoid having procedures within 2 weeks of any surgery. (Avoid for 2 weeks before or after any surgery). Flu Shots: Avoid having procedures within 2 weeks of a flu shots or . (Avoid for 2 weeks before or after immunizations). Barium: Avoid having a procedure within 7-10 days after having had a radiological study involving the use of radiological contrast. (Myelograms, Barium swallow or enema study). Heart attacks: Avoid any elective procedures or surgeries for the initial 6 months after a "Myocardial Infarction" (Heart Attack). Blood thinners: It is imperative that you stop these medications before procedures. Let us know if you if you take any blood thinner.  Infection: Avoid procedures during or within two weeks of an infection (including chest colds or gastrointestinal problems). Symptoms associated with infections include: Localized redness, fever, chills, night sweats or profuse sweating, burning sensation when voiding, cough, congestion, stuffiness, runny nose, sore throat, diarrhea, nausea, vomiting, cold or Flu symptoms, recent or current infections. It is specially important if the infection is over the area that we intend to treat. Heart and lung problems: Symptoms that may suggest an active cardiopulmonary problem include: cough, chest pain, breathing difficulties or shortness of breath, dizziness, ankle swelling, uncontrolled high or unusually low blood pressure, and/or palpitations. If you are experiencing any of these symptoms, cancel your procedure and contact your primary care physician for an evaluation.  Remember:  Regular Business hours are:  Monday to Thursday 8:00 AM to 4:00 PM  Provider's Schedule: Delano Metz, MD:  Procedure days: Tuesday and Thursday 7:30 AM to 4:00 PM  Edward Jolly, MD:  Procedure days: Monday and Wednesday 7:30 AM to 4:00  PM Last  Updated: 01/07/2023 ______________________________________________________________________      ______________________________________________________________________    General Risks and Possible Complications  Patient Responsibilities: It is important that you read this as it is part of your informed consent. It is our duty to inform you of the risks and possible complications associated with treatments offered to you. It is your responsibility as a patient to read this and to ask questions about anything that is not clear or that you believe was not covered in this document.  Patient's Rights: You have the right to refuse treatment. You also have the right to change your mind, even after initially having agreed to have the treatment done. However, under this last option, if you wait until the last second to change your mind, you may be charged for the materials used up to that point.  Introduction: Medicine is not an Visual merchandiser. Everything in Medicine, including the lack of treatment(s), carries the potential for danger, harm, or loss (which is by definition: Risk). In Medicine, a complication is a secondary problem, condition, or disease that can aggravate an already existing one. All treatments carry the risk of possible complications. The fact that a side effects or complications occurs, does not imply that the treatment was conducted incorrectly. It must be clearly understood that these can happen even when everything is done following the highest safety standards.  No treatment: You can choose not to proceed with the proposed treatment alternative. The "PRO(s)" would include: avoiding the risk of complications associated with the therapy. The "CON(s)" would include: not getting any of the treatment benefits. These benefits fall under one of three categories: diagnostic; therapeutic; and/or  palliative. Diagnostic benefits include: getting information which can ultimately lead to  improvement of the disease or symptom(s). Therapeutic benefits are those associated with the successful treatment of the disease. Finally, palliative benefits are those related to the decrease of the primary symptoms, without necessarily curing the condition (example: decreasing the pain from a flare-up of a chronic condition, such as incurable terminal cancer).  General Risks and Complications: These are associated to most interventional treatments. They can occur alone, or in combination. They fall under one of the following six (6) categories: no benefit or worsening of symptoms; bleeding; infection; nerve damage; allergic reactions; and/or death. No benefits or worsening of symptoms: In Medicine there are no guarantees, only probabilities. No healthcare provider can ever guarantee that a medical treatment will work, they can only state the probability that it may. Furthermore, there is always the possibility that the condition may worsen, either directly, or indirectly, as a consequence of the treatment. Bleeding: This is more common if the patient is taking a blood thinner, either prescription or over the counter (example: Goody Powders, Fish oil, Aspirin, Garlic, etc.), or if suffering a condition associated with impaired coagulation (example: Hemophilia, cirrhosis of the liver, low platelet counts, etc.). However, even if you do not have one on these, it can still happen. If you have any of these conditions, or take one of these drugs, make sure to notify your treating physician. Infection: This is more common in patients with a compromised immune system, either due to disease (example: diabetes, cancer, human immunodeficiency virus [HIV], etc.), or due to medications or treatments (example: therapies used to treat cancer and rheumatological diseases). However, even if you do not have one on these, it can still happen. If you have any of these conditions, or take one of these drugs, make sure to notify  your treating physician. Nerve Damage: This is more common when the treatment is an invasive one, but it can also happen with the use of medications, such as those used in the treatment of cancer. The damage can occur to small secondary nerves, or to large primary ones, such as those in the spinal cord and brain. This damage may be temporary or permanent and it may lead to impairments that can range from temporary numbness to permanent paralysis and/or brain death. Allergic Reactions: Any time a substance or material comes in contact with our body, there is the possibility of an allergic reaction. These can range from a mild skin rash (contact dermatitis) to a severe systemic reaction (anaphylactic reaction), which can result in death. Death: In general, any medical intervention can result in death, most of the time due to an unforeseen complication. ______________________________________________________________________      ______________________________________________________________________    Blood Thinners  IMPORTANT NOTICE:  If you take any of these, make sure to notify the nursing staff.  Failure to do so may result in serious injury.  Recommended time intervals to stop and restart blood-thinners, before & after invasive procedures  Generic Name Brand Name Pre-procedure: Stop medication for this amount of time before your procedure: Post-procedure: Wait this amount of time after the procedure before restarting your medication:  Abciximab Reopro 15 days 2 hrs  Alteplase Activase 10 days 10 days  Anagrelide Agrylin    Apixaban Eliquis 3 days 6 hrs  Cilostazol Pletal 3 days 5 hrs  Clopidogrel Plavix 7-10 days 2 hrs  Dabigatran Pradaxa 5 days 6 hrs  Dalteparin Fragmin 24 hours 4 hrs  Dipyridamole Aggrenox 11days 2  hrs  Edoxaban [place] 3 days 2 hrs  Enoxaparin  Lovenox 24 hours 4 hrs  Eptifibatide Integrillin 8 hours 2 hrs  Fondaparinux  Arixtra 72 hours 12 hrs   Hydroxychloroquine Plaquenil 11 days   Prasugrel Effient 7-10 days 6 hrs  Reteplase Retavase 10 days 10 days  Rivaroxaban Xarelto 3 days 6 hrs  Ticagrelor Brilinta 5-7 days 6 hrs  Ticlopidine Ticlid 10-14 days 2 hrs  Tinzaparin Innohep 24 hours 4 hrs  Tirofiban Aggrastat 8 hours 2 hrs  Warfarin Coumadin 5 days 2 hrs   Other medications with blood-thinning effects  Product indications Generic (Brand) names Note  Cholesterol Lipitor Stop 4 days before procedure  Blood thinner (injectable) Heparin (LMW or LMWH Heparin) Stop 24 hours before procedure  Cancer Ibrutinib (Imbruvica) Stop 7 days before procedure  Malaria/Rheumatoid Hydroxychloroquine (Plaquenil) Stop 11 days before procedure  Thrombolytics  10 days before or after procedures   Over-the-counter (OTC) Products with blood-thinning effects  Product Common names Stop Time  Aspirin > 325 mg Goody Powders, Excedrin, etc. 11 days  Aspirin <= 81 mg  7 days  Fish oil  4 days  Garlic supplements  7 days  Ginkgo biloba  36 hours  Ginseng  24 hours  NSAIDs Ibuprofen, Naprosyn, etc. 3 days  Vitamin E  4 days   ______________________________________________________________________

## 2023-02-19 NOTE — Progress Notes (Signed)
Safety precautions to be maintained throughout the outpatient stay will include: orient to surroundings, keep bed in low position, maintain call bell within reach at all times, provide assistance with transfer out of bed and ambulation.  

## 2023-03-05 NOTE — Patient Instructions (Addendum)
 ______________________________________________________________________    Post-Radiofrequency (RF) Discharge Instructions  You have just completed a Radiofrequency Neurotomy.  The following instructions will provide you with information and guidelines for self-care upon discharge.  If at any time you have questions or concerns please call your physician. DO NOT DRIVE YOURSELF!!  Instructions: Apply ice: Fill a plastic sandwich bag with crushed ice. Cover it with a small towel and apply to injection site. Apply for 15 minutes then remove x 15 minutes. Repeat sequence on day of procedure, until you go to bed. The purpose is to minimize swelling and discomfort after procedure. Apply heat: Apply heat to procedure site starting the day following the procedure. The purpose is to treat any soreness and discomfort from the procedure. Food intake: No eating limitations, unless stipulated above.  Nevertheless, if you have had sedation, you may experience some nausea.  In this case, it may be wise to wait at least two hours prior to resuming regular diet. Physical activities: Keep activities to a minimum for the first 8 hours after the procedure. For the first 24 hours after the procedure, do not drive a motor vehicle,  Operate heavy machinery, power tools, or handle any weapons.  Consider walking with the use of an assistive device or accompanied by an adult for the first 24 hours.  Do not drink alcoholic beverages including beer.  Do not make any important decisions or sign any legal documents. Go home and rest today.  Resume activities tomorrow, as tolerated.  Use caution in moving about as you may experience mild leg weakness.  Use caution in cooking, use of household electrical appliances and climbing steps. Driving: If you have received any sedation, you are not allowed to drive for 24 hours after your procedure. Blood thinner: Restart your blood thinner 6 hours after your procedure. (Only for those taking  blood thinners) Insulin: As soon as you can eat, you may resume your normal dosing schedule. (Only for those taking insulin) Medications: May resume pre-procedure medications.  Do not take any drugs, other than what has been prescribed to you. Infection prevention: Keep procedure site clean and dry. Post-procedure Pain Diary: Extremely important that this be done correctly and accurately. Recorded information will be used to determine the next step in treatment. Pain evaluated is that of treated area only. Do not include pain from an untreated area. Complete every hour, on the hour, for the initial 8 hours. Set an alarm to help you do this part accurately. Do not go to sleep and have it completed later. It will not be accurate. Follow-up appointment: Keep your follow-up appointment after the procedure. Usually 2-6 weeks after radiofrequency. Bring you pain diary. The information collected will be essential for your long-term care.   Expect: From numbing medicine (AKA: Local Anesthetics): Numbness or decrease in pain. Onset: Full effect within 15 minutes of injected. Duration: It will depend on the type of local anesthetic used. On the average, 1 to 8 hours.  From steroids (when added): Decrease in swelling or inflammation. Once inflammation is improved, relief of the pain will follow. Onset of benefits: Depends on the amount of swelling present. The more swelling, the longer it will take for the benefits to be seen. In some cases, up to 10 days. Duration: Steroids will stay in the system x 2 weeks. Duration of benefits will depend on multiple posibilities including persistent irritating factors. From procedure: Some discomfort is to be expected once the numbing medicine wears off. In the case of  radiofrequency procedures, this may last as long as 6 weeks. Additional post-procedure pain medication is provided for this. Discomfort is minimized if ice and heat are applied as instructed.  Call  if: You experience numbness and weakness that gets worse with time, as opposed to wearing off. He experience any unusual bleeding, difficulty breathing, or loss of the ability to control your bowel and bladder. (This applies to Spinal procedures only) You experience any redness, swelling, heat, red streaks, elevated temperature, fever, or any other signs of a possible infection.  Emergency Numbers: Durning business hours (Monday - Thursday, 8:00 AM - 4:00 PM) (Friday, 9:00 AM - 12:00 Noon): [phone] After hours: [phone] ______________________________________________________________________     ______________________________________________________________________    Procedure instructions  Stop blood-thinners  Do not eat or drink fluids (other than water) for 6 hours before your procedure  No water for 2 hours before your procedure  Take your blood pressure medicine with a sip of water  Arrive 30 minutes before your appointment  If sedation is planned, bring suitable driver. Pennie Banter, Benedetto Goad, & public transportation are NOT APPROVED)  Carefully read the "Preparing for your procedure" detailed instructions  If you have questions call us at [phone]  Procedure appointments are for procedures only. NO medication refills or new problem evaluations.   ______________________________________________________________________      ______________________________________________________________________    Preparing for your procedure  Appointments: If you think you may not be able to keep your appointment, call 24-48 hours in advance to cancel. We need time to make it available to others.  Procedure visits are for procedures only. During your procedure appointment there will be: NO Prescription Refills*. NO medication changes or discussions*. NO discussion of disability issues*. NO unrelated pain problem evaluations*. NO evaluations to order other pain  procedures*. *These will be addressed at a separate and distinct evaluation encounter on the provider's evaluation schedule and not during procedure days.  Instructions: Food intake: Avoid eating anything solid for at least 8 hours prior to your procedure. Clear liquid intake: You may take clear liquids such as water up to 2 hours prior to your procedure. (No carbonated drinks. No soda.) Transportation: Unless otherwise stated by your physician, bring a driver. (Driver cannot be a Market researcher, Pharmacist, community, or any other form of public transportation.) Morning Medicines: Except for blood thinners, take all of your other morning medications with a sip of water. Make sure to take your heart and blood pressure medicines. If your blood pressure's lower number is above 100, the case will be rescheduled. Blood thinners: Make sure to stop your blood thinners as instructed.  If you take a blood thinner, but were not instructed to stop it, call our office [phone] and ask to talk to a nurse. Not stopping a blood thinner prior to certain procedures could lead to serious complications. Diabetics on insulin: Notify the staff so that you can be scheduled 1st case in the morning. If your diabetes requires high dose insulin, take only  of your normal insulin dose the morning of the procedure and notify the staff that you have done so. Preventing infections: Shower with an antibacterial soap the morning of your procedure.  Build-up your immune system: Take 1000 mg of Vitamin C with every meal (3 times a day) the day prior to your procedure. Antibiotics: Inform the nursing staff if you are taking any antibiotics or if you have any conditions that may require antibiotics prior to procedures. (Example: recent joint implants)  Pregnancy: If you are pregnant make sure to notify the nursing staff. Not doing so may result in injury to the fetus, including death.  Sickness: If you have a cold, fever, or any active infections, call  and cancel or reschedule your procedure. Receiving steroids while having an infection may result in complications. Arrival: You must be in the facility at least 30 minutes prior to your scheduled procedure. Tardiness: Your scheduled time is also the cutoff time. If you do not arrive at least 15 minutes prior to your procedure, you will be rescheduled.  Children: Do not bring any children with you. Make arrangements to keep them home. Dress appropriately: There is always a possibility that your clothing may get soiled. Avoid long dresses. Valuables: Do not bring any jewelry or valuables.  Reasons to call and reschedule or cancel your procedure: (Following these recommendations will minimize the risk of a serious complication.) Surgeries: Avoid having procedures within 2 weeks of any surgery. (Avoid for 2 weeks before or after any surgery). Flu Shots: Avoid having procedures within 2 weeks of a flu shots or . (Avoid for 2 weeks before or after immunizations). Barium: Avoid having a procedure within 7-10 days after having had a radiological study involving the use of radiological contrast. (Myelograms, Barium swallow or enema study). Heart attacks: Avoid any elective procedures or surgeries for the initial 6 months after a "Myocardial Infarction" (Heart Attack). Blood thinners: It is imperative that you stop these medications before procedures. Let us know if you if you take any blood thinner.  Infection: Avoid procedures during or within two weeks of an infection (including chest colds or gastrointestinal problems). Symptoms associated with infections include: Localized redness, fever, chills, night sweats or profuse sweating, burning sensation when voiding, cough, congestion, stuffiness, runny nose, sore throat, diarrhea, nausea, vomiting, cold or Flu symptoms, recent or current infections. It is specially important if the infection is over the area that we intend to treat. Heart and lung problems:  Symptoms that may suggest an active cardiopulmonary problem include: cough, chest pain, breathing difficulties or shortness of breath, dizziness, ankle swelling, uncontrolled high or unusually low blood pressure, and/or palpitations. If you are experiencing any of these symptoms, cancel your procedure and contact your primary care physician for an evaluation.  Remember:  Regular Business hours are:  Monday to Thursday 8:00 AM to 4:00 PM  Provider's Schedule: Delano Metz, MD:  Procedure days: Tuesday and Thursday 7:30 AM to 4:00 PM  Edward Jolly, MD:  Procedure days: Monday and Wednesday 7:30 AM to 4:00 PM Last  Updated: 01/07/2023 ______________________________________________________________________      ______________________________________________________________________    General Risks and Possible Complications  Patient Responsibilities: It is important that you read this as it is part of your informed consent. It is our duty to inform you of the risks and possible complications associated with treatments offered to you. It is your responsibility as a patient to read this and to ask questions about anything that is not clear or that you believe was not covered in this document.  Patient's Rights: You have the right to refuse treatment. You also have the right to change your mind, even after initially having agreed to have the treatment done. However, under this last option, if you wait until the last second to change your mind, you may be charged for the materials used up to that point.  Introduction: Medicine is not an Visual merchandiser. Everything in Medicine, including the lack of treatment(s), carries the potential  for danger, harm, or loss (which is by definition: Risk). In Medicine, a complication is a secondary problem, condition, or disease that can aggravate an already existing one. All treatments carry the risk of possible complications. The fact that a side effects or  complications occurs, does not imply that the treatment was conducted incorrectly. It must be clearly understood that these can happen even when everything is done following the highest safety standards.  No treatment: You can choose not to proceed with the proposed treatment alternative. The "PRO(s)" would include: avoiding the risk of complications associated with the therapy. The "CON(s)" would include: not getting any of the treatment benefits. These benefits fall under one of three categories: diagnostic; therapeutic; and/or palliative. Diagnostic benefits include: getting information which can ultimately lead to improvement of the disease or symptom(s). Therapeutic benefits are those associated with the successful treatment of the disease. Finally, palliative benefits are those related to the decrease of the primary symptoms, without necessarily curing the condition (example: decreasing the pain from a flare-up of a chronic condition, such as incurable terminal cancer).  General Risks and Complications: These are associated to most interventional treatments. They can occur alone, or in combination. They fall under one of the following six (6) categories: no benefit or worsening of symptoms; bleeding; infection; nerve damage; allergic reactions; and/or death. No benefits or worsening of symptoms: In Medicine there are no guarantees, only probabilities. No healthcare provider can ever guarantee that a medical treatment will work, they can only state the probability that it may. Furthermore, there is always the possibility that the condition may worsen, either directly, or indirectly, as a consequence of the treatment. Bleeding: This is more common if the patient is taking a blood thinner, either prescription or over the counter (example: Goody Powders, Fish oil, Aspirin, Garlic, etc.), or if suffering a condition associated with impaired coagulation (example: Hemophilia, cirrhosis of the liver, low  platelet counts, etc.). However, even if you do not have one on these, it can still happen. If you have any of these conditions, or take one of these drugs, make sure to notify your treating physician. Infection: This is more common in patients with a compromised immune system, either due to disease (example: diabetes, cancer, human immunodeficiency virus [HIV], etc.), or due to medications or treatments (example: therapies used to treat cancer and rheumatological diseases). However, even if you do not have one on these, it can still happen. If you have any of these conditions, or take one of these drugs, make sure to notify your treating physician. Nerve Damage: This is more common when the treatment is an invasive one, but it can also happen with the use of medications, such as those used in the treatment of cancer. The damage can occur to small secondary nerves, or to large primary ones, such as those in the spinal cord and brain. This damage may be temporary or permanent and it may lead to impairments that can range from temporary numbness to permanent paralysis and/or brain death. Allergic Reactions: Any time a substance or material comes in contact with our body, there is the possibility of an allergic reaction. These can range from a mild skin rash (contact dermatitis) to a severe systemic reaction (anaphylactic reaction), which can result in death. Death: In general, any medical intervention can result in death, most of the time due to an unforeseen complication. ______________________________________________________________________

## 2023-03-05 NOTE — Progress Notes (Signed)
 PROVIDER NOTE: Interpretation of information contained herein should be left to medically-trained personnel. Specific patient instructions are provided elsewhere under Patient Instructions section of medical record. This document was created in part using STT-dictation technology, any transcriptional errors that may result from this process are unintentional.  Patient: [name] Type: Established DOB: 03/14/1945 MRN: [id number] PCP: Sharon Charlene CROME, MD  Service: Procedure DOS: 03/06/2023 Setting: Ambulatory Location: Ambulatory outpatient facility Delivery: Face-to-face Provider: Eric DELENA Como, MD Specialty: Interventional Pain Management Specialty designation: 09 Location: Outpatient facility Ref. Prov.: Hamrick, Charlene CROME, MD       Interventional Therapy   Procedure: Lumbar Facet, Medial Branch Radiofrequency Ablation (RFA) #4  Laterality: Left (-LT)  Level: L2, L3, L4, L5, and S1 Medial Branch Level(s). These levels will denervate the L3-4, L4-5, and L5-S1 lumbar facet joints.  Imaging: Fluoroscopy-guided Spinal ([id number]) Anesthesia: Local anesthesia (1-2% Lidocaine ) Anxiolysis: IV Versed  2.0 mg Sedation: Moderate Sedation Fentanyl  1 mL (50 mcg) DOS: 03/06/2023  Performed by: Eric DELENA Como, MD  Purpose: Therapeutic/Palliative Indications: Low back pain severe enough to impact quality of life or function. Indications: 1. Chronic low back pain (1ry area of Pain) (Bilateral) (L>R) w/o sciatica   2. Degeneration of intervertebral disc of lumbar region with discogenic back pain   3. Grade 2 Anterolisthesis of L4/L5 (8-11 mm w/ dynamic instability) and L5-S1 (2 mm) (Stable)   4. Failed back surgical syndrome   5. Lumbar facet joint pain   6. Lumbar facet hypertrophy (L4-5)   7. Lumbar facet arthropathy (HCC)   8. Lumbar facet syndrome (Bilateral) (L>R)   9. Spondylosis without myelopathy or radiculopathy, lumbosacral region   10. Spondylosis without myelopathy  or radiculopathy, lumbar region    [name] has been dealing with the above chronic pain for longer than three months and has either failed to respond, was unable to tolerate, or simply did not get enough benefit from other more conservative therapies including, but not limited to: 1. Over-the-counter medications 2. Anti-inflammatory medications 3. Muscle relaxants 4. Membrane stabilizers 5. Opioids 6. Physical therapy and/or chiropractic manipulation 7. Modalities (Heat, ice, etc.) 8. Invasive techniques such as nerve blocks. [name] has attained more than 50% relief of the pain from a series of diagnostic injections conducted in separate occasions.  Pain Score: Pre-procedure: 7 /10 Post-procedure: 0-No pain/10     Position / Prep / Materials:  Position: Prone  Prep solution: ChloraPrep (2% chlorhexidine  gluconate and 70% isopropyl alcohol) Prep Area: Entire Lumbosacral Region (Lower back from mid-thoracic region to end of tailbone and from flank to flank.) Materials:  Tray: RFA (Radiofrequency) tray Needle(s):  Type: RFA (Teflon-coated radiofrequency ablation needles) Gauge (G): 20  Length: Long (15cm) Qty: 5      H&P (Pre-op Assessment):  [name] is a 78 y.o. (year old), female patient, seen today for interventional treatment. She  has a past surgical history that includes Vaginal hysterectomy (1975); Incontinence surgery (2001); Oophorectomy (1978); Elbow Arthroplasty (1990); Cholecystectomy (1989); Rectocele repair (2009); back implant (2015); Mastectomy partial / lumpectomy (Bilateral, 1978); Augmentation mammaplasty (Bilateral, 1978); Colonoscopy with propofol  (N/A, 01/29/2017); Spinal cord stimulator implant; Esophagogastroduodenoscopy (egd) with propofol  (N/A, 11/07/2017); Breast reconstruction; Lumbar spinal cord simulator revision (Left, 06/28/2021); and Total knee arthroplasty (Left, 06/27/2022). [name] has a current medication list which includes the  following prescription(s): albuterol , alprazolam , vitamin c, aspirin , atenolol , calcium  carbonate, calcium -vitamin d , vitamin d -3, cinnamon, citalopram , docusate sodium , estradiol , ezetimibe , fenofibrate , flax oil-fish oil-borage oil, fluticasone -salmeterol, garlic, hydrochlorothiazide , ketoconazole , levothyroxine , magnesium , magnesium   oxide, meclizine , melatonin, montelukast , omeprazole, ondansetron , oxycodone -acetaminophen , [START ON 03/13/2023] oxycodone -acetaminophen , ropinirole , sucralfate , tramadol , vitamin d -vitamin k, zinc  gluconate, zolpidem , and cyclobenzaprine , and the following Facility-Administered Medications: fentanyl . Her primarily concern today is the Back Pain  Initial Vital Signs:  Pulse/HCG Rate: 68ECG Heart Rate: 69 Temp: (!) 97.5 F (36.4 C) Resp: 16 BP: (!) 155/111 SpO2: 98 %  BMI: Estimated body mass index is 36.28 kg/m as calculated from the following:   Height as of this encounter: 5' 1 (1.549 m).   Weight as of this encounter: 192 lb (87.1 kg).  Risk Assessment: Allergies: Reviewed. She is allergic to aspirin , morphine, vicodin [hydrocodone -acetaminophen ], niacin and related, and penicillins.  Allergy Precautions: None required Coagulopathies: Reviewed. None identified.  Blood-thinner therapy: None at this time Active Infection(s): Reviewed. None identified. [name] is afebrile  Site Confirmation: [name] was asked to confirm the procedure and laterality before marking the site Procedure checklist: Completed Consent: Before the procedure and under the influence of no sedative(s), amnesic(s), or anxiolytics, the patient was informed of the treatment options, risks and possible complications. To fulfill our ethical and legal obligations, as recommended by the American Medical Association's Code of Ethics, I have informed the patient of my clinical impression; the nature and purpose of the treatment or procedure; the risks, benefits, and possible  complications of the intervention; the alternatives, including doing nothing; the risk(s) and benefit(s) of the alternative treatment(s) or procedure(s); and the risk(s) and benefit(s) of doing nothing. The patient was provided information about the general risks and possible complications associated with the procedure. These may include, but are not limited to: failure to achieve desired goals, infection, bleeding, organ or nerve damage, allergic reactions, paralysis, and death. In addition, the patient was informed of those risks and complications associated to Spine-related procedures, such as failure to decrease pain; infection (i.e.: Meningitis, epidural or intraspinal abscess); bleeding (i.e.: epidural hematoma, subarachnoid hemorrhage, or any other type of intraspinal or peri-dural bleeding); organ or nerve damage (i.e.: Any type of peripheral nerve, nerve root, or spinal cord injury) with subsequent damage to sensory, motor, and/or autonomic systems, resulting in permanent pain, numbness, and/or weakness of one or several areas of the body; allergic reactions; (i.e.: anaphylactic reaction); and/or death. Furthermore, the patient was informed of those risks and complications associated with the medications. These include, but are not limited to: allergic reactions (i.e.: anaphylactic or anaphylactoid reaction(s)); adrenal axis suppression; blood sugar elevation that in diabetics may result in ketoacidosis or comma; water  retention that in patients with history of congestive heart failure may result in shortness of breath, pulmonary edema, and decompensation with resultant heart failure; weight gain; swelling or edema; medication-induced neural toxicity; particulate matter embolism and blood vessel occlusion with resultant organ, and/or nervous system infarction; and/or aseptic necrosis of one or more joints. Finally, the patient was informed that Medicine is not an exact science; therefore, there is also  the possibility of unforeseen or unpredictable risks and/or possible complications that may result in a catastrophic outcome. The patient indicated having understood very clearly. We have given the patient no guarantees and we have made no promises. Enough time was given to the patient to ask questions, all of which were answered to the patient's satisfaction. Ms. Stech has indicated that she wanted to continue with the procedure. Attestation: I, the ordering provider, attest that I have discussed with the patient the benefits, risks, side-effects, alternatives, likelihood of achieving goals, and potential problems during recovery for the procedure that I have  provided informed consent. Date  Time: 03/06/2023  8:09 AM  Pre-Procedure Preparation:  Monitoring: As per clinic protocol. Respiration, ETCO2, SpO2, BP, heart rate and rhythm monitor placed and checked for adequate function Safety Precautions: Patient was assessed for positional comfort and pressure points before starting the procedure. Time-out: I initiated and conducted the Time-out before starting the procedure, as per protocol. The patient was asked to participate by confirming the accuracy of the Time Out information. Verification of the correct person, site, and procedure were performed and confirmed by me, the nursing staff, and the patient. Time-out conducted as per Joint Commission's Universal Protocol (UP.01.01.01). Time: 0841 Start Time: [phone] (had to change probes to large, unable to use regular, MD talked with patient about weight) hrs.  Description of Procedure:          Laterality: See above. Levels:  See above. Safety Precautions: Aspiration looking for blood return was conducted prior to all injections. At no point did we inject any substances, as a needle was being advanced. Before injecting, the patient was told to immediately notify me if she was experiencing any new onset of ringing in the ears, or metallic taste in  the mouth. No attempts were made at seeking any paresthesias. Safe injection practices and needle disposal techniques used. Medications properly checked for expiration dates. SDV (single dose vial) medications used. After the completion of the procedure, all disposable equipment used was discarded in the proper designated medical waste containers. Local Anesthesia: Protocol guidelines were followed. The patient was positioned over the fluoroscopy table. The area was prepped in the usual manner. The time-out was completed. The target area was identified using fluoroscopy. A 12-in long, straight, sterile hemostat was used with fluoroscopic guidance to locate the targets for each level blocked. Once located, the skin was marked with an approved surgical skin marker. Once all sites were marked, the skin (epidermis, dermis, and hypodermis), as well as deeper tissues (fat, connective tissue and muscle) were infiltrated with a small amount of a short-acting local anesthetic, loaded on a 10cc syringe with a 25G, 1.5-in  Needle. An appropriate amount of time was allowed for local anesthetics to take effect before proceeding to the next step. Technical description of process:  Radiofrequency Ablation (RFA) L2 Medial Branch Nerve RFA: The target area for the L2 medial branch is at the junction of the postero-lateral aspect of the superior articular process and the superior, posterior, and medial edge of the transverse process of L3. Under fluoroscopic guidance, a Radiofrequency needle was inserted until contact was made with os over the superior postero-lateral aspect of the pedicular shadow (target area). Sensory and motor testing was conducted to properly adjust the position of the needle. Once satisfactory placement of the needle was achieved, the numbing solution was slowly injected after negative aspiration for blood. 2.0 mL of the nerve block solution was injected without difficulty or complication. After waiting  for at least 3 minutes, the ablation was performed. Once completed, the needle was removed intact. L3 Medial Branch Nerve RFA: The target area for the L3 medial branch is at the junction of the postero-lateral aspect of the superior articular process and the superior, posterior, and medial edge of the transverse process of L4. Under fluoroscopic guidance, a Radiofrequency needle was inserted until contact was made with os over the superior postero-lateral aspect of the pedicular shadow (target area). Sensory and motor testing was conducted to properly adjust the position of the needle. Once satisfactory placement of the needle was  achieved, the numbing solution was slowly injected after negative aspiration for blood. 2.0 mL of the nerve block solution was injected without difficulty or complication. After waiting for at least 3 minutes, the ablation was performed. Once completed, the needle was removed intact. L4 Medial Branch Nerve RFA: The target area for the L4 medial branch is at the junction of the postero-lateral aspect of the superior articular process and the superior, posterior, and medial edge of the transverse process of L5. Under fluoroscopic guidance, a Radiofrequency needle was inserted until contact was made with os over the superior postero-lateral aspect of the pedicular shadow (target area). Sensory and motor testing was conducted to properly adjust the position of the needle. Once satisfactory placement of the needle was achieved, the numbing solution was slowly injected after negative aspiration for blood. 2.0 mL of the nerve block solution was injected without difficulty or complication. After waiting for at least 3 minutes, the ablation was performed. Once completed, the needle was removed intact. L5 Medial Branch Nerve RFA: The target area for the L5 medial branch is at the junction of the postero-lateral aspect of the superior articular process of S1 and the superior, posterior, and medial  edge of the sacral ala. Under fluoroscopic guidance, a Radiofrequency needle was inserted until contact was made with os over the superior postero-lateral aspect of the pedicular shadow (target area). Sensory and motor testing was conducted to properly adjust the position of the needle. Once satisfactory placement of the needle was achieved, the numbing solution was slowly injected after negative aspiration for blood. 2.0 mL of the nerve block solution was injected without difficulty or complication. After waiting for at least 3 minutes, the ablation was performed. Once completed, the needle was removed intact. S1 Medial Branch Nerve RFA: The target area for the S1 medial branch is located inferior to the junction of the S1 superior articular process and the L5 inferior articular process, posterior, inferior, and lateral to the 6 o'clock position of the L5-S1 facet joint, just superior to the S1 posterior foramen. Under fluoroscopic guidance, the Radiofrequency needle was advanced until contact was made with os over the Target area. Sensory and motor testing was conducted to properly adjust the position of the needle. Once satisfactory placement of the needle was achieved, the numbing solution was slowly injected after negative aspiration for blood. 2.0 mL of the nerve block solution was injected without difficulty or complication. After waiting for at least 3 minutes, the ablation was performed. Once completed, the needle was removed intact. Radiofrequency lesioning (ablation):  Radiofrequency Generator: Medtronic AccurianTM AG 1000 RF Generator Sensory Stimulation Parameters: 50 Hz was used to locate & identify the nerve, making sure that the needle was positioned such that there was no sensory stimulation below 0.3 V or above 0.7 V. Motor Stimulation Parameters: 2 Hz was used to evaluate the motor component. Care was taken not to lesion any nerves that demonstrated motor stimulation of the lower extremities  at an output of less than 2.5 times that of the sensory threshold, or a maximum of 2.0 V. Lesioning Technique Parameters: Standard Radiofrequency settings. (Not bipolar or pulsed.) Temperature Settings: 80 degrees C Lesioning time: 60 seconds Stationary intra-operative compliance: Compliant  Once the entire procedure was completed, the treated area was cleaned, making sure to leave some of the prepping solution back to take advantage of its long term bactericidal properties.    Illustration of the posterior view of the lumbar spine and the posterior neural structures. Laminae  of L2 through S1 are labeled. DPRL5, dorsal primary ramus of L5; DPRS1, dorsal primary ramus of S1; DPR3, dorsal primary ramus of L3; FJ, facet (zygapophyseal) joint L3-L4; I, inferior articular process of L4; LB1, lateral branch of dorsal primary ramus of L1; IAB, inferior articular branches from L3 medial branch (supplies L4-L5 facet joint); IBP, intermediate branch plexus; MB3, medial branch of dorsal primary ramus of L3; NR3, third lumbar nerve root; S, superior articular process of L5; SAB, superior articular branches from L4 (supplies L4-5 facet joint also); TP3, transverse process of L3.  Facet Joint Innervation (* possible contribution)  L1-2 T12, L1 (L2*)  Medial Branch  L2-3 L1, L2 (L3*)                     L3-4 L2, L3 (L4*)                     L4-5 L3, L4 (L5*)                     L5-S1 L4, L5, S1                        Vitals:   03/06/23 0930 03/06/23 0943 03/06/23 0953 03/06/23 1003  BP: (!) 151/76 132/79 (!) 156/95 128/85  Pulse: 71     Resp: 16 16 13 20   Temp:  (!) 97.2 F (36.2 C)  (!) 97 F (36.1 C)  TempSrc:  Temporal  Temporal  SpO2: 100% 100% 98% 95%  Weight:      Height:        Start Time: 0841 (had to change probes to large, unable to use regular, MD talked with patient about weight) hrs. End Time: 0930 hrs.  Imaging Guidance (Spinal):          Type of Imaging Technique:  Fluoroscopy Guidance (Spinal) Indication(s): Fluoroscopy guidance for needle placement to enhance accuracy in procedures requiring precise needle localization for targeted delivery of medication in or near specific anatomical locations not easily accessible without such real-time imaging assistance. Exposure Time: Please see nurses notes. Contrast: None used. Fluoroscopic Guidance: I was personally present during the use of fluoroscopy. Tunnel Vision Technique used to obtain the best possible view of the target area. Parallax error corrected before commencing the procedure. Direction-depth-direction technique used to introduce the needle under continuous pulsed fluoroscopy. Once target was reached, antero-posterior, oblique, and lateral fluoroscopic projection used confirm needle placement in all planes. Images permanently stored in EMR. Interpretation: No contrast injected. I personally interpreted the imaging intraoperatively. Adequate needle placement confirmed in multiple planes. Permanent images saved into the patient's record.  Antibiotic Prophylaxis:   Anti-infectives (From admission, onward)    None      Indication(s): None identified  Post-operative Assessment:  Post-procedure Vital Signs:  Pulse/HCG Rate: 7164 Temp: (!) 97 F (36.1 C) Resp: 20 BP: 128/85 SpO2: 95 %  EBL: None  Complications: No immediate post-treatment complications observed by team, or reported by patient.  Note: The patient tolerated the entire procedure well. A repeat set of vitals were taken after the procedure and the patient was kept under observation following institutional policy, for this type of procedure. Post-procedural neurological assessment was performed, showing return to baseline, prior to discharge. The patient was provided with post-procedure discharge instructions, including a section on how to identify potential problems. Should any problems arise concerning this procedure, the patient  was given instructions to immediately contact  us , at any time, without hesitation. In any case, we plan to contact the patient by telephone for a follow-up status report regarding this interventional procedure.  Comments:  No additional relevant information.  Plan of Care (POC)  Orders:  Orders Placed This Encounter  Procedures   Radiofrequency,Lumbar    Scheduling Instructions:     Side(s): Left-sided     Level: L3-4, L4-5, and L5-S1 Facets (L2, L3, L4, L5, and S1 Medial Branch)     Sedation: Patient's choice.     Timeframe: Today    Where will this procedure be performed?:   [facility]   Radiofrequency,Lumbar    Standing Status:   Future    Expiration Date:   06/03/2023    Scheduling Instructions:     Side(s): Right-sided     Level: L3-4, L4-5, and L5-S1 Facets (L2, L3, L4, L5, and S1 Medial Branch)     Sedation: With Sedation.     Scheduling Timeframe: 2 weeks from now    Where will this procedure be performed?:   [facility]   DG PAIN [facility] C-ARM 1-60 MIN NO REPORT    Intraoperative interpretation by procedural physician at [facility].    Standing Status:   Standing    Number of Occurrences:   1    Reason for exam::   Assistance in needle guidance and placement for procedures requiring needle placement in or near specific anatomical locations not easily accessible without such assistance.   Informed Consent Details: Physician/Practitioner Attestation; Transcribe to consent form and obtain patient signature    Nursing Order: Transcribe to consent form and obtain patient signature. Note: Always confirm laterality of pain with Ms. Bulls, before procedure.    Physician/Practitioner attestation of informed consent for procedure/surgical case:   I, the physician/practitioner, attest that I have discussed with the patient the benefits, risks, side effects, alternatives, likelihood of achieving goals and potential problems during recovery for the  procedure that I have provided informed consent.    Procedure:   Lumbar Facet Radiofrequency Ablation    Physician/Practitioner performing the procedure:   Jazae Gandolfi A. Tanya, MD    Indication/Reason:   Low Back Pain, with our without leg pain, due to Facet Joint Arthralgia (Joint Pain) known as Lumbar Facet Syndrome, secondary to Lumbar, and/or Lumbosacral Spondylosis (Arthritis of the Spine), without myelopathy or radiculopathy (Nerve Damage).   Provide equipment / supplies at bedside    Procedure tray: Radiofrequency Tray Additional material: Large hemostat (x1); Small hemostat (x1); Towels (x8); 4x4 sterile sponge pack (x1) Needle type: Teflon-coated Radiofrequency Needle (Disposable  single use) Size: Regular Quantity: 5    Standing Status:   Standing    Number of Occurrences:   1    Specify:   Radiofrequency Tray   Saline lock IV    Have LR [phone] mL available and administer at 125 mL/hr if patient becomes hypotensive.    Standing Status:   Standing    Number of Occurrences:   1   Chronic Opioid Analgesic:   No opioid analgesics prescribed by our practice.  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 MME/day: 0 mg/day.   Medications ordered for procedure: Meds ordered this encounter  Medications   lidocaine  (XYLOCAINE ) 2 % (with pres) injection 400 mg   pentafluoroprop-tetrafluoroeth (GEBAUERS) aerosol   midazolam  (VERSED ) 5 MG/5ML injection 0.5-2 mg    Make sure Flumazenil is available  in the pyxis when using this medication. If oversedation occurs, administer 0.2 mg IV over 15 sec. If after 45 sec no response, administer 0.2 mg again over 1 min; may repeat at 1 min intervals; not to exceed 4 doses (1 mg)   fentaNYL  (SUBLIMAZE ) injection 25-50 mcg    Make sure Narcan  is available in the pyxis when using this medication. In the event of respiratory depression (RR< 8/min): Titrate  NARCAN  (naloxone ) in increments of 0.1 to 0.2 mg IV at 2-3 minute intervals, until desired degree of reversal.   ropivacaine  (PF) 2 mg/mL (0.2%) (NAROPIN ) injection 9 mL   triamcinolone  acetonide (KENALOG -40) injection 40 mg   oxyCODONE -acetaminophen  (PERCOCET) 5-325 MG tablet    Sig: Take 1 tablet by mouth every 8 (eight) hours as needed for up to 7 days for severe pain (pain score 7-10). Must last 7 days.    Dispense:  21 tablet    Refill:  0    For acute post-operative pain. Not to be refilled.  Must last 7 days.   oxyCODONE -acetaminophen  (PERCOCET) 5-325 MG tablet    Sig: Take 1 tablet by mouth every 8 (eight) hours as needed for up to 7 days for severe pain (pain score 7-10). Must last 7 days.    Dispense:  21 tablet    Refill:  0    For acute post-operative pain. Not to be refilled.  Must last 7 days.   Medications administered: We administered lidocaine , pentafluoroprop-tetrafluoroeth, midazolam , ropivacaine  (PF) 2 mg/mL (0.2%), and triamcinolone  acetonide.  See the medical record for exact dosing, route, and time of administration.  Follow-up plan:   Return in about 6 weeks (around 04/17/2023) for (Face2F), (PPE).       Interventional Therapies  Risk Factors  Considerations  Medical Comorbidities:  MO (BMI>30)  CAD  HTN  COPD  BA  Anxiety/Depression  osteoporosis     Planned  Pending:   Therapeutic bilateral lumbar facet RFA #R3L4 (we will do left side first, followed by right side 2 weeks later)    Under consideration:   Diagnostic bilateral IA hip injection. Diagnostic/therapeutic right L2-3 LESI #2 Diagnostic/therapeutic L1-2 vs L4-5 LESI. Diagnostic/therapeutic bilateral L4-5 TFESI Replacement of spinal cord stimulator battery    Completed:   Diagnostic/therapeutic right lumbar facet MBB x6 (05/03/2021) (100/100/100/100)  Diagnostic/therapeutic left lumbar facet MBB x5 (05/03/2021) (100/100/100/100)  Therapeutic left lumbar facet RFA x3 (09/15/2018)  (100/100/100 x 9 days/90-100)  Therapeutic left SI RFA x1 (07/10/2017) (100/100/98/90)  Therapeutic right lumbar facet RFA x2 (09/18/2017) (100/100/50/<50)  Diagnostic right L1-2 LESI x1 (05/15/2017) (100/100/85/75-100)  Diagnostic/therapeutic left L4 TFESI x2 (02/10/2018) (100/100/25/0)  Diagnostic/therapeutic left L5 TFESI x1 (02/10/2018) (100/100/25/0)  Diagnostic right IA hip injection x1 (12/11/2017) (100/100/95/95)  Diagnostic/therapeutic right L2-3 LESI x1 (01/08/2018) (100/100/100/100)    Therapeutic  Palliative (PRN) options:   Therapeutic IA hip injection  Therapeutic lumbar facet RFA    Completed by other providers:   None reported      Recent Visits Date Type Provider Dept  02/19/23 Office Visit Tanya Glisson, MD [facility]  Showing recent visits within past 90 days and meeting all other requirements Today's Visits Date Type Provider Dept  03/06/23 Procedure visit Tanya Glisson, MD [facility]  Showing today's visits and meeting all other requirements Future Appointments Date Type Provider Dept  04/17/23 Appointment Tanya Glisson, MD [facility]  Showing future appointments within next 90 days and meeting all other requirements  Disposition: Discharge home  Discharge (Date  Time): 03/06/2023; 1010 hrs.   Primary Care Physician: Sharon Charlene CROME, MD Location: [facility] Note by: Eric DELENA Como, MD (TTS technology used. I apologize for any typographical errors that were not detected and corrected.) Date: 03/06/2023; Time: 10:40 AM  Disclaimer:  Medicine is not an visual merchandiser. The only guarantee in medicine is that nothing is guaranteed. It is important to note that the decision to proceed with this intervention was based on the information collected from the patient. The Data and conclusions were drawn from the patient's questionnaire, the interview, and the physical examination. Because  the information was provided in large part by the patient, it cannot be guaranteed that it has not been purposely or unconsciously manipulated. Every effort has been made to obtain as much relevant data as possible for this evaluation. It is important to note that the conclusions that lead to this procedure are derived in large part from the available data. Always take into account that the treatment will also be dependent on availability of resources and existing treatment guidelines, considered by other Pain Management Practitioners as being common knowledge and practice, at the time of the intervention. For Medico-Legal purposes, it is also important to point out that variation in procedural techniques and pharmacological choices are the acceptable norm. The indications, contraindications, technique, and results of the above procedure should only be interpreted and judged by a Board-Certified Interventional Pain Specialist with extensive familiarity and expertise in the same exact procedure and technique.

## 2023-03-06 ENCOUNTER — Encounter: Payer: Self-pay | Admitting: Pain Medicine

## 2023-03-06 ENCOUNTER — Ambulatory Visit
Admission: RE | Admit: 2023-03-06 | Discharge: 2023-03-06 | Disposition: A | Discharge status: Home / Self Care | Payer: Medicare HMO | Source: Ambulatory Visit | Attending: Pain Medicine | Admitting: Pain Medicine

## 2023-03-06 ENCOUNTER — Ambulatory Visit: Payer: Medicare HMO | Attending: Pain Medicine | Admitting: Pain Medicine

## 2023-03-06 VITALS — BP 128/85 | HR 71 | Temp 97.0°F | Resp 20 | Ht 61.0 in | Wt 192.0 lb

## 2023-03-06 DIAGNOSIS — G8918 Other acute postprocedural pain: Secondary | ICD-10-CM | POA: Insufficient documentation

## 2023-03-06 DIAGNOSIS — Z5189 Encounter for other specified aftercare: Secondary | ICD-10-CM | POA: Diagnosis not present

## 2023-03-06 DIAGNOSIS — M4310 Spondylolisthesis, site unspecified: Secondary | ICD-10-CM | POA: Diagnosis not present

## 2023-03-06 DIAGNOSIS — M47816 Spondylosis without myelopathy or radiculopathy, lumbar region: Secondary | ICD-10-CM | POA: Diagnosis not present

## 2023-03-06 DIAGNOSIS — M5136 Other intervertebral disc degeneration, lumbar region with discogenic back pain only: Secondary | ICD-10-CM | POA: Diagnosis not present

## 2023-03-06 DIAGNOSIS — M961 Postlaminectomy syndrome, not elsewhere classified: Secondary | ICD-10-CM | POA: Diagnosis not present

## 2023-03-06 DIAGNOSIS — G8929 Other chronic pain: Secondary | ICD-10-CM | POA: Insufficient documentation

## 2023-03-06 DIAGNOSIS — M47817 Spondylosis without myelopathy or radiculopathy, lumbosacral region: Secondary | ICD-10-CM | POA: Insufficient documentation

## 2023-03-06 DIAGNOSIS — M5459 Other low back pain: Secondary | ICD-10-CM | POA: Insufficient documentation

## 2023-03-06 DIAGNOSIS — M545 Low back pain, unspecified: Secondary | ICD-10-CM | POA: Diagnosis not present

## 2023-03-06 MED ORDER — LIDOCAINE HCL 2 % IJ SOLN
20.0000 mL | Freq: Once | INTRAMUSCULAR | Status: AC
  Administered 2023-03-06: 400 mg

## 2023-03-06 MED ORDER — TRIAMCINOLONE ACETONIDE 40 MG/ML IJ SUSP
INTRAMUSCULAR | Status: AC
  Filled 2023-03-06: qty 2

## 2023-03-06 MED ORDER — PENTAFLUOROPROP-TETRAFLUOROETH EX AERO
INHALATION_SPRAY | Freq: Once | CUTANEOUS | Status: AC
  Administered 2023-03-06: 30 via TOPICAL

## 2023-03-06 MED ORDER — FENTANYL CITRATE (PF) 100 MCG/2ML IJ SOLN: 25.0000 ug | INTRAMUSCULAR | Status: DC | PRN

## 2023-03-06 MED ORDER — FENTANYL CITRATE (PF) 100 MCG/2ML IJ SOLN
INTRAMUSCULAR | Status: AC
  Filled 2023-03-06: qty 2

## 2023-03-06 MED ORDER — MIDAZOLAM HCL 5 MG/5ML IJ SOLN
INTRAMUSCULAR | Status: AC
  Filled 2023-03-06: qty 5

## 2023-03-06 MED ORDER — LIDOCAINE HCL 2 % IJ SOLN
INTRAMUSCULAR | Status: AC
  Filled 2023-03-06: qty 20

## 2023-03-06 MED ORDER — OXYCODONE-ACETAMINOPHEN 5-325 MG PO TABS
1.0000 | ORAL_TABLET | Freq: Three times a day (TID) | ORAL | 0 refills | Status: AC | PRN
Start: 2023-03-13 — End: 2023-03-20

## 2023-03-06 MED ORDER — ROPIVACAINE HCL 2 MG/ML IJ SOLN
9.0000 mL | Freq: Once | INTRAMUSCULAR | Status: AC
  Administered 2023-03-06: 9 mL via PERINEURAL

## 2023-03-06 MED ORDER — OXYCODONE-ACETAMINOPHEN 5-325 MG PO TABS
1.0000 | ORAL_TABLET | Freq: Three times a day (TID) | ORAL | 0 refills | Status: AC | PRN
Start: 2023-03-06 — End: 2023-03-13

## 2023-03-06 MED ORDER — MIDAZOLAM HCL 5 MG/5ML IJ SOLN
0.5000 mg | Freq: Once | INTRAMUSCULAR | Status: AC
  Administered 2023-03-06: 1 mg via INTRAVENOUS

## 2023-03-06 MED ORDER — ROPIVACAINE HCL 2 MG/ML IJ SOLN
INTRAMUSCULAR | Status: AC
  Filled 2023-03-06: qty 20

## 2023-03-06 MED ORDER — TRIAMCINOLONE ACETONIDE 40 MG/ML IJ SUSP
40.0000 mg | Freq: Once | INTRAMUSCULAR | Status: AC
  Administered 2023-03-06: 40 mg

## 2023-03-07 ENCOUNTER — Telehealth: Payer: Self-pay

## 2023-03-07 NOTE — Telephone Encounter (Signed)
Called PP. Denies any needs at this time. Instructed to call if needed. 

## 2023-03-27 ENCOUNTER — Ambulatory Visit: Payer: Medicare HMO | Admitting: Pain Medicine

## 2023-03-28 ENCOUNTER — Encounter: Payer: Self-pay | Admitting: Gastroenterology

## 2023-04-10 ENCOUNTER — Ambulatory Visit
Admission: RE | Admit: 2023-04-10 | Discharge: 2023-04-10 | Disposition: A | Discharge status: Home / Self Care | Payer: Medicare HMO | Attending: Gastroenterology | Admitting: Gastroenterology

## 2023-04-10 ENCOUNTER — Ambulatory Visit: Admitting: Registered Nurse

## 2023-04-10 ENCOUNTER — Encounter: Payer: Self-pay | Admitting: Gastroenterology

## 2023-04-10 ENCOUNTER — Encounter
Admission: RE | Disposition: A | Discharge status: Home / Self Care | Payer: Self-pay | Source: Home / Self Care | Attending: Gastroenterology

## 2023-04-10 ENCOUNTER — Other Ambulatory Visit: Payer: Self-pay

## 2023-04-10 DIAGNOSIS — Z87891 Personal history of nicotine dependence: Secondary | ICD-10-CM | POA: Diagnosis not present

## 2023-04-10 DIAGNOSIS — I10 Essential (primary) hypertension: Secondary | ICD-10-CM | POA: Insufficient documentation

## 2023-04-10 DIAGNOSIS — D122 Benign neoplasm of ascending colon: Secondary | ICD-10-CM | POA: Insufficient documentation

## 2023-04-10 DIAGNOSIS — K2970 Gastritis, unspecified, without bleeding: Secondary | ICD-10-CM | POA: Insufficient documentation

## 2023-04-10 DIAGNOSIS — K640 First degree hemorrhoids: Secondary | ICD-10-CM | POA: Diagnosis not present

## 2023-04-10 DIAGNOSIS — Z860100 Personal history of colon polyps, unspecified: Secondary | ICD-10-CM | POA: Diagnosis not present

## 2023-04-10 DIAGNOSIS — I2510 Atherosclerotic heart disease of native coronary artery without angina pectoris: Secondary | ICD-10-CM | POA: Diagnosis not present

## 2023-04-10 DIAGNOSIS — D124 Benign neoplasm of descending colon: Secondary | ICD-10-CM | POA: Diagnosis not present

## 2023-04-10 DIAGNOSIS — Z79890 Hormone replacement therapy (postmenopausal): Secondary | ICD-10-CM | POA: Diagnosis not present

## 2023-04-10 DIAGNOSIS — D123 Benign neoplasm of transverse colon: Secondary | ICD-10-CM | POA: Insufficient documentation

## 2023-04-10 DIAGNOSIS — D120 Benign neoplasm of cecum: Secondary | ICD-10-CM | POA: Diagnosis not present

## 2023-04-10 DIAGNOSIS — K5730 Diverticulosis of large intestine without perforation or abscess without bleeding: Secondary | ICD-10-CM | POA: Diagnosis not present

## 2023-04-10 DIAGNOSIS — D125 Benign neoplasm of sigmoid colon: Secondary | ICD-10-CM | POA: Insufficient documentation

## 2023-04-10 DIAGNOSIS — K219 Gastro-esophageal reflux disease without esophagitis: Secondary | ICD-10-CM | POA: Insufficient documentation

## 2023-04-10 DIAGNOSIS — E039 Hypothyroidism, unspecified: Secondary | ICD-10-CM | POA: Diagnosis not present

## 2023-04-10 DIAGNOSIS — K259 Gastric ulcer, unspecified as acute or chronic, without hemorrhage or perforation: Secondary | ICD-10-CM | POA: Diagnosis not present

## 2023-04-10 DIAGNOSIS — Z1211 Encounter for screening for malignant neoplasm of colon: Secondary | ICD-10-CM | POA: Diagnosis not present

## 2023-04-10 DIAGNOSIS — K635 Polyp of colon: Secondary | ICD-10-CM | POA: Diagnosis not present

## 2023-04-10 HISTORY — DX: Chronic obstructive pulmonary disease, unspecified: J44.9

## 2023-04-10 HISTORY — DX: Unspecified asthma, uncomplicated: J45.909

## 2023-04-10 HISTORY — DX: Unspecified protein-calorie malnutrition: E46

## 2023-04-10 HISTORY — DX: Atherosclerotic heart disease of native coronary artery without angina pectoris: I25.10

## 2023-04-10 HISTORY — DX: Presence of neurostimulator: Z96.82

## 2023-04-10 HISTORY — DX: Spondylolisthesis, site unspecified: M43.10

## 2023-04-10 HISTORY — DX: Metabolic encephalopathy: G93.41

## 2023-04-10 HISTORY — DX: Spondylosis without myelopathy or radiculopathy, lumbar region: M47.816

## 2023-04-10 HISTORY — PX: POLYPECTOMY: SHX5525

## 2023-04-10 HISTORY — DX: Dysphagia, oropharyngeal phase: R13.12

## 2023-04-10 HISTORY — PX: ESOPHAGOGASTRODUODENOSCOPY (EGD) WITH PROPOFOL: SHX5813

## 2023-04-10 HISTORY — DX: Other specified disorders of bone density and structure, unspecified site: M85.80

## 2023-04-10 HISTORY — PX: COLONOSCOPY WITH PROPOFOL: SHX5780

## 2023-04-10 HISTORY — DX: Other intervertebral disc degeneration, lumbar region without mention of lumbar back pain or lower extremity pain: M51.369

## 2023-04-10 HISTORY — DX: Anemia, unspecified: D64.9

## 2023-04-10 HISTORY — DX: Spinal stenosis, lumbar region with neurogenic claudication: M48.062

## 2023-04-10 HISTORY — DX: Bipolar disorder, unspecified: F31.9

## 2023-04-10 HISTORY — DX: Other disorders of plasma-protein metabolism, not elsewhere classified: E88.09

## 2023-04-10 SURGERY — COLONOSCOPY WITH PROPOFOL
Anesthesia: General

## 2023-04-10 MED ORDER — LIDOCAINE HCL (CARDIAC) PF 100 MG/5ML IV SOSY
PREFILLED_SYRINGE | INTRAVENOUS | Status: DC | PRN
  Administered 2023-04-10: 60 mg via INTRAVENOUS

## 2023-04-10 MED ORDER — SODIUM CHLORIDE 0.9 % IV SOLN: INTRAVENOUS | Status: DC

## 2023-04-10 MED ORDER — LIDOCAINE HCL (PF) 2 % IJ SOLN
INTRAMUSCULAR | Status: AC
  Filled 2023-04-10: qty 5

## 2023-04-10 MED ORDER — PROPOFOL 10 MG/ML IV BOLUS
INTRAVENOUS | Status: DC | PRN
  Administered 2023-04-10: 50 mg via INTRAVENOUS
  Administered 2023-04-10: 100 ug/kg/min via INTRAVENOUS
  Administered 2023-04-10: 70 mg via INTRAVENOUS
  Administered 2023-04-10: 50 mg via INTRAVENOUS
  Administered 2023-04-10: 20 mg via INTRAVENOUS

## 2023-04-10 MED ORDER — PROPOFOL 1000 MG/100ML IV EMUL
INTRAVENOUS | Status: AC
  Filled 2023-04-10: qty 100

## 2023-04-10 NOTE — Anesthesia Procedure Notes (Signed)
 Procedure Name: General with mask airway Date/Time: 04/10/2023 7:36 AM  Performed by: Lily Lovings, CRNAPre-anesthesia Checklist: Patient identified, Emergency Drugs available, Suction available and Patient being monitored Patient Re-evaluated:Patient Re-evaluated prior to induction Oxygen Delivery Method: Simple face mask Preoxygenation: Pre-oxygenation with 100% oxygen Induction Type: IV induction Comments: POM
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
## 2023-04-10 NOTE — Op Note (Signed)
 [facility] Patient Name: [name] Procedure Date: 04/10/2023 7:36 AM MRN: [id number] Account #: [id number] Date of Birth: 12-29-1945 Admit Type: Outpatient Age: 78 Room: [facility] ENDO ROOM 1 Gender: Female Note Status: Finalized Instrument Name: Upper Endoscope [id number] Procedure:             Upper GI endoscopy Indications:           Suspected esophageal reflux Providers:             [name] DO, DO Referring MD:          No Local Md, MD (Referring MD) Medicines:             Monitored Anesthesia Care Complications:         No immediate complications. Estimated blood loss:                         Minimal. Procedure:             Pre-Anesthesia Assessment:                        - Prior to the procedure, a History and Physical was                         performed, and patient medications and allergies were                         reviewed. The patient is competent. The risks and                         benefits of the procedure and the sedation options and                         risks were discussed with the patient. All questions                         were answered and informed consent was obtained.                         Patient identification and proposed procedure were                         verified by the physician, the nurse, the anesthetist                         and the technician in the endoscopy suite. Mental                         Status Examination: alert and oriented. Airway                         Examination: normal oropharyngeal airway and neck                         mobility. Respiratory Examination: clear to                         auscultation. CV Examination: RRR, no murmurs, no S3  or S4. Prophylactic Antibiotics: The patient does not                         require prophylactic antibiotics. Prior                         Anticoagulants: The patient has taken no anticoagulant                          or antiplatelet agents. ASA Grade Assessment: III - A                         patient with severe systemic disease. After reviewing                         the risks and benefits, the patient was deemed in                         satisfactory condition to undergo the procedure. The                         anesthesia plan was to use monitored anesthesia care                         (MAC). Immediately prior to administration of                         medications, the patient was re-assessed for adequacy                         to receive sedatives. The heart rate, respiratory                         rate, oxygen saturations, blood pressure, adequacy of                         pulmonary ventilation, and response to care were                         monitored throughout the procedure. The physical                         status of the patient was re-assessed after the                         procedure.                        After obtaining informed consent, the endoscope was                         passed under direct vision. Throughout the procedure,                         the patient's blood pressure, pulse, and oxygen                         saturations were monitored continuously. The Endoscope  was introduced through the mouth, and advanced to the                         third part of duodenum. The upper GI endoscopy was                         accomplished without difficulty. The patient tolerated                         the procedure well. Findings:      The duodenal bulb, first portion of the duodenum, second portion of the       duodenum and third portion of the duodenum were normal. Estimated blood       loss: none.      Localized moderate inflammation characterized by erosions and erythema       was found in the gastric antrum. Biopsies were taken with a cold forceps       for Helicobacter pylori testing. Biopsies were taken with a cold  forceps       for histology. Estimated blood loss was minimal.      The exam of the stomach was otherwise normal.      The Z-line was regular. Estimated blood loss: none.      Esophagogastric landmarks were identified: the gastroesophageal junction       was found at 35 cm from the incisors.      The examined esophagus was normal. Estimated blood loss: none. Impression:            - Normal duodenal bulb, first portion of the duodenum,                         second portion of the duodenum and third portion of                         the duodenum.                        - Gastritis. Biopsied.                        - Z-line regular.                        - Esophagogastric landmarks identified.                        - Normal esophagus. Recommendation:        - Patient has a contact number available for                         emergencies. The signs and symptoms of potential                         delayed complications were discussed with the patient.                         Return to normal activities tomorrow. Written                         discharge instructions were provided to the patient.                        -  Discharge patient to home.                        - Resume previous diet.                        - Continue present medications.                        - Await pathology results.                        - Return to GI clinic as previously scheduled.                        - Proceed with colonoscopy. see report for further                         recommendations.                        - The findings and recommendations were discussed with                         the patient.                        - The findings and recommendations were discussed with                         the patient's family. Procedure Code(s):     --- Professional ---                        [phone], Esophagogastroduodenoscopy, flexible,                         transoral; with biopsy, single or  multiple Diagnosis Code(s):     --- Professional ---                        K29.70, Gastritis, unspecified, without bleeding CPT copyright 2022 American Medical Association. All rights reserved. The codes documented in this report are preliminary and upon coder review may  be revised to meet current compliance requirements. Attending Participation:      I personally performed the entire procedure. Elfredia Nevins, DO [name] DO, DO 04/10/2023 7:49:25 AM This report has been signed electronically. Number of Addenda: 0 Note Initiated On: 04/10/2023 7:36 AM Estimated Blood Loss:  Estimated blood loss was minimal.      [facility]

## 2023-04-10 NOTE — Anesthesia Postprocedure Evaluation (Signed)
 Anesthesia Post Note  Patient: [name]  Procedure(s) Performed: COLONOSCOPY WITH PROPOFOL ESOPHAGOGASTRODUODENOSCOPY (EGD) WITH PROPOFOL POLYPECTOMY  Patient location during evaluation: Endoscopy Anesthesia Type: General Level of consciousness: awake and alert Pain management: pain level controlled Vital Signs Assessment: post-procedure vital signs reviewed and stable Respiratory status: spontaneous breathing, nonlabored ventilation, respiratory function stable and patient connected to nasal cannula oxygen Cardiovascular status: blood pressure returned to baseline and stable Postop Assessment: no apparent nausea or vomiting Anesthetic complications: no   No notable events documented.   Last Vitals:  Vitals:   04/10/23 0836 04/10/23 0840  BP: 127/64 125/68  Pulse: (!) 54 (!) 55  Resp: 18 (!) 21  Temp:    SpO2: 99% 100%    Last Pain:  Vitals:   04/10/23 0820  TempSrc: Temporal  PainSc:                  Lenard Simmer

## 2023-04-10 NOTE — Op Note (Signed)
 [facility] Gastroenterology Patient Name: [name] Procedure Date: 04/10/2023 7:35 AM MRN: [id number] Account #: [id number] Date of Birth: 17-Aug-1945 Admit Type: Outpatient Age: 78 Room: [facility] ENDO ROOM 1 Gender: Female Note Status: Finalized Instrument Name: Colonoscope [id number] Procedure:             Colonoscopy Indications:           High risk colon cancer surveillance: Personal history                         of colonic polyps Providers:             [name] DO, DO Referring MD:          No Local Md, MD (Referring MD) Medicines:             Monitored Anesthesia Care Complications:         No immediate complications. Estimated blood loss:                         Minimal. Procedure:             Pre-Anesthesia Assessment:                        - Prior to the procedure, a History and Physical was                         performed, and patient medications and allergies were                         reviewed. The patient is competent. The risks and                         benefits of the procedure and the sedation options and                         risks were discussed with the patient. All questions                         were answered and informed consent was obtained.                         Patient identification and proposed procedure were                         verified by the physician, the nurse, the anesthetist                         and the technician in the endoscopy suite. Mental                         Status Examination: alert and oriented. Airway                         Examination: normal oropharyngeal airway and neck                         mobility. Respiratory Examination: clear to  auscultation. CV Examination: RRR, no murmurs, no S3                         or S4. Prophylactic Antibiotics: The patient does not                         require prophylactic antibiotics. Prior                          Anticoagulants: The patient has taken no anticoagulant                         or antiplatelet agents. ASA Grade Assessment: III - A                         patient with severe systemic disease. After reviewing                         the risks and benefits, the patient was deemed in                         satisfactory condition to undergo the procedure. The                         anesthesia plan was to use monitored anesthesia care                         (MAC). Immediately prior to administration of                         medications, the patient was re-assessed for adequacy                         to receive sedatives. The heart rate, respiratory                         rate, oxygen saturations, blood pressure, adequacy of                         pulmonary ventilation, and response to care were                         monitored throughout the procedure. The physical                         status of the patient was re-assessed after the                         procedure.                        After obtaining informed consent, the colonoscope was                         passed under direct vision. Throughout the procedure,                         the patient's blood pressure, pulse, and oxygen  saturations were monitored continuously. The                         Colonoscope was introduced through the anus and                         advanced to the the cecum, identified by appendiceal                         orifice and ileocecal valve. The colonoscopy was                         performed without difficulty. The patient tolerated                         the procedure well. The quality of the bowel                         preparation was evaluated using the BBPS [facility] Bowel                         Preparation Scale) with scores of: Right Colon = 2                         (minor amount of residual staining, small fragments of                         stool  and/or opaque liquid, but mucosa seen well),                         Transverse Colon = 2 (minor amount of residual                         staining, small fragments of stool and/or opaque                         liquid, but mucosa seen well) and Left Colon = 2                         (minor amount of residual staining, small fragments of                         stool and/or opaque liquid, but mucosa seen well). The                         total BBPS score equals 6. Fair Prep. The terminal                         ileum, ileocecal valve, appendiceal orifice, and                         rectum were photographed. Findings:      The perianal and digital rectal examinations were normal. Pertinent       negatives include normal sphincter tone.      Retroflexion in the right colon was performed.      Non-bleeding internal hemorrhoids were found during retroflexion. The  hemorrhoids were Grade I (internal hemorrhoids that do not prolapse).       Estimated blood loss: none.      Six sessile polyps were found in the descending colon (2), transverse       colon (2) and ascending colon (2). The polyps were 1 to 3 mm in size.       These polyps were removed with a jumbo cold forceps. Resection and       retrieval were complete. Estimated blood loss was minimal.      Two sessile polyps were found in the cecum. The polyps were 5 to 11 mm       in size. These polyps were removed with a cold snare. Resection and       retrieval were complete. Estimated blood loss was minimal.      The exam was otherwise without abnormality on direct and retroflexion       views. Impression:            - Non-bleeding internal hemorrhoids.                        - Six 1 to 3 mm polyps in the descending colon, in the                         transverse colon and in the ascending colon, removed                         with a jumbo cold forceps. Resected and retrieved.                        - Two 5 to 11 mm polyps in the  cecum, removed with a                         cold snare. Resected and retrieved.                        - The examination was otherwise normal on direct and                         retroflexion views. Recommendation:        - Patient has a contact number available for                         emergencies. The signs and symptoms of potential                         delayed complications were discussed with the patient.                         Return to normal activities tomorrow. Written                         discharge instructions were provided to the patient.                        - Discharge patient to home.                        - Resume previous diet.                        -  Continue present medications.                        - Await pathology results.                        - Repeat colonoscopy in 1 year for surveillance of                         multiple polyps.                        - Return to referring physician as previously                         scheduled.                        - No aspirin, ibuprofen, naproxen, or other                         non-steroidal anti-inflammatory drugs for 5 days after                         polyp removal.                        - The findings and recommendations were discussed with                         the patient.                        - The findings and recommendations were discussed with                         the patient's family. Procedure Code(s):     --- Professional ---                        [id number], Colonoscopy, flexible; with removal of                         tumor(s), polyp(s), or other lesion(s) by snare                         technique                        45380, 59, Colonoscopy, flexible; with biopsy, single                         or multiple Diagnosis Code(s):     --- Professional ---                        Z86.010, Personal history of colonic polyps                        D12.4, Benign neoplasm of  descending colon                        D12.3, Benign neoplasm of transverse colon (hepatic  flexure or splenic flexure)                        D12.2, Benign neoplasm of ascending colon                        D12.0, Benign neoplasm of cecum                        K64.0, First degree hemorrhoids CPT copyright 2022 American Medical Association. All rights reserved. The codes documented in this report are preliminary and upon coder review may  be revised to meet current compliance requirements. Attending Participation:      I personally performed the entire procedure. Elfredia Nevins, DO [name] DO, DO 04/10/2023 8:32:34 AM This report has been signed electronically. Number of Addenda: 0 Note Initiated On: 04/10/2023 7:35 AM Scope Withdrawal Time: 0 hours 20 minutes 45 seconds  Total Procedure Duration: 0 hours 27 minutes 37 seconds  Estimated Blood Loss:  Estimated blood loss was minimal.      [facility]

## 2023-04-10 NOTE — Interval H&P Note (Signed)
 History and Physical Interval Note: Preprocedure H&P from 04/10/23  was reviewed and there was no interval change after seeing and examining the patient.  Written consent was obtained from the patient after discussion of risks, benefits, and alternatives. Patient has consented to proceed with Esophagogastroduodenoscopy and Colonoscopy with possible intervention   04/10/2023 7:31 AM  [name]  has presented today for surgery, with the diagnosis of 530.81 (ICD-9-CM) - K21.9 (ICD-10-CM) - Gastroesophageal reflux disease, unspecified whether esophagitis presentV12.72 (ICD-9-CM) - Z86.010 (ICD-10-CM) - History of colon polyps.  The various methods of treatment have been discussed with the patient and family. After consideration of risks, benefits and other options for treatment, the patient has consented to  Procedure(s): COLONOSCOPY WITH PROPOFOL (N/A) ESOPHAGOGASTRODUODENOSCOPY (EGD) WITH PROPOFOL (N/A) as a surgical intervention.  The patient's history has been reviewed, patient examined, no change in status, stable for surgery.  I have reviewed the patient's chart and labs.  Questions were answered to the patient's satisfaction.     [name]

## 2023-04-10 NOTE — Transfer of Care (Signed)
 Immediate Anesthesia Transfer of Care Note  Patient: [name]  Procedure(s) Performed: COLONOSCOPY WITH PROPOFOL ESOPHAGOGASTRODUODENOSCOPY (EGD) WITH PROPOFOL POLYPECTOMY  Patient Location: Endoscopy Unit  Anesthesia Type:General  Level of Consciousness: drowsy and patient cooperative  Airway & Oxygen Therapy: Patient Spontanous Breathing  Post-op Assessment: Report given to RN and Post -op Vital signs reviewed and stable  Post vital signs: Reviewed and stable  Last Vitals:  Vitals Value Taken Time  BP    Temp    Pulse    Resp    SpO2      Last Pain:  Vitals:   04/10/23 0717  TempSrc: Temporal  PainSc: 0-No pain         Complications: No notable events documented.
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
## 2023-04-11 ENCOUNTER — Encounter: Payer: Self-pay | Admitting: Gastroenterology

## 2023-04-11 LAB — SURGICAL PATHOLOGY
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
## 2023-04-15 ENCOUNTER — Ambulatory Visit: Payer: Medicare HMO | Attending: Pain Medicine | Admitting: Pain Medicine

## 2023-04-15 ENCOUNTER — Encounter: Payer: Self-pay | Admitting: Pain Medicine

## 2023-04-15 VITALS — BP 170/97 | HR 77 | Temp 97.6°F | Resp 16 | Ht 61.0 in | Wt 195.0 lb

## 2023-04-15 DIAGNOSIS — M5459 Other low back pain: Secondary | ICD-10-CM | POA: Insufficient documentation

## 2023-04-15 DIAGNOSIS — M47816 Spondylosis without myelopathy or radiculopathy, lumbar region: Secondary | ICD-10-CM | POA: Insufficient documentation

## 2023-04-15 DIAGNOSIS — Z09 Encounter for follow-up examination after completed treatment for conditions other than malignant neoplasm: Secondary | ICD-10-CM | POA: Insufficient documentation

## 2023-04-15 DIAGNOSIS — M545 Low back pain, unspecified: Secondary | ICD-10-CM | POA: Diagnosis not present

## 2023-04-15 DIAGNOSIS — M47817 Spondylosis without myelopathy or radiculopathy, lumbosacral region: Secondary | ICD-10-CM | POA: Insufficient documentation

## 2023-04-15 DIAGNOSIS — G8929 Other chronic pain: Secondary | ICD-10-CM | POA: Insufficient documentation

## 2023-04-15 NOTE — Patient Instructions (Signed)
 Preparing for Procedure with Sedation Instructions: . Oral Intake: Do not eat or drink anything for at least 8 hours prior to your procedure. . Transportation: Public transportation is not allowed. Bring an adult driver. The driver must be physically present in our waiting room before any procedure can be started. Marland Kitchen Physical Assistance: Bring an adult capable of physically assisting you, in the event you need help. . Blood Pressure Medicine: Take your blood pressure medicine with a sip of water the morning of the procedure. . Insulin: Take only  of your normal insulin dose. . Preventing infections: Shower with an antibacterial soap the morning of your procedure. . Build-up your immune system: Take 1000 mg of Vitamin C with every meal (3 times a day) the day prior to your procedure. . Pregnancy: If you are pregnant, call and cancel the procedure. . Sickness: If you have a cold, fever, or any active infections, call and cancel the procedure. . Arrival: You must be in the facility at least 30 minutes prior to your scheduled procedure. . Children: Do not bring children with you. . Dress appropriately: Bring dark clothing that you would not mind if they get stained. . Valuables: Do not bring any jewelry or valuables. Procedure appointments are reserved for interventional treatments only. Marland Kitchen No Prescription Refills. . No medication changes will be discussed during procedure appointments. . No disability issues will be discussed. Marland Kitchen

## 2023-04-17 ENCOUNTER — Ambulatory Visit: Payer: Medicare HMO | Admitting: Pain Medicine

## 2023-04-22 ENCOUNTER — Telehealth: Payer: Self-pay

## 2023-04-22 NOTE — Telephone Encounter (Signed)
 She says she has only lost 3 pounds, but she is in so much pain she can hardly stand it. Her stimulator is turned up to 72 and she is having tingling in her legs because of it. She really wants to go ahead and get her procedure. Will Dr. Laban Emperor do this, even though she has not lost the 10 pounds required?

## 2023-04-25 DIAGNOSIS — F419 Anxiety disorder, unspecified: Secondary | ICD-10-CM | POA: Diagnosis not present

## 2023-04-25 DIAGNOSIS — J45909 Unspecified asthma, uncomplicated: Secondary | ICD-10-CM | POA: Diagnosis not present

## 2023-04-25 DIAGNOSIS — E782 Mixed hyperlipidemia: Secondary | ICD-10-CM | POA: Diagnosis not present

## 2023-04-25 DIAGNOSIS — R7303 Prediabetes: Secondary | ICD-10-CM | POA: Diagnosis not present

## 2023-04-25 DIAGNOSIS — F325 Major depressive disorder, single episode, in full remission: Secondary | ICD-10-CM | POA: Diagnosis not present

## 2023-04-25 DIAGNOSIS — E039 Hypothyroidism, unspecified: Secondary | ICD-10-CM | POA: Diagnosis not present

## 2023-04-25 DIAGNOSIS — I10 Essential (primary) hypertension: Secondary | ICD-10-CM | POA: Diagnosis not present

## 2023-04-25 DIAGNOSIS — J449 Chronic obstructive pulmonary disease, unspecified: Secondary | ICD-10-CM | POA: Diagnosis not present

## 2023-04-25 DIAGNOSIS — Z79899 Other long term (current) drug therapy: Secondary | ICD-10-CM | POA: Diagnosis not present

## 2023-04-28 ENCOUNTER — Telehealth: Payer: Self-pay | Admitting: Pain Medicine

## 2023-04-28 NOTE — Telephone Encounter (Signed)
 Returned patient phone call;  she reports that she is hurting like crazy on her lower back on the right side. She is using her stimulator but it is not helping. She has not tried tylenol because she states it does not work.  I told her that FN is not here but we can discuss with him tomorrow.  He is waiting to get her Right side RFA done.

## 2023-04-28 NOTE — Telephone Encounter (Signed)
 Patient states she is in a lot of pain and either wants some pain meds called in or wants her RFA scheduled asap. We are waiting on RFA authorization from [facility]. It was just ordered on 04-15-22. Alona Bene will be back tomorrow. Patient wants to speak with a nurse.

## 2023-05-08 ENCOUNTER — Encounter: Payer: Self-pay | Admitting: Pain Medicine

## 2023-05-08 ENCOUNTER — Ambulatory Visit
Admission: RE | Admit: 2023-05-08 | Discharge: 2023-05-08 | Disposition: A | Discharge status: Home / Self Care | Source: Ambulatory Visit | Attending: Pain Medicine | Admitting: Pain Medicine

## 2023-05-08 ENCOUNTER — Ambulatory Visit: Attending: Pain Medicine | Admitting: Pain Medicine

## 2023-05-08 VITALS — BP 142/59 | HR 72 | Temp 96.4°F | Resp 17 | Ht 61.0 in | Wt 195.0 lb

## 2023-05-08 DIAGNOSIS — M961 Postlaminectomy syndrome, not elsewhere classified: Secondary | ICD-10-CM | POA: Insufficient documentation

## 2023-05-08 DIAGNOSIS — E66812 Obesity, class 2: Secondary | ICD-10-CM | POA: Insufficient documentation

## 2023-05-08 DIAGNOSIS — M532X6 Spinal instabilities, lumbar region: Secondary | ICD-10-CM | POA: Insufficient documentation

## 2023-05-08 DIAGNOSIS — M47817 Spondylosis without myelopathy or radiculopathy, lumbosacral region: Secondary | ICD-10-CM | POA: Insufficient documentation

## 2023-05-08 DIAGNOSIS — G8918 Other acute postprocedural pain: Secondary | ICD-10-CM | POA: Insufficient documentation

## 2023-05-08 DIAGNOSIS — G8929 Other chronic pain: Secondary | ICD-10-CM | POA: Insufficient documentation

## 2023-05-08 DIAGNOSIS — Z9682 Presence of neurostimulator: Secondary | ICD-10-CM | POA: Insufficient documentation

## 2023-05-08 DIAGNOSIS — M47816 Spondylosis without myelopathy or radiculopathy, lumbar region: Secondary | ICD-10-CM | POA: Diagnosis not present

## 2023-05-08 DIAGNOSIS — M545 Low back pain, unspecified: Secondary | ICD-10-CM | POA: Diagnosis not present

## 2023-05-08 DIAGNOSIS — M4310 Spondylolisthesis, site unspecified: Secondary | ICD-10-CM | POA: Insufficient documentation

## 2023-05-08 DIAGNOSIS — M5459 Other low back pain: Secondary | ICD-10-CM | POA: Insufficient documentation

## 2023-05-08 MED ORDER — ROPIVACAINE HCL 2 MG/ML IJ SOLN
9.0000 mL | Freq: Once | INTRAMUSCULAR | Status: AC
Start: 2023-05-08 — End: 2023-05-08
  Administered 2023-05-08: 20 mL via PERINEURAL

## 2023-05-08 MED ORDER — LIDOCAINE HCL (PF) 2 % IJ SOLN
INTRAMUSCULAR | Status: AC
  Filled 2023-05-08: qty 5

## 2023-05-08 MED ORDER — OXYCODONE-ACETAMINOPHEN 5-325 MG PO TABS: 1.0000 | ORAL_TABLET | Freq: Three times a day (TID) | ORAL | 0 refills | Status: DC | PRN

## 2023-05-08 MED ORDER — LIDOCAINE HCL 2 % IJ SOLN
20.0000 mL | Freq: Once | INTRAMUSCULAR | Status: AC
Start: 2023-05-08 — End: 2023-05-08
  Administered 2023-05-08: 200 mg

## 2023-05-08 MED ORDER — MIDAZOLAM HCL 5 MG/5ML IJ SOLN
0.5000 mg | Freq: Once | INTRAMUSCULAR | Status: AC
  Administered 2023-05-08: 2 mg via INTRAVENOUS

## 2023-05-08 MED ORDER — FENTANYL CITRATE (PF) 100 MCG/2ML IJ SOLN
INTRAMUSCULAR | Status: AC
  Filled 2023-05-08: qty 2

## 2023-05-08 MED ORDER — PENTAFLUOROPROP-TETRAFLUOROETH EX AERO
INHALATION_SPRAY | Freq: Once | CUTANEOUS | Status: AC
Start: 2023-05-08 — End: 2023-05-08
  Administered 2023-05-08: 30 via TOPICAL

## 2023-05-08 MED ORDER — OXYCODONE-ACETAMINOPHEN 5-325 MG PO TABS: 1.0000 | ORAL_TABLET | Freq: Three times a day (TID) | ORAL | 0 refills | Status: AC | PRN

## 2023-05-08 MED ORDER — TRIAMCINOLONE ACETONIDE 40 MG/ML IJ SUSP
40.0000 mg | Freq: Once | INTRAMUSCULAR | Status: AC
Start: 2023-05-08 — End: 2023-05-08
  Administered 2023-05-08: 40 mg

## 2023-05-08 MED ORDER — ROPIVACAINE HCL 2 MG/ML IJ SOLN
INTRAMUSCULAR | Status: AC
Start: 2023-05-08 — End: ?
  Filled 2023-05-08: qty 20

## 2023-05-08 MED ORDER — FENTANYL CITRATE (PF) 100 MCG/2ML IJ SOLN
25.0000 ug | INTRAMUSCULAR | Status: DC | PRN
Start: 2023-05-08 — End: 2023-05-08
  Administered 2023-05-08: 50 ug via INTRAVENOUS

## 2023-05-08 MED ORDER — MIDAZOLAM HCL 5 MG/5ML IJ SOLN
INTRAMUSCULAR | Status: AC
  Filled 2023-05-08: qty 5

## 2023-05-08 MED ORDER — LIDOCAINE HCL (PF) 2 % IJ SOLN
INTRAMUSCULAR | Status: AC
  Filled 2023-05-08: qty 10

## 2023-05-08 NOTE — Progress Notes (Addendum)
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 COVID-19  respiratory failure. MME/day: 0 mg/day.    Medications ordered for procedure: Meds ordered this encounter  Medications   lidocaine (XYLOCAINE) 2 % (with pres) injection 400 mg   pentafluoroprop-tetrafluoroeth (GEBAUERS) aerosol   midazolam (VERSED) 5 MG/5ML injection 0.5-2 mg    Make sure Flumazenil is available in Sharon pyxis when using this medication. If oversedation occurs, administer 0.2 mg IV over 15 sec. If after 45 sec no response, administer 0.2 mg again over 1 min; may repeat at 1 min intervals; not to exceed 4 doses (1 mg)   fentaNYL (SUBLIMAZE) injection 25-50 mcg    Make sure Narcan is available in Sharon pyxis when using this medication. In Sharon event of respiratory depression (RR< 8/min): Titrate NARCAN (naloxone) in increments of 0.1 to 0.2 mg IV at 2-3 minute intervals, until desired degree of reversal.   ropivacaine (PF) 2 mg/mL (0.2%) (NAROPIN) injection 9 mL   triamcinolone acetonide (KENALOG-40) injection 40 mg   oxyCODONE-acetaminophen (PERCOCET) 5-325 MG tablet    Sig: Take 1 tablet by mouth every 8 (eight) hours as needed for up to 7 days for severe pain (pain score 7-10). Must last 7 days.    Dispense:  21 tablet    Refill:  0    For acute post-operative pain. Not to be refilled.  Must last 7 days.   oxyCODONE-acetaminophen (PERCOCET) 5-325 MG tablet    Sig: Take 1 tablet by mouth every 8 (eight) hours as needed for up to 7 days for severe pain (pain score 7-10). Must last 7 days.    Dispense:  21 tablet    Refill:  0    For acute post-operative pain. Not to be refilled.  Must last 7 days.   Medications administered: We administered lidocaine, pentafluoroprop-tetrafluoroeth, midazolam, fentaNYL, ropivacaine (PF) 2 mg/mL (0.2%), and triamcinolone acetonide.  See Sharon medical record for exact dosing, route, and time of administration.  Follow-up plan:   Return in about 6 weeks (around 06/19/2023) for (Face2F), (PPE).       Interventional Therapies  Risk Factors   Considerations  Medical Comorbidities:  MO (BMI>30)  CAD  HTN  COPD  BA  Anxiety/Depression  osteoporosis     Planned  Pending:   Therapeutic right lumbar facet RFA #R3 (05/08/2023)    Under consideration:   Replacement of spinal cord stimulator battery    Completed:   Diagnostic/therapeutic right lumbar facet MBB x6 (05/03/2021) (100/100/100/100)  Diagnostic/therapeutic left lumbar facet MBB x5 (05/03/2021) (100/100/100/100)  Therapeutic left lumbar facet RFA x4 (03/06/2023) (100/100/100/100)  Therapeutic left SI RFA x1 (07/10/2017) (100/100/98/90)  Therapeutic right lumbar facet RFA x2 (09/18/2017) (100/100/50/<50)  Diagnostic right L1-2 LESI x1 (05/15/2017) (100/100/85/75-100)  Diagnostic/therapeutic left L4 TFESI x2 (02/10/2018) (100/100/25/0)  Diagnostic/therapeutic left L5 TFESI x1 (02/10/2018) (100/100/25/0)  Diagnostic right IA hip injection x1 (12/11/2017) (100/100/95/95)  Diagnostic/therapeutic right L2-3 LESI x1 (01/08/2018) (100/100/100/100)    Therapeutic  Palliative (PRN) options:   Therapeutic IA hip injection  Therapeutic lumbar facet RFA    Completed by other providers:   None reported     Recent Visits Date Type Provider Dept  04/15/23 Office Visit Delano Metz, MD [facility]  03/06/23 Procedure visit Delano Metz, MD [facility]  02/19/23 Office Visit Delano Metz, MD [facility]  Showing recent visits within past 90 days and meeting all other requirements Today's Visits  Date Type Provider Dept  05/08/23 Procedure visit Delano Metz, MD [facility]  Showing today's visits and meeting all other requirements Future Appointments Date Type Provider Dept  06/19/23 Appointment Delano Metz, MD [facility]  Showing future appointments within next 90 days and meeting all other requirements  Disposition: Discharge home  Discharge (Date  Time): 05/08/2023; 1403 hrs.   Primary  Care Physician: Sharon Ravel, MD Location: [facility] Note by: Oswaldo Done, MD (TTS technology used. I apologize for any typographical errors that were not detected and corrected.) Date: 05/08/2023; Time: 2:32 PM  Disclaimer:  Medicine is not an Visual merchandiser. Sharon only guarantee in medicine is that nothing is guaranteed. It is important to note that Sharon decision to proceed with this intervention was based on Sharon information collected from Sharon [name]. Sharon Data and conclusions were drawn from Sharon [name]'s questionnaire, Sharon interview, and Sharon physical examination. Because Sharon information was provided in large part by Sharon [name], it cannot be guaranteed that it has not been purposely or unconsciously manipulated. Every effort has been made to obtain as much relevant data as possible for this evaluation. It is important to note that Sharon conclusions that lead to this procedure are derived in large part from Sharon available data. Always take into account that Sharon treatment will also be dependent on availability of resources and existing treatment guidelines, considered by other Pain Management Practitioners as being common knowledge and practice, at Sharon time of Sharon intervention. For Medico-Legal purposes, it is also important to point out that variation in procedural techniques and pharmacological choices are Sharon acceptable norm. Sharon indications, contraindications, technique, and results of Sharon above procedure should only be interpreted and judged by a Board-Certified Interventional Pain Specialist with extensive familiarity and expertise in Sharon same exact procedure and technique.

## 2023-05-08 NOTE — Progress Notes (Signed)
 Safety precautions to be maintained throughout the outpatient stay will include: orient to surroundings, keep bed in low position, maintain call bell within reach at all times, provide assistance with transfer out of bed and ambulation.

## 2023-05-08 NOTE — Patient Instructions (Addendum)
 ________  Moderate Conscious Sedation, Adult Sedation is the use of medicines to help you relax and not feel pain. Moderate conscious sedation is a type of sedation that makes you less alert than normal. You are still able to respond to instructions, touch, or both. This type of sedation is used during short medical and dental procedures. It is milder than deep sedation, which is a type of sedation you cannot be easily woken up from. It is also milder than general anesthesia, which is the use of medicines to make you fall asleep. Moderate conscious sedation lets you return to your normal activities sooner. Tell a health care provider about: Any allergies you have. All medicines you are taking, including vitamins, herbs, steroids, eye drops, creams, and over-the-counter medicines. Any problems you or family members have had with anesthesia. Any bleeding problems you have. Any surgeries you have had. Any medical conditions you have. Whether you are pregnant or may be pregnant. Any recent alcohol, tobacco, or drug use. What are the risks? Your health care provider will talk with you about risks. These may include: Oversedation. This is when you get too much medicine. Nausea or vomiting. Allergic reaction to medicines. Trouble breathing. If this happens, a breathing tube may be used. It will be removed when you can breathe better on your own. Heart trouble. Lung trouble. Emergence delirium. This is when you feel confused while the sedation wears off. This gets better with time. What happens before the procedure? When to stop eating and drinking Follow instructions from your health care provider about what you may eat and drink. These may include: 8 hours before your procedure Stop eating most foods. Do not eat meat, fried foods, or fatty foods. Eat only light foods, such as toast or crackers. All liquids are okay except energy drinks and alcohol. 6 hours before your procedure Stop  eating. Drink only clear liquids, such as water, clear fruit juice, black coffee, plain tea, and sports drinks. Do not drink energy drinks or alcohol. 2 hours before your procedure Stop drinking all liquids. You may be allowed to take medicines with small sips of water. If you do not follow your health care provider's instructions, your procedure may be delayed or canceled. Medicines Ask your health care provider about: Changing or stopping your regular medicines. These include any diabetes medicines or blood thinners you take. Taking medicines such as aspirin and ibuprofen. These medicines can thin your blood. Do not take them unless your health care provider tells you to. Taking over-the-counter medicines, vitamins, herbs, and supplements. Tests and exams You may have an exam or testing. You may have a blood or urine sample taken. General instructions Do not use any products that contain nicotine or tobacco for at least 4 weeks before the procedure. These products include cigarettes, chewing tobacco, and vaping devices, such as e-cigarettes. If you need help quitting, ask your health care provider. If you will be going home right after the procedure, plan to have a responsible adult: Take you home from the hospital or clinic. You will not be allowed to drive. Care for you for the time you are told. What happens during the procedure?  You will be given the sedative. It may be given: As a pill you can take by mouth. It can also be put into the rectum. As a spray through the nose. As an injection into muscle. As an injection into a vein through an IV. You may be given oxygen as needed. Your blood  pressure, heart rate, breathing rate, and blood oxygen level will be monitored during the procedure. The medical or dental procedure will be done. The procedure may vary among health care providers and hospitals. What happens after the procedure? Your blood pressure, heart rate, breathing  rate, and blood oxygen level will be monitored until you leave the hospital or clinic. You will get fluids through an IV as needed. Do not drive or operate machinery until your health care provider says that it is safe. This information is not intended to replace advice given to you by your health care provider. Make sure you discuss any questions you have with your health care provider. Document Revised: 07/30/2021 Document Reviewed: 07/30/2021 Elsevier Patient Education  2024 Elsevier Inc.  ______________________________________________________________    Post-Radiofrequency (RF) Discharge Instructions  You have just completed a Radiofrequency Neurotomy.  The following instructions will provide you with information and guidelines for self-care upon discharge.  If at any time you have questions or concerns please call your physician. DO NOT DRIVE YOURSELF!!  Instructions: Apply ice: Fill a plastic sandwich bag with crushed ice. Cover it with a small towel and apply to injection site. Apply for 15 minutes then remove x 15 minutes. Repeat sequence on day of procedure, until you go to bed. The purpose is to minimize swelling and discomfort after procedure. Apply heat: Apply heat to procedure site starting the day following the procedure. The purpose is to treat any soreness and discomfort from the procedure. Food intake: No eating limitations, unless stipulated above.  Nevertheless, if you have had sedation, you may experience some nausea.  In this case, it may be wise to wait at least two hours prior to resuming regular diet. Physical activities: Keep activities to a minimum for the first 8 hours after the procedure. For the first 24 hours after the procedure, do not drive a motor vehicle,  Operate heavy machinery, power tools, or handle any weapons.  Consider walking with the use of an assistive device or accompanied by an adult for the first 24 hours.  Do not drink alcoholic beverages including  beer.  Do not make any important decisions or sign any legal documents. Go home and rest today.  Resume activities tomorrow, as tolerated.  Use caution in moving about as you may experience mild leg weakness.  Use caution in cooking, use of household electrical appliances and climbing steps. Driving: If you have received any sedation, you are not allowed to drive for 24 hours after your procedure. Blood thinner: Restart your blood thinner 6 hours after your procedure. (Only for those taking blood thinners) Insulin: As soon as you can eat, you may resume your normal dosing schedule. (Only for those taking insulin) Medications: May resume pre-procedure medications.  Do not take any drugs, other than what has been prescribed to you. Infection prevention: Keep procedure site clean and dry. Post-procedure Pain Diary: Extremely important that this be done correctly and accurately. Recorded information will be used to determine the next step in treatment. Pain evaluated is that of treated area only. Do not include pain from an untreated area. Complete every hour, on the hour, for the initial 8 hours. Set an alarm to help you do this part accurately. Do not go to sleep and have it completed later. It will not be accurate. Follow-up appointment: Keep your follow-up appointment after the procedure. Usually 2-6 weeks after radiofrequency. Bring you pain diary. The information collected will be essential for your long-term care.   Expect:  From numbing medicine (AKA: Local Anesthetics): Numbness or decrease in pain. Onset: Full effect within 15 minutes of injected. Duration: It will depend on the type of local anesthetic used. On the average, 1 to 8 hours.  From steroids (when added): Decrease in swelling or inflammation. Once inflammation is improved, relief of the pain will follow. Onset of benefits: Depends on the amount of swelling present. The more swelling, the longer it will take for the benefits to be  seen. In some cases, up to 10 days. Duration: Steroids will stay in the system x 2 weeks. Duration of benefits will depend on multiple posibilities including persistent irritating factors. From procedure: Some discomfort is to be expected once the numbing medicine wears off. In the case of radiofrequency procedures, this may last as long as 6 weeks. Additional post-procedure pain medication is provided for this. Discomfort is minimized if ice and heat are applied as instructed.  Call if: You experience numbness and weakness that gets worse with time, as opposed to wearing off. He experience any unusual bleeding, difficulty breathing, or loss of the ability to control your bowel and bladder. (This applies to Spinal procedures only) You experience any redness, swelling, heat, red streaks, elevated temperature, fever, or any other signs of a possible infection.  Emergency Numbers: Durning business hours (Monday - Thursday, 8:00 AM - 4:00 PM) (Friday, 9:00 AM - 12:00 Noon): [phone] After hours: [phone] ______________________________________________________________________     ______________________________________________________________________    Patient information on: Body mass index (BMI) and Weight Management  Dear [name] you are receiving this information because your weight may be adversely affecting your health.   Your current Estimated body mass index is 36.84 kg/m as calculated from the following:   Height as of 04/15/23: 5\' 1"  (1.549 m).   Weight as of 04/15/23: 195 lb (88.5 kg).  We recommend you talk to your primary care physician about providing or referring you to a supervised weight management program.  Here is some information about weight and the body mass index (BMI) classification:  BMI is a measure of obesity that's calculated by dividing a person's weight in kilograms by their height in meters squared. A person can use an online calculator to  determine their BMI. Body mass index (BMI) is a common tool for deciding whether a person has an appropriate body weight.  It measures a person's weight in relation to their height.  According to the [facility] of health (NIH): A BMI of less than 18.5 means that a person is underweight. A BMI of between 18.5 and 24.9 is ideal. A BMI of between 25 and 29.9 is overweight. A BMI over 30 indicates obesity.  Body Mass Index (BMI) Classification BMI level (kg/m2) Category Associated incidence of chronic pain  <18  Underweight   18.5-24.9 Ideal body weight   25-29.9 Overweight  20%  30-34.9 Obese (Class I)  68%  35-39.9 Severe obesity (Class II)  136%  >40 Extreme obesity (Class III)  254%    Morbidly Obese Classification: You will be considered to be "Morbidly Obese" if your BMI is above 30 and you have one or more of the following conditions caused or associated to obesity: 1.    Type 2 Diabetes (Leading to cardiovascular diseases (CVD), stroke, peripheral vascular diseases (PVD), retinopathy, nephropathy, and neuropathy) 2.    Cardiovascular Disease (High Blood Pressure; Congestive Heart Failure; High Cholesterol; Coronary Artery Disease; Angina; Arrhythmias, Dysrhythmias, or Heart Attacks) 3.    Breathing problems (  Asthma; obesity-hypoventilation syndrome; obstructive sleep apnea; chronic inflammatory airway disease; reactive airway disease; or shortness of breath) 4.    Chronic kidney disease 5.    Liver disease (nonalcoholic fatty liver disease) 6.    High blood pressure 7.    Acid reflux (gastroesophageal reflux disease; heartburn) 8.    Osteoarthritis (OA) (affecting the hip(s), the knee(s) and/or the lower back) (usually requiring knee and/or hip replacements, as well as back surgeries) 9.    Low back pain (Lumbar Facet Syndrome; and/or Degenerative Disc Disease) 10.  Hip pain (Osteoarthritis of hip) (For every 1 lbs of added body weight, there is a 2 lbs increase in pressure  inside of each hip articulation. 1:2 mechanical relationship) 11.  Knee pain (Osteoarthritis of knee) (For every 1 lbs of added body weight, there is a 4 lbs increase in pressure inside of each knee articulation. 1:4 mechanical relationship) (patients with a BMI>30 kg/m2 were 6.8 times more likely to develop knee OA than normal-weight individuals) 12.  Cancer: Epidemiological studies have shown that obesity is a risk factor for: post-menopausal breast cancer; cancers of the endometrium, colon and kidney cancer; malignant adenomas of the esophagus. Obese subjects have an approximately 1.5-3.5-fold increased risk of developing these cancers compared with normal-weight subjects, and it has been estimated that between 15 and 45% of these cancers can be attributed to overweight. More recent studies suggest that obesity may also increase the risk of other types of cancer, including pancreatic, hepatic and gallbladder cancer. (Ref: Obesity and cancer. Pischon T, Nthlings U, [employer] H. Proc Nutr Soc. 2008 May;67(2):128-45. doi: 10.1017/S0029665108006976.) The International Agency for Research on Cancer (IARC) has identified 13 cancers associated with overweight and obesity: meningioma, multiple myeloma, adenocarcinoma of the esophagus, and cancers of the thyroid, postmenopausal breast cancer, gallbladder, stomach, liver, pancreas, kidney, ovaries, uterus, colon and rectal (colorectal) cancers. 55 percent of all cancers diagnosed in women and 24 percent of those diagnosed in men are associated with overweight and obesity.  Recommendation: If you have any of the above conditions it is urgent that you take a step back and concentrate in losing weight. Dedicate 100% of your efforts on this task. Nothing else will improve your health more than bringing your weight down and your BMI to less than 30.   Nutritionist and/or supervised weight-management program: We are aware that most chronic pain patients are unable to  exercise secondary to their pain. For this reason, you must rely on proper nutrition and diet in order to lose the weight. We recommend you talk to a nutritionist.   Bariatric surgery: A person might be considered a candidate for bariatric surgery if they meet one of the following BMI criteria:  BMI of 40 or higher: This is considered extreme obesity (Class III). BMI of 35-39.9: This is considered obesity, and the person might also have a serious weight-related health condition, such as high blood pressure, type 2 diabetes, or severe sleep apnea  BMI of 30-34.9: This might be considered if the person has serious weight-related health problems and hasn't had substantial weight loss or improvement in co-morbidities through other methods   On your own: A realistic goal is to lose 10% of your body weight over a period of 12 months.  If over a period of six (6) months you have unsuccessfully tried to lose weight, then it is time for you to seek professional help and to enter a medically supervised weight management program, and/or undergo bariatric surgery.   Pain management considerations  and possible limitations:  1.    Pharmacological Problems: Be advised that the use of opioid analgesics (oxycodone; hydrocodone; morphine; methadone; codeine; and all of their derivatives) have been associated with decreased metabolism and weight gain.  For this reason, should we see that you are unable to lose weight while taking these medications, it may become necessary for [place] to taper down and indefinitely discontinue them.  2.    Technical Problems: The incidence of successful interventional therapies decreases as the patient's BMI increases. It is much more difficult to accomplish a safe and effective interventional therapy on a patient with a BMI above 35. 3.    Radiation Exposure Problems: The x-rays machine, used to accomplish injection therapies, will automatically increase their x-ray output in order to capture  an appropriate bone image. This means that radiation exposure increases exponentially with the patient's BMI. (The higher the BMI, the higher the radiation exposure.) Although the level of radiation used at a given time is still safe to the patient, it is not for the physician and/or assisting staff. Unfortunately, radiation exposure is accumulative. Because physicians and the staff have to do procedures and be exposed on a daily basis, this can result in health problems such as cancer and radiation burns. Radiation exposure to the staff is monitored by the radiation batches that they wear. The exposure levels are reported back to the staff on a quarterly basis. Depending on levels of exposure, physicians and staff may be obligated by law to decrease this exposure. This means that they have the right and obligation to refuse providing therapies where they may be overexposed to radiation. For this reason, physicians may decline to offer therapies such as radiofrequency ablation or implants to patients with a BMI above 40. 4.    Current Trends: Be advised that the current trend is to no longer offer certain therapies to patients with a BMI equal to, or above 35, due to increase perioperative risks, increased technical procedural difficulties, and excessive radiation exposure to healthcare personnel.  Last updated: 10/21/2022 ______________________________________________________________________    For a more information on how diet can influence inflammation, please visit: [email or web address] NOTE: We are not associated or profit from this web site.

## 2023-05-15 ENCOUNTER — Ambulatory Visit: Admitting: Pain Medicine

## 2023-05-21 ENCOUNTER — Telehealth: Payer: Self-pay | Admitting: Pain Medicine

## 2023-05-21 NOTE — Telephone Encounter (Signed)
 PT called stated that she was told by Barth Borne to call to get refill on Oxycodone . Please give patient a call. TY

## 2023-05-26 ENCOUNTER — Other Ambulatory Visit: Payer: Self-pay

## 2023-05-26 DIAGNOSIS — G8918 Other acute postprocedural pain: Secondary | ICD-10-CM

## 2023-05-26 MED ORDER — OXYCODONE-ACETAMINOPHEN 5-325 MG PO TABS: 1.0000 | ORAL_TABLET | Freq: Three times a day (TID) | ORAL | 0 refills | Status: AC | PRN
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
## 2023-06-18 ENCOUNTER — Encounter: Payer: Self-pay | Admitting: Pain Medicine

## 2023-06-18 ENCOUNTER — Ambulatory Visit: Attending: Pain Medicine | Admitting: Pain Medicine

## 2023-06-18 VITALS — BP 151/78 | HR 68 | Temp 98.6°F | Ht 61.0 in | Wt 190.0 lb

## 2023-06-18 DIAGNOSIS — M4310 Spondylolisthesis, site unspecified: Secondary | ICD-10-CM | POA: Diagnosis not present

## 2023-06-18 DIAGNOSIS — M5459 Other low back pain: Secondary | ICD-10-CM | POA: Diagnosis not present

## 2023-06-18 DIAGNOSIS — M47816 Spondylosis without myelopathy or radiculopathy, lumbar region: Secondary | ICD-10-CM | POA: Diagnosis not present

## 2023-06-18 DIAGNOSIS — M545 Low back pain, unspecified: Secondary | ICD-10-CM | POA: Insufficient documentation

## 2023-06-18 DIAGNOSIS — Z09 Encounter for follow-up examination after completed treatment for conditions other than malignant neoplasm: Secondary | ICD-10-CM | POA: Diagnosis not present

## 2023-06-18 DIAGNOSIS — G8929 Other chronic pain: Secondary | ICD-10-CM | POA: Diagnosis not present

## 2023-06-18 NOTE — Progress Notes (Signed)
 Safety precautions to be maintained throughout the outpatient stay will include: orient to surroundings, keep bed in low position, maintain call bell within reach at all times, provide assistance with transfer out of bed and ambulation.

## 2023-06-19 ENCOUNTER — Ambulatory Visit: Admitting: Pain Medicine

## 2023-07-03 DIAGNOSIS — J45901 Unspecified asthma with (acute) exacerbation: Secondary | ICD-10-CM | POA: Diagnosis not present

## 2023-07-15 DIAGNOSIS — Z96652 Presence of left artificial knee joint: Secondary | ICD-10-CM | POA: Diagnosis not present

## 2023-07-22 DIAGNOSIS — R350 Frequency of micturition: Secondary | ICD-10-CM | POA: Diagnosis not present

## 2023-08-20 DIAGNOSIS — J069 Acute upper respiratory infection, unspecified: Secondary | ICD-10-CM | POA: Diagnosis not present

## 2023-08-20 DIAGNOSIS — R0982 Postnasal drip: Secondary | ICD-10-CM | POA: Diagnosis not present

## 2023-08-20 DIAGNOSIS — J45901 Unspecified asthma with (acute) exacerbation: Secondary | ICD-10-CM | POA: Diagnosis not present

## 2023-08-20 DIAGNOSIS — R0981 Nasal congestion: Secondary | ICD-10-CM | POA: Diagnosis not present

## 2023-08-20 DIAGNOSIS — R051 Acute cough: Secondary | ICD-10-CM | POA: Diagnosis not present

## 2023-09-01 ENCOUNTER — Other Ambulatory Visit: Payer: Self-pay | Admitting: Family Medicine

## 2023-09-01 DIAGNOSIS — Z1231 Encounter for screening mammogram for malignant neoplasm of breast: Secondary | ICD-10-CM

## 2023-09-05 DIAGNOSIS — F1320 Sedative, hypnotic or anxiolytic dependence, uncomplicated: Secondary | ICD-10-CM | POA: Diagnosis not present

## 2023-09-05 DIAGNOSIS — R7303 Prediabetes: Secondary | ICD-10-CM | POA: Diagnosis not present

## 2023-09-05 DIAGNOSIS — I10 Essential (primary) hypertension: Secondary | ICD-10-CM | POA: Diagnosis not present

## 2023-09-05 DIAGNOSIS — E782 Mixed hyperlipidemia: Secondary | ICD-10-CM | POA: Diagnosis not present

## 2023-09-05 DIAGNOSIS — Z23 Encounter for immunization: Secondary | ICD-10-CM | POA: Diagnosis not present

## 2023-09-05 DIAGNOSIS — E039 Hypothyroidism, unspecified: Secondary | ICD-10-CM | POA: Diagnosis not present

## 2023-09-05 DIAGNOSIS — F419 Anxiety disorder, unspecified: Secondary | ICD-10-CM | POA: Diagnosis not present

## 2023-09-05 DIAGNOSIS — Z79899 Other long term (current) drug therapy: Secondary | ICD-10-CM | POA: Diagnosis not present

## 2023-09-05 DIAGNOSIS — J449 Chronic obstructive pulmonary disease, unspecified: Secondary | ICD-10-CM | POA: Diagnosis not present

## 2023-09-05 DIAGNOSIS — F325 Major depressive disorder, single episode, in full remission: Secondary | ICD-10-CM | POA: Diagnosis not present

## 2023-09-05 DIAGNOSIS — J45909 Unspecified asthma, uncomplicated: Secondary | ICD-10-CM | POA: Diagnosis not present

## 2023-09-08 DIAGNOSIS — R2681 Unsteadiness on feet: Secondary | ICD-10-CM | POA: Diagnosis not present

## 2023-09-08 DIAGNOSIS — Z008 Encounter for other general examination: Secondary | ICD-10-CM | POA: Diagnosis not present

## 2023-09-08 DIAGNOSIS — E785 Hyperlipidemia, unspecified: Secondary | ICD-10-CM | POA: Diagnosis not present

## 2023-09-08 DIAGNOSIS — Z9181 History of falling: Secondary | ICD-10-CM | POA: Diagnosis not present

## 2023-09-08 DIAGNOSIS — F411 Generalized anxiety disorder: Secondary | ICD-10-CM | POA: Diagnosis not present

## 2023-09-08 DIAGNOSIS — Z79899 Other long term (current) drug therapy: Secondary | ICD-10-CM | POA: Diagnosis not present

## 2023-09-08 DIAGNOSIS — F17211 Nicotine dependence, cigarettes, in remission: Secondary | ICD-10-CM | POA: Diagnosis not present

## 2023-09-08 DIAGNOSIS — Z6836 Body mass index (BMI) 36.0-36.9, adult: Secondary | ICD-10-CM | POA: Diagnosis not present

## 2023-09-08 DIAGNOSIS — J4550 Severe persistent asthma, uncomplicated: Secondary | ICD-10-CM | POA: Diagnosis not present

## 2023-09-08 DIAGNOSIS — R002 Palpitations: Secondary | ICD-10-CM | POA: Diagnosis not present

## 2023-09-08 DIAGNOSIS — I10 Essential (primary) hypertension: Secondary | ICD-10-CM | POA: Diagnosis not present

## 2023-09-08 DIAGNOSIS — G4700 Insomnia, unspecified: Secondary | ICD-10-CM | POA: Diagnosis not present

## 2023-09-17 DIAGNOSIS — J4520 Mild intermittent asthma, uncomplicated: Secondary | ICD-10-CM | POA: Diagnosis not present

## 2023-09-17 DIAGNOSIS — R0609 Other forms of dyspnea: Secondary | ICD-10-CM | POA: Diagnosis not present

## 2023-09-17 DIAGNOSIS — R058 Other specified cough: Secondary | ICD-10-CM | POA: Diagnosis not present

## 2023-09-17 DIAGNOSIS — R053 Chronic cough: Secondary | ICD-10-CM | POA: Diagnosis not present

## 2023-09-17 DIAGNOSIS — Z1331 Encounter for screening for depression: Secondary | ICD-10-CM | POA: Diagnosis not present

## 2023-10-05 DIAGNOSIS — R3915 Urgency of urination: Secondary | ICD-10-CM | POA: Diagnosis not present

## 2023-10-05 DIAGNOSIS — N390 Urinary tract infection, site not specified: Secondary | ICD-10-CM | POA: Diagnosis not present

## 2023-10-05 DIAGNOSIS — R300 Dysuria: Secondary | ICD-10-CM | POA: Diagnosis not present

## 2023-10-05 DIAGNOSIS — R350 Frequency of micturition: Secondary | ICD-10-CM | POA: Diagnosis not present

## 2023-10-06 ENCOUNTER — Ambulatory Visit
Admission: RE | Admit: 2023-10-06 | Discharge: 2023-10-06 | Disposition: A | Discharge status: Home / Self Care | Source: Ambulatory Visit | Attending: Family Medicine | Admitting: Family Medicine

## 2023-10-06 DIAGNOSIS — Z1231 Encounter for screening mammogram for malignant neoplasm of breast: Secondary | ICD-10-CM | POA: Diagnosis not present

## 2023-10-08 DIAGNOSIS — G8929 Other chronic pain: Secondary | ICD-10-CM | POA: Diagnosis not present

## 2023-10-08 DIAGNOSIS — J45909 Unspecified asthma, uncomplicated: Secondary | ICD-10-CM | POA: Diagnosis not present

## 2023-10-08 DIAGNOSIS — Z7982 Long term (current) use of aspirin: Secondary | ICD-10-CM | POA: Diagnosis not present

## 2023-10-08 DIAGNOSIS — J449 Chronic obstructive pulmonary disease, unspecified: Secondary | ICD-10-CM | POA: Diagnosis not present

## 2023-10-08 DIAGNOSIS — Z886 Allergy status to analgesic agent status: Secondary | ICD-10-CM | POA: Diagnosis not present

## 2023-10-08 DIAGNOSIS — Z87891 Personal history of nicotine dependence: Secondary | ICD-10-CM | POA: Diagnosis not present

## 2023-10-08 DIAGNOSIS — E079 Disorder of thyroid, unspecified: Secondary | ICD-10-CM | POA: Diagnosis not present

## 2023-10-08 DIAGNOSIS — Z79891 Long term (current) use of opiate analgesic: Secondary | ICD-10-CM | POA: Diagnosis not present

## 2023-10-08 DIAGNOSIS — Z79890 Hormone replacement therapy (postmenopausal): Secondary | ICD-10-CM | POA: Diagnosis not present

## 2023-10-08 DIAGNOSIS — Z885 Allergy status to narcotic agent status: Secondary | ICD-10-CM | POA: Diagnosis not present

## 2023-10-08 DIAGNOSIS — Z7951 Long term (current) use of inhaled steroids: Secondary | ICD-10-CM | POA: Diagnosis not present

## 2023-10-08 DIAGNOSIS — Z880 Allergy status to penicillin: Secondary | ICD-10-CM | POA: Diagnosis not present

## 2023-10-08 DIAGNOSIS — J4489 Other specified chronic obstructive pulmonary disease: Secondary | ICD-10-CM | POA: Diagnosis not present

## 2023-10-08 DIAGNOSIS — Z7902 Long term (current) use of antithrombotics/antiplatelets: Secondary | ICD-10-CM | POA: Diagnosis not present

## 2023-10-08 DIAGNOSIS — G479 Sleep disorder, unspecified: Secondary | ICD-10-CM | POA: Diagnosis not present

## 2023-10-08 DIAGNOSIS — Z792 Long term (current) use of antibiotics: Secondary | ICD-10-CM | POA: Diagnosis not present

## 2023-10-08 DIAGNOSIS — Z79899 Other long term (current) drug therapy: Secondary | ICD-10-CM | POA: Diagnosis not present

## 2023-10-08 DIAGNOSIS — N3000 Acute cystitis without hematuria: Secondary | ICD-10-CM | POA: Diagnosis not present

## 2023-10-08 DIAGNOSIS — M549 Dorsalgia, unspecified: Secondary | ICD-10-CM | POA: Diagnosis not present

## 2023-10-08 DIAGNOSIS — I10 Essential (primary) hypertension: Secondary | ICD-10-CM | POA: Diagnosis not present

## 2023-10-25 DIAGNOSIS — N3000 Acute cystitis without hematuria: Secondary | ICD-10-CM | POA: Diagnosis not present

## 2023-12-10 ENCOUNTER — Ambulatory Visit

## 2023-12-10 DIAGNOSIS — W908XXA Exposure to other nonionizing radiation, initial encounter: Secondary | ICD-10-CM | POA: Diagnosis not present

## 2023-12-10 DIAGNOSIS — D489 Neoplasm of uncertain behavior, unspecified: Secondary | ICD-10-CM

## 2023-12-10 DIAGNOSIS — Z872 Personal history of diseases of the skin and subcutaneous tissue: Secondary | ICD-10-CM

## 2023-12-10 DIAGNOSIS — L570 Actinic keratosis: Secondary | ICD-10-CM | POA: Diagnosis not present

## 2023-12-10 DIAGNOSIS — L578 Other skin changes due to chronic exposure to nonionizing radiation: Secondary | ICD-10-CM | POA: Diagnosis not present

## 2023-12-10 DIAGNOSIS — Z1283 Encounter for screening for malignant neoplasm of skin: Secondary | ICD-10-CM | POA: Diagnosis not present

## 2023-12-10 DIAGNOSIS — L814 Other melanin hyperpigmentation: Secondary | ICD-10-CM

## 2023-12-10 DIAGNOSIS — D1801 Hemangioma of skin and subcutaneous tissue: Secondary | ICD-10-CM

## 2023-12-10 DIAGNOSIS — Z85828 Personal history of other malignant neoplasm of skin: Secondary | ICD-10-CM

## 2023-12-10 DIAGNOSIS — L821 Other seborrheic keratosis: Secondary | ICD-10-CM

## 2023-12-10 DIAGNOSIS — C4490 Unspecified malignant neoplasm of skin, unspecified: Secondary | ICD-10-CM

## 2023-12-10 DIAGNOSIS — D229 Melanocytic nevi, unspecified: Secondary | ICD-10-CM

## 2023-12-10 HISTORY — DX: Actinic keratosis: L57.0

## 2023-12-10 NOTE — Progress Notes (Signed)
 Subjective   [name] is a 78 y.o. female who presents for the following: Total body skin exam for skin cancer screening and mole check. The patient has spots, moles and lesions to be evaluated, some may be new or changing and the patient may have concern these could be cancer.. Patient is established patient   Today patient reports: Area of concern on face Area of concern on bilateral shoulder Area of concern on bilateral lower extremities   Review of Systems:    No other skin or systemic complaints except as noted in HPI or Assessment and Plan.  The following portions of the chart were reviewed this encounter and updated as appropriate: medications, allergies, medical history  Relevant Medical History:  Personal history of non melanoma skin cancer - see medical history for full details  and n/a   Objective  (SKPE) Well appearing patient in no apparent distress; mood and affect are within normal limits. Examination was performed of the: Full Skin Examination: scalp, head, eyes, ears, nose, lips, neck, chest, axillae, abdomen, back, buttocks, bilateral upper extremities, bilateral lower extremities, hands, feet, fingers, toes, fingernails, and toenails.   Examination notable for: SKIN EXAM, Angioma(s): Scattered red vascular papule(s)  , Lentigo/lentigines: Scattered pigmented macules that are tan to brown in color and are somewhat non-uniform in shape and concentrated in the sun-exposed areas, Nevus/nevi: Scattered well-demarcated, regular, pigmented macule(s) and/or papule(s)  , Seborrheic Keratosis(es): Stuck-on appearing keratotic papule(s) on the trunk, none  irritated with redness, crusting, edema, and/or partial avulsion, Actinic Damage/Elastosis: chronic sun damage: dyspigmentation, telangiectasia, and wrinkling, Actinic keratosis: Scaly erythematous macule(s) concentrated on sun exposed areas   Examination limited by: Undergarments and Patient deferred removal     Left  Lateral Eyebrow 6mm pink scaly plaque  Face (3) Pink scaly macules  Assessment & Plan  (SKAP)   SKIN CANCER SCREENING PERFORMED TODAY.  BENIGN SKIN FINDINGS  - Lentigines  - Seborrheic keratoses  - Hemangiomas   - Nevus/Multiple Benign Nevi - Reassurance provided regarding the benign appearance of lesions noted on exam today; no treatment is indicated in the absence of symptoms/changes. - Reinforced importance of photoprotective strategies including liberal and frequent sunscreen use of a broad-spectrum SPF 30 or greater, use of protective clothing, and sun avoidance for prevention of cutaneous malignancy and photoaging.  Counseled patient on the importance of regular self-skin monitoring as well as routine clinical skin examinations as scheduled.   ACTINIC DAMAGE - Chronic condition, secondary to cumulative UV/sun exposure - Recommend daily broad spectrum sunscreen SPF 30+ to sun-exposed areas, reapply every 2 hours as needed.  - Staying in the shade or wearing long sleeves, sun glasses (UVA+UVB protection) and wide brim hats (4-inch brim around the entire circumference of the hat) are also recommended for sun protection.  - Call for new or changing lesions.  Personal history of non melanoma skin cancer  and actinic keratosis  - Reviewed medical history for full details  - Reviewed sun protective measures as above - Encouraged full body skin exams     Level of service outlined above   Patient instructions (SKPI)   Procedures, orders, diagnosis for this visit:  NEOPLASM OF UNCERTAIN BEHAVIOR Left Lateral Eyebrow Skin / nail biopsy Type of biopsy: tangential   Informed consent: discussed and consent obtained   Timeout: patient name, date of birth, surgical site, and procedure verified   Procedure prep:  Patient was prepped and draped in usual sterile fashion Prep type:  Isopropyl alcohol  Anesthesia: the lesion was anesthetized in a standard fashion   Anesthetic:  1% lidocaine   w/ epinephrine  1-100,000 buffered w/ 8.4% NaHCO3 Instrument used: DermaBlade   Hemostasis achieved with: pressure and aluminum chloride   Outcome: patient tolerated procedure well   Post-procedure details: sterile dressing applied and wound care instructions given   Dressing type: bandage and petrolatum    Specimen 1 - Surgical pathology Differential Diagnosis: BCC vs SCC vs Other   Check Margins: No ACTINIC KERATOSIS (3) Face (3) Actinic keratoses are precancerous spots that appear secondary to cumulative UV radiation exposure/sun exposure over time. They are chronic with expected duration over 1 year. A portion of actinic keratoses will progress to squamous cell carcinoma of the skin. It is not possible to reliably predict which spots will progress to skin cancer and so treatment is recommended to prevent development of skin cancer.  Recommend daily broad spectrum sunscreen SPF 30+ to sun-exposed areas, reapply every 2 hours as needed.  Recommend staying in the shade or wearing long sleeves, sun glasses (UVA+UVB protection) and wide brim hats (4-inch brim around the entire circumference of the hat). Call for new or changing lesions. Destruction of lesion - Face (3) Complexity: simple   Destruction method: cryotherapy   Informed consent: discussed and consent obtained   Timeout:  patient name, date of birth, surgical site, and procedure verified Lesion destroyed using liquid nitrogen: Yes   Region frozen until ice ball extended beyond lesion: Yes   Cryo cycles: 1 or 2. Outcome: patient tolerated procedure well with no complications   Post-procedure details: wound care instructions given     Neoplasm of uncertain behavior -     Skin / nail biopsy -     Surgical pathology; Standing  Actinic keratosis -     Destruction of lesion    Return to clinic: Return in about 1 year (around 12/09/2024) for TBSE.  I, Emerick Ege, CMA am acting as scribe for Lauraine JAYSON Kanaris, MD.    Documentation: I have reviewed the above documentation for accuracy and completeness, and I agree with the above.  Lauraine JAYSON Kanaris, MD

## 2023-12-10 NOTE — Patient Instructions (Addendum)

## 2023-12-12 LAB — SURGICAL PATHOLOGY

## 2023-12-15 ENCOUNTER — Ambulatory Visit: Payer: Self-pay

## 2023-12-15 MED ORDER — FLUOROURACIL 5 % EX CREA: TOPICAL_CREAM | Freq: Two times a day (BID) | CUTANEOUS | 0 refills | Status: AC

## 2023-12-15 NOTE — Telephone Encounter (Signed)
-----   Message from Lauraine JAYSON Kanaris sent at 12/15/2023  8:55 AM EST -----  1. Skin, left lateral eyebrow :       HYPERTROPHIC ACTINIC KERATOSIS, CRUSTED   Please notify patient with below plan: - After discusison of options for treatment including risks, benefits and alternatives, the patient elected to treat with a course of Efudex to be applied to affected areas  - Start efudex 5% cream (5-fluorouracil) twice daily for 3-4 weeks - Discussed the longer the product is used the more effective it is - Educated on the risk of redness, irritation, pain. Advised to hold treatment if developing side effects.  - Wash hands after use - Advised sun protection and avoidance    ----- Message ----- From: Interface, [facility] Sent: 12/12/2023   5:08 PM EST To: Lauraine JAYSON Kanaris, MD

## 2023-12-15 NOTE — Telephone Encounter (Signed)
 Discussed pathology results with patient and medication sent to pharmacy.

## 2024-01-08 ENCOUNTER — Ambulatory Visit: Payer: Medicare PPO | Admitting: Dermatology

## 2024-12-09 ENCOUNTER — Ambulatory Visit
